# Patient Record
Sex: Female | Born: 1935 | Race: White | Hispanic: No | Marital: Married | Smoking: Former smoker
Health system: Southern US, Community
[De-identification: ages and names within clinical notes are randomized; demographics above are authoritative.]

## PROBLEM LIST (undated history)

## (undated) DIAGNOSIS — D649 Anemia, unspecified: Secondary | ICD-10-CM

## (undated) DIAGNOSIS — J9611 Chronic respiratory failure with hypoxia: Secondary | ICD-10-CM

## (undated) DIAGNOSIS — N183 Chronic kidney disease, stage 3 unspecified: Secondary | ICD-10-CM

## (undated) DIAGNOSIS — E559 Vitamin D deficiency, unspecified: Secondary | ICD-10-CM

## (undated) DIAGNOSIS — H3530 Unspecified macular degeneration: Secondary | ICD-10-CM

## (undated) DIAGNOSIS — N189 Chronic kidney disease, unspecified: Secondary | ICD-10-CM

## (undated) DIAGNOSIS — J90 Pleural effusion, not elsewhere classified: Secondary | ICD-10-CM

## (undated) DIAGNOSIS — I272 Pulmonary hypertension, unspecified: Secondary | ICD-10-CM

## (undated) DIAGNOSIS — I5032 Chronic diastolic (congestive) heart failure: Secondary | ICD-10-CM

## (undated) DIAGNOSIS — M1990 Unspecified osteoarthritis, unspecified site: Secondary | ICD-10-CM

## (undated) DIAGNOSIS — E785 Hyperlipidemia, unspecified: Secondary | ICD-10-CM

## (undated) DIAGNOSIS — I119 Hypertensive heart disease without heart failure: Secondary | ICD-10-CM

## (undated) DIAGNOSIS — E039 Hypothyroidism, unspecified: Secondary | ICD-10-CM

## (undated) DIAGNOSIS — Z8673 Personal history of transient ischemic attack (TIA), and cerebral infarction without residual deficits: Secondary | ICD-10-CM

## (undated) DIAGNOSIS — I050 Rheumatic mitral stenosis: Secondary | ICD-10-CM

## (undated) DIAGNOSIS — D631 Anemia in chronic kidney disease: Secondary | ICD-10-CM

## (undated) DIAGNOSIS — I639 Cerebral infarction, unspecified: Secondary | ICD-10-CM

## (undated) DIAGNOSIS — H5440 Blindness, one eye, unspecified eye: Secondary | ICD-10-CM

## (undated) DIAGNOSIS — N390 Urinary tract infection, site not specified: Secondary | ICD-10-CM

## (undated) HISTORY — PX: CATARACT EXTRACTION, BILATERAL: SHX1313

## (undated) HISTORY — DX: Vitamin D deficiency, unspecified: E55.9

## (undated) HISTORY — DX: Anemia, unspecified: D64.9

## (undated) HISTORY — DX: Hyperlipidemia, unspecified: E78.5

## (undated) HISTORY — DX: Hypothyroidism, unspecified: E03.9

---

## 2004-10-06 ENCOUNTER — Encounter: Admission: RE | Admit: 2004-10-06 | Discharge: 2004-10-06 | Payer: Self-pay | Admitting: Endocrinology

## 2004-10-21 ENCOUNTER — Encounter: Admission: RE | Admit: 2004-10-21 | Discharge: 2004-10-21 | Payer: Self-pay | Admitting: Endocrinology

## 2005-01-18 ENCOUNTER — Ambulatory Visit (HOSPITAL_COMMUNITY): Admission: RE | Admit: 2005-01-18 | Discharge: 2005-01-18 | Payer: Self-pay | Admitting: Endocrinology

## 2005-05-14 ENCOUNTER — Emergency Department (HOSPITAL_COMMUNITY): Admission: EM | Admit: 2005-05-14 | Discharge: 2005-05-15 | Payer: Self-pay | Admitting: Family Medicine

## 2006-05-29 ENCOUNTER — Encounter: Admission: RE | Admit: 2006-05-29 | Discharge: 2006-05-29 | Payer: Self-pay | Admitting: Endocrinology

## 2006-12-30 ENCOUNTER — Encounter
Admission: RE | Admit: 2006-12-30 | Discharge: 2006-12-30 | Payer: Self-pay | Admitting: Physical Medicine and Rehabilitation

## 2007-07-02 ENCOUNTER — Inpatient Hospital Stay (HOSPITAL_COMMUNITY): Admission: EM | Admit: 2007-07-02 | Discharge: 2007-07-03 | Payer: Self-pay | Admitting: Emergency Medicine

## 2007-07-02 ENCOUNTER — Ambulatory Visit: Payer: Self-pay | Admitting: Cardiology

## 2007-07-03 ENCOUNTER — Encounter (INDEPENDENT_AMBULATORY_CARE_PROVIDER_SITE_OTHER): Payer: Self-pay | Admitting: Neurology

## 2007-07-03 ENCOUNTER — Ambulatory Visit: Payer: Self-pay | Admitting: Vascular Surgery

## 2007-10-04 ENCOUNTER — Encounter: Admission: RE | Admit: 2007-10-04 | Discharge: 2007-10-04 | Payer: Self-pay | Admitting: Endocrinology

## 2007-10-28 ENCOUNTER — Encounter (INDEPENDENT_AMBULATORY_CARE_PROVIDER_SITE_OTHER): Payer: Self-pay | Admitting: Specialist

## 2007-10-28 ENCOUNTER — Ambulatory Visit: Payer: Self-pay | Admitting: Vascular Surgery

## 2007-10-28 ENCOUNTER — Ambulatory Visit (HOSPITAL_COMMUNITY): Admission: RE | Admit: 2007-10-28 | Discharge: 2007-10-28 | Payer: Self-pay | Admitting: Specialist

## 2007-11-02 ENCOUNTER — Emergency Department (HOSPITAL_COMMUNITY): Admission: EM | Admit: 2007-11-02 | Discharge: 2007-11-02 | Payer: Self-pay | Admitting: Emergency Medicine

## 2008-07-12 ENCOUNTER — Ambulatory Visit: Payer: Self-pay | Admitting: Internal Medicine

## 2008-07-12 ENCOUNTER — Inpatient Hospital Stay (HOSPITAL_COMMUNITY): Admission: EM | Admit: 2008-07-12 | Discharge: 2008-07-20 | Payer: Self-pay | Admitting: Emergency Medicine

## 2008-07-12 ENCOUNTER — Encounter: Payer: Self-pay | Admitting: Emergency Medicine

## 2008-10-26 ENCOUNTER — Encounter: Admission: RE | Admit: 2008-10-26 | Discharge: 2008-10-26 | Payer: Self-pay | Admitting: Endocrinology

## 2008-11-12 ENCOUNTER — Inpatient Hospital Stay (HOSPITAL_COMMUNITY): Admission: EM | Admit: 2008-11-12 | Discharge: 2008-11-25 | Payer: Self-pay | Admitting: Emergency Medicine

## 2008-11-12 ENCOUNTER — Ambulatory Visit: Payer: Self-pay | Admitting: Vascular Surgery

## 2008-11-13 ENCOUNTER — Encounter (INDEPENDENT_AMBULATORY_CARE_PROVIDER_SITE_OTHER): Payer: Self-pay | Admitting: Internal Medicine

## 2009-07-16 ENCOUNTER — Ambulatory Visit (HOSPITAL_COMMUNITY): Admission: RE | Admit: 2009-07-16 | Discharge: 2009-07-16 | Payer: Self-pay | Admitting: Endocrinology

## 2009-10-27 ENCOUNTER — Encounter: Admission: RE | Admit: 2009-10-27 | Discharge: 2009-10-27 | Payer: Self-pay | Admitting: Endocrinology

## 2009-11-02 ENCOUNTER — Encounter: Admission: RE | Admit: 2009-11-02 | Discharge: 2009-11-02 | Payer: Self-pay | Admitting: Endocrinology

## 2010-08-21 LAB — GLUCOSE, CAPILLARY
Glucose-Capillary: 100 mg/dL — ABNORMAL HIGH (ref 70–99)
Glucose-Capillary: 102 mg/dL — ABNORMAL HIGH (ref 70–99)
Glucose-Capillary: 106 mg/dL — ABNORMAL HIGH (ref 70–99)
Glucose-Capillary: 106 mg/dL — ABNORMAL HIGH (ref 70–99)
Glucose-Capillary: 107 mg/dL — ABNORMAL HIGH (ref 70–99)
Glucose-Capillary: 107 mg/dL — ABNORMAL HIGH (ref 70–99)
Glucose-Capillary: 108 mg/dL — ABNORMAL HIGH (ref 70–99)
Glucose-Capillary: 114 mg/dL — ABNORMAL HIGH (ref 70–99)
Glucose-Capillary: 116 mg/dL — ABNORMAL HIGH (ref 70–99)
Glucose-Capillary: 118 mg/dL — ABNORMAL HIGH (ref 70–99)
Glucose-Capillary: 119 mg/dL — ABNORMAL HIGH (ref 70–99)
Glucose-Capillary: 124 mg/dL — ABNORMAL HIGH (ref 70–99)
Glucose-Capillary: 137 mg/dL — ABNORMAL HIGH (ref 70–99)
Glucose-Capillary: 144 mg/dL — ABNORMAL HIGH (ref 70–99)
Glucose-Capillary: 146 mg/dL — ABNORMAL HIGH (ref 70–99)
Glucose-Capillary: 158 mg/dL — ABNORMAL HIGH (ref 70–99)
Glucose-Capillary: 164 mg/dL — ABNORMAL HIGH (ref 70–99)
Glucose-Capillary: 167 mg/dL — ABNORMAL HIGH (ref 70–99)
Glucose-Capillary: 67 mg/dL — ABNORMAL LOW (ref 70–99)
Glucose-Capillary: 70 mg/dL (ref 70–99)
Glucose-Capillary: 71 mg/dL (ref 70–99)
Glucose-Capillary: 72 mg/dL (ref 70–99)
Glucose-Capillary: 74 mg/dL (ref 70–99)
Glucose-Capillary: 75 mg/dL (ref 70–99)
Glucose-Capillary: 76 mg/dL (ref 70–99)
Glucose-Capillary: 80 mg/dL (ref 70–99)
Glucose-Capillary: 80 mg/dL (ref 70–99)
Glucose-Capillary: 81 mg/dL (ref 70–99)
Glucose-Capillary: 81 mg/dL (ref 70–99)
Glucose-Capillary: 82 mg/dL (ref 70–99)
Glucose-Capillary: 83 mg/dL (ref 70–99)
Glucose-Capillary: 87 mg/dL (ref 70–99)
Glucose-Capillary: 93 mg/dL (ref 70–99)
Glucose-Capillary: 96 mg/dL (ref 70–99)
Glucose-Capillary: 99 mg/dL (ref 70–99)

## 2010-08-21 LAB — CBC
HCT: 26.3 % — ABNORMAL LOW (ref 36.0–46.0)
HCT: 26.3 % — ABNORMAL LOW (ref 36.0–46.0)
HCT: 28.1 % — ABNORMAL LOW (ref 36.0–46.0)
HCT: 30.2 % — ABNORMAL LOW (ref 36.0–46.0)
Hemoglobin: 10 g/dL — ABNORMAL LOW (ref 12.0–15.0)
Hemoglobin: 8.3 g/dL — ABNORMAL LOW (ref 12.0–15.0)
Hemoglobin: 8.6 g/dL — ABNORMAL LOW (ref 12.0–15.0)
Hemoglobin: 8.7 g/dL — ABNORMAL LOW (ref 12.0–15.0)
Hemoglobin: 8.9 g/dL — ABNORMAL LOW (ref 12.0–15.0)
Hemoglobin: 9.6 g/dL — ABNORMAL LOW (ref 12.0–15.0)
MCHC: 32.7 g/dL (ref 30.0–36.0)
MCHC: 32.8 g/dL (ref 30.0–36.0)
MCHC: 32.8 g/dL (ref 30.0–36.0)
MCHC: 32.9 g/dL (ref 30.0–36.0)
MCHC: 33 g/dL (ref 30.0–36.0)
MCHC: 33 g/dL (ref 30.0–36.0)
MCHC: 33 g/dL (ref 30.0–36.0)
MCHC: 33.2 g/dL (ref 30.0–36.0)
MCHC: 33.2 g/dL (ref 30.0–36.0)
MCV: 95.4 fL (ref 78.0–100.0)
MCV: 95.5 fL (ref 78.0–100.0)
MCV: 95.6 fL (ref 78.0–100.0)
MCV: 95.8 fL (ref 78.0–100.0)
MCV: 96.4 fL (ref 78.0–100.0)
MCV: 96.7 fL (ref 78.0–100.0)
MCV: 96.7 fL (ref 78.0–100.0)
MCV: 97 fL (ref 78.0–100.0)
MCV: 97.1 fL (ref 78.0–100.0)
MCV: 97.3 fL (ref 78.0–100.0)
MCV: 97.8 fL (ref 78.0–100.0)
MCV: 97.8 fL (ref 78.0–100.0)
Platelets: 176 10*3/uL (ref 150–400)
Platelets: 185 10*3/uL (ref 150–400)
Platelets: 189 10*3/uL (ref 150–400)
Platelets: 198 10*3/uL (ref 150–400)
Platelets: 231 10*3/uL (ref 150–400)
Platelets: ADEQUATE 10*3/uL (ref 150–400)
RBC: 2.51 MIL/uL — ABNORMAL LOW (ref 3.87–5.11)
RBC: 2.59 MIL/uL — ABNORMAL LOW (ref 3.87–5.11)
RBC: 2.6 MIL/uL — ABNORMAL LOW (ref 3.87–5.11)
RBC: 2.68 MIL/uL — ABNORMAL LOW (ref 3.87–5.11)
RBC: 2.69 MIL/uL — ABNORMAL LOW (ref 3.87–5.11)
RBC: 2.73 MIL/uL — ABNORMAL LOW (ref 3.87–5.11)
RBC: 2.8 MIL/uL — ABNORMAL LOW (ref 3.87–5.11)
RBC: 2.84 MIL/uL — ABNORMAL LOW (ref 3.87–5.11)
RBC: 3.17 MIL/uL — ABNORMAL LOW (ref 3.87–5.11)
RDW: 16.9 % — ABNORMAL HIGH (ref 11.5–15.5)
RDW: 17.3 % — ABNORMAL HIGH (ref 11.5–15.5)
RDW: 17.4 % — ABNORMAL HIGH (ref 11.5–15.5)
RDW: 17.5 % — ABNORMAL HIGH (ref 11.5–15.5)
RDW: 17.6 % — ABNORMAL HIGH (ref 11.5–15.5)
RDW: 17.7 % — ABNORMAL HIGH (ref 11.5–15.5)
RDW: 17.8 % — ABNORMAL HIGH (ref 11.5–15.5)
WBC: 4.4 10*3/uL (ref 4.0–10.5)
WBC: 4.9 10*3/uL (ref 4.0–10.5)
WBC: 5.2 10*3/uL (ref 4.0–10.5)
WBC: 5.7 10*3/uL (ref 4.0–10.5)
WBC: 5.9 10*3/uL (ref 4.0–10.5)
WBC: 6 10*3/uL (ref 4.0–10.5)
WBC: 6.6 10*3/uL (ref 4.0–10.5)

## 2010-08-21 LAB — DIFFERENTIAL
Basophils Absolute: 0 10*3/uL (ref 0.0–0.1)
Basophils Absolute: 0 10*3/uL (ref 0.0–0.1)
Basophils Relative: 1 % (ref 0–1)
Basophils Relative: 1 % (ref 0–1)
Eosinophils Absolute: 0 10*3/uL (ref 0.0–0.7)
Eosinophils Absolute: 0.3 10*3/uL (ref 0.0–0.7)
Eosinophils Relative: 6 % — ABNORMAL HIGH (ref 0–5)
Lymphocytes Relative: 14 % (ref 12–46)
Lymphocytes Relative: 23 % (ref 12–46)
Lymphs Abs: 0.7 10*3/uL (ref 0.7–4.0)
Monocytes Absolute: 0.9 10*3/uL (ref 0.1–1.0)
Monocytes Relative: 18 % — ABNORMAL HIGH (ref 3–12)
Neutro Abs: 3 10*3/uL (ref 1.7–7.7)
Neutrophils Relative %: 62 % (ref 43–77)
Neutrophils Relative %: 71 % (ref 43–77)

## 2010-08-21 LAB — BLOOD GAS, ARTERIAL
Acid-Base Excess: 3.3 mmol/L — ABNORMAL HIGH (ref 0.0–2.0)
Acid-Base Excess: 7.6 mmol/L — ABNORMAL HIGH (ref 0.0–2.0)
Bicarbonate: 31.1 mEq/L — ABNORMAL HIGH (ref 20.0–24.0)
Drawn by: 103701
Drawn by: 295031
FIO2: 0.21 %
FIO2: 0.35 %
FIO2: 0.35 %
O2 Saturation: 86.2 %
O2 Saturation: 96.5 %
Patient temperature: 98.6
TCO2: 29.6 mmol/L (ref 0–100)
TCO2: 31.9 mmol/L (ref 0–100)
pCO2 arterial: 55.5 mmHg — ABNORMAL HIGH (ref 35.0–45.0)
pCO2 arterial: 56.5 mmHg — ABNORMAL HIGH (ref 35.0–45.0)
pCO2 arterial: 59.9 mmHg (ref 35.0–45.0)
pH, Arterial: 7.299 — ABNORMAL LOW (ref 7.350–7.400)
pO2, Arterial: 113 mmHg — ABNORMAL HIGH (ref 80.0–100.0)
pO2, Arterial: 79.1 mmHg — ABNORMAL LOW (ref 80.0–100.0)
pO2, Arterial: 80.1 mmHg (ref 80.0–100.0)

## 2010-08-21 LAB — BASIC METABOLIC PANEL
BUN: 19 mg/dL (ref 6–23)
BUN: 19 mg/dL (ref 6–23)
BUN: 19 mg/dL (ref 6–23)
BUN: 20 mg/dL (ref 6–23)
BUN: 20 mg/dL (ref 6–23)
BUN: 21 mg/dL (ref 6–23)
BUN: 21 mg/dL (ref 6–23)
BUN: 23 mg/dL (ref 6–23)
BUN: 26 mg/dL — ABNORMAL HIGH (ref 6–23)
BUN: 29 mg/dL — ABNORMAL HIGH (ref 6–23)
CO2: 25 mEq/L (ref 19–32)
CO2: 27 mEq/L (ref 19–32)
CO2: 28 mEq/L (ref 19–32)
CO2: 28 mEq/L (ref 19–32)
CO2: 29 mEq/L (ref 19–32)
CO2: 29 mEq/L (ref 19–32)
CO2: 30 mEq/L (ref 19–32)
CO2: 31 mEq/L (ref 19–32)
CO2: 31 mEq/L (ref 19–32)
Calcium: 8.8 mg/dL (ref 8.4–10.5)
Calcium: 8.8 mg/dL (ref 8.4–10.5)
Calcium: 9 mg/dL (ref 8.4–10.5)
Calcium: 9.2 mg/dL (ref 8.4–10.5)
Calcium: 9.2 mg/dL (ref 8.4–10.5)
Calcium: 9.3 mg/dL (ref 8.4–10.5)
Calcium: 9.3 mg/dL (ref 8.4–10.5)
Calcium: 9.4 mg/dL (ref 8.4–10.5)
Chloride: 104 mEq/L (ref 96–112)
Chloride: 105 mEq/L (ref 96–112)
Chloride: 105 mEq/L (ref 96–112)
Chloride: 105 mEq/L (ref 96–112)
Chloride: 106 mEq/L (ref 96–112)
Chloride: 107 mEq/L (ref 96–112)
Chloride: 107 mEq/L (ref 96–112)
Chloride: 107 mEq/L (ref 96–112)
Chloride: 107 mEq/L (ref 96–112)
Chloride: 109 mEq/L (ref 96–112)
Chloride: 109 mEq/L (ref 96–112)
Creatinine, Ser: 1.05 mg/dL (ref 0.4–1.2)
Creatinine, Ser: 1.11 mg/dL (ref 0.4–1.2)
Creatinine, Ser: 1.16 mg/dL (ref 0.4–1.2)
Creatinine, Ser: 1.29 mg/dL — ABNORMAL HIGH (ref 0.4–1.2)
Creatinine, Ser: 1.31 mg/dL — ABNORMAL HIGH (ref 0.4–1.2)
Creatinine, Ser: 1.33 mg/dL — ABNORMAL HIGH (ref 0.4–1.2)
Creatinine, Ser: 1.33 mg/dL — ABNORMAL HIGH (ref 0.4–1.2)
Creatinine, Ser: 1.34 mg/dL — ABNORMAL HIGH (ref 0.4–1.2)
Creatinine, Ser: 1.35 mg/dL — ABNORMAL HIGH (ref 0.4–1.2)
Creatinine, Ser: 1.36 mg/dL — ABNORMAL HIGH (ref 0.4–1.2)
Creatinine, Ser: 1.39 mg/dL — ABNORMAL HIGH (ref 0.4–1.2)
GFR calc Af Amer: 44 mL/min — ABNORMAL LOW (ref >60)
GFR calc Af Amer: 45 mL/min — ABNORMAL LOW (ref >60)
GFR calc Af Amer: 46 mL/min — ABNORMAL LOW (ref >60)
GFR calc Af Amer: 46 mL/min — ABNORMAL LOW (ref >60)
GFR calc Af Amer: 48 mL/min — ABNORMAL LOW (ref >60)
GFR calc Af Amer: 48 mL/min — ABNORMAL LOW (ref >60)
GFR calc Af Amer: 57 mL/min — ABNORMAL LOW (ref >60)
GFR calc Af Amer: >60 mL/min (ref >60)
GFR calc non Af Amer: 36 mL/min — ABNORMAL LOW (ref >60)
GFR calc non Af Amer: 37 mL/min — ABNORMAL LOW (ref >60)
GFR calc non Af Amer: 38 mL/min — ABNORMAL LOW (ref >60)
GFR calc non Af Amer: 38 mL/min — ABNORMAL LOW (ref >60)
GFR calc non Af Amer: 39 mL/min — ABNORMAL LOW (ref >60)
GFR calc non Af Amer: 40 mL/min — ABNORMAL LOW (ref >60)
GFR calc non Af Amer: 47 mL/min — ABNORMAL LOW (ref >60)
GFR calc non Af Amer: 50 mL/min — ABNORMAL LOW (ref >60)
Glucose, Bld: 110 mg/dL — ABNORMAL HIGH (ref 70–99)
Glucose, Bld: 114 mg/dL — ABNORMAL HIGH (ref 70–99)
Glucose, Bld: 119 mg/dL — ABNORMAL HIGH (ref 70–99)
Glucose, Bld: 120 mg/dL — ABNORMAL HIGH (ref 70–99)
Glucose, Bld: 125 mg/dL — ABNORMAL HIGH (ref 70–99)
Glucose, Bld: 73 mg/dL (ref 70–99)
Glucose, Bld: 80 mg/dL (ref 70–99)
Glucose, Bld: 95 mg/dL (ref 70–99)
Glucose, Bld: 96 mg/dL (ref 70–99)
Glucose, Bld: 99 mg/dL (ref 70–99)
Potassium: 3.5 mEq/L (ref 3.5–5.1)
Potassium: 3.6 mEq/L (ref 3.5–5.1)
Potassium: 3.6 mEq/L (ref 3.5–5.1)
Potassium: 3.8 mEq/L (ref 3.5–5.1)
Potassium: 3.9 mEq/L (ref 3.5–5.1)
Potassium: 4.1 mEq/L (ref 3.5–5.1)
Potassium: 4.7 mEq/L (ref 3.5–5.1)
Sodium: 139 mEq/L (ref 135–145)
Sodium: 140 mEq/L (ref 135–145)
Sodium: 141 mEq/L (ref 135–145)
Sodium: 142 mEq/L (ref 135–145)
Sodium: 142 mEq/L (ref 135–145)
Sodium: 143 mEq/L (ref 135–145)
Sodium: 144 mEq/L (ref 135–145)
Sodium: 144 mEq/L (ref 135–145)

## 2010-08-21 LAB — CARDIAC PANEL(CRET KIN+CKTOT+MB+TROPI)
CK, MB: 4.9 ng/mL — ABNORMAL HIGH (ref 0.3–4.0)
CK, MB: 5.3 ng/mL — ABNORMAL HIGH (ref 0.3–4.0)
Relative Index: INVALID (ref 0.0–2.5)
Total CK: 35 U/L (ref 7–177)
Troponin I: 0.01 ng/mL (ref 0.00–0.06)
Troponin I: 0.03 ng/mL (ref 0.00–0.06)

## 2010-08-21 LAB — CULTURE, BLOOD (ROUTINE X 2)
Culture: NO GROWTH
Culture: NO GROWTH
Culture: NO GROWTH
Culture: NO GROWTH

## 2010-08-21 LAB — URINALYSIS, ROUTINE W REFLEX MICROSCOPIC
Glucose, UA: NEGATIVE mg/dL
Hgb urine dipstick: NEGATIVE
Ketones, ur: NEGATIVE mg/dL
Protein, ur: NEGATIVE mg/dL

## 2010-08-21 LAB — URINE MICROSCOPIC-ADD ON

## 2010-08-21 LAB — COMPREHENSIVE METABOLIC PANEL
ALT: 33 U/L (ref 0–35)
AST: 50 U/L — ABNORMAL HIGH (ref 0–37)
Albumin: 3.2 g/dL — ABNORMAL LOW (ref 3.5–5.2)
Albumin: 3.2 g/dL — ABNORMAL LOW (ref 3.5–5.2)
BUN: 44 mg/dL — ABNORMAL HIGH (ref 6–23)
Calcium: 9.6 mg/dL (ref 8.4–10.5)
Chloride: 104 mEq/L (ref 96–112)
Creatinine, Ser: 1.13 mg/dL (ref 0.4–1.2)
Creatinine, Ser: 1.21 mg/dL — ABNORMAL HIGH (ref 0.4–1.2)
GFR calc Af Amer: 52 mL/min — ABNORMAL LOW (ref >60)
Potassium: 4.7 mEq/L (ref 3.5–5.1)
Sodium: 146 mEq/L — ABNORMAL HIGH (ref 135–145)
Total Bilirubin: 0.5 mg/dL (ref 0.3–1.2)
Total Protein: 6.5 g/dL (ref 6.0–8.3)

## 2010-08-21 LAB — FOLATE
Folate: >20 ng/mL
Folate: >20 ng/mL

## 2010-08-21 LAB — URINE CULTURE: Colony Count: 15000

## 2010-08-21 LAB — IRON AND TIBC
Iron: 43 ug/dL (ref 42–135)
Saturation Ratios: 16 % — ABNORMAL LOW (ref 20–55)
TIBC: 264 ug/dL (ref 250–470)
UIBC: 221 ug/dL

## 2010-08-21 LAB — RETICULOCYTES
Retic Count, Absolute: 47.3 10*3/uL (ref 19.0–186.0)
Retic Ct Pct: 1.8 % (ref 0.4–3.1)

## 2010-08-21 LAB — BRAIN NATRIURETIC PEPTIDE: Pro B Natriuretic peptide (BNP): 403 pg/mL — ABNORMAL HIGH (ref 0.0–100.0)

## 2010-08-21 LAB — HEMOGLOBIN A1C: Mean Plasma Glucose: 131 mg/dL

## 2010-08-21 LAB — FERRITIN: Ferritin: 297 ng/mL — ABNORMAL HIGH (ref 10–291)

## 2010-08-21 LAB — CK TOTAL AND CKMB (NOT AT ARMC): CK, MB: 5.4 ng/mL — ABNORMAL HIGH (ref 0.3–4.0)

## 2010-08-21 LAB — VITAMIN B12: Vitamin B-12: 615 pg/mL (ref 211–911)

## 2010-08-21 LAB — HEMOCCULT GUIAC POC 1CARD (OFFICE): Fecal Occult Bld: POSITIVE

## 2010-08-21 LAB — TSH
TSH: 6.988 u[IU]/mL — ABNORMAL HIGH (ref 0.350–4.500)
TSH: 8.225 u[IU]/mL — ABNORMAL HIGH (ref 0.350–4.500)

## 2010-08-21 LAB — T4, FREE: Free T4: 1.21 ng/dL (ref 0.80–1.80)

## 2010-08-21 LAB — TECHNOLOGIST SMEAR REVIEW

## 2010-08-21 LAB — PROTIME-INR
INR: 1 (ref 0.00–1.49)
Prothrombin Time: 13.1 seconds (ref 11.6–15.2)

## 2010-08-21 LAB — T3: T3, Total: 108.7 ng/dl (ref 80.0–204.0)

## 2010-08-21 LAB — APTT
aPTT: 40 seconds — ABNORMAL HIGH (ref 24–37)
aPTT: 41 seconds — ABNORMAL HIGH (ref 24–37)

## 2010-08-21 LAB — LIPASE, BLOOD: Lipase: 125 U/L — ABNORMAL HIGH (ref 11–59)

## 2010-08-25 LAB — COMPREHENSIVE METABOLIC PANEL
ALT: 19 U/L (ref 0–35)
AST: 36 U/L (ref 0–37)
Alkaline Phosphatase: 111 U/L (ref 39–117)
BUN: 20 mg/dL (ref 6–23)
CO2: 22 mEq/L (ref 19–32)
CO2: 22 mEq/L (ref 19–32)
Calcium: 8.6 mg/dL (ref 8.4–10.5)
Chloride: 116 mEq/L — ABNORMAL HIGH (ref 96–112)
Chloride: 118 mEq/L — ABNORMAL HIGH (ref 96–112)
Creatinine, Ser: 1.17 mg/dL (ref 0.4–1.2)
Creatinine, Ser: 1.19 mg/dL (ref 0.4–1.2)
GFR calc Af Amer: 54 mL/min — ABNORMAL LOW (ref >60)
GFR calc non Af Amer: 44 mL/min — ABNORMAL LOW (ref >60)
GFR calc non Af Amer: 44 mL/min — ABNORMAL LOW (ref >60)
Glucose, Bld: 96 mg/dL (ref 70–99)
Potassium: 3.9 mEq/L (ref 3.5–5.1)
Total Bilirubin: 0.4 mg/dL (ref 0.3–1.2)
Total Bilirubin: 0.5 mg/dL (ref 0.3–1.2)

## 2010-08-25 LAB — CBC
HCT: 27.9 % — ABNORMAL LOW (ref 36.0–46.0)
HCT: 28.6 % — ABNORMAL LOW (ref 36.0–46.0)
Hemoglobin: 9.7 g/dL — ABNORMAL LOW (ref 12.0–15.0)
MCHC: 34.1 g/dL (ref 30.0–36.0)
MCV: 95.2 fL (ref 78.0–100.0)
MCV: 96.2 fL (ref 78.0–100.0)
RBC: 2.93 MIL/uL — ABNORMAL LOW (ref 3.87–5.11)
RBC: 2.97 MIL/uL — ABNORMAL LOW (ref 3.87–5.11)
WBC: 11.1 10*3/uL — ABNORMAL HIGH (ref 4.0–10.5)
WBC: 9.9 10*3/uL (ref 4.0–10.5)

## 2010-08-25 LAB — GLUCOSE, CAPILLARY
Glucose-Capillary: 103 mg/dL — ABNORMAL HIGH (ref 70–99)
Glucose-Capillary: 108 mg/dL — ABNORMAL HIGH (ref 70–99)
Glucose-Capillary: 178 mg/dL — ABNORMAL HIGH (ref 70–99)
Glucose-Capillary: 28 mg/dL — CL (ref 70–99)
Glucose-Capillary: 57 mg/dL — ABNORMAL LOW (ref 70–99)
Glucose-Capillary: 61 mg/dL — ABNORMAL LOW (ref 70–99)
Glucose-Capillary: 68 mg/dL — ABNORMAL LOW (ref 70–99)
Glucose-Capillary: 85 mg/dL (ref 70–99)
Glucose-Capillary: 89 mg/dL (ref 70–99)
Glucose-Capillary: 90 mg/dL (ref 70–99)
Glucose-Capillary: 93 mg/dL (ref 70–99)

## 2010-08-25 LAB — BRAIN NATRIURETIC PEPTIDE
Pro B Natriuretic peptide (BNP): 445 pg/mL — ABNORMAL HIGH (ref 0.0–100.0)
Pro B Natriuretic peptide (BNP): 575 pg/mL — ABNORMAL HIGH (ref 0.0–100.0)

## 2010-08-25 LAB — TRIGLYCERIDES: Triglycerides: 35 mg/dL (ref <150)

## 2010-08-25 LAB — BASIC METABOLIC PANEL
BUN: 13 mg/dL (ref 6–23)
BUN: 16 mg/dL (ref 6–23)
CO2: 22 mEq/L (ref 19–32)
CO2: 28 mEq/L (ref 19–32)
CO2: 33 mEq/L — ABNORMAL HIGH (ref 19–32)
Calcium: 8.5 mg/dL (ref 8.4–10.5)
Calcium: 8.7 mg/dL (ref 8.4–10.5)
Calcium: 9 mg/dL (ref 8.4–10.5)
Chloride: 104 mEq/L (ref 96–112)
Chloride: 115 mEq/L — ABNORMAL HIGH (ref 96–112)
Chloride: 116 mEq/L — ABNORMAL HIGH (ref 96–112)
Chloride: 98 mEq/L (ref 96–112)
Creatinine, Ser: 1.03 mg/dL (ref 0.4–1.2)
Creatinine, Ser: 1.06 mg/dL (ref 0.4–1.2)
Creatinine, Ser: 1.32 mg/dL — ABNORMAL HIGH (ref 0.4–1.2)
GFR calc Af Amer: 46 mL/min — ABNORMAL LOW (ref >60)
GFR calc Af Amer: 47 mL/min — ABNORMAL LOW (ref >60)
GFR calc Af Amer: >60 mL/min (ref >60)
GFR calc Af Amer: >60 mL/min (ref >60)
GFR calc non Af Amer: 35 mL/min — ABNORMAL LOW (ref >60)
Glucose, Bld: 33 mg/dL — CL (ref 70–99)
Glucose, Bld: 64 mg/dL — ABNORMAL LOW (ref 70–99)
Potassium: 3.9 mEq/L (ref 3.5–5.1)
Potassium: 5.2 mEq/L — ABNORMAL HIGH (ref 3.5–5.1)
Sodium: 142 mEq/L (ref 135–145)
Sodium: 142 mEq/L (ref 135–145)

## 2010-08-25 LAB — AMYLASE: Amylase: 645 U/L — ABNORMAL HIGH (ref 27–131)

## 2010-08-25 LAB — LIPASE, BLOOD
Lipase: 1977 U/L — ABNORMAL HIGH (ref 11–59)
Lipase: 699 U/L — ABNORMAL HIGH (ref 11–59)
Lipase: 815 U/L — ABNORMAL HIGH (ref 11–59)

## 2010-08-25 LAB — TSH: TSH: 2.857 u[IU]/mL (ref 0.350–4.500)

## 2010-08-30 LAB — CARDIAC PANEL(CRET KIN+CKTOT+MB+TROPI)
CK, MB: 2.2 ng/mL (ref 0.3–4.0)
Relative Index: INVALID (ref 0.0–2.5)
Total CK: 27 U/L (ref 7–177)
Troponin I: <0.01 ng/mL (ref 0.00–0.06)

## 2010-08-30 LAB — CBC
HCT: 36.5 % (ref 36.0–46.0)
Hemoglobin: 12 g/dL (ref 12.0–15.0)
Hemoglobin: 12.5 g/dL (ref 12.0–15.0)
MCHC: 34.1 g/dL (ref 30.0–36.0)
MCV: 95.2 fL (ref 78.0–100.0)
Platelets: 202 10*3/uL (ref 150–400)
Platelets: 213 10*3/uL (ref 150–400)
RBC: 3.64 MIL/uL — ABNORMAL LOW (ref 3.87–5.11)
RDW: 14.4 % (ref 11.5–15.5)
RDW: 14.6 % (ref 11.5–15.5)

## 2010-08-30 LAB — COMPREHENSIVE METABOLIC PANEL
ALT: 18 U/L (ref 0–35)
Albumin: 2.9 g/dL — ABNORMAL LOW (ref 3.5–5.2)
Albumin: 3.2 g/dL — ABNORMAL LOW (ref 3.5–5.2)
Alkaline Phosphatase: 88 U/L (ref 39–117)
Alkaline Phosphatase: 96 U/L (ref 39–117)
BUN: 67 mg/dL — ABNORMAL HIGH (ref 6–23)
Calcium: 9.9 mg/dL (ref 8.4–10.5)
Creatinine, Ser: 1.23 mg/dL — ABNORMAL HIGH (ref 0.4–1.2)
GFR calc Af Amer: 40 mL/min — ABNORMAL LOW (ref >60)
Glucose, Bld: 69 mg/dL — ABNORMAL LOW (ref 70–99)
Potassium: 5.2 mEq/L — ABNORMAL HIGH (ref 3.5–5.1)
Potassium: 5.3 mEq/L — ABNORMAL HIGH (ref 3.5–5.1)
Sodium: 143 mEq/L (ref 135–145)
Total Protein: 6.1 g/dL (ref 6.0–8.3)
Total Protein: 6.3 g/dL (ref 6.0–8.3)

## 2010-08-30 LAB — APTT: aPTT: 35 seconds (ref 24–37)

## 2010-08-30 LAB — POCT I-STAT, CHEM 8
Calcium, Ion: 1.29 mmol/L (ref 1.12–1.32)
Glucose, Bld: 95 mg/dL (ref 70–99)
HCT: 37 % (ref 36.0–46.0)
Hemoglobin: 12.6 g/dL (ref 12.0–15.0)
Potassium: 5.2 mEq/L — ABNORMAL HIGH (ref 3.5–5.1)

## 2010-08-30 LAB — DIFFERENTIAL
Basophils Relative: 0 % (ref 0–1)
Basophils Relative: 0 % (ref 0–1)
Eosinophils Absolute: 0.1 10*3/uL (ref 0.0–0.7)
Lymphocytes Relative: 12 % (ref 12–46)
Lymphs Abs: 0.9 10*3/uL (ref 0.7–4.0)
Monocytes Absolute: 0.5 10*3/uL (ref 0.1–1.0)
Monocytes Relative: 5 % (ref 3–12)
Monocytes Relative: 6 % (ref 3–12)
Neutro Abs: 6.9 10*3/uL (ref 1.7–7.7)
Neutrophils Relative %: 82 % — ABNORMAL HIGH (ref 43–77)

## 2010-08-30 LAB — PROTIME-INR
INR: 0.9 (ref 0.00–1.49)
Prothrombin Time: 12.5 seconds (ref 11.6–15.2)

## 2010-08-30 LAB — POCT CARDIAC MARKERS
CKMB, poc: 1.7 ng/mL (ref 1.0–8.0)
CKMB, poc: 2.1 ng/mL (ref 1.0–8.0)
Troponin i, poc: <0.05 ng/mL (ref 0.00–0.09)

## 2010-08-30 LAB — CK TOTAL AND CKMB (NOT AT ARMC)
CK, MB: 2.4 ng/mL (ref 0.3–4.0)
Total CK: 21 U/L (ref 7–177)

## 2010-09-15 ENCOUNTER — Encounter (HOSPITAL_COMMUNITY): Payer: Self-pay

## 2010-09-23 ENCOUNTER — Encounter (HOSPITAL_COMMUNITY): Payer: Medicare Other | Attending: Endocrinology

## 2010-09-23 DIAGNOSIS — D509 Iron deficiency anemia, unspecified: Secondary | ICD-10-CM | POA: Insufficient documentation

## 2010-09-27 NOTE — Discharge Summary (Signed)
[name], [name] NO.:  [id number]   MEDICAL RECORD NO.:  [id number]          PATIENT TYPE:  INP   LOCATION:  1338                         FACILITY:  [facility]   PHYSICIAN:  Ramiro Harvest, MD    DATE OF BIRTH:  [birth year]   DATE OF ADMISSION:  11/12/2008  DATE OF DISCHARGE:  11/24/2008                               DISCHARGE SUMMARY   DIAGNOSES AT TIME OF DISCHARGE:  1. Pneumonia, status post 10 days of intravenous Unasyn.  Blood      cultures drawn November 22, 2008, remain no growth to date at time of      dictation.  2. Hypothermia, multifactorial in the setting of hypothyroid and acute      infection.  3. Coag-negative staph urinary tract infection, status post 10 days of      doxycycline.  4. Altered mental status secondary to metabolic encephalopathy in the      setting of hypothyroid and pneumonia.  5. Epistaxis, resolved.  6. Chronic diastolic heart failure.  7. Hypertension.  8. Chronic obstructive pulmonary disease.  9. Dysphagia.  Current recommendations are dysphagia-III mechanical      soft diet.  10.Iron-deficiency anemia.  11.Left thyroid nodule status post thyroid ultrasound this admission      with heterogeneous thyroid.   HISTORY OF PRESENT ILLNESS:  [name] is an 77 year old white female  who was admitted on November 12, 2008, with chief complaint of progressive  weakness and 3-week history of confusion.  She also had 2-day history of  nosebleeds.  Per the emergency department physician, while in the  emergency department on November 12, 2008, the patient experienced a period  of unresponsiveness when being helped to the bedside commode.  She had a  reported heart rate down to 42 during the event with unknown blood  pressure.  The episode lasted several seconds and resolved without any  intervention.  Patient's daughters report progressive weakness and  confusion x3 weeks requiring patient to use walker with maximum  assistance with ADLs.  She  was seen 3 weeks ago by her primary care  Bradon Fester at onset of symptoms at which time it was discovered that she  was not taking her Synthroid prescription as prescribed and her Cipro as  prescribed for urinary tract infection.  She was admitted for further  evaluation and treatment.   PAST MEDICAL HISTORY:  1. History of CVA.  2. Hypertension.  3. Macular degeneration.  4. Hard of hearing.  5. Pancreatitis felt secondary to ACE inhibitor, hospitalized February      2010.  6. Hyperlipidemia.  7. Hypothyroidism.   COURSE OF HOSPITALIZATION:  1. Pneumonia.  The patient was admitted and was started on IV Rocephin      empirically, as well as Zithromax.  These medications were      discontinued on November 14, 2008, and the patient was placed on IV      Unasyn and IV vancomycin.  Vancomycin was continued until November 18, 2008, at which time it was discontinued and the patient was  placed      on doxycycline IV.  At this time, the patient has a normal white      count and is afebrile.  She did have a temp of 99.4 right before      midnight last night.  She is status post a total of 10 days of      Unasyn and we will discontinue antibiotics today.  2. Multifactorial hypothermia felt secondary to pneumonia and      hypothyroidism.  This is resolved at this time.  The patient was      started back on her home Synthroid dose and apparently had not been      taking prior to admission.  Her TSH this admission was 6 and she      will need outpatient followup thyroid function tests in 4 to 6      weeks.  3. Coag-negative staph urinary tract infection.  Patient completed a      10-day course of doxycycline for this.  4. Altered mental status secondary to metabolic encephalopathy in the      setting of pneumonia and hypothyroidism.  The patient's mental      status is improving and appears to be approaching baseline.  5. Epistaxis.  This issue is currently resolved.  6. Chronic diastolic heart  failure.  The patient did undergo a 2D echo      during this admission which noted a normal LV function with a left      ventricular ejection fraction of 60% to 65%, did note diastolic      dysfunction, and increased right ventricular systolic pressure      which was consistent with moderate pulmonary hypertension.  The      patient notes that she was taking Lasix 20 mg every other day at      home in conjunction with 10 mEq of K-Dur.  This medication has been      held during this hospitalization.  The patient has been hydrated.      She appears to have a touch of a volume overload with bibasilar      crackles today and O2 saturation of 93% on room air therefore we      will saline lock her IV and resume her home Lasix.  She is      asymptomatic without shortness of breath at this time.  7. Hypertension.  This is currently stable.  We will continue home      Lopressor.  8. COPD.  The patient was briefly on Advair and p.r.n. nebs during      this admission and is currently stable and she was not on Advair      prior to admission therefore we will discontinue at this time.  9. Dysphagia.  The patient was seen by speech therapy during this      admission.  Her current diet recommendations are dysphagia-III      mechanical soft diet with thin liquids and aspiration precautions.  10.Iron-deficiency anemia.  We will continue oral iron      supplementation.  Patient did undergo an anemia panel during this      admission which noted an iron level of 43 and a normal B12 and      folate.  A fecal occult blood was positive on November 21, 1999.  Defer      further workup to patient's primary M.D.  This patient has no signs      of  acute GI bleed and remains hemodynamically stable.  11.Disposition.  At this time, PT has recommended home health versus      skilled nursing facility at time of discharge.  The patient's      family and the patient are agreeable to SNF.  Bed options have been      given to  the family today.  Anticipate patient will be medically      stable for discharge tomorrow.  The family is reviewing the      facilities today.   MEDICATIONS AT TIME OF DISCHARGE:  1. Levothyroxine 75 mcg p.o. daily.  2. Glucosamine maximum strength 1 tab p.o. b.i.d.  3. Folic acid 800 mcg p.o. daily in the evening.  4. Metoprolol 25 mg p.o. daily.  5. Simvastatin 40 mg p.o. daily.  6. Omeprazole 20 mg p.o. b.i.d.  7. Aggrenox 25/200 one tab p.o. b.i.d.  8. Robaxin 500 mg p.o. t.i.d. as needed.  9. Lasix 20 mg p.o. every other day in the morning.  10.Potassium 10 mEq p.o. daily with Lasix.  11.Multivitamin 1 tab p.o. daily.  12.Ocuvite 1 tab p.o. b.i.d.  13.Calcium 600 mg p.o. daily in the morning.  14.Iron 325 mg p.o. daily.  15.Lubricant eyedrops with no preservatives at least 4 times daily to      both eyes.  16.Systane to right eye at least twice daily.   PERTINENT LABORATORIES AT TIME OF DISCHARGE:  BUN 19, creatinine 1.34,  hemoglobin 8.7, hematocrit 26.3.  Blood cultures November 22, 2008, remain  no growth to date x2.  Blood cultures November 12, 2008, negative x2.  Urine  culture on November 12, 2008, with 15,000 colonies of coag-negative staph.  TSH 6.988.   DIET:  The patient is maintained on dysphagia-III mechanical soft diet.   She is to increase her activity slowly and walk with assistance.  She is  also instructed to follow up with Dr. [name] in 2 weeks.  She will need  followup thyroid function testing in 4 to 6 weeks.   Greater than 30 minutes spent on discharge planning.      Sandford Craze, NP      Ramiro Harvest, MD  Electronically Signed    MO/MEDQ  D:  11/24/2008  T:  11/24/2008  Job:  [id number]   cc:   Dr. Alyson Locket

## 2010-09-27 NOTE — Consult Note (Signed)
NAME:  [name], TROIANI NO.:  [id number]   MEDICAL RECORD NO.:  [id number]          PATIENT TYPE:  INP   LOCATION:  1222                         FACILITY:  [facility]   PHYSICIAN:  Noel Christmas, MD    DATE OF BIRTH:  09/04/1924   DATE OF CONSULTATION:  11/14/2008  DATE OF DISCHARGE:                                 CONSULTATION   REFERRING SERVICE:  [facility].   REASON FOR CONSULTATION:  Altered mental status of unclear etiology.   HISTORY OF PRESENT ILLNESS:  This is an 77 year old lady with altered  mental status of unclear etiology who presented to the emergency room 2  days ago with epistaxis, which was difficult to control.  Family gave a  history of the patient having increasing confusion over about 3 weeks.  She had gone from being functionally independent with activities of  daily living and ambulation without difficulty to using a walker with  needing assistance as well as progressive confusion.  No focal  abnormality was noted.  The patient has a history of an old right  thalamic stroke.  There is no indication of recurrent stroke.  CT scan  of her brain showed no acute intracranial abnormality on November 12, 2008.  Laboratory studies have shown azotemia, but otherwise was unremarkable  except for increased lipase and amylase.  The patient has a history of  previous pancreatitis.  Her chest x-ray has shown findings consistent  with pneumonia for which she is on antibiotic treatment.  Blood cultures  have been negative.  The patient is pancytopenic.  She has also been  hypothermic.  Her urine has grown coagulase-negative staph aureus.  Blood sugars have been unremarkable.  EEG, on yesterday showed  nonspecific generalized slowing with no indication of seizure activity.  The patient is on no sedating medications.   PAST MEDICAL HISTORY:  1. Old thalamic stroke on the right.  2. Hypertension.  3. Macular degeneration.  4. Pancreatitis.  5.  Hyperlipidemia.  6. Hypothyroidism.   CURRENT MEDICATIONS:  1. Synthroid 75 mcg per day.  2. Protonix 40 mg per day.  3. Albuterol 2.5 mg q.6 hours.  4. Advair 1 puff b.i.d.  5. Gentamicin eye drops 4 times a day.  6. Iron sulfate 325 mg per day.  7. Calcium carbonate.  8. Os-Cal 500 one per day.  9. Rocephin 1 g IV per day.  10.Zithromax 500 mg q.24 hours.  11.Zofran 4 mg q.4 hours p.r.n.  12.Unasyn.  13.Vancomycin per pharmacy protocols.   FAMILY HISTORY:  Noncontributory.   PHYSICAL EXAMINATION:  GENERAL:  Appearance was that of an elderly lady  of medium built, slight overweight.  She was breathing at a regular rate  at about 15 per minute with no signs of shortness of breath.  She  responded to tactile and verbal stimulation with eye opening only.  There was no indication of any conscious activity.  HEENT:  Pupils were asymmetric in size with arch pupil on the right.  The patient also has dense cataract on the right.  Right pupil did  not  react to light.  Left pupil reacted sluggishly to light.  Her  extraocular movements were intact with oculocephalic maneuvers of her  head.  There was no reaction to visual threat.  She had no facial  asymmetry.  MUSCULOSKELETAL:  Muscle tone was flaccid throughout.  She had no  spontaneous movements nor withdrawal movements of her extremities to  noxious stimulation.  Deep tendon reflexes are 1+ in upper extremities  and absent in lower extremities.  Plantar responses were made.   CLINICAL IMPRESSION:  1. Obtunded state of unclear etiology, but most likely secondary to      multiple factors including infectious process with pneumonia and      also UTI, hypothermia as well as hypercarbia, and azotemia.  2. No indications of seizure activity.  Clinically, normal per EEG      yesterday.  3. No signs of recurrent stroke.   RECOMMENDATIONS:  1. Repeat CT of her head to rule out an interval change and      intracranial findings  including rule out acute intracranial      hemorrhage as well as rule out any signs of increased intracranial      pressure or new stroke.  2. No change in current medications and supportive measures were      recommended.  3. We will follow up after CT results are available.   Thank you for asking me to evaluate Ms. Caputi.      Noel Christmas, MD  Electronically Signed     CS/MEDQ  D:  11/14/2008  T:  11/15/2008  Job:  [id number]

## 2010-09-27 NOTE — Discharge Summary (Signed)
[name], Jillian NO.:  [id number]   MEDICAL RECORD NO.:  [id number]          PATIENT TYPE:  INP   LOCATION:  5123                         FACILITY:  [facility]   PHYSICIAN:  [name], M.D.DATE OF BIRTH:  19-May-1924   DATE OF ADMISSION:  07/11/2008  DATE OF DISCHARGE:  07/20/2008                               DISCHARGE SUMMARY   DISCHARGE DIAGNOSES:  1. Acute pancreatitis - drug-induced, lisinopril discontinued.  2. Fluid overload - likely secondary to increased IV fluids hydration      with #1.  Last 2-D echocardiogram February 2009 with ejection      fraction of 55-65%.  Follow up with Dr. Lucianne Clay outpatient for      recheck 2-D echocardiogram.  3. Hypokalemia - potassium was replaced.  4. Hypoglycemia - resolved, workup negative, follow up with Dr. Lucianne Clay      outpatient.  5. Hypertension.  6. History of cerebrovascular accident.  7. History of macular degeneration.  8. History of hyperlipidemia.   PROCEDURES AND STUDIES:  1. Abdominal ultrasound - negative abdominal ultrasound.  2. Chest x-ray - no acute cardiopulmonary abnormalities.  3. Random cortisol level 28.1.  4. TSH 2.857.   BRIEF HISTORY:  The patient is an 77 year old white female with the  above-listed medical problems who presented with complaints of abdominal  pain.  She admitted to nausea with decreased appetite but denied any  vomiting.  She denied cough, shortness of breath, chest pain.  She  denied any prior history of gallstones or gallbladder surgery.  She also  denied alcohol use.  She was seen in the ER and a lipase level was done  which was elevated at 1948.  She was admitted for further evaluation and  management.   Please see the full admission history and physical dictated on July 11, 2008 by Dr. [name] for the details of the admission physical exam as  well as the laboratory data.   HOSPITAL COURSE:  1. Acute pancreatitis - as discussed above.  Upon admission she  was      kept n.p.o., hydrated with IV fluids and her pain managed with IV      analgesics.  As noted above, the patient denied alcohol abuse and      workup for the etiology included an abdominal ultrasound which      revealed no gallstones as above.  She also had triglyceride level      done which was 35.  It was noted that she had been on lisinopril      and one of its adverse side effects is pancreatitis (the patient      also reported she had been on a sulfa antibiotic and she unsure      of the name, and this had been discontinued prior to her admission.      With the above interventions, she gradually improved.  Her      abdominal pain decreased and her lipase levels also gradually      improved.  She was subsequently started on a clear liquid diet and  was gradually advanced to solids.  She is tolerating the solids at      this time.  She has not had any further abdominal pain.  Also no      nausea or vomiting.  She is hemodynamically stable.  Her last      lipase level was 146.  She is to follow up with her primary care      physician upon discharge.  2. Fluid overload - the patient was hydrated with IV fluids while in      the hospital and subsequently developed lower extremity edema as      well as some shortness of breath.  A chest x-ray was done which did      not show any pulmonary edema.  A B-natriuretic peptide was done      which was elevated at 575.  The patient was then diuresed with IV      Lasix and, with this, her shortness of breath resolved.  The      peripheral edema is improved.  Her B-natriuretic peptide prior to      discharge today is 229.  It was noted that her last 2-D      echocardiogram was done February 2009 and at that time her ejection      fraction was 55-65% and there was no mention of diastolic      dysfunction at the time.  The impression was that the fluid      overload was secondary to the increased IV fluids she received      secondary to  #1.  She is to follow up with her primary care      physician for a 2-D echocardiogram for reevaluation of her ejection      fraction as well as any findings consistent with diastolic      dysfunction.  Her creatinine went from 1.03 to 1.32 with diuresis      and so her Lasix dose has been changed to 20 mg every other day      upon discharge.  3. Hypokalemia - her potassium was replaced while in the hospital.  4. Hypertension - she was maintained on her metoprolol during her      hospital stay.  As noted above the ACE inhibitor/lisinopril was      discontinued.  Her blood pressures have been controlled on the      current metoprolol and she is to follow up with her primary care      physician for further monitoring.  She might need another      medication added for optimal blood pressure control outpatient      since the lisinopril has been discontinued as already mentioned.  5. Hypoglycemia - workup included cortisol TSH which were within      normal limits as above, LFTs were also done and were within normal      limits.  She was placed on D5 and subsequently p.o. intake      improved.  Her blood glucose normalized.  She was not on any oral      hypoglycemic agents.  6. Her other chronic medical problems remained stable during this      hospital stay and she was maintained on her outpatient medications      except as indicated above.   DISCHARGE MEDICATIONS:  1. Lasix 20 mg 1 p.o. every other day.  2. KCl 10 mEq 1 p.o. every other day.  3. Patient instructed to  discontinue lisinopril.  4. The patient to continue Zocor 40 mg daily.  5. Metoprolol 25 mg b.i.d.  6. Aggrenox 25 mg b.i.d.  7. Prilosec 40 mg p.o. daily.  8. Vitamin D daily.  9. Calcium 1 p.o. b.i.d..   FOLLOWUP CARE:  Dr. Lucianne Clay as scheduled on March 16.   DISCHARGE CONDITION:  Improved/Stable.      [name], M.D.  Electronically Signed     [name], M.D.  Electronically Signed   ACV/MEDQ   D:  07/20/2008  T:  07/20/2008  Job:  [id number]   cc:   [name], M.D.

## 2010-09-27 NOTE — Procedures (Signed)
EEG ID NUMBER:  [id number]   REFERRING PHYSICIAN:  [facility].   CLINICAL HISTORY:  An 77 year old patient is being evaluated for  progressive confusion and weakness over 3 weeks and the patient had a  unresponsive episode.   This is a portable EEG performed.  The patient described as  unresponsive.   MEDICATIONS LISTED:  NovoLog, Synthroid, Protonix, Lasix, Rocephin, and  Zofran.   No clear background awake rhythm is noted.  Background consists of 6-7  Hz theta, which is admixed with intermittent 3-4 Hz rhythmic delta  slowing.  No paroxysmal epileptiform activity, spikes, or sharp waves  are noted.  Length of the recording is 24.3 minutes.  Technical  component is average.  EKG tracing reveals regular sinus rhythm.  Hyperventilation and photic stimulation were not performed.   IMPRESSION:  This EEG is abnormal due to presence of moderate  bihemispheric dysfunction, which is a nonspecific finding seen in a  variety of toxic metabolic infectious and hypoxic etiologies.  No  definite epileptiform features were noted.           ______________________________  Sunny Schlein. Pearlean Brownie, MD     [id number]  D:  11/13/2008 18:20:28  T:  11/14/2008 04:03:09  Job #:  [id number]   cc:   [facility]

## 2010-09-27 NOTE — H&P (Signed)
NAMEHAROLD, MATTES                  ACCOUNT NO.:  [id number]   MEDICAL RECORD NO.:  [id number]          PATIENT TYPE:  INP   LOCATION:  1222                         FACILITY:  [facility]   PHYSICIAN:  [name], M.D.DATE OF BIRTH:  10-12-1924   DATE OF ADMISSION:  11/12/2008  DATE OF DISCHARGE:                              HISTORY & PHYSICAL   PRIMARY CARE PHYSICIAN:  [name], M.D.   CHIEF COMPLAINT:  Progressive weakness and confusion times 3 weeks,  epistaxis  times 2 days.   HISTORY OF PRESENT ILLNESS:  [name] is an 77 year old white female  with past medical history of CVA who presented to [facility] emergency  department with epistaxis times 2 days per emergency department  physician.  While in the ED, the patient experienced a period of  unresponsiveness when being helped to bedside commode.  Reported heart  rate during this event was 42 with unknown blood pressure.  Episode  lasted approximately several seconds and resolved without any  intervention.  Upon discussion with daughters, they report progressive  weakness and confusion times 3 weeks requiring the patient to use a  walker as well as requiring maximum assistance for ADLs.  Prior to this  three week period patient was independent of all ADLs and ambulatory  without any assistive devices.  The patient was seen by primary care  physician approximately 3 weeks ago with onset of these symptoms at  which time it was discovered that the patient was not taking her  Synthroid as prescribed by Dr. [name].  At the same visit the patient was  prescribed Cipro for suspected urinary tract infection.  Difficulty  obtaining information from the patient secondary to mild confusion as  well as lethargy.  However, majority of the HPI was obtained from the  patient's daughters at bedside.   PAST MEDICAL HISTORY:  1. History of CVA.  2. Hypertension.  3. Macular degeneration.  4. Hard of hearing.  5. Pancreatitis, ACE  inhibitor induced, hospitalized in February of      2010.  6. Hyperlipidemia.  7. Hypothyroidism.   MEDICATIONS:  1. Synthroid 75 mcg p.o. daily.  2. Metoprolol 25 mg p.o. daily.  3. Simvastatin 40 mg p.o. daily.  4. Omeprazole 20 mg p.o. b.i.d.  5. Aggrenox 25-200 p.o. b.i.d.  6. Robaxin 500 mg p.o. t.i.d.  7. Glucosamine 1 tablet p.o. daily.  8. Folic acid, unknown dose.  9. Cipro prescribed on June 15 for a total of 5 days' treatment for      UTI per primary care physician.   ALLERGIES:  No known drug allergies.   FAMILY HISTORY:  Reviewed and noncontributory to admit.   SOCIAL HISTORY:  Patient is married.  She lives with her husband.  She  has three daughters who offer very good support.  No history of tobacco  or EtOH abuse.  The patient was independent of daily living until  approximately 3 weeks prior to this admission.   REVIEW OF SYSTEMS:  GENERAL:  Positive for weakness and fatigue.  Negative for fever.  EYES:  Patient with nonacute vision loss to right  eye secondary to macular degeneration.  ENT:  Negative sore throat.  Positive epistaxis.  NECK:  Negative stiffness or swelling.  CARDIOVASCULAR:  Negative chest pain or shortness of breath.  Negative  palpitations.  RESPIRATORY:  Negative cough or shortness of breath.  GI:  Positive suprapubic pain.  Negative nausea, vomiting, diarrhea, melena  or hematochezia.  GU:  Positive dysuria, negative hematuria.  SKIN:  Negative rash or suspicious lesions.  NEUROLOGIC:  Negative focal  weakness or facial droop.  Negative headache, numbness or tingling.  PSYCHOLOGICALLY:  The patient with depression and anxiety per report of  her daughters.   PHYSICAL EXAM:  Blood pressure 136/70, heart rate 40, respirations 16,  temperature 97.2, O2 saturation 98% on room air.  IN GENERAL:  This is a quite weak appearing, pale white female in no  acute distress.  HEAD:  Normocephalic, atraumatic.  Pupils.  Extraocular movements are   intact without any scleral icterus right-sided.  Pupils equal, round,  reactive to light.  EARS/NOSE/THROAT:  The patient with packing to left naris with bleeding  controlled.  No oropharyngeal lesions.  NECK:  Supple without any thyromegaly or adenopathy.  CHEST:  With symmetrical movement, nontender to palpation.  CARDIOVASCULAR:  S1, S2 with decreased rate, regular rhythm, 1-2+  pitting edema bilateral lower extremities distal to ankles.  RESPIRATORY:  The patient with bibasilar crackles.  No increased work of  breathing.  GI:  Abdomen is soft, nondistended, diffusely tender without any rebound  or guarding.  Bowel sounds are positive.  No appreciated masses or  hepatosplenomegaly.  MUSCULOSKELETAL:  The patient without any joint deformity or swelling.  NEUROLOGICALLY:  The patient with generalized weakness, however no focal  motor or sensory deficits on exam.  PSYCHOLOGICALLY:  The patient is alert, oriented x2.  She is not  oriented to month.  She has quite a flat affect which may be secondary  to profound weakness.   PERTINENT LABORATORIES AND X-RAYS:  CBC shows pancytopenia with white  cell count of 3.6, platelet count of 103, hemoglobin of 9.6, hematocrit  28.6.  CMET significant for mild azotemia with BUN of 44, blood glucose  of 165.  AST slightly elevated at 53, otherwise liver function tests  within normal limits.  INR 1.0.  Urinalysis reveals small leukocytes  with 7-10 WBCs and few epithelial's.  CT of the head without any  evidence of acute infarct, old small right lacunar infarct is stable.  EKG reveals sinus bradycardia at 52 beats per minute without any  evidence of ischemic changes from February 2010.   IMPRESSION AND PLAN:  1. Progressive weakness, confusion.  Unclear etiology.  Question      metabolic versus neurologic versus cardiac.  We will admit the      patient to step-down unit for close monitoring.  Despite nontoxic-      appearing we will check blood  cultures x2 to rule out sepsis, cycle      cardiac enzymes, check MRI of the brain to rule out cerebrovascular      accident.  Check two-dimensional echocardiogram to evaluate left      ventricular ejection fraction.  Check a TSH.  Check chest x-ray.  2. Bradycardia with questionable episode of unresponsiveness in      emergency room.  Question relation to problem #1 versus over      treatment with beta blocker versus cardiac versus metabolic      etiology.  Again we will cycle cardiac enzymes.  check TSH and hold      beta blocker therapy indefinitely.  3. Pancytopenia.  This finding is new since March 2010 per chart      review.  Question reactive secondary to illness versus drug effect      versus malignancy with questionable mitral valve stenosis.  We will      stop Aggrenox and statin medication.  Monitor CBC q.a.m.  Consider      Hematology Oncology consult for possible bone marrow biopsy if no      improvement in pancytopenia.  As far as STAT laboratory work we      will check peripheral smear, B12 and folate upon admission.  4. Epistaxis.  Likely related to thrombocytopenia.  Nasal packing      placed per emergency room physician can be removed in 2 days.      Bleeding is controlled at this time.  Again, hold Aggrenox.  5. Urinary tract infection.  We will start empiric Rocephin.  Send      urine for culture and sensitivity.  6. Hyperglycemia.  Random without any history of type 2 diabetes.  We      will check A1c.  Sliding scale insulin sensitive scale during      inpatient hospitalization.  7. Azotemia, mild.  Unclear etiology.  We will check Hemoccult stools,      monitor Ins and Outs, check urine osmolality, urine sodium.  Please      note the patient's creatinine is at baseline upon admission.  8. Abnormal liver function studies.  The patient was mildly elevated      AST, otherwise normal liver function tests.  We will recheck in      a.m.  If remains elevated, consider  further workup with ultrasound      versus abdominal CT.  9. History of hypertension.  Blood pressure stable on admission.  We      will hold beta blocker secondary problem #2 and monitor blood      pressure for need for further intervention.  10.Hyperlipidemia.  Hold that medication secondary to #3.  11.Hypothyroidism.  Please see problem #1.  Continue current Synthroid      dose.  Check TSH, free T4 and T3.  12.Macular degeneration.  We will continue home medications.  13.Prophylaxis.  Sequential compression devices and proton pump      inhibitor.  Please note the patient is a full code per discussion      with her daughters.      Cordelia Pen, NP      [name], M.D.  Electronically Signed    LE/MEDQ  D:  11/12/2008  T:  11/12/2008  Job:  [id number]   cc:   [name], M.D.  Fax: [phone]

## 2010-09-27 NOTE — H&P (Signed)
[name], [name] NO.:  [id number]   MEDICAL RECORD NO.:  [id number]          PATIENT TYPE:  INP   LOCATION:  6532                         FACILITY:  [facility]   PHYSICIAN:  Donalynn Furlong, MD      DATE OF BIRTH:  [birth year]   DATE OF ADMISSION:  07/11/2008  DATE OF DISCHARGE:                              HISTORY & PHYSICAL   PRIMARY CARE PHYSICIAN:  [name], M.D. with [facility].   CHIEF COMPLAINT:  Abdominal pain.   HISTORY OF PRESENT ILLNESS:  [name] is an 77 year old Caucasian  female who lives with her husband. She presented to [facility] ER  tonight with ongoing epigastric abdominal pain, burning type in nature  for around 3 days associated with decreased appetite and some nausea.  She did not have any vomiting, fever.  She denied any cough, shortness  of breath, chest pain or other chest complaint at this time.  She denied  any history of gallstone or gallbladder surgery in the past.  She denied  any history of acute pancreatitis in the past.  She denied any history  of alcohol use recently.  Patient had elevated lipase in the ER and she  was diagnosed with acute pancreatitis and admitted for further workup.  Otherwise review of system was negative.   PAST MEDICAL HISTORY:  1. Hypertension.  2. History of CVA.  3. The patient is hard of hearing.  4. Poor vision with macular degeneration.   ALLERGIES:  None.   HOME MEDICATIONS:  1. Zocor 40 mg daily.  2. Metoprolol 25 mg daily.  3. Lisinopril 2.5 mg daily.  4. Aggrenox 25 mg b.i.d.  5. Prilosec 40 mg daily.  6. Vitamin D.  7. Eye drops.   SOCIAL HISTORY:  The patient lives with her husband, has three children,  all daughters, one daughter with colon disease, one daughter has a  breast lump, one daughter with neck problems.   FAMILY HISTORY:  Mother died of MI.  Father died at age of 64 with heart  disease.  The patient has two brothers, one died with MI and one is  doing  well.   REVIEW OF SYSTEMS:  Positive as per HPI, otherwise, negative review of  systems done on 14 systems.   PHYSICAL EXAMINATION:  VITAL SIGNS:  Blood pressure 125/52, heart rate  58, respirations 16, temperature unable to obtain, not recorded in the  chart, oxygen saturation 100% on room air.  GENERAL:  Exam alert, oriented x3 laying in bed without any acute  distress.  CARDIOVASCULAR:  S1, S2 regular, no murmur or gallop.  LUNGS:  Clear to auscultation bilaterally.  No wheezing, rhonchi or  crackles.  ABDOMEN:  Mild tenderness in epigastric region.  No organomegaly.  No  distention.  EXTREMITIES:  No clubbing, cyanosis or edema.  Pulses palpable in all  four extremities.  HEENT:  Head normocephalic nontraumatic.  Eyes:  Pupils react to light  and accommodation.  Extraocular muscles intact.  Oral cavity moist.  No  thrush noted.  NECK:  No thyromegaly or  JVD.  SKIN:  No rash or bruise.   LABORATORY DATA:  Shows a chemistry panel with a potassium 5.2, chloride  113, BUN 75, creatinine 1.3 and troponin two sets negative.  Ultrasound  shows negative abdominal ultrasound.   ASSESSMENT AND PLAN:  1. New onset acute uncomplicated pancreatitis.  Most likely due to      gall stone versus hypertriglyceridemia versus medication side      effect.  2. Hyperkalemia  3. History of hypertension.  4. History of CVA.  5. History of poor vision due to macular degeneration.  6. The patient is hard of hearing.   PLAN:  Will admit the patient on telemetry bed under [facility] red team with  a diagnosis of acute pancreatitis.  The patient is full code.  The  patient will be n.p.o. except water, ice chips and medications.  Will  check vitals and input output per protocol.  We will check CBC, CMP,  amylase, lipase in the morning.  Will start IV morphine 2 mg q.2 h  p.r.n. for pain.  Will start p.o. Tylenol 100 mg q.6 h p.r.n. for fever.  Will start IV Zofran for 4 mg q.6 h p.r.n. for nausea.  Will  start IV  Phenergan 25 mg q.6 h for nausea.  Will start IV normal saline at 75 mL  per hour for hydration.  We will continue metoprolol 25 mg b.i.d.,  Aggrenox 25 mg b.i.d. and Prilosec at home doses and hold Zocor and  other medication.  Further plan according to the workup pending.      Donalynn Furlong, MD  Electronically Signed     TVP/MEDQ  D:  07/12/2008  T:  07/12/2008  Job:  [id number]   cc:   [name], M.D.  Corinna L. Lendell Caprice, MD

## 2010-09-27 NOTE — H&P (Signed)
NAME:  [name], [name] NO.:  [id number]   MEDICAL RECORD NO.:  [id number]          PATIENT TYPE:  INP   LOCATION:  1831                         FACILITY:  [facility]   PHYSICIAN:  Marlan Palau, M.D.  DATE OF BIRTH:  24-Sep-1924   DATE OF ADMISSION:  07/02/2007  DATE OF DISCHARGE:                              HISTORY & PHYSICAL   HISTORY OF PRESENT ILLNESS:  [name] is an 77 year old right-handed  white female born 16-Feb-1925 with a history of hypertension and  cerebrovascular disease.  This patient sustained a stroke affecting the  left side of the body with numbness of the left arms and right face 6  years ago.  The patient has been on Aggrenox and doing fairly well.  Today around 1:30 p.m., the patient noted sudden onset of increased  numbness in the left arm.  This was unassociated with weakness, gait  disturbance, speech problems, or change in vision.  The patient is  essentially blind in the right eye with no light perception, has minimal  vision in the left eye due to macular degeneration.  The patient was  brought to the Emergency Room.  Code stroke was called.  Neurology has  seen the patient for further evaluation of possible stroke.  CT scan of  the brain shows no acute changes.   PAST MEDICAL HISTORY:  1. New onset left arm numbness, onset around 1:30 p.m. today.  2. History of stroke affecting sensation of right face, left arm.  3. Hypertension.  4. 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  5. D&C in the past.   CURRENT MEDICATIONS:  1. The patient is on Aggrenox 1 twice daily.  2. Carvedilol 6.25 mg b.i.d.  3. Simvastatin 40 mg daily.  4. The patient was just taken off of Toprol within the last week and      has felt bad since that time.   ALLERGIES:  THE PATIENT HAS NO KNOWN ALLERGIES.   SOCIAL HISTORY:  Does not smoke or drink.  The patient is married, lives  in the [place] area.  Has 3 children, all  daughters, 1  daughter with colon disease, 1 daughter has been worked up for breast  lump, 1 daughter has neck problems.  Again, the patient herself is  retired.   FAMILY MEDICAL HISTORY:  The mother died with MI.  Father died at age 33  of heart disease.  The patient has 2 brothers, 1 died with an MI and 1  is alive and well.   REVIEW OF SYSTEMS:  Notable for no recent fevers, chills.  The patient  has had no recent change in vision, although the vision is very poor.  Denies neck pain. Has had some chronic shortness of breath, some chest  pains today.  Does note some dizziness today.  Denies any abdominal  discomfort, nausea, vomiting.  No loss of conscious or blackout  episodes.   PHYSICAL EXAMINATION:  VITALS:  Blood pressure is 191/75, heart rate 77,  respiratory rate 16, temperature afebrile.  GENERAL: The patient  is a fairly well-developed, elderly, white female  who is alert and cooperative at the time of examination.  HEENT EXAMINATION:  Head is atraumatic.  EYES: Pupils are 3 to 4 mm on the left and reactive.  Disk is flat, not  visualized on the right.  Extraocular movements are full.  NECK:  Supple.  No carotid bruits noted.  RESPIRATORY EXAMINATION:  Clear.  CARDIOVASCULAR EXAMINATION:  Reveals regular rhythm.  No obvious murmurs  or rubs noted.  ABDOMEN:  Reveals positive bowel sounds.  No organomegaly or tenderness  noted.  EXTREMITIES:  Without significant edema.  NEUROLOGIC EXAMINATION:  Cranial nerves as above.  Facial symmetry is  present.  Patient has good sensation of the face on the left side,  decreased on the right.  Visual impairment is significant with no light  perception on the right eye.  The patient does have full visual fields  on left, but has difficulty reading.  The patient has normal speech  pattern.  No dysarthria or aphasia.  Motor testing shows 5/5 strength in  all 4.  Good symmetric motor and tone noted throughout.  Sensory testing  is  notable for some hypesthesia on the left arm as compared to the  right.  Decreased sensation pinprick on the left leg.  The right has  fairly symmetric vibratory sensation on the lower extremities.  Decreased on the left compared to the right arm.  The patient has fair  finger-nose-finger and heel-to-shin bilaterally.  Gait was not tested.  Deep tendon reflexes are fairly symmetric.  Toes neutral bilaterally.  No drift is seen in the upper extremities.  No evidence of extinction is  noted.   NIH stroke scale score is 1.   LABORATORY VALUES:  Notable for white count of 4.6, hemoglobin of 11.7,  hematocrit 34.3, platelets of 207, MCV of 93.9.  The rest of the blood  work is pending.  CT of the head is as above.   IMPRESSION:  1. Probable subcortical or brainstem small-vessel infarct.  2. History of hypertension.   This patient has not been feeling well since being switched off of  Toprol.  The patient comes today with sudden onset of left arm  numbness.  Patient has had preexisting stroke event affecting the  sensation of the left side.  The patient clearly believes this is very  different and is persistent.  The patient may have had small extension  of prior stroke event.  We will admit the patient for further workup.  The patient's NIH stroke scale score is 1.  The patient is not a TPA  candidate due to minimal deficit.   PLAN:  1. Admission to [facility]  2. MRI of the brain.  3. MRI angiogram of the intracranial and extracranial vessels.  4. A 2-D echocardiogram.  5. Continue Aggrenox.  6. We will follow clinical course while in-house.      Marlan Palau, M.D.  Electronically Signed     CKW/MEDQ  D:  07/02/2007  T:  07/03/2007  Job:  [id number]   cc:   [name], M.D.  [facility]

## 2010-09-27 NOTE — Discharge Summary (Signed)
NAME:  [name], [name] NO.:  [id number]   MEDICAL RECORD NO.:  [id number]          PATIENT TYPE:  INP   LOCATION:  3030                         FACILITY:  [facility]   PHYSICIAN:  Marlan Palau, M.D.  DATE OF BIRTH:  05-02-1925   DATE OF ADMISSION:  07/02/2007  DATE OF DISCHARGE:  07/03/2007                               DISCHARGE SUMMARY   ADMISSION DIAGNOSES:  1. New onset of left arm numbness, rule out stroke.  2. Hypertension.   DISCHARGE DIAGNOSES:  1. Transient left arm numbness, possible transient ischemic attack.  2. Hypertension.  3. Macular degeneration with a significant visual field impairment.  4. Cerebrovascular disease with a prior left arm deficit.   PROCEDURES THIS ADMISSION:  1. CT of the head.  2. MRI of the brain.  3. MRI angiogram of the intracranial and extracranial vessels.  4. A 2-D echocardiogram.   COMPLICATION OF ABOVE PROCEDURES:  None.   HISTORY OF PRESENT ILLNESS:  [name] is an 77 year old right-handed  white female born 1924/05/24, with a history of cerebrovascular  disease with prior event 6 years ago that left her with some left sided  numbness.  The patient claims that she was initially placed on Coumadin  for unknown reasons and remained on Coumadin until about a year ago when  she suffered a hemorrhage in the right eye causing complete blindness in  this eye.  The patient was switched to Aggrenox and has done well since  that time.  The patient comes in with onset of some left arm numbness  that occurred on the day of admission.  The patient had a unremarkable  CT of the head, was set up for an MRI of the brain and MRI angiogram  that was done.  The patient's MRI of the brain shows no acute changes  seen.  There is very minimal small vessel changes on the MRI as well.  The MRI angiogram, by my reading, shows some atherosclerotic changes at  the carotid siphons on both sides, otherwise relatively unremarkable.  The patient has unremarkable MRI angiogram of the intracranial vessels  on the right and left.  The patient has a dominant left vertebral  artery.  A 2-D echocardiogram has been ordered, but is pending.   PAST MEDICAL HISTORY:  1. New onset of left arm numbness that is transient.  2. History of cerebrovascular disease with left-sided numbness as      above.  3. Hypertension.  4. Macular degeneration.  5. Hemorrhage in the right eye with blindness.  6. History of D&C in the past.   MEDICATIONS ON ADMISSION:  1. Aggrenox twice daily.  2. Carvedilol 6.25 mg twice daily.  3. Simvastatin 40 mg daily.  4. The patient is on Pred Forte ophthalmic solution 1% one drop twice      daily in the right eye.   The patient does not smoke or drink.   ALLERGIES:  NO KNOWN ALLERGIES.   Please refer to history and physical dictation summary for social  history, family history, review of systems, physical  examination.   LABORATORY VALUES:  Notable for a white count of 4.9, hemoglobin 11.6,  hematocrit 35, MCV of 94.9, platelets of 221.  INR of 0.9.  Sodium 135,  potassium 4.1, chloride 100, CO2 26, glucose of 96, BUN of 21,  creatinine of 0.99. Alkaline phosphatase 75, SGOT 33, SGPT of 22, total  protein 6.7, albumin 3.3, calcium 9.2.  Hemoglobin A1c of 5.8.  CK level  of 32, MB fraction 2.2, troponin-I less than 0.01.  Urinalysis reveals  specific gravity of 1.012, pH 6.5, 3-6 white cells, rare bacteria.   HOSPITAL COURSE:  The patient has done fairly well during the course of  hospitalization.  EKG reveals normal sinus rhythm, normal EKG, heart  rate 70.  The patient was set up for the MRI which did not show evidence  of an acute stroke as above.  The patient will be set up for a 2-D  echocardiogram, is essentially back to baseline.  NIH stroke scale score  on admission was 1.  Patient is not felt to be a TPA candidate to  minimal deficit.  The patient will be set up for discharge following a  2-  D echocardiogram.   DISCHARGE MEDICATIONS:  1. Aggrenox 1 tablet twice daily.  2. Carvedilol 6.25 mg one twice daily.  3. Pred Forte ophthalmic solution 1% one drop right eye twice daily.  4. Simvastatin 40 mg day.   FOLLOW UP:  The patient will follow up with Dr. Anne Hahn in 2 months  following discharge.   At the time of discharge the patient is bright, alert, cooperative,  moves all fours, normal strength, no drift, normal speech.      Marlan Palau, M.D.  Electronically Signed     CKW/MEDQ  D:  07/03/2007  T:  07/03/2007  Job:  [id number]   cc:   [name], M.D.  [facility]

## 2010-10-04 ENCOUNTER — Other Ambulatory Visit: Payer: Self-pay | Admitting: Endocrinology

## 2010-10-04 DIAGNOSIS — Z1231 Encounter for screening mammogram for malignant neoplasm of breast: Secondary | ICD-10-CM

## 2010-11-21 ENCOUNTER — Ambulatory Visit
Admission: RE | Admit: 2010-11-21 | Discharge: 2010-11-21 | Disposition: A | Discharge status: Home / Self Care | Payer: Medicare Other | Source: Ambulatory Visit | Attending: Endocrinology | Admitting: Endocrinology

## 2010-11-21 DIAGNOSIS — Z1231 Encounter for screening mammogram for malignant neoplasm of breast: Secondary | ICD-10-CM

## 2011-02-03 LAB — CBC
HCT: 34.3 — ABNORMAL LOW
Hemoglobin: 11.6 — ABNORMAL LOW
MCHC: 33.1
MCHC: 34.1
MCV: 93.9
Platelets: 207
Platelets: 221
RBC: 3.65 — ABNORMAL LOW
RDW: 13.2

## 2011-02-03 LAB — COMPREHENSIVE METABOLIC PANEL
ALT: 24
AST: 33
Alkaline Phosphatase: 75
BUN: 21
CO2: 29
Calcium: 9.8
Chloride: 100
Chloride: 105
Creatinine, Ser: 0.99
GFR calc non Af Amer: 52 — ABNORMAL LOW
Glucose, Bld: 115 — ABNORMAL HIGH
Glucose, Bld: 96
Potassium: 4.1
Sodium: 143
Total Bilirubin: 0.5
Total Bilirubin: 0.6
Total Protein: 6.7

## 2011-02-03 LAB — PROTIME-INR
INR: 0.9
INR: 0.9
Prothrombin Time: 12.2
Prothrombin Time: 12.6

## 2011-02-03 LAB — URINALYSIS, ROUTINE W REFLEX MICROSCOPIC
Nitrite: NEGATIVE
Protein, ur: NEGATIVE
Urobilinogen, UA: 0.2

## 2011-02-03 LAB — POCT I-STAT CREATININE
Creatinine, Ser: 1.2
Operator id: 146091

## 2011-02-03 LAB — HEMOGLOBIN A1C: Mean Plasma Glucose: 129

## 2011-02-03 LAB — CK TOTAL AND CKMB (NOT AT ARMC)
CK, MB: 2.2
Relative Index: INVALID
Total CK: 37

## 2011-02-03 LAB — HOMOCYSTEINE: Homocysteine: 16.8 — ABNORMAL HIGH

## 2011-02-03 LAB — POCT CARDIAC MARKERS
Operator id: 146091
Troponin i, poc: <0.05

## 2011-02-03 LAB — DIFFERENTIAL
Basophils Absolute: 0
Basophils Absolute: 0
Basophils Relative: 0
Basophils Relative: 1
Eosinophils Absolute: 0.2
Eosinophils Relative: 3
Lymphocytes Relative: 21
Lymphs Abs: 0.9
Neutro Abs: 3.1
Neutrophils Relative %: 63
Neutrophils Relative %: 64

## 2011-02-03 LAB — APTT: aPTT: 32

## 2011-02-03 LAB — LIPID PANEL
HDL: 89
Total CHOL/HDL Ratio: 1.9
Triglycerides: 69
VLDL: 14

## 2011-02-03 LAB — I-STAT 8, (EC8 V) (CONVERTED LAB)
Acid-Base Excess: 6 — ABNORMAL HIGH
BUN: 24 — ABNORMAL HIGH
Chloride: 107
HCT: 37
Hemoglobin: 12.6
Operator id: 146091
Potassium: 4.2
Sodium: 140
pCO2, Ven: 38.9 — ABNORMAL LOW

## 2011-02-03 LAB — URINE MICROSCOPIC-ADD ON

## 2011-02-09 LAB — CBC
MCHC: 33.3
RBC: 3.76 — ABNORMAL LOW

## 2011-02-09 LAB — URINALYSIS, ROUTINE W REFLEX MICROSCOPIC
Bilirubin Urine: NEGATIVE
Ketones, ur: NEGATIVE
Nitrite: NEGATIVE
Urobilinogen, UA: 0.2
pH: 6

## 2011-02-09 LAB — URINE MICROSCOPIC-ADD ON

## 2011-02-09 LAB — COMPREHENSIVE METABOLIC PANEL
ALT: 17
AST: 23
Albumin: 3.5
CO2: 28
Calcium: 9.7
GFR calc Af Amer: >60
GFR calc non Af Amer: 54 — ABNORMAL LOW
Sodium: 140
Total Protein: 6.8

## 2011-02-09 LAB — DIFFERENTIAL
Eosinophils Absolute: 0.1
Eosinophils Relative: 2
Lymphs Abs: 1.3
Monocytes Absolute: 0.8
Monocytes Relative: 13 — ABNORMAL HIGH

## 2011-02-09 LAB — URINE CULTURE: Colony Count: 60000

## 2011-02-09 LAB — POCT CARDIAC MARKERS
CKMB, poc: 2
Myoglobin, poc: 105
Troponin i, poc: <0.05

## 2011-08-04 ENCOUNTER — Emergency Department (HOSPITAL_COMMUNITY)
Admission: EM | Admit: 2011-08-04 | Discharge: 2011-08-04 | Disposition: A | Discharge status: Home / Self Care | Payer: Medicare Other | Attending: Emergency Medicine | Admitting: Emergency Medicine

## 2011-08-04 ENCOUNTER — Other Ambulatory Visit: Payer: Self-pay

## 2011-08-04 ENCOUNTER — Emergency Department (HOSPITAL_COMMUNITY): Payer: Medicare Other

## 2011-08-04 ENCOUNTER — Encounter (HOSPITAL_COMMUNITY): Payer: Self-pay | Admitting: Emergency Medicine

## 2011-08-04 DIAGNOSIS — S0003XA Contusion of scalp, initial encounter: Secondary | ICD-10-CM | POA: Insufficient documentation

## 2011-08-04 DIAGNOSIS — Z8679 Personal history of other diseases of the circulatory system: Secondary | ICD-10-CM | POA: Insufficient documentation

## 2011-08-04 DIAGNOSIS — S1093XA Contusion of unspecified part of neck, initial encounter: Secondary | ICD-10-CM | POA: Insufficient documentation

## 2011-08-04 DIAGNOSIS — W19XXXA Unspecified fall, initial encounter: Secondary | ICD-10-CM

## 2011-08-04 DIAGNOSIS — R51 Headache: Secondary | ICD-10-CM | POA: Insufficient documentation

## 2011-08-04 DIAGNOSIS — S0083XA Contusion of other part of head, initial encounter: Secondary | ICD-10-CM

## 2011-08-04 DIAGNOSIS — Y92009 Unspecified place in unspecified non-institutional (private) residence as the place of occurrence of the external cause: Secondary | ICD-10-CM | POA: Insufficient documentation

## 2011-08-04 DIAGNOSIS — I10 Essential (primary) hypertension: Secondary | ICD-10-CM | POA: Insufficient documentation

## 2011-08-04 DIAGNOSIS — S0181XA Laceration without foreign body of other part of head, initial encounter: Secondary | ICD-10-CM

## 2011-08-04 DIAGNOSIS — S0180XA Unspecified open wound of other part of head, initial encounter: Secondary | ICD-10-CM | POA: Insufficient documentation

## 2011-08-04 DIAGNOSIS — W1809XA Striking against other object with subsequent fall, initial encounter: Secondary | ICD-10-CM | POA: Insufficient documentation

## 2011-08-04 HISTORY — DX: Cerebral infarction, unspecified: I63.9

## 2011-08-04 MED ORDER — CEPHALEXIN 500 MG PO CAPS: 500.0000 mg | ORAL_CAPSULE | Freq: Three times a day (TID) | ORAL | Status: AC

## 2011-08-04 MED ORDER — CEPHALEXIN 500 MG PO CAPS
500.0000 mg | ORAL_CAPSULE | Freq: Once | ORAL | Status: AC
  Administered 2011-08-04: 500 mg via ORAL
  Filled 2011-08-04: qty 1

## 2011-08-04 MED ORDER — TETANUS-DIPHTH-ACELL PERTUSSIS 5-2.5-18.5 LF-MCG/0.5 IM SUSP
0.5000 mL | Freq: Once | INTRAMUSCULAR | Status: AC
  Administered 2011-08-04: 0.5 mL via INTRAMUSCULAR
  Filled 2011-08-04: qty 0.5

## 2011-08-04 MED ORDER — LIDOCAINE-EPINEPHRINE (PF) 1 %-1:200000 IJ SOLN
INTRAMUSCULAR | Status: AC
  Administered 2011-08-04: 30 mL
  Filled 2011-08-04: qty 10

## 2011-08-04 NOTE — ED Notes (Signed)
Pt able to ambulate down hallway with steady gait.

## 2011-08-04 NOTE — ED Provider Notes (Addendum)
History     CSN: [id number]  Arrival date & time 08/04/11  1211   First MD Initiated Contact with Patient 08/04/11 1231      Chief Complaint  Patient presents with  . Head Laceration    (Consider location/radiation/quality/duration/timing/severity/associated sxs/prior treatment) Patient is a 78 y.o. female presenting with scalp laceration. The history is provided by the patient.  Head Laceration Pertinent negatives include no chest pain, no abdominal pain, no headaches and no shortness of breath.  pt s/p fall at home. Got feet tangled when try to unwind dog, head hit edge of filling cabinet. No loc. Dazed. Large lac to forehead and scalp. Tetanus unknown. No neck pain. No back pain. States recent health at baseline. No faintness or dizziness. Since injury, dull headache, no severe pain. No vomiting. No numbness/weakness. Also pain localized to lac, constant, sharp.   Past Medical History  Diagnosis Date  . Hypertension   . Stroke     History reviewed. No pertinent past surgical history.  History reviewed. No pertinent family history.  History  Substance Use Topics  . Smoking status: Not on file  . Smokeless tobacco: Not on file  . Alcohol Use:     OB History    Grav Para Term Preterm Abortions TAB SAB Ect Mult Living                  Review of Systems  Constitutional: Negative for fever and chills.  HENT: Negative for neck pain.   Eyes: Negative for redness and visual disturbance.  Respiratory: Negative for shortness of breath.   Cardiovascular: Negative for chest pain.  Gastrointestinal: Negative for abdominal pain.  Genitourinary: Negative for flank pain.  Musculoskeletal: Negative for back pain.  Skin: Negative for rash.  Neurological: Negative for headaches.  Hematological: Does not bruise/bleed easily.  Psychiatric/Behavioral: Negative for confusion.    Allergies  Review of patient's allergies indicates no known allergies.  Home Medications    Current Outpatient Rx  Name Route Sig Dispense Refill  . OCUVITE PO TABS Oral Take 1 tablet by mouth daily.    Marland Kitchen CALCIUM CARBONATE 1250 MG PO TABS Oral Take 1 tablet by mouth daily.    . ASPIRIN-DIPYRIDAMOLE ER 25-200 MG PO CP12 Oral Take 1 capsule by mouth 2 (two) times daily.    Marland Kitchen FOLIC ACID 1 MG PO TABS Oral Take 1 mg by mouth daily.    Marland Kitchen GLUCOSAMINE-CHONDROITIN 500-400 MG PO TABS Oral Take 1 tablet by mouth 3 (three) times daily.    Marland Kitchen LEVOTHYROXINE SODIUM 100 MCG PO TABS Oral Take 100 mcg by mouth daily.    Marland Kitchen METOPROLOL TARTRATE 25 MG PO TABS Oral Take 25 mg by mouth 2 (two) times daily.    . ADULT MULTIVITAMIN W/MINERALS CH Oral Take 1 tablet by mouth daily.    Marland Kitchen SIMVASTATIN 40 MG PO TABS Oral Take 40 mg by mouth every evening.    Marland Kitchen TRAMADOL HCL 50 MG PO TABS Oral Take 50 mg by mouth every 6 (six) hours as needed. For pain relief      BP 190/74  Resp 18  Ht 5\' 4"  (1.626 m)  Wt 168 lb (76.204 kg)  BMI 28.84 kg/m2  SpO2 96%  Physical Exam  Nursing note and vitals reviewed. Constitutional: She is oriented to person, place, and time. She appears well-developed and well-nourished. No distress.  HENT:       Deep laceration to forehead, scalp. Contused tissue, flap. Lac extends to bone. No  step off noted.   Eyes: Conjunctivae are normal. Pupils are equal, round, and reactive to light. No scleral icterus.  Neck: Normal range of motion. Neck supple. No tracheal deviation present.  Cardiovascular: Normal rate, regular rhythm, normal heart sounds and intact distal pulses.   Pulmonary/Chest: Effort normal and breath sounds normal. No respiratory distress.  Abdominal: Soft. Normal appearance and bowel sounds are normal. She exhibits no distension. There is no tenderness.  Musculoskeletal: She exhibits no edema.       ctls spine non tender, aligned, no step off.   Neurological: She is alert and oriented to person, place, and time.       Motor intact bil.   Skin: Skin is warm and dry. No  rash noted.  Psychiatric: She has a normal mood and affect.    ED Course  Procedures (including critical care time)   Ct head: no acute process per radiologist.      MDM  Wound sutured to close wound/stop bleeding.   LACERATION REPAIR Performed by: Suzi Roots Authorized by: Suzi Roots Consent: Verbal consent obtained. Risks and benefits: risks, benefits and alternatives were discussed Consent given by: patient Patient identity confirmed: provided demographic data Prepped and Draped in normal sterile fashion Wound explored  Laceration Location: forehead and scalp  Laceration Length: 7 cm  No Foreign Bodies seen or palpated  Anesthesia: local infiltration  Local anesthetic: lidocaine 2% w epinephrine  Anesthetic total: 8 ml  Irrigation method: syringe Amount of cleaning: standard  Skin closure: layered closure. Subcut/st 4-0 vicryl, skin 5-0 nylon  Number of sutures: 4 subcut/14 skin  Technique: interrupted  Patient tolerance: Patient tolerated the procedure well with no immediate complications.        Date: 08/04/2011  Rate: 61  Rhythm: normal sinus rhythm  QRS Axis: normal  Intervals: normal  ST/T Wave abnormalities: nonspecific T wave changes  Conduction Disutrbances:incompelte rbbb  Narrative Interpretation:   Old EKG Reviewed: changes noted   Recheck pt denies any new c/o. No pain. No numbness/weakness. Will ambulate w assist. Tetanus im.  Spine nt. abd soft nt. Coban dressing removed. No accumulation of hematoma.    Po fluids, meal.    As contused tissue, deep/extens lac, will rx abx. Keflex.       Suzi Roots, MD 08/04/11 1404  Suzi Roots, MD 08/04/11 [phone]

## 2011-08-04 NOTE — Discharge Instructions (Signed)
Icepack/cold to sore area. Keep head elevated as much as possible, even while sleeping, for the next few days to help with pain/swelling. Take tylenol/advil as need.  Take antibiotic (keflex) as prescribed to help prevent infection. Have sutures removed, your doctor or urgent care, in 8-10 days.  Your blood pressure is high today - follow up with primary care doctor for recheck in coming week.  Return to ER if worse, new symptoms, severe headache, vomiting, infection of wound, severe neck or back pain, other concern.     Facial Laceration A facial laceration is a cut on the face. Lacerations usually heal quickly, but they need special care to reduce scarring. It will take 1 to 2 years for the scar to lose its redness and to heal completely. TREATMENT  Some facial lacerations may not require closure. Some lacerations may not be able to be closed due to an increased risk of infection. It is important to see your caregiver as soon as possible after an injury to minimize the risk of infection and to maximize the opportunity for successful closure. If closure is appropriate, pain medicines may be given, if needed. The wound will be cleaned to help prevent infection. Your caregiver will use stitches (sutures), staples, wound glue (adhesive), or skin adhesive strips to repair the laceration. These tools bring the skin edges together to allow for faster healing and a better cosmetic outcome. However, all wounds will heal with a scar.  Once the wound has healed, scarring can be minimized by covering the wound with sunscreen during the day for 1 full year. Use a sunscreen with an SPF of at least 30. Sunscreen helps to reduce the pigment that will form in the scar. When applying sunscreen to a completely healed wound, massage the scar for a few minutes to help reduce the appearance of the scar. Use circular motions with your fingertips, on and around the scar. Do not massage a healing wound. HOME CARE  INSTRUCTIONS For sutures:  Keep the wound clean and dry.   If you were given a bandage (dressing), you should change it at least once a day. Also change the dressing if it becomes wet or dirty, or as directed by your caregiver.   Wash the wound with soap and water 2 times a day. Rinse the wound off with water to remove all soap. Pat the wound dry with a clean towel.   After cleaning, apply a thin layer of the antibiotic ointment recommended by your caregiver. This will help prevent infection and keep the dressing from sticking.   You may shower as usual after the first 24 hours. Do not soak the wound in water until the sutures are removed.   Only take over-the-counter or prescription medicines for pain, discomfort, or fever as directed by your caregiver.   Get your sutures removed as directed by your caregiver. With facial lacerations, sutures should usually be taken out after 4 to 5 days to avoid stitch marks.   Wait a few days after your sutures are removed before applying makeup.  For skin adhesive strips:  Keep the wound clean and dry.   Do not get the skin adhesive strips wet. You may bathe carefully, using caution to keep the wound dry.   If the wound gets wet, pat it dry with a clean towel.   Skin adhesive strips will fall off on their own. You may trim the strips as the wound heals. Do not remove skin adhesive strips that are still  stuck to the wound. They will fall off in time.  For wound adhesive:  You may briefly wet your wound in the shower or bath. Do not soak or scrub the wound. Do not swim. Avoid periods of heavy perspiration until the skin adhesive has fallen off on its own. After showering or bathing, gently pat the wound dry with a clean towel.   Do not apply liquid medicine, cream medicine, ointment medicine, or makeup to your wound while the skin adhesive is in place. This may loosen the film before your wound is healed.   If a dressing is placed over the wound,  be careful not to apply tape directly over the skin adhesive. This may cause the adhesive to be pulled off before the wound is healed.   Avoid prolonged exposure to sunlight or tanning lamps while the skin adhesive is in place. Exposure to ultraviolet light in the first year will darken the scar.   The skin adhesive will usually remain in place for 5 to 10 days, then naturally fall off the skin. Do not pick at the adhesive film.  You may need a tetanus shot if:  You cannot remember when you had your last tetanus shot.   You have never had a tetanus shot.  If you get a tetanus shot, your arm may swell, get red, and feel warm to the touch. This is common and not a problem. If you need a tetanus shot and you choose not to have one, there is a rare chance of getting tetanus. Sickness from tetanus can be serious. SEEK IMMEDIATE MEDICAL CARE IF:  You develop redness, pain, or swelling around the wound.   There is yellowish-white fluid (pus) coming from the wound.   You develop chills or a fever.  MAKE SURE YOU:  Understand these instructions.   Will watch your condition.   Will get help right away if you are not doing well or get worse.  Document Released: 06/08/2004 Document Revised: 04/20/2011 Document Reviewed: 10/24/2010 [facility] Patient Information 2012 [place], [place].     Head Injury, Adult You have had a head injury that does not appear serious at this time. A concussion is a state of changed mental ability, usually from a blow to the head. You should take clear liquids for the rest of the day and then resume your regular diet. You should not take sedatives or alcoholic beverages for as long as directed by your caregiver after discharge. After injuries such as yours, most problems occur within the first 24 hours. SYMPTOMS These minor symptoms may be experienced after discharge:  Memory difficulties.   Dizziness.   Headaches.   Double vision.   Hearing difficulties.    Depression.   Tiredness.   Weakness.   Difficulty with concentration.  If you experience any of these problems, you should not be alarmed. A concussion requires a few days for recovery. Many patients with head injuries frequently experience such symptoms. Usually, these problems disappear without medical care. If symptoms last for more than one day, notify your caregiver. See your caregiver sooner if symptoms are becoming worse rather than better. HOME CARE INSTRUCTIONS   During the next 24 hours you must stay with someone who can watch you for the warning signs listed below.  Although it is unlikely that serious side effects will occur, you should be aware of signs and symptoms which may necessitate your return to this location. Side effects may occur up to 7 - 10 days following  the injury. It is important for you to carefully monitor your condition and contact your caregiver or seek immediate medical attention if there is a change in your condition. SEEK IMMEDIATE MEDICAL CARE IF:   There is confusion or drowsiness.   You can not awaken the injured person.   There is nausea (feeling sick to your stomach) or continued, forceful vomiting.   You notice dizziness or unsteadiness which is getting worse, or inability to walk.   You have convulsions or unconsciousness.   You experience severe, persistent headaches not relieved by over-the-counter or prescription medicines for pain. (Do not take aspirin as this impairs clotting abilities). Take other pain medications only as directed.   You can not use arms or legs normally.   There is clear or bloody discharge from the nose or ears.  MAKE SURE YOU:   Understand these instructions.   Will watch your condition.   Will get help right away if you are not doing well or get worse.  Document Released: 05/01/2005 Document Revised: 04/20/2011 Document Reviewed: 03/19/2009 [facility] Patient Information 2012 [place],  [place].       Facial and Scalp Contusions You have a contusion (bruise) on your face or scalp. Injuries around the face and head generally cause a lot of swelling, especially around the eyes. This is because the blood supply to this area is good and tissues are loose. Swelling from a contusion is usually better in 2-3 days. It may take a week or longer for a "black eye" to clear up completely. HOME CARE INSTRUCTIONS   Apply ice packs to the injured area for about 15 to 20 minutes, 3 to 4 times a day, for the first couple days. This helps keep swelling down.   Use mild pain medicine as needed or instructed by your caregiver.   You may have a mild headache, slight dizziness, nausea, and weakness for a few days. This usually clears up with bed rest and mild pain medications.   Contact your caregiver if you are concerned about facial defects or have any difficulty with your bite or develop pain with chewing.  SEEK IMMEDIATE MEDICAL CARE IF:  You develop severe pain or a headache, unrelieved by medication.   You develop unusual sleepiness, confusion, personality changes, or vomiting.   You have a persistent nosebleed, double or blurred vision, or drainage from the nose or ear.   You have difficulty walking or using your arms or legs.  MAKE SURE YOU:   Understand these instructions.   Will watch your condition.   Will get help right away if you are not doing well or get worse.  Document Released: 06/08/2004 Document Revised: 04/20/2011 Document Reviewed: 03/17/2011 [facility] Patient Information 2012 [place], [place].      Cryotherapy Cryotherapy means treatment with cold. Ice or gel packs can be used to reduce both pain and swelling. Ice is the most helpful within the first 24 to 48 hours after an injury or flareup from overusing a muscle or joint. Sprains, strains, spasms, burning pain, shooting pain, and aches can all be eased with ice. Ice can also be used when recovering from  surgery. Ice is effective, has very few side effects, and is safe for most people to use. PRECAUTIONS  Ice is not a safe treatment option for people with:  Raynaud's phenomenon. This is a condition affecting small blood vessels in the extremities. Exposure to cold may cause your problems to return.   Cold hypersensitivity. There are many forms  of cold hypersensitivity, including:   Cold urticaria. Red, itchy hives appear on the skin when the tissues begin to warm after being iced.   Cold erythema. This is a red, itchy rash caused by exposure to cold.   Cold hemoglobinuria. Red blood cells break down when the tissues begin to warm after being iced. The hemoglobin that carry oxygen are passed into the urine because they cannot combine with blood proteins fast enough.   Numbness or altered sensitivity in the area being iced.  If you have any of the following conditions, do not use ice until you have discussed cryotherapy with your caregiver:  Heart conditions, such as arrhythmia, angina, or chronic heart disease.   High blood pressure.   Healing wounds or open skin in the area being iced.   Current infections.   Rheumatoid arthritis.   Poor circulation.   Diabetes.  Ice slows the blood flow in the region it is applied. This is beneficial when trying to stop inflamed tissues from spreading irritating chemicals to surrounding tissues. However, if you expose your skin to cold temperatures for too long or without the proper protection, you can damage your skin or nerves. Watch for signs of skin damage due to cold. HOME CARE INSTRUCTIONS Follow these tips to use ice and cold packs safely.  Place a dry or damp towel between the ice and skin. A damp towel will cool the skin more quickly, so you may need to shorten the time that the ice is used.   For a more rapid response, add gentle compression to the ice.   Ice for no more than 10 to 20 minutes at a time. The bonier the area you are  icing, the less time it will take to get the benefits of ice.   Check your skin after 5 minutes to make sure there are no signs of a poor response to cold or skin damage.   Rest 20 minutes or more in between uses.   Once your skin is numb, you can end your treatment. You can test numbness by very lightly touching your skin. The touch should be so light that you do not see the skin dimple from the pressure of your fingertip. When using ice, most people will feel these normal sensations in this order: cold, burning, aching, and numbness.   Do not use ice on someone who cannot communicate their responses to pain, such as small children or people with dementia.  HOW TO MAKE AN ICE PACK Ice packs are the most common way to use ice therapy. Other methods include ice massage, ice baths, and cryo-sprays. Muscle creams that cause a cold, tingly feeling do not offer the same benefits that ice offers and should not be used as a substitute unless recommended by your caregiver. To make an ice pack, do one of the following:  Place crushed ice or a bag of frozen vegetables in a sealable plastic bag. Squeeze out the excess air. Place this bag inside another plastic bag. Slide the bag into a pillowcase or place a damp towel between your skin and the bag.   Mix 3 parts water with 1 part rubbing alcohol. Freeze the mixture in a sealable plastic bag. When you remove the mixture from the freezer, it will be slushy. Squeeze out the excess air. Place this bag inside another plastic bag. Slide the bag into a pillowcase or place a damp towel between your skin and the bag.  SEEK MEDICAL CARE IF:  You develop white spots on your skin. This may give the skin a blotchy (mottled) appearance.   Your skin turns blue or pale.   Your skin becomes waxy or hard.   Your swelling gets worse.  MAKE SURE YOU:   Understand these instructions.   Will watch your condition.   Will get help right away if you are not doing well or  get worse.  Document Released: 12/26/2010 Document Revised: 04/20/2011 Document Reviewed: 12/26/2010 [facility] Patient Information 2012 [place], [place].

## 2011-08-04 NOTE — ED Notes (Signed)
Pt tripped on dog and fell hitting head on corner of file cabinet. Pt denies LOC.  Pt taking Aggrenox.  Bleeding continues at this time.

## 2011-08-04 NOTE — ED Notes (Signed)
MD made aware of pt arrival and condition.

## 2011-12-07 ENCOUNTER — Other Ambulatory Visit: Payer: Self-pay | Admitting: Endocrinology

## 2011-12-07 DIAGNOSIS — Z1231 Encounter for screening mammogram for malignant neoplasm of breast: Secondary | ICD-10-CM

## 2011-12-19 ENCOUNTER — Other Ambulatory Visit: Payer: Self-pay | Admitting: Endocrinology

## 2011-12-19 ENCOUNTER — Ambulatory Visit
Admission: RE | Admit: 2011-12-19 | Discharge: 2011-12-19 | Disposition: A | Discharge status: Home / Self Care | Payer: Medicare Other | Source: Ambulatory Visit | Attending: Endocrinology | Admitting: Endocrinology

## 2011-12-19 DIAGNOSIS — Z1231 Encounter for screening mammogram for malignant neoplasm of breast: Secondary | ICD-10-CM

## 2011-12-19 DIAGNOSIS — M8580 Other specified disorders of bone density and structure, unspecified site: Secondary | ICD-10-CM

## 2011-12-28 ENCOUNTER — Ambulatory Visit
Admission: RE | Admit: 2011-12-28 | Discharge: 2011-12-28 | Disposition: A | Discharge status: Home / Self Care | Payer: Medicare Other | Source: Ambulatory Visit | Attending: Endocrinology | Admitting: Endocrinology

## 2011-12-28 DIAGNOSIS — M8580 Other specified disorders of bone density and structure, unspecified site: Secondary | ICD-10-CM

## 2012-02-21 ENCOUNTER — Ambulatory Visit
Admission: RE | Admit: 2012-02-21 | Discharge: 2012-02-21 | Disposition: A | Discharge status: Home / Self Care | Payer: Medicare Other | Source: Ambulatory Visit | Attending: Endocrinology | Admitting: Endocrinology

## 2012-02-21 ENCOUNTER — Other Ambulatory Visit: Payer: Self-pay | Admitting: Endocrinology

## 2012-02-21 DIAGNOSIS — R0989 Other specified symptoms and signs involving the circulatory and respiratory systems: Secondary | ICD-10-CM

## 2012-02-21 DIAGNOSIS — R0609 Other forms of dyspnea: Secondary | ICD-10-CM

## 2012-02-21 LAB — PULMONARY FUNCTION TEST

## 2012-02-29 ENCOUNTER — Encounter: Payer: Self-pay | Admitting: Internal Medicine

## 2012-03-01 ENCOUNTER — Encounter: Payer: Self-pay | Admitting: Internal Medicine

## 2012-03-01 ENCOUNTER — Ambulatory Visit (INDEPENDENT_AMBULATORY_CARE_PROVIDER_SITE_OTHER)
Admission: RE | Admit: 2012-03-01 | Discharge: 2012-03-01 | Disposition: A | Discharge status: Home / Self Care | Payer: Medicare Other | Source: Ambulatory Visit | Attending: Internal Medicine | Admitting: Internal Medicine

## 2012-03-01 ENCOUNTER — Ambulatory Visit (INDEPENDENT_AMBULATORY_CARE_PROVIDER_SITE_OTHER): Payer: Medicare Other | Admitting: Internal Medicine

## 2012-03-01 VITALS — BP 138/60 | HR 68 | Temp 93.1°F | Ht 64.5 in | Wt 161.6 lb

## 2012-03-01 DIAGNOSIS — R0600 Dyspnea, unspecified: Secondary | ICD-10-CM

## 2012-03-01 DIAGNOSIS — R0609 Other forms of dyspnea: Secondary | ICD-10-CM

## 2012-03-01 DIAGNOSIS — R0989 Other specified symptoms and signs involving the circulatory and respiratory systems: Secondary | ICD-10-CM

## 2012-03-01 NOTE — Progress Notes (Signed)
  Subjective:    Patient ID: [name], female    DOB: 07-11-1924   MRN: [id number]  HPI  48 yowf quit smoking around 2000 with no resp problems with new onset doe since 2012 referred 03/01/2012 by Dr [name].  03/01/2012 1st pulmonary eval/ [name] cc doe x across the room x one year but only under two circumstances:  to go to the [facility] when wakes up at night to urinate (note does not wake up due to pnd) worse in am p stirs   but recovers p first hour better since started back on lasix  Despite min hx of any leg swelling and no correlation between severity of leg swelling and doe. Sob never occurs at rest. No obvious  other variabilty or assoc chronic cough or cp or chest tightness, subjective wheeze overt sinus or hb symptoms. No unusual exp hx or h/o childhood pna/ asthma or premature birth to her knowledge.   Also denies any obvious fluctuation of symptoms with weather or environmental changes or other aggravating or alleviating factors except as outlined above   Review of Systems  Constitutional: Negative for fever, chills and unexpected weight change.  HENT: Positive for postnasal drip. Negative for ear pain, nosebleeds, congestion, sore throat, rhinorrhea, sneezing, trouble swallowing, dental problem, voice change and sinus pressure.   Eyes: Positive for visual disturbance.  Respiratory: Negative for cough, choking and shortness of breath.   Cardiovascular: Positive for leg swelling. Negative for chest pain.  Gastrointestinal: Negative for vomiting, abdominal pain and diarrhea.  Genitourinary: Positive for difficulty urinating.  Musculoskeletal: Positive for arthralgias.  Skin: Negative for rash.  Neurological: Negative for tremors, syncope and headaches.  Hematological: Bruises/bleeds easily.       Objective:   Physical Exam  Well preserved pleasant amb wf nad Wt Readings from Last 3 Encounters:  03/01/12 161 lb 9.6 oz (73.301 kg)  08/04/11 168 lb (76.204 kg)    HEENT: nl  dentition, turbinates, and orophanx. Nl external ear canals without cough reflex   NECK :  without JVD/Nodes/TM/ nl carotid upstrokes bilaterally   LUNGS: no acc muscle use, clear to A and P bilaterally without cough on insp or exp maneuvers   CV:  RRR  no s3 or murmur or increase in P2, no edema   ABD:  soft and nontender with nl excursion in the supine position. No bruits or organomegaly, bowel sounds nl  MS:  warm without deformities, calf tenderness, cyanosis or clubbing  SKIN: warm and dry without lesions    NEURO:  alert, approp, no deficits    CXR  03/01/2012 :  Comparison: 02/21/2012  Findings: There has been continued reexpansion of the right lower lobe with persistent mild curvilinear atelectasis noted at the right lung base. Mild cardiomegaly noted. Hyperinflation may suggest COPD. Small right pleural effusion noted. No acute osseous finding.  IMPRESSION: Continued reexpansion of the right lower lobe.      Assessment & Plan:

## 2012-03-01 NOTE — Patient Instructions (Addendum)
Please see patient coordinator before you leave today  to schedule overnight 02 on RA  Please remember to go to the lab and x-ray department downstairs for your tests - we will call you with the results when they are available.    Follow Dr [name] within the next few weeks

## 2012-03-01 NOTE — Progress Notes (Signed)
Quick Note:  Spoke with pt and notified of results per Dr. Wert. Pt verbalized understanding and denied any questions.  ______ 

## 2012-03-02 NOTE — Assessment & Plan Note (Addendum)
-   Echo 11/13/08 c/w AS/MS and mod PAH - Spirometry 02/21/12 mostly restrictive changes with ratio 64 - ONO RA rec 03/02/2012   Her "spells" of sob are better p lasix and worse if she remains supine for hours classic for vol overload albeit unusual in that the sob does not wake her up but is described as doe p wakes up and no assoc leg swelling - she needs ono RA to be sure part of her pah isn't related to noct desat but nothing further from pulm perspective.  rec pursue cardiac and Rx first and if any questions re PAH would need Right Heart Cath by cards as next step in w/u before returning to pulmonary clinic

## 2012-03-06 ENCOUNTER — Encounter: Payer: Self-pay | Admitting: Internal Medicine

## 2012-03-06 ENCOUNTER — Telehealth: Payer: Self-pay | Admitting: Internal Medicine

## 2012-03-06 DIAGNOSIS — G4734 Idiopathic sleep related nonobstructive alveolar hypoventilation: Secondary | ICD-10-CM

## 2012-03-06 NOTE — Telephone Encounter (Signed)
Per MW needs 2 liters cont oxygen at night and repeat ONO w/ 2 liters. I have placed order for this. I called and spoke with pt and is aware of this. She agree'd to have oxygen set up.

## 2012-03-13 ENCOUNTER — Encounter: Payer: Self-pay | Admitting: Internal Medicine

## 2012-03-25 ENCOUNTER — Encounter: Payer: Self-pay | Admitting: Internal Medicine

## 2012-03-27 ENCOUNTER — Telehealth: Payer: Self-pay | Admitting: Internal Medicine

## 2012-03-27 NOTE — Telephone Encounter (Signed)
Per MW- ONO on 2lpm was good, needs to continue noct o2  ATC with pt with results, NA and so LMTCB

## 2012-04-02 NOTE — Telephone Encounter (Signed)
Spoke with pt and notified of results per Dr. Wert. Pt verbalized understanding and denied any questions. 

## 2012-04-10 ENCOUNTER — Encounter: Payer: Self-pay | Admitting: Internal Medicine

## 2012-04-15 ENCOUNTER — Ambulatory Visit: Payer: Medicare Other | Admitting: Pulmonary Disease

## 2012-04-15 ENCOUNTER — Inpatient Hospital Stay (HOSPITAL_COMMUNITY)
Admission: EM | Admit: 2012-04-15 | Discharge: 2012-04-21 | DRG: 291 | Disposition: A | Discharge status: Home / Self Care | Payer: Medicare Other | Attending: Internal Medicine | Admitting: Internal Medicine

## 2012-04-15 ENCOUNTER — Emergency Department (HOSPITAL_COMMUNITY): Payer: Medicare Other

## 2012-04-15 ENCOUNTER — Inpatient Hospital Stay (HOSPITAL_COMMUNITY): Payer: Medicare Other

## 2012-04-15 ENCOUNTER — Encounter (HOSPITAL_COMMUNITY): Payer: Self-pay | Admitting: *Deleted

## 2012-04-15 DIAGNOSIS — J90 Pleural effusion, not elsewhere classified: Secondary | ICD-10-CM | POA: Diagnosis present

## 2012-04-15 DIAGNOSIS — J9600 Acute respiratory failure, unspecified whether with hypoxia or hypercapnia: Secondary | ICD-10-CM | POA: Diagnosis present

## 2012-04-15 DIAGNOSIS — E559 Vitamin D deficiency, unspecified: Secondary | ICD-10-CM | POA: Diagnosis present

## 2012-04-15 DIAGNOSIS — J811 Chronic pulmonary edema: Secondary | ICD-10-CM | POA: Diagnosis present

## 2012-04-15 DIAGNOSIS — R0600 Dyspnea, unspecified: Secondary | ICD-10-CM | POA: Diagnosis present

## 2012-04-15 DIAGNOSIS — I10 Essential (primary) hypertension: Secondary | ICD-10-CM | POA: Diagnosis present

## 2012-04-15 DIAGNOSIS — Z8673 Personal history of transient ischemic attack (TIA), and cerebral infarction without residual deficits: Secondary | ICD-10-CM

## 2012-04-15 DIAGNOSIS — I509 Heart failure, unspecified: Secondary | ICD-10-CM | POA: Diagnosis present

## 2012-04-15 DIAGNOSIS — D72829 Elevated white blood cell count, unspecified: Secondary | ICD-10-CM | POA: Diagnosis present

## 2012-04-15 DIAGNOSIS — Z79899 Other long term (current) drug therapy: Secondary | ICD-10-CM

## 2012-04-15 DIAGNOSIS — I2789 Other specified pulmonary heart diseases: Secondary | ICD-10-CM | POA: Diagnosis present

## 2012-04-15 DIAGNOSIS — J9819 Other pulmonary collapse: Secondary | ICD-10-CM | POA: Diagnosis present

## 2012-04-15 DIAGNOSIS — Z87891 Personal history of nicotine dependence: Secondary | ICD-10-CM

## 2012-04-15 DIAGNOSIS — I119 Hypertensive heart disease without heart failure: Secondary | ICD-10-CM | POA: Diagnosis present

## 2012-04-15 DIAGNOSIS — J40 Bronchitis, not specified as acute or chronic: Secondary | ICD-10-CM | POA: Diagnosis present

## 2012-04-15 DIAGNOSIS — E039 Hypothyroidism, unspecified: Secondary | ICD-10-CM | POA: Diagnosis present

## 2012-04-15 DIAGNOSIS — E785 Hyperlipidemia, unspecified: Secondary | ICD-10-CM | POA: Diagnosis present

## 2012-04-15 DIAGNOSIS — J189 Pneumonia, unspecified organism: Secondary | ICD-10-CM | POA: Diagnosis present

## 2012-04-15 DIAGNOSIS — I080 Rheumatic disorders of both mitral and aortic valves: Secondary | ICD-10-CM | POA: Diagnosis present

## 2012-04-15 DIAGNOSIS — I501 Left ventricular failure: Principal | ICD-10-CM | POA: Diagnosis present

## 2012-04-15 LAB — CBC
Hemoglobin: 11.7 g/dL — ABNORMAL LOW (ref 12.0–15.0)
MCH: 30.6 pg (ref 26.0–34.0)
RBC: 3.82 MIL/uL — ABNORMAL LOW (ref 3.87–5.11)

## 2012-04-15 LAB — COMPREHENSIVE METABOLIC PANEL
ALT: 11 U/L (ref 0–35)
Alkaline Phosphatase: 75 U/L (ref 39–117)
CO2: 31 mEq/L (ref 19–32)
Chloride: 97 mEq/L (ref 96–112)
GFR calc Af Amer: 63 mL/min — ABNORMAL LOW (ref >90)
GFR calc non Af Amer: 54 mL/min — ABNORMAL LOW (ref >90)
Glucose, Bld: 183 mg/dL — ABNORMAL HIGH (ref 70–99)
Potassium: 3.7 mEq/L (ref 3.5–5.1)
Sodium: 138 mEq/L (ref 135–145)
Total Bilirubin: 0.3 mg/dL (ref 0.3–1.2)

## 2012-04-15 LAB — URINALYSIS, ROUTINE W REFLEX MICROSCOPIC
Glucose, UA: NEGATIVE mg/dL
Hgb urine dipstick: NEGATIVE
Ketones, ur: NEGATIVE mg/dL
Protein, ur: NEGATIVE mg/dL

## 2012-04-15 LAB — PHOSPHORUS: Phosphorus: 3.2 mg/dL (ref 2.3–4.6)

## 2012-04-15 LAB — TSH: TSH: 2.445 u[IU]/mL (ref 0.350–4.500)

## 2012-04-15 LAB — MAGNESIUM: Magnesium: 1.8 mg/dL (ref 1.5–2.5)

## 2012-04-15 MED ORDER — ASPIRIN-DIPYRIDAMOLE ER 25-200 MG PO CP12
1.0000 | ORAL_CAPSULE | Freq: Two times a day (BID) | ORAL | Status: DC
  Administered 2012-04-15 – 2012-04-21 (×12): 1 via ORAL
  Filled 2012-04-15 (×14): qty 1

## 2012-04-15 MED ORDER — SODIUM CHLORIDE 0.9 % IV SOLN
INTRAVENOUS | Status: DC
  Administered 2012-04-15: 12:00:00 via INTRAVENOUS

## 2012-04-15 MED ORDER — CALCIUM CARBONATE 1250 (500 CA) MG PO TABS
1.0000 | ORAL_TABLET | Freq: Every day | ORAL | Status: DC
  Administered 2012-04-16 – 2012-04-21 (×6): 500 mg via ORAL
  Filled 2012-04-15 (×6): qty 1

## 2012-04-15 MED ORDER — DEXTROSE 5 % IV SOLN
1.0000 g | INTRAVENOUS | Status: DC
  Administered 2012-04-15: 1 g via INTRAVENOUS
  Filled 2012-04-15: qty 10

## 2012-04-15 MED ORDER — ADULT MULTIVITAMIN W/MINERALS CH
1.0000 | ORAL_TABLET | Freq: Every day | ORAL | Status: DC
  Administered 2012-04-16 – 2012-04-21 (×6): 1 via ORAL
  Filled 2012-04-15 (×8): qty 1

## 2012-04-15 MED ORDER — FUROSEMIDE 10 MG/ML IJ SOLN
20.0000 mg | Freq: Once | INTRAMUSCULAR | Status: AC
  Administered 2012-04-15: 20 mg via INTRAVENOUS
  Filled 2012-04-15: qty 4

## 2012-04-15 MED ORDER — ALBUTEROL SULFATE (5 MG/ML) 0.5% IN NEBU: 5.0000 mg | INHALATION_SOLUTION | RESPIRATORY_TRACT | Status: DC | PRN

## 2012-04-15 MED ORDER — SODIUM CHLORIDE 0.9 % IJ SOLN: 3.0000 mL | INTRAMUSCULAR | Status: DC | PRN

## 2012-04-15 MED ORDER — ALBUTEROL SULFATE (5 MG/ML) 0.5% IN NEBU
5.0000 mg | INHALATION_SOLUTION | Freq: Once | RESPIRATORY_TRACT | Status: AC
  Administered 2012-04-15: 5 mg via RESPIRATORY_TRACT
  Filled 2012-04-15: qty 1

## 2012-04-15 MED ORDER — METHOCARBAMOL 500 MG PO TABS: 500.0000 mg | ORAL_TABLET | Freq: Three times a day (TID) | ORAL | Status: DC | PRN

## 2012-04-15 MED ORDER — FOLIC ACID 1 MG PO TABS
1.0000 mg | ORAL_TABLET | Freq: Every day | ORAL | Status: DC
  Administered 2012-04-15 – 2012-04-21 (×7): 1 mg via ORAL
  Filled 2012-04-15 (×7): qty 1

## 2012-04-15 MED ORDER — SODIUM CHLORIDE 0.9 % IV SOLN: 250.0000 mL | INTRAVENOUS | Status: DC | PRN

## 2012-04-15 MED ORDER — LEVOTHYROXINE SODIUM 100 MCG PO TABS
100.0000 ug | ORAL_TABLET | Freq: Every day | ORAL | Status: DC
  Administered 2012-04-16 – 2012-04-21 (×6): 100 ug via ORAL
  Filled 2012-04-15 (×7): qty 1

## 2012-04-15 MED ORDER — BIOTENE DRY MOUTH MT LIQD
15.0000 mL | Freq: Two times a day (BID) | OROMUCOSAL | Status: DC
  Administered 2012-04-15 – 2012-04-21 (×12): 15 mL via OROMUCOSAL

## 2012-04-15 MED ORDER — FERROUS SULFATE 325 (65 FE) MG PO TABS
325.0000 mg | ORAL_TABLET | Freq: Every day | ORAL | Status: DC
  Administered 2012-04-15 – 2012-04-21 (×7): 325 mg via ORAL
  Filled 2012-04-15 (×7): qty 1

## 2012-04-15 MED ORDER — HYDROCODONE-ACETAMINOPHEN 5-325 MG PO TABS
1.0000 | ORAL_TABLET | ORAL | Status: DC | PRN
  Filled 2012-04-15: qty 1

## 2012-04-15 MED ORDER — DEXTROSE 5 % IV SOLN
1.0000 g | INTRAVENOUS | Status: DC
  Filled 2012-04-15: qty 10

## 2012-04-15 MED ORDER — SIMVASTATIN 40 MG PO TABS
40.0000 mg | ORAL_TABLET | Freq: Every evening | ORAL | Status: DC
  Administered 2012-04-15 – 2012-04-20 (×6): 40 mg via ORAL
  Filled 2012-04-15 (×7): qty 1

## 2012-04-15 MED ORDER — IPRATROPIUM BROMIDE 0.02 % IN SOLN
0.5000 mg | Freq: Once | RESPIRATORY_TRACT | Status: AC
  Administered 2012-04-15: 0.5 mg via RESPIRATORY_TRACT
  Filled 2012-04-15: qty 2.5

## 2012-04-15 MED ORDER — ONDANSETRON HCL 4 MG/2ML IJ SOLN: 4.0000 mg | Freq: Four times a day (QID) | INTRAMUSCULAR | Status: DC | PRN

## 2012-04-15 MED ORDER — OCUVITE PO TABS
1.0000 | ORAL_TABLET | Freq: Every day | ORAL | Status: DC
  Administered 2012-04-16 – 2012-04-21 (×6): 1 via ORAL
  Filled 2012-04-15 (×6): qty 1

## 2012-04-15 MED ORDER — DEXTROSE 5 % IV SOLN
500.0000 mg | INTRAVENOUS | Status: DC
  Filled 2012-04-15: qty 500

## 2012-04-15 MED ORDER — FUROSEMIDE 10 MG/ML IJ SOLN
20.0000 mg | Freq: Every day | INTRAMUSCULAR | Status: DC
  Administered 2012-04-16: 20 mg via INTRAVENOUS
  Filled 2012-04-15 (×2): qty 2

## 2012-04-15 MED ORDER — SODIUM CHLORIDE 0.9 % IJ SOLN
3.0000 mL | Freq: Two times a day (BID) | INTRAMUSCULAR | Status: DC
  Administered 2012-04-15 – 2012-04-18 (×8): 3 mL via INTRAVENOUS

## 2012-04-15 MED ORDER — TRAMADOL HCL 50 MG PO TABS
50.0000 mg | ORAL_TABLET | Freq: Three times a day (TID) | ORAL | Status: DC
  Administered 2012-04-15 – 2012-04-20 (×17): 50 mg via ORAL
  Filled 2012-04-15 (×21): qty 1

## 2012-04-15 MED ORDER — SODIUM CHLORIDE 0.9 % IJ SOLN: 3.0000 mL | Freq: Two times a day (BID) | INTRAMUSCULAR | Status: DC

## 2012-04-15 MED ORDER — METOPROLOL TARTRATE 25 MG PO TABS
25.0000 mg | ORAL_TABLET | Freq: Every day | ORAL | Status: DC
  Administered 2012-04-16: 25 mg via ORAL
  Filled 2012-04-15 (×2): qty 1

## 2012-04-15 MED ORDER — ONDANSETRON HCL 4 MG PO TABS: 4.0000 mg | ORAL_TABLET | Freq: Four times a day (QID) | ORAL | Status: DC | PRN

## 2012-04-15 MED ORDER — AZITHROMYCIN 500 MG PO TABS
500.0000 mg | ORAL_TABLET | ORAL | Status: DC
  Administered 2012-04-15: 500 mg via ORAL
  Filled 2012-04-15: qty 1
  Filled 2012-04-15: qty 2

## 2012-04-15 MED ORDER — PANTOPRAZOLE SODIUM 40 MG PO TBEC
40.0000 mg | DELAYED_RELEASE_TABLET | Freq: Every day | ORAL | Status: DC
  Administered 2012-04-15 – 2012-04-21 (×7): 40 mg via ORAL
  Filled 2012-04-15 (×8): qty 1

## 2012-04-15 MED ORDER — VITAMIN D3 25 MCG (1000 UNIT) PO TABS
1000.0000 [IU] | ORAL_TABLET | Freq: Every day | ORAL | Status: DC
  Administered 2012-04-16 – 2012-04-21 (×6): 1000 [IU] via ORAL
  Filled 2012-04-15 (×6): qty 1

## 2012-04-15 NOTE — H&P (Addendum)
[facility] History and Physical  [name]:[id number] DOB: [birth year] DOA: 04/15/2012  Referring physician: ED physician PCP: [name], MD   Chief Complaint: Shortness of breath   HPI:  Pt is 78 yo female who presents to [facility] ED with main concern of progressively worsening shortness of breath that initially started one week prior to admission, associated with 2-3 pillow orthopnea, mild lower extremity swelling, cough intermittently productive of yellow sputum. Pt reports similar episode one year ago when she was diagnosed with pneumonia. She denies any chest pain, abdominal or urinary concerns, no fevers, chills, other systemic symptoms, no weight loss or gain as far as she knows.  Assessment and Plan:  Active Problems:  Dyspnea - unclear etiology but possibly multifactorial in etiology, secondary to CHF exacerbation but no recent 2 D ECHO  - pt is on Lasix at home and will start Lasix IV here and monitor clinical response - will also obtain 2D ECHO - right side pleural effusion is of unclear etiology and possibly separate from CHF - will order CT chest for further evaluation - in the meantime, will start also empiric antibiotics as pt had some symptoms suggestive of infectious etiology as well - may d/c ABX if no sputum analysis unremarkable - admit to telemetry floor   Code Status: Full Family Communication: Pt at bedside Disposition Plan: Admit to telemetry floor   Review of Systems:  Constitutional: Negative for fever, chills and malaise/fatigue. Negative for diaphoresis.  HENT: Negative for hearing loss, ear pain, nosebleeds, congestion, sore throat, neck pai , tinnitus and ear discharge.   Eyes: Negative for blurred vision, double vision, photophobia, pain, discharge and redness.  Respiratory: Negative for hemoptysis, positive for shortness of breath, productive cough of yellow sputum  Cardiovascular: Negative for chest pain, palpitations, positive for 2- pillow   orthopnea, leg swelling.  Gastrointestinal: Negative for nausea, vomiting and abdominal pain. Negative for heartburn, constipation, blood in stool and melena.  Genitourinary: Negative for dysuria, urgency, frequency, hematuria and flank pain.  Musculoskeletal: Negative for myalgias, back pain, joint pain and falls.  Skin: Negative for itching and rash.  Neurological: Negative for dizziness and weakness. tingling, tremors, sensory change, speech change, focal weakness, loss of consciousness and headaches.  Endo/Heme/Allergies: Negative for environmental allergies and polydipsia. Does not bruise/bleed easily.  Psychiatric/Behavioral: Negative for suicidal ideas. The patient is not nervous/anxious.      Past Medical History  Diagnosis Date  . Hypertension   . Stroke   . Anemia   . Hyperlipidemia   . Vitamin D deficiency   . Hypothyroid     History reviewed. No pertinent past surgical history.  Social History:  reports that she quit smoking about 13 years ago. Her smoking use included Cigarettes. She has a 25 pack-year smoking history. She has never used smokeless tobacco. She reports that she does not drink alcohol or use illicit drugs.  Allergies  Allergen Reactions  . Lisinopril     pancreatitis    Family History  Problem Relation Age of Onset  . Heart disease Mother   . Heart disease Father   . Heart disease Brother   . Breast cancer Daughter   . Lymphoma Brother    Medication Sig  beta carotene w/minerals (OCUVITE) tablet Take 1 tablet by mouth daily.  calcium carbonate 1250 MG tablet Take 1 tablet by mouth daily.  cholecalciferol (VITAMIN D) 1000 UNITS tablet Take 1,000 Units by mouth daily.  Dipyridamole 25-200 MG per 12 hr capsule Take 1  capsule by mouth 2 (two) times daily.  Ferrous Sulfate (IRON) 325 (65 FE) MG TABS Take 1 tablet by mouth daily.  folic acid (FOLVITE) 1 MG tablet Take 1 mg by mouth daily.  furosemide (LASIX) 20 MG tablet Take 20 mg by mouth every  other day.  glucosamine-chondroitin 500-400 MG tablet Take 1 tablet by mouth 3 (three) times daily.  levothyroxine 100 MCG tablet Take 100 mcg by mouth daily.  methocarbamol (ROBAXIN) 500 MG tablet Take 500 mg by mouth 3 times daily as needed.   metoprolol tartrate (LOPRESSOR) 25 MG tablet Take 25 mg by mouth daily.   Multiple Vitamin (MULITIVITAMIN WITH  Take 1 tablet by mouth daily.  omeprazole (PRILOSEC) 40 MG capsule Take 40 mg by mouth daily.  simvastatin (ZOCOR) 40 MG tablet Take 40 mg by mouth every evening.  traMADol (ULTRAM) 50 MG tablet Take 50 mg by mouth 3 (three) times daily.   Physical Exam: Filed Vitals:   04/15/12 1041  BP: 176/78  Pulse: 92  Temp: 97.7 F (36.5 C)  TempSrc: Axillary  Resp: 16  SpO2: 95%    Physical Exam  Constitutional: Appears well-developed and well-nourished. No distress.  HENT: Normocephalic. External right and left ear normal. Oropharynx is clear and moist.  Eyes: Conjunctivae and EOM are normal. PERRLA, no scleral icterus.  Neck: Normal ROM. Neck supple. No JVD. No tracheal deviation. No thyromegaly.  CVS: RRR, S1/S2 +, no murmurs, no gallops, no carotid bruit.  Pulmonary: Poor breath sounds on the right side, better on the left, no wheezing  Abdominal: Soft. BS +,  no distension, tenderness, rebound or guarding.  Musculoskeletal: Normal range of motion. +1 bilateral pitting edema  Lymphadenopathy: No lymphadenopathy noted, cervical, inguinal. Neuro: Alert. Normal reflexes, muscle tone coordination. No cranial nerve deficit. Skin: Skin is warm and dry. No rash noted. Not diaphoretic. No erythema. No pallor.  Psychiatric: Normal mood and affect. Behavior, judgment, thought content normal.   Labs on Admission:  Basic Metabolic Panel:  Lab 04/15/12 3086  NA 138  K 3.7  CL 97  CO2 31  GLUCOSE 183*  BUN 25*  CREATININE 0.93  CALCIUM 9.3  MG --  PHOS --   Liver Function Tests:  Lab 04/15/12 1113  AST 20  ALT 11  ALKPHOS 75    BILITOT 0.3  PROT 7.0  ALBUMIN 3.1*   CBC:  Lab 04/15/12 1113  WBC 7.7  NEUTROABS --  HGB 11.7*  HCT 35.4*  MCV 92.7  PLT 254   Cardiac Enzymes:  Lab 04/15/12 1113  CKTOTAL --  CKMB --  CKMBINDEX --  TROPONINI <0.30   Radiological Exams on Admission: Dg Chest 2 View 04/15/2012   Cardiomegaly, question interstitial edema.   Worsening right pleural effusion, now moderate.   Increasing right lower lobe atelectasis or consolidation.     EKG: Normal sinus rhythm, no ST/T wave changes  Debbora Presto, MD  [facility] Pager [phone]  If 7PM-7AM, please contact night-coverage [email or web address] Password [facility] 04/15/2012, 12:53 PM

## 2012-04-15 NOTE — ED Provider Notes (Addendum)
History     CSN: [id number]  Arrival date & time 04/15/12  1030   First MD Initiated Contact with Patient 04/15/12 1053      Chief Complaint  Patient presents with  . Shortness of Breath    (Consider location/radiation/quality/duration/timing/severity/associated sxs/prior treatment) Patient is a 78 y.o. female presenting with shortness of breath. The history is provided by the patient.  Shortness of Breath  Associated symptoms include cough and shortness of breath. Pertinent negatives include no chest pain and no fever.  pt w hx ?chf, presents stating increased sob over past week. Gradual onset. Persistent. Worse w exertion, better w rest. Also notes chronic non productive cough, sl worse in past week. Denies chest pain. No orthopnea or pnd. Pt states for past year has felt sob at night, worse w walking to bathroom. States had seen pcp and Dr Sherene Sires w same, was prescribed lasix and home o2 2 liters at night, which has helped somewhat, but symptoms have persisted. Denies fever or chills. No leg pain or swelling. No dvt or pe hx. ?pnd. States compliant w mes, denies recent change in meds. Denies hx asthma or copd, was former smoker quitting approximately 13 yrs ago. Denies hx cad.       Past Medical History  Diagnosis Date  . Hypertension   . Stroke   . Anemia   . Hyperlipidemia   . Vitamin D deficiency   . Hypothyroid     History reviewed. No pertinent past surgical history.  Family History  Problem Relation Age of Onset  . Heart disease Mother   . Heart disease Father   . Heart disease Brother   . Breast cancer Daughter   . Lymphoma Brother     History  Substance Use Topics  . Smoking status: Former Smoker -- 0.5 packs/day for 50 years    Types: Cigarettes    Quit date: 05/15/1998  . Smokeless tobacco: Never Used  . Alcohol Use: No    OB History    Grav Para Term Preterm Abortions TAB SAB Ect Mult Living                  Review of Systems  Constitutional:  Negative for fever and chills.  HENT: Negative for neck pain.   Eyes: Negative for redness.  Respiratory: Positive for cough and shortness of breath.   Cardiovascular: Negative for chest pain and leg swelling.  Gastrointestinal: Negative for abdominal pain.  Genitourinary: Negative for flank pain.  Musculoskeletal: Negative for back pain.  Skin: Negative for rash.  Neurological: Negative for headaches.  Hematological: Does not bruise/bleed easily.  Psychiatric/Behavioral: Negative for confusion.    Allergies  Lisinopril  Home Medications   Current Outpatient Rx  Name  Route  Sig  Dispense  Refill  . OCUVITE PO TABS   Oral   Take 1 tablet by mouth daily.         Marland Kitchen CALCIUM CARBONATE 1250 MG PO TABS   Oral   Take 1 tablet by mouth daily.         Marland Kitchen VITAMIN D 1000 UNITS PO TABS   Oral   Take 1,000 Units by mouth daily.         . ASPIRIN-DIPYRIDAMOLE ER 25-200 MG PO CP12   Oral   Take 1 capsule by mouth 2 (two) times daily.         . IRON 325 (65 FE) MG PO TABS   Oral   Take 1 tablet by mouth  daily.         Marland Kitchen FOLIC ACID 1 MG PO TABS   Oral   Take 1 mg by mouth daily.         Marland Kitchen GLUCOSAMINE-CHONDROITIN 500-400 MG PO TABS   Oral   Take 1 tablet by mouth 3 (three) times daily.         Marland Kitchen LEVOTHYROXINE SODIUM 100 MCG PO TABS   Oral   Take 100 mcg by mouth daily.         Marland Kitchen METOPROLOL TARTRATE 25 MG PO TABS   Oral   Take 25 mg by mouth 2 (two) times daily.         . ADULT MULTIVITAMIN W/MINERALS CH   Oral   Take 1 tablet by mouth daily.         Marland Kitchen OMEPRAZOLE 40 MG PO CPDR   Oral   Take 40 mg by mouth daily.         Marland Kitchen SIMVASTATIN 40 MG PO TABS   Oral   Take 40 mg by mouth every evening.         Marland Kitchen TRAMADOL HCL 50 MG PO TABS   Oral   Take 50 mg by mouth 3 (three) times daily. For pain relief           BP 176/78  Pulse 92  Temp 97.7 F (36.5 C) (Axillary)  Resp 16  SpO2 95%  Physical Exam  Nursing note and vitals  reviewed. Constitutional: She is oriented to person, place, and time. She appears well-developed and well-nourished. No distress.  HENT:  Mouth/Throat: Oropharynx is clear and moist.  Eyes: Conjunctivae normal are normal. No scleral icterus.  Neck: Neck supple. No JVD present. No tracheal deviation present.  Cardiovascular: Normal rate, regular rhythm, normal heart sounds and intact distal pulses.   Pulmonary/Chest: Effort normal. No respiratory distress. She has rales.       Decreased bs right base. bibasil rales  Abdominal: Soft. Normal appearance. She exhibits no distension. There is no tenderness.  Musculoskeletal: She exhibits tenderness.       Trace bil ankle edema  Neurological: She is alert and oriented to person, place, and time.  Skin: Skin is warm and dry. No rash noted.  Psychiatric: She has a normal mood and affect.    ED Course  Procedures (including critical care time)   Labs Reviewed  CBC  COMPREHENSIVE METABOLIC PANEL  PRO B NATRIURETIC PEPTIDE  TROPONIN I  URINALYSIS, ROUTINE W REFLEX MICROSCOPIC    Results for orders placed during the hospital encounter of 04/15/12  CBC      Component Value Range   WBC 7.7  4.0 - 10.5 K/uL   RBC 3.82 (*) 3.87 - 5.11 MIL/uL   Hemoglobin 11.7 (*) 12.0 - 15.0 g/dL   HCT 16.1 (*) 09.6 - 04.5 %   MCV 92.7  78.0 - 100.0 fL   MCH 30.6  26.0 - 34.0 pg   MCHC 33.1  30.0 - 36.0 g/dL   RDW 40.9  81.1 - 91.4 %   Platelets 254  150 - 400 K/uL  COMPREHENSIVE METABOLIC PANEL      Component Value Range   Sodium 138  135 - 145 mEq/L   Potassium 3.7  3.5 - 5.1 mEq/L   Chloride 97  96 - 112 mEq/L   CO2 31  19 - 32 mEq/L   Glucose, Bld 183 (*) 70 - 99 mg/dL   BUN 25 (*) 6 - 23  mg/dL   Creatinine, Ser 6.44  0.50 - 1.10 mg/dL   Calcium 9.3  8.4 - 03.4 mg/dL   Total Protein 7.0  6.0 - 8.3 g/dL   Albumin 3.1 (*) 3.5 - 5.2 g/dL   AST 20  0 - 37 U/L   ALT 11  0 - 35 U/L   Alkaline Phosphatase 75  39 - 117 U/L   Total Bilirubin 0.3   0.3 - 1.2 mg/dL   GFR calc non Af Amer 54 (*) >90 mL/min   GFR calc Af Amer 63 (*) >90 mL/min  PRO B NATRIURETIC PEPTIDE      Component Value Range   Pro B Natriuretic peptide (BNP) 4040.0 (*) 0 - 450 pg/mL  TROPONIN I      Component Value Range   Troponin I <0.30  <0.30 ng/mL   Dg Chest 2 View  04/15/2012  *RADIOLOGY REPORT*  Clinical Data: Shortness of breath, weakness.  CHEST - 2 VIEW  Comparison: 03/01/2012  Findings: Heart is enlarged.  Increasing right lower lobe airspace disease and right effusion.  Minimal left base scarring or atelectasis, similar to prior study.  Mild vascular congestion. Diffuse interstitial prominence has increased and may represent interstitial edema.  IMPRESSION: Cardiomegaly, question interstitial edema.  Worsening right pleural effusion, now moderate.  Increasing right lower lobe atelectasis or consolidation.   Original Report Authenticated By: Charlett Nose, M.D.       MDM  Iv ns. Labs. Cxr.   Reviewed nursing notes and prior charts for additional history.   bnp elevated, will give pt her lasix dose iv, lasix 20 iv.   Given worsening cough, basilar rales, ?infil on cxr, will also cover w abx for possible cap.   Rocephin and zithromax iv.   Alb/atr neb.  Med service called for admission.     Date: 04/15/2012  Rate: 76  Rhythm: normal sinus rhythm  QRS Axis: normal  Intervals: normal  ST/T Wave abnormalities: nonspecific ST changes  Conduction Disutrbances:none  Narrative Interpretation:   Old EKG Reviewed: changes noted    Suzi Roots, MD 04/15/12 1210  Suzi Roots, MD 04/15/12 1409

## 2012-04-15 NOTE — ED Notes (Signed)
AVW:[id number]<XB> Expected date:<BR> Expected time:<BR> Means of arrival:<BR> Comments:<BR> SOB/was off her O2

## 2012-04-15 NOTE — ED Notes (Signed)
Per ems: pt reports hx of SOB, uses o2 (2L [facility]) at home for past few months. Reports staying at daughters house last night, did not have oxygen. Reports sob and "tightness" throughout chest since last night. Peripheral edema present, bp 170/78, pulse 85. Initial saO2 89, now 96% on 2L. Per ems, rales on left lung fields

## 2012-04-15 NOTE — Evaluation (Signed)
Physical Therapy Evaluation Patient Details Name: [name] MRN: [id number] DOB: [birth year] Today's Date: 04/15/2012 Time: 4098-1191 PT Time Calculation (min): 14 min  PT Assessment / Plan / Recommendation Clinical Impression  78 yo female admitted with SOB. Usually only wears O2 at night. Pt is living with daughter temporarily while husband is in rehab. On eval,, pt required Min assist to stand and Min-guard to ambulate with RW. O2 sats dropped to 80% on RA. Anticipate pt will progress well during stay. Do not anticipate any PT needs after discharge at this time. Will follow during stay.     PT Assessment  Patient needs continued PT services    Follow Up Recommendations  No PT follow up    Does the patient have the potential to tolerate intense rehabilitation      Barriers to Discharge        Equipment Recommendations  None recommended by PT    Recommendations for Other Services OT consult   Frequency Min 3X/week    Precautions / Restrictions Precautions Precautions: Fall Restrictions Weight Bearing Restrictions: No   Pertinent Vitals/Pain 91% on 2L at rest; 80% on RA during ambulation; 91% on 2L O2 at rest after ambulation      Mobility  Bed Mobility Bed Mobility: Sit to Supine Sit to Supine: 6: Modified independent (Device/Increase time) Transfers Transfers: Sit to Stand;Stand to Sit Sit to Stand: From bed;From elevated surface;4: Min assist Stand to Sit: To bed;To elevated surface;4: Min guard Details for Transfer Assistance: VCs safety Ambulation/Gait Ambulation/Gait Assistance: 4: Min guard Ambulation Distance (Feet): 60 Feet Assistive device: Rolling walker Ambulation/Gait Assistance Details: VCs safety. Pt reports "knees shaky" so used RW for safety. Pt tolerated well, but O2 sats dropped to 80% on RA while ambulating. Dyspnea 2/4.  Gait Pattern: Step-through pattern    Shoulder Instructions     Exercises     PT Diagnosis: Difficulty  walking;Generalized weakness  PT Problem List: Decreased strength;Decreased activity tolerance;Decreased mobility;Cardiopulmonary status limiting activity PT Treatment Interventions: DME instruction;Gait training;Functional mobility training;Therapeutic activities;Therapeutic exercise;Patient/family education;Stair training   PT Goals Acute Rehab PT Goals PT Goal Formulation: With patient/family Time For Goal Achievement: 04/29/12 Potential to Achieve Goals: Good Pt will go Supine/Side to Sit: with modified independence PT Goal: Supine/Side to Sit - Progress: Goal set today Pt will go Sit to Supine/Side: with modified independence PT Goal: Sit to Supine/Side - Progress: Goal set today Pt will go Sit to Stand: with modified independence PT Goal: Sit to Stand - Progress: Goal set today Pt will Ambulate: 51 - 150 feet;with modified independence;with least restrictive assistive device (with O2 sats > 90%) PT Goal: Ambulate - Progress: Goal set today Pt will Go Up / Down Stairs: 3-5 stairs;with rail(s);with supervision PT Goal: Up/Down Stairs - Progress: Goal set today  Visit Information  Last PT Received On: 04/15/12 Assistance Needed: +1    Subjective Data  Subjective: "I already feel better" Patient Stated Goal: Home soon   Prior Functioning  Home Living Lives With: Spouse;Daughter (lives with spouse usually, but staying with daughter temp) Available Help at Discharge: Family Type of Home: House Home Access: Stairs to enter Secretary/administrator of Steps: 3 Entrance Stairs-Rails: Right Home Layout: One level Home Adaptive Equipment: Walker - rolling;Straight cane Additional Comments: daughter's home environment info since pt is staying with her.  Prior Function Level of Independence: Needs assistance Needs Assistance: Meal Prep;Light Housekeeping Comments: daughter has been performing meal prep and housekeeping since pt has been staying with  her temporarily. Normally, pt has  been doing these activities independently Communication Communication: No difficulties    Cognition  Overall Cognitive Status: Appears within functional limits for tasks assessed/performed Arousal/Alertness: Awake/alert Orientation Level: Appears intact for tasks assessed Behavior During Session: [facility] for tasks performed    Extremity/Trunk Assessment Right Lower Extremity Assessment RLE ROM/Strength/Tone: Deficits RLE ROM/Strength/Tone Deficits: strength at least 4/5 with functional activity Left Lower Extremity Assessment LLE ROM/Strength/Tone: Deficits LLE ROM/Strength/Tone Deficits: strength at least 4/5 with functional activity Trunk Assessment Trunk Assessment: Normal   Balance    End of Session PT - End of Session Activity Tolerance: Patient tolerated treatment well Patient left: in bed;with call bell/phone within reach;with family/visitor present  GP     Rebeca Alert [facility] 04/15/2012, 2:54 PM [id number]

## 2012-04-16 DIAGNOSIS — I10 Essential (primary) hypertension: Secondary | ICD-10-CM

## 2012-04-16 DIAGNOSIS — I119 Hypertensive heart disease without heart failure: Secondary | ICD-10-CM | POA: Diagnosis present

## 2012-04-16 DIAGNOSIS — J90 Pleural effusion, not elsewhere classified: Secondary | ICD-10-CM | POA: Diagnosis present

## 2012-04-16 DIAGNOSIS — J811 Chronic pulmonary edema: Secondary | ICD-10-CM

## 2012-04-16 DIAGNOSIS — E039 Hypothyroidism, unspecified: Secondary | ICD-10-CM | POA: Diagnosis present

## 2012-04-16 DIAGNOSIS — I059 Rheumatic mitral valve disease, unspecified: Secondary | ICD-10-CM

## 2012-04-16 HISTORY — DX: Hypothyroidism, unspecified: E03.9

## 2012-04-16 LAB — CBC
Hemoglobin: 10.7 g/dL — ABNORMAL LOW (ref 12.0–15.0)
MCHC: 32.7 g/dL (ref 30.0–36.0)
RBC: 3.47 MIL/uL — ABNORMAL LOW (ref 3.87–5.11)
WBC: 6.7 10*3/uL (ref 4.0–10.5)

## 2012-04-16 LAB — STREP PNEUMONIAE URINARY ANTIGEN: Strep Pneumo Urinary Antigen: NEGATIVE

## 2012-04-16 LAB — BASIC METABOLIC PANEL
BUN: 23 mg/dL (ref 6–23)
GFR calc Af Amer: 55 mL/min — ABNORMAL LOW (ref >90)
GFR calc non Af Amer: 47 mL/min — ABNORMAL LOW (ref >90)
Potassium: 3.9 mEq/L (ref 3.5–5.1)
Sodium: 140 mEq/L (ref 135–145)

## 2012-04-16 LAB — LEGIONELLA ANTIGEN, URINE: Legionella Antigen, Urine: NEGATIVE

## 2012-04-16 LAB — EXPECTORATED SPUTUM ASSESSMENT W GRAM STAIN, RFLX TO RESP C

## 2012-04-16 LAB — PRO B NATRIURETIC PEPTIDE: Pro B Natriuretic peptide (BNP): 4011 pg/mL — ABNORMAL HIGH (ref 0–450)

## 2012-04-16 MED ORDER — METOPROLOL TARTRATE 12.5 MG HALF TABLET
12.5000 mg | ORAL_TABLET | Freq: Two times a day (BID) | ORAL | Status: DC
  Administered 2012-04-16 – 2012-04-20 (×9): 12.5 mg via ORAL
  Filled 2012-04-16 (×12): qty 1

## 2012-04-16 NOTE — Plan of Care (Signed)
Problem: Phase I Progression Outcomes Goal: O2 sats > or equal 90% or at baseline During PT session--O2 sats: 90% RA at rest; 78-79% RA during ambulation; 94% 2.5L O2 at rest after ambulation

## 2012-04-16 NOTE — Plan of Care (Signed)
Problem: Phase I Progression Outcomes Goal: Discharge plan established PT recommendations updated: recommend HHPT at discharge.

## 2012-04-16 NOTE — Care Management Note (Signed)
    Page 1 of 2   04/23/2012     1:13:00 PM   CARE MANAGEMENT NOTE 04/23/2012  Patient:  [name], [name]   Account Number:  [id number]  Date Initiated:  04/16/2012  Documentation initiated by:  [facility]  Subjective/Objective Assessment:   ADMITTED W/SOB.     Action/Plan:   FROM HOME,ALONE.HAS HOME 02 2L @ [facility][phone]   Anticipated DC Date:  04/21/2012   Anticipated DC Plan:  HOME W HOME HEALTH SERVICES      DC Planning Services  CM consult      Choice offered to / List presented to:  C-1 Patient   DME arranged  [facility]      DME agency  [facility]     HH arranged  HH-1 RN  HH-2 PT      Status of service:  Completed, signed off Medicare Important Message given?   (If response is "NO", the following Medicare IM given date fields will be blank) Date Medicare IM given:   Date Additional Medicare IM given:    Discharge Disposition:  HOME W HOME HEALTH SERVICES  Per UR Regulation:  Reviewed for med. necessity/level of care/duration of stay  If discussed at Long Length of Stay Meetings, dates discussed:    Comments:  04/21/12  1515p  Spoke w/ pt and her daughter [name] about [facility] and agencies, choice offered and they chose [facility] for PT and RN.  Explained that CM would make referral to [facility] and then they would call the home number to arrange for home visits.  Pt will be staying w/ her daughter - address and phone number listed below.  Notified [facility] of dc need and they will follow up.  Per pt, [facility] brought 02 tank to room - CM verified that tank is available in pt's room for dc home.  Pt stated that [facility] will bring 02 to dtr's home tomorrow - CM called [facility] to verify - awaiting call back from [facility].  TJohnson, RNBSN   04/19/12 Jillian Fehr RN,BSN NCM [phone] QUALIFIED FOR HOME 02,CONTACTED [facility] TEL#[phone]/FAX#[phone] W/CONFIRMATION FOR HOME 02-DEMOGRAPHIC SHEET,H&P,PROGRESS NOTE,02 SATS,02 ORDER.IF D/C IN AM  PLEASE CONTACT [facility] FOR HOME 02 TO BE DELIVERED TO HOSPITAL,THEY WILL NEED D/C SUMMARY.GIVEN THEM FLOOR TEL# AS CONTACT.IF HHC NEEDED WILL NEED ORDERS.[facility] FOLLOWING. 04/18/12 Jillian Laviolette RN,BSN NCM [phone] SPOKE TO [facility] ABOUT PATIENT WANTING LONGER TUBING FOR HOME 02,THEY WILL MAIL TUBING TO DAUGHTER'S HOME [name]-PATIENT WILL STAY W/HER @ D/C.[address], [place] [address] TEL#[phone].PT-HH.[facility] CHOSEN [name]([facility]) AWARE & FOLLOWING.RECOMMEND HHRN/PT.  04/16/12 Jillian Rosensteel RN,BSN NCM [phone]

## 2012-04-16 NOTE — Progress Notes (Signed)
Physical Therapy Treatment Patient Details Name: [name] MRN: [id number] DOB: February 23, 1925 Today's Date: 04/16/2012 Time: 1308-6578 PT Time Calculation (min): 18 min  PT Assessment / Plan / Recommendation Comments on Treatment Session  Mobilizing fairly well. Slightly unsteady with ambulation. O2 sats dropping below 80% on RA. Dyspnea 2/4. Recommended to pt that she return to daughter's home at discharge and use RW for ambulation.     Follow Up Recommendations  Home health PT     Does the patient have the potential to tolerate intense rehabilitation     Barriers to Discharge        Equipment Recommendations  None recommended by PT    Recommendations for Other Services OT consult  Frequency Min 3X/week   Plan Discharge plan needs to be updated    Precautions / Restrictions Precautions Precautions: Fall Restrictions Weight Bearing Restrictions: No   Pertinent Vitals/Pain No c/o    Mobility  Bed Mobility Bed Mobility: Supine to Sit;Sit to Supine Supine to Sit: 6: Modified independent (Device/Increase time);HOB elevated Sit to Supine: 6: Modified independent (Device/Increase time);HOB elevated Transfers Transfers: Sit to Stand;Stand to Sit Sit to Stand: 4: Min assist;From bed Stand to Sit: 4: Min guard;To bed Details for Transfer Assistance: VCs safety, hand placement. Assist to rise, stabilize, control descent.  Ambulation/Gait Ambulation/Gait Assistance: 4: Min assist Ambulation Distance (Feet): 85 Feet Assistive device: 1 person hand held assist Ambulation/Gait Assistance Details: Assist to stabilize. Pt continues to report knees "feeling shaky". O2 sats: 90% RA at rest; 78-79% RA during ambulation; 94% 2.5L O2 at rest after ambulation Gait Pattern: Decreased stride length;Step-through pattern;Decreased step length - left;Decreased step length - right    Exercises     PT Diagnosis:    PT Problem List:   PT Treatment Interventions:     PT Goals Acute Rehab PT  Goals Pt will go Supine/Side to Sit: with modified independence PT Goal: Supine/Side to Sit - Progress: Met Pt will go Sit to Supine/Side: with modified independence PT Goal: Sit to Supine/Side - Progress: Met Pt will go Sit to Stand: with modified independence PT Goal: Sit to Stand - Progress: Progressing toward goal Pt will Ambulate: 51 - 150 feet;with modified independence;with least restrictive assistive device PT Goal: Ambulate - Progress: Progressing toward goal  Visit Information  Last PT Received On: 04/16/12 Assistance Needed: +1    Subjective Data  Subjective: "What can they give me to make me breathe better?" Patient Stated Goal: home   Cognition  Overall Cognitive Status: Appears within functional limits for tasks assessed/performed Arousal/Alertness: Awake/alert Orientation Level: Appears intact for tasks assessed Behavior During Session: [facility] for tasks performed    Balance     End of Session PT - End of Session Equipment Utilized During Treatment: Gait belt Activity Tolerance: Patient tolerated treatment well Patient left: in bed;with call bell/phone within reach;with bed alarm set   GP     Rebeca Alert [place] 04/16/2012, 4:05 PM [phone]

## 2012-04-16 NOTE — Progress Notes (Signed)
  Echocardiogram 2D Echocardiogram has been performed.  [name] 04/16/2012, 3:12 PM

## 2012-04-16 NOTE — Progress Notes (Signed)
[facility] PROGRESS NOTE  Assessment/Plan: Dyspnea (03/01/2012) - Most likely Acute decompensated HF. + JVD lung clear. + Hepatojugular reflex. BNP >4000, CXR pulmonary edema. - increase lasix, strict I &O's low sodium diet. - EKG SR Left atrial enlargement. Incomplete RBBB - TSH WNL, never had Chest pain. - add metoprolol low dose titrate as tolerate it. - allergic reaction pancreatitis to ACE, ? If ARB have cross reactivity will discuss with pharmacy. - Just cough no fever, or leukocytosis, will D'cd antibiotics. - Pleural effusion on right, ? 2/2 to heart failure continue to diurese. No loculation on CT.  HTN (hypertension) (04/16/2012) - stable, monitor, for Low Bp starting Bp meds and diuresing.  Hypothyroidism (04/16/2012) - TSH 2.4   Consultants:  none  Procedures:  ECO: pending  Antibiotics: rocpehin and azithro  HPI/Subjective: Feels better. Want to ambulate  Objective: Filed Vitals:   04/15/12 1425 04/15/12 1522 04/15/12 2102 04/16/12 0551  BP: 153/73 136/77 142/68 127/49  Pulse: 78 78 79 82  Temp:  97.2 F (36.2 C) 97.4 F (36.3 C) 97.5 F (36.4 C)  TempSrc:  Axillary Oral Oral  Resp: 20 24 20 20   Height:  5' 4.5" (1.638 m)    Weight:  73.074 kg (161 lb 1.6 oz)  73.6 kg (162 lb 4.1 oz)  SpO2: 95% 90% 96% 98%    Intake/Output Summary (Last 24 hours) at 04/16/12 1024 Last data filed at 04/16/12 0800  Gross per 24 hour  Intake    784 ml  Output    550 ml  Net    234 ml   Filed Weights   04/15/12 1522 04/16/12 0551  Weight: 73.074 kg (161 lb 1.6 oz) 73.6 kg (162 lb 4.1 oz)    Exam:  General: Alert, awake, oriented x3, in no acute distress.  HEENT: No bruits, no goiter. + JVD + HJR Heart: Regular rate and rhythm, without murmurs, rubs, gallops.  Lungs: Good air movement, clear to aurcultation. Abdomen: Soft, nontender, nondistended, positive bowel sounds.  Neuro: Grossly intact, nonfocal.   Data Reviewed: Basic Metabolic  Panel:  Lab 04/16/12 0518 04/15/12 1133 04/15/12 1113  NA 140 -- 138  K 3.9 -- 3.7  CL 99 -- 97  CO2 36* -- 31  GLUCOSE 103* -- 183*  BUN 23 -- 25*  CREATININE 1.05 -- 0.93  CALCIUM 9.0 -- 9.3  MG -- 1.8 --  PHOS -- 3.2 --   Liver Function Tests:  Lab 04/15/12 1113  AST 20  ALT 11  ALKPHOS 75  BILITOT 0.3  PROT 7.0  ALBUMIN 3.1*   No results found for this basename: LIPASE:5,AMYLASE:5 in the last 168 hours No results found for this basename: AMMONIA:5 in the last 168 hours CBC:  Lab 04/16/12 0518 04/15/12 1113  WBC 6.7 7.7  NEUTROABS -- --  HGB 10.7* 11.7*  HCT 32.7* 35.4*  MCV 94.2 92.7  PLT 210 254   Cardiac Enzymes:  Lab 04/15/12 1113  CKTOTAL --  CKMB --  CKMBINDEX --  TROPONINI <0.30   BNP (last 3 results)  Basename 04/16/12 0518 04/15/12 1113  PROBNP 4011.0* 4040.0*   CBG: No results found for this basename: GLUCAP:5 in the last 168 hours  Recent Results (from the past 240 hour(s))  CULTURE, BLOOD (ROUTINE X 2)     Status: Normal (Preliminary result)   Collection Time   04/15/12  1:30 PM      Component Value Range Status Comment   Specimen Description BLOOD RIGHT ARM  Final    Special Requests BOTTLES DRAWN AEROBIC AND ANAEROBIC 5CC   Final    Culture  Setup Time 04/15/2012 23:09   Final    Culture     Final    Value:        BLOOD CULTURE RECEIVED NO GROWTH TO DATE CULTURE WILL BE HELD FOR 5 DAYS BEFORE ISSUING A FINAL NEGATIVE REPORT   Report Status PENDING   Incomplete   CULTURE, BLOOD (ROUTINE X 2)     Status: Normal (Preliminary result)   Collection Time   04/15/12  1:35 PM      Component Value Range Status Comment   Specimen Description BLOOD RIGHT ARM   Final    Special Requests BOTTLES DRAWN AEROBIC ONLY 4CC   Final    Culture  Setup Time 04/15/2012 23:08   Final    Culture     Final    Value:        BLOOD CULTURE RECEIVED NO GROWTH TO DATE CULTURE WILL BE HELD FOR 5 DAYS BEFORE ISSUING A FINAL NEGATIVE REPORT   Report Status PENDING    Incomplete   CULTURE, EXPECTORATED SPUTUM-ASSESSMENT     Status: Normal   Collection Time   04/16/12  8:31 AM      Component Value Range Status Comment   Specimen Description SPUTUM   Final    Special Requests Normal   Final    Sputum evaluation     Final    Value: THIS SPECIMEN IS ACCEPTABLE. RESPIRATORY CULTURE REPORT TO FOLLOW.   Report Status 04/16/2012 FINAL   Final      Studies: Dg Chest 2 View  04/15/2012  *RADIOLOGY REPORT*  Clinical Data: Shortness of breath, weakness.  CHEST - 2 VIEW  Comparison: 03/01/2012  Findings: Heart is enlarged.  Increasing right lower lobe airspace disease and right effusion.  Minimal left base scarring or atelectasis, similar to prior study.  Mild vascular congestion. Diffuse interstitial prominence has increased and may represent interstitial edema.  IMPRESSION: Cardiomegaly, question interstitial edema.  Worsening right pleural effusion, now moderate.  Increasing right lower lobe atelectasis or consolidation.   Original Report Authenticated By: Charlett Nose, M.D.    Ct Chest Wo Contrast  04/15/2012  *RADIOLOGY REPORT*  Clinical Data: Cough and shortness of breath.  Right pleural effusion.  CHF.  CT CHEST WITHOUT CONTRAST  Technique:  Multidetector CT imaging of the chest was performed following the standard protocol without IV contrast.  Comparison: Chest CT dated 11/13/2008 and chest radiographs obtained earlier today.  Findings: Moderate to large sized right pleural effusion.  Small left pleural effusion.  Mild patchy interstitial prominence in both lungs.  Compressive atelectasis in the right lung.  Minimal left basilar atelectasis.  No lung masses or enlarged lymph nodes. Diffuse low density of the blood relative to the arterial walls. Atheromatous arterial calcifications, including the coronary arteries.  Scattered colonic diverticula in the upper abdomen. Thoracic and upper lumbar spine degenerative changes, including changes of DISH.  IMPRESSION:  1.   Moderate to large-sized right pleural effusion. 2.  Small left pleural effusion. 3.  Compressive atelectasis in the right lung. 4.  Patchy interstitial prominence in both lungs.  Differential considerations include interstitial pulmonary edema and interstitial pneumonitis. 5.  Colonic diverticulosis. 6.  Atheromatous arterial calcifications, including the coronary arteries. 7.  Evidence of anemia.   Original Report Authenticated By: Beckie Salts, M.D.     Scheduled Meds:    . [COMPLETED] albuterol  5 mg  Nebulization Once  . antiseptic oral rinse  15 mL Mouth Rinse BID  . beta carotene w/minerals  1 tablet Oral Daily  . calcium carbonate  1 tablet Oral Daily  . cholecalciferol  1,000 Units Oral Daily  . dipyridamole-aspirin  1 capsule Oral BID  . ferrous sulfate  325 mg Oral Daily  . folic acid  1 mg Oral Daily  . [COMPLETED] furosemide  20 mg Intravenous Once  . [COMPLETED] furosemide  20 mg Intravenous Once  . furosemide  20 mg Intravenous Daily  . [COMPLETED] ipratropium  0.5 mg Nebulization Once  . levothyroxine  100 mcg Oral Q0600  . metoprolol tartrate  25 mg Oral Daily  . multivitamin with minerals  1 tablet Oral Daily  . pantoprazole  40 mg Oral Daily  . simvastatin  40 mg Oral QPM  . sodium chloride  3 mL Intravenous Q12H  . traMADol  50 mg Oral TID  . [DISCONTINUED] azithromycin  500 mg Intravenous Q24H  . [DISCONTINUED] azithromycin  500 mg Oral Q24H  . [DISCONTINUED] cefTRIAXone (ROCEPHIN)  IV  1 g Intravenous Q24H  . [DISCONTINUED] cefTRIAXone (ROCEPHIN)  IV  1 g Intravenous Q24H  . [DISCONTINUED] sodium chloride  3 mL Intravenous Q12H   Continuous Infusions:    . [DISCONTINUED] sodium chloride 20 mL/hr at 04/15/12 1134     FELIZ Rosine Beat  [facility] Pager [phone]. If 8PM-8AM, please contact night-coverage at [email or web address], password [facility] 04/16/2012, 10:24 AM  LOS: 1 day

## 2012-04-17 ENCOUNTER — Inpatient Hospital Stay (HOSPITAL_COMMUNITY): Payer: Medicare Other

## 2012-04-17 LAB — BASIC METABOLIC PANEL
BUN: 37 mg/dL — ABNORMAL HIGH (ref 6–23)
Calcium: 9.1 mg/dL (ref 8.4–10.5)
Chloride: 98 mEq/L (ref 96–112)
Creatinine, Ser: 1.35 mg/dL — ABNORMAL HIGH (ref 0.50–1.10)
GFR calc Af Amer: 40 mL/min — ABNORMAL LOW (ref >90)

## 2012-04-17 NOTE — Progress Notes (Signed)
[facility] PROGRESS NOTE  Assessment/Plan: Dyspnea (03/01/2012) - Most likely Acute decompensated HF. + JVD lung clear. + Hepatojugular reflex. BNP >4000, CXR pulmonary edema. She is only diuresed approximately 1.2 L and is still notably hypoxic. We'll recheck repeat chest x-ray to see if thoracentesis is warranted - Continue lasix, strict I &O's low sodium diet. - EKG SR Left atrial enlargement. Incomplete RBBB - TSH WNL, never had Chest pain. Tolerating beta blocker - allergic reaction pancreatitis to ACE, ? If ARB have cross reactivity will discuss with pharmacy. - Just cough no fever, or leukocytosis, will D'cd antibiotics. - Pleural effusion on right, ? 2/2 to heart failure continue to diurese. No loculation on CT.  HTN (hypertension) (04/16/2012) - stable, monitor, for Low Bp starting Bp meds and diuresing.  Hypothyroidism (04/16/2012) - TSH 2.4   Consultants:  none  Procedures:  Echocardiogram done 12/3: Normal EF of 55%. Moderate left ventricular hypertrophy, moderate mitral regurg  Plan discussed with patient and daughter at bedside today  Antibiotics: IV Rocephin and Zithromax given on admission. Not continued.  HPI/Subjective: Patient feeling better overall. Gets very short of breath with ambulation (even while on oxygen)  Objective: Filed Vitals:   04/16/12 1400 04/16/12 2012 04/17/12 0627 04/17/12 1400  BP: 114/51 128/41 135/55 115/41  Pulse: 74 79 85 67  Temp: 97.7 F (36.5 C) 97.9 F (36.6 C) 98.2 F (36.8 C) 97.3 F (36.3 C)  TempSrc: Oral Oral Oral Oral  Resp: 20 24 20 20   Height:      Weight:   73.4 kg (161 lb 13.1 oz)   SpO2: 98% 96% 97% 97%    Intake/Output Summary (Last 24 hours) at 04/17/12 1544 Last data filed at 04/17/12 1140  Gross per 24 hour  Intake    480 ml  Output   1175 ml  Net   -695 ml   Filed Weights   04/15/12 1522 04/16/12 0551 04/17/12 0627  Weight: 73.074 kg (161 lb 1.6 oz) 73.6 kg (162 lb 4.1 oz) 73.4 kg (161  lb 13.1 oz)    Exam:  General: Alert, awake, oriented x3, in no acute distress.  HEENT: No bruits, no goiter. + JVD + HJR Heart: Regular rate and rhythm, without murmurs, rubs, gallops.  Lungs: Good air movement, good auscultation bilaterally, no crackles Abdomen: Soft, nontender, nondistended, positive bowel sounds.  Neuro: Grossly intact, nonfocal.   Data Reviewed: Basic Metabolic Panel:  Lab 04/17/12 1478 04/16/12 0518 04/15/12 1133 04/15/12 1113  NA 140 140 -- 138  K 4.1 3.9 -- 3.7  CL 98 99 -- 97  CO2 34* 36* -- 31  GLUCOSE 108* 103* -- 183*  BUN 37* 23 -- 25*  CREATININE 1.35* 1.05 -- 0.93  CALCIUM 9.1 9.0 -- 9.3  MG -- -- 1.8 --  PHOS -- -- 3.2 --   Liver Function Tests:  Lab 04/15/12 1113  AST 20  ALT 11  ALKPHOS 75  BILITOT 0.3  PROT 7.0  ALBUMIN 3.1*   CBC:  Lab 04/16/12 0518 04/15/12 1113  WBC 6.7 7.7  NEUTROABS -- --  HGB 10.7* 11.7*  HCT 32.7* 35.4*  MCV 94.2 92.7  PLT 210 254   Cardiac Enzymes:  Lab 04/15/12 1113  CKTOTAL --  CKMB --  CKMBINDEX --  TROPONINI <0.30   BNP (last 3 results)  Basename 04/16/12 0518 04/15/12 1113  PROBNP 4011.0* 4040.0*        Studies: Ct Chest Wo Contrast  04/15/2012   IMPRESSION:  1.  Moderate to large-sized right pleural effusion. 2.  Small left pleural effusion. 3.  Compressive atelectasis in the right lung. 4.  Patchy interstitial prominence in both lungs.  Differential considerations include interstitial pulmonary edema and interstitial pneumonitis. 5.  Colonic diverticulosis. 6.  Atheromatous arterial calcifications, including the coronary arteries. 7.  Evidence of anemia.   Original Report Authenticated By: Beckie Salts, M.D.     Scheduled Meds:    . antiseptic oral rinse  15 mL Mouth Rinse BID  . beta carotene w/minerals  1 tablet Oral Daily  . calcium carbonate  1 tablet Oral Daily  . cholecalciferol  1,000 Units Oral Daily  . dipyridamole-aspirin  1 capsule Oral BID  . ferrous sulfate   325 mg Oral Daily  . folic acid  1 mg Oral Daily  . levothyroxine  100 mcg Oral Q0600  . metoprolol tartrate  12.5 mg Oral BID  . multivitamin with minerals  1 tablet Oral Daily  . pantoprazole  40 mg Oral Daily  . simvastatin  40 mg Oral QPM  . sodium chloride  3 mL Intravenous Q12H  . traMADol  50 mg Oral TID  . [DISCONTINUED] furosemide  20 mg Intravenous Daily   Continuous Infusions:     Hollice Espy  [facility] Pager [phone]. If 8PM-8AM, please contact night-coverage at [email or web address], password [facility] 04/17/2012, 3:44 PM  LOS: 2 days

## 2012-04-17 NOTE — Plan of Care (Signed)
Problem: Phase I Progression Outcomes Goal: O2 sats > or equal 90% or at baseline During PT session--O2 sats: 92% 2.5L at rest; 82% 2L during ambulation; 93% 2.5L at rest after ambulation

## 2012-04-17 NOTE — Progress Notes (Signed)
Physical Therapy Treatment Patient Details Name: EVALISSE PRAJAPATI MRN: [id number] DOB: April 20, 1925 Today's Date: 04/17/2012 Time: 4098-1191 PT Time Calculation (min): 23 min  PT Assessment / Plan / Recommendation Comments on Treatment Session  Continuing to mobilize well. O2 sats still dropping with activity though. Continue to recommend HHPT to maximize independence and safety with mobility and to improve activity tolerance    Follow Up Recommendations  Home health PT     Does the patient have the potential to tolerate intense rehabilitation     Barriers to Discharge        Equipment Recommendations  None recommended by PT    Recommendations for Other Services    Frequency Min 3X/week   Plan Discharge plan remains appropriate    Precautions / Restrictions Precautions Precautions: Fall Restrictions Weight Bearing Restrictions: No   Pertinent Vitals/Pain O2 sats: 92% 2.5L at rest; 82% 2L during ambulation; 93% 2.5L at rest after ambulation    Mobility  Bed Mobility Bed Mobility: Supine to Sit Supine to Sit: 6: Modified independent (Device/Increase time);HOB elevated Transfers Transfers: Sit to Stand;Stand to Sit Sit to Stand: 4: Min guard;From bed Stand to Sit: 4: Min guard;With armrests;To chair/3-in-1 Ambulation/Gait Ambulation/Gait Assistance: 4: Min guard Ambulation Distance (Feet): 140 Feet Assistive device: Straight cane Ambulation/Gait Assistance Details: O2 sats dropped to 82% on 2 L O2 during ambulation. Dyspnea 2/4. 1 standing rest break. No LOB.  Gait Pattern: Decreased stride length;Step-through pattern    Exercises General Exercises - Lower Extremity Ankle Circles/Pumps: AROM;Both;10 reps;Seated Long Arc Quad: Both;10 reps;Seated Hip Flexion/Marching: AROM;Both;10 reps;Seated   PT Diagnosis:    PT Problem List:   PT Treatment Interventions:     PT Goals Acute Rehab PT Goals Pt will go Supine/Side to Sit: with modified independence PT Goal: Supine/Side  to Sit - Progress: Met Pt will go Sit to Stand: with modified independence PT Goal: Sit to Stand - Progress: Progressing toward goal Pt will Ambulate: 51 - 150 feet;with modified independence;with least restrictive assistive device PT Goal: Ambulate - Progress: Progressing toward goal  Visit Information  Last PT Received On: 04/17/12 Assistance Needed: +1    Subjective Data  Subjective: "I just want to know why my oxygen is dropping" Patient Stated Goal: home   Cognition  Overall Cognitive Status: Appears within functional limits for tasks assessed/performed Arousal/Alertness: Awake/alert Orientation Level: Appears intact for tasks assessed Behavior During Session: [facility] for tasks performed    Balance     End of Session PT - End of Session Equipment Utilized During Treatment: Gait belt;Oxygen Activity Tolerance: Patient tolerated treatment well Patient left: in chair;with call bell/phone within reach;with family/visitor present   GP     Rebeca Alert [facility] 04/17/2012, 11:23 AM [phone]

## 2012-04-18 ENCOUNTER — Inpatient Hospital Stay (HOSPITAL_COMMUNITY): Payer: Medicare Other

## 2012-04-18 DIAGNOSIS — I509 Heart failure, unspecified: Secondary | ICD-10-CM

## 2012-04-18 DIAGNOSIS — J90 Pleural effusion, not elsewhere classified: Secondary | ICD-10-CM

## 2012-04-18 DIAGNOSIS — E039 Hypothyroidism, unspecified: Secondary | ICD-10-CM

## 2012-04-18 DIAGNOSIS — R0989 Other specified symptoms and signs involving the circulatory and respiratory systems: Secondary | ICD-10-CM

## 2012-04-18 LAB — BODY FLUID CELL COUNT WITH DIFFERENTIAL
Neutrophil Count, Fluid: 1 % (ref 0–25)
Total Nucleated Cell Count, Fluid: 575 cu mm (ref 0–1000)

## 2012-04-18 LAB — CULTURE, RESPIRATORY W GRAM STAIN: Culture: NORMAL

## 2012-04-18 LAB — LACTATE DEHYDROGENASE: LDH: 172 U/L (ref 94–250)

## 2012-04-18 NOTE — Progress Notes (Signed)
Physical Therapy Treatment Patient Details Name: STOREY STANGELAND MRN: [id number] DOB: 11-28-1924 Today's Date: 04/18/2012 Time: 4782-9562 PT Time Calculation (min): 17 min  PT Assessment / Plan / Recommendation Comments on Treatment Session  Pt did well with RW today for added security.  O2 sats remained > 90% on 2Liters. Continue to recommend HHPT for strenghthening to maximize independence, safety and progress pt to ambulation without assistive device or straight cane safety as she was prior to admission    Follow Up Recommendations  Home health PT     Does the patient have the potential to tolerate intense rehabilitation     Barriers to Discharge        Equipment Recommendations  None recommended by PT    Recommendations for Other Services    Frequency Min 3X/week   Plan Discharge plan remains appropriate    Precautions / Restrictions Restrictions Weight Bearing Restrictions: No   Pertinent Vitals/Pain Pt with some pain at right back just below scapula    Mobility  Bed Mobility Bed Mobility: Supine to Sit Supine to Sit: 6: Modified independent (Device/Increase time);HOB elevated Transfers Transfers: Sit to Stand;Stand to Sit Sit to Stand: From bed;5: Supervision;With upper extremity assist Stand to Sit: 5: Supervision;With upper extremity assist Ambulation/Gait Ambulation/Gait Assistance: 5: Supervision Ambulation Distance (Feet): 200 Feet (100x2) Ambulation/Gait Assistance Details: cues given for  trunk extension in gait.  Gait Pattern: Step-through pattern Gait velocity: decreased General Gait Details: Pt with no dyspnea today.  O2 sats remained greater than 90% throughout on 2L O2 Stairs: No Wheelchair Mobility Wheelchair Mobility: No    Exercises Other Exercises Other Exercises: repeated sit to stand x 5 reps with emphasis on trunk extension and slow descent in sitting for eccentric quad strengthening   PT Diagnosis:    PT Problem List:   PT Treatment  Interventions:     PT Goals Acute Rehab PT Goals Pt will go Supine/Side to Sit: with modified independence Pt will go Sit to Stand: with modified independence PT Goal: Sit to Stand - Progress: Progressing toward goal Pt will Ambulate: 51 - 150 feet;with modified independence;with least restrictive assistive device PT Goal: Ambulate - Progress: Progressing toward goal  Visit Information  Last PT Received On: 04/18/12 Assistance Needed: +1    Subjective Data  Subjective: " I have some pain at my right shoulder blade" Patient Stated Goal: to go to daughter's   Cognition  Overall Cognitive Status: Appears within functional limits for tasks assessed/performed Arousal/Alertness: Awake/alert Orientation Level: Appears intact for tasks assessed Behavior During Session: [facility] for tasks performed    Balance     End of Session PT - End of Session Equipment Utilized During Treatment: Gait belt;Oxygen Activity Tolerance: Patient tolerated treatment well Patient left: with call bell/phone within reach;in bed;with nursing in room Nurse Communication: Mobility status   GP     Bayard Hugger. [place], [place] [phone] 04/18/2012, 3:11 PM

## 2012-04-18 NOTE — Progress Notes (Signed)
[facility] PROGRESS NOTE  Assessment/Plan:  Pleural effusion: Status post thoracentesis by pulmonary. Appreciate assistance. Fluid sent for studies. For liters taken off. Patient symptomatically better.  Dyspnea (03/01/2012) - secondary to large pleural effusion. See above .  Some component of mild heart failure BNP >4000,  She is only diuresed approximately 1.2 L and is still notably hypoxic.  -strict I &O's low sodium diet. - EKG SR Left atrial enlargement. Incomplete RBBB - TSH WNL, never had Chest pain. Tolerating beta blocker - allergic reaction pancreatitis to ACE, ? If ARB have cross reactivity will discuss with pharmacy. - Just cough no fever, or leukocytosis, will D'cd antibiotics. - Pleural effusion on right, ? 2/2 to heart failure continue to diurese. No loculation on CT.  HTN (hypertension) (04/16/2012) - stable, monitor, for Low Bp starting Bp meds and diuresing.  Hypothyroidism (04/16/2012) - TSH 2.4   Consultants:  none  Procedures:  Echocardiogram done 12/3: Normal EF of 55%. Moderate left ventricular hypertrophy, moderate mitral regurg  Plan discussed with patient, prior to thoracentesis  Antibiotics: IV Rocephin and Zithromax given on admission. Not continued.  HPI/Subjective: Patient seen before tap.  Some shortness of breath when off of oxygen.  Objective: Filed Vitals:   04/18/12 0605 04/18/12 1054 04/18/12 1210 04/18/12 1354  BP: 113/46 140/55 124/42 98/43  Pulse: 85 80 59 80  Temp: 97.4 F (36.3 C)  98.1 F (36.7 C) 97.7 F (36.5 C)  TempSrc: Oral  Oral Oral  Resp: 20 20 18 20   Height:      Weight: 75.479 kg (166 lb 6.4 oz)     SpO2: 95% 97% 97% 95%    Intake/Output Summary (Last 24 hours) at 04/18/12 1437 Last data filed at 04/18/12 1319  Gross per 24 hour  Intake    720 ml  Output    425 ml  Net    295 ml   Filed Weights   04/16/12 0551 04/17/12 0627 04/18/12 0605  Weight: 73.6 kg (162 lb 4.1 oz) 73.4 kg (161 lb 13.1 oz)  75.479 kg (166 lb 6.4 oz)    Exam:  General: Alert, awake, oriented x3, in no acute distress.  HEENT: No bruits, no goiter. + JVD + HJR Heart: Regular rate and rhythm, without murmurs, rubs, gallops.  Lungs: Good air movement, good auscultation bilaterally, no crackles, decreased breath sounds on the right side halfway down Abdomen: Soft, nontender, nondistended, positive bowel sounds.  Neuro: Grossly intact, nonfocal.   Data Reviewed: Basic Metabolic Panel:  Lab 04/17/12 9604 04/16/12 0518 04/15/12 1133 04/15/12 1113  NA 140 140 -- 138  K 4.1 3.9 -- 3.7  CL 98 99 -- 97  CO2 34* 36* -- 31  GLUCOSE 108* 103* -- 183*  BUN 37* 23 -- 25*  CREATININE 1.35* 1.05 -- 0.93  CALCIUM 9.1 9.0 -- 9.3  MG -- -- 1.8 --  PHOS -- -- 3.2 --   Liver Function Tests:  Lab 04/15/12 1113  AST 20  ALT 11  ALKPHOS 75  BILITOT 0.3  PROT 7.0  ALBUMIN 3.1*   CBC:  Lab 04/16/12 0518 04/15/12 1113  WBC 6.7 7.7  NEUTROABS -- --  HGB 10.7* 11.7*  HCT 32.7* 35.4*  MCV 94.2 92.7  PLT 210 254   Cardiac Enzymes:  Lab 04/15/12 1113  CKTOTAL --  CKMB --  CKMBINDEX --  TROPONINI <0.30   BNP (last 3 results)  Basename 04/16/12 0518 04/15/12 1113  PROBNP 4011.0* 4040.0*  Studies: Ct Chest Wo Contrast  04/15/2012   IMPRESSION:  1.  Moderate to large-sized right pleural effusion. 2.  Small left pleural effusion. 3.  Compressive atelectasis in the right lung. 4.  Patchy interstitial prominence in both lungs.  Differential considerations include interstitial pulmonary edema and interstitial pneumonitis. 5.  Colonic diverticulosis. 6.  Atheromatous arterial calcifications, including the coronary arteries. 7.  Evidence of anemia.   Original Report Authenticated By: Beckie Salts, M.D.     Scheduled Meds:    . antiseptic oral rinse  15 mL Mouth Rinse BID  . beta carotene w/minerals  1 tablet Oral Daily  . calcium carbonate  1 tablet Oral Daily  . cholecalciferol  1,000 Units Oral  Daily  . dipyridamole-aspirin  1 capsule Oral BID  . ferrous sulfate  325 mg Oral Daily  . folic acid  1 mg Oral Daily  . levothyroxine  100 mcg Oral Q0600  . metoprolol tartrate  12.5 mg Oral BID  . multivitamin with minerals  1 tablet Oral Daily  . pantoprazole  40 mg Oral Daily  . simvastatin  40 mg Oral QPM  . sodium chloride  3 mL Intravenous Q12H  . traMADol  50 mg Oral TID   Continuous Infusions:   Time spent: 15 minutes  Hollice Espy  [facility] Pager [phone]. If 8PM-8AM, please contact night-coverage at [email or web address], password [facility] 04/18/2012, 2:37 PM  LOS: 3 days

## 2012-04-18 NOTE — Procedures (Signed)
Thoracentesis Procedure Note  Pre-operative Diagnosis: right pleural effusion   Post-operative Diagnosis: same  Indications: evaluation symptom relief   Procedure Details  Consent: Informed consent was obtained. Risks of the procedure were discussed including: infection, bleeding, pain, pneumothorax.  Under sterile conditions the patient was positioned. Betadine solution and sterile drapes were utilized.  1% buffered lidocaine was used to anesthetize the pleural space which was identified by real time [place]. Fluid was obtained without any difficulties and minimal blood loss.  A dressing was applied to the wound and wound care instructions were provided.   Findings 1400 ml of clear pleural fluid was obtained. A sample was sent to Pathology for cytogenetics, flow, and cell counts, as well as for infection analysis.  Complications:  None; patient tolerated the procedure well.          Condition: stable  Plan A follow up chest x-ray was ordered. Bed Rest for 0 hours. Tylenol 650 mg. for pain.  Attending Attestation: I was present and scrubbed for the key portions of the procedure.     Performed by ACNP Alonza Smoker, MD ; PCCM service Mobile [phone].  After 5:30 PM or weekends, call [phone]

## 2012-04-19 LAB — CBC
Hemoglobin: 10.5 g/dL — ABNORMAL LOW (ref 12.0–15.0)
MCH: 30.3 pg (ref 26.0–34.0)
RBC: 3.47 MIL/uL — ABNORMAL LOW (ref 3.87–5.11)

## 2012-04-19 LAB — BASIC METABOLIC PANEL
CO2: 33 mEq/L — ABNORMAL HIGH (ref 19–32)
GFR calc non Af Amer: 42 mL/min — ABNORMAL LOW (ref >90)
Glucose, Bld: 105 mg/dL — ABNORMAL HIGH (ref 70–99)
Potassium: 4.6 mEq/L (ref 3.5–5.1)
Sodium: 138 mEq/L (ref 135–145)

## 2012-04-19 LAB — CHOLESTEROL, BODY FLUID

## 2012-04-19 LAB — PH, BODY FLUID

## 2012-04-19 MED ORDER — FUROSEMIDE 20 MG PO TABS
20.0000 mg | ORAL_TABLET | ORAL | Status: DC
  Administered 2012-04-19: 20 mg via ORAL
  Filled 2012-04-19 (×2): qty 1

## 2012-04-19 NOTE — Progress Notes (Signed)
Physical Therapy Treatment Patient Details Name: [name] MRN: [id number] DOB: 10-12-1924 Today's Date: 04/19/2012 Time: 4098-1191 PT Time Calculation (min): 19 min  PT Assessment / Plan / Recommendation Comments on Treatment Session  Pt doing well with mobility. She walked 260' with RW and 2L O2, sats dropped to 84% while walking, increased to 93% when O2 increased to 3L. No SOB. HHPT recommended for strengthening.    Follow Up Recommendations  Home health PT     Does the patient have the potential to tolerate intense rehabilitation     Barriers to Discharge        Equipment Recommendations  None recommended by PT    Recommendations for Other Services    Frequency Min 3X/week   Plan Discharge plan remains appropriate    Precautions / Restrictions Precautions Precaution Comments: monitor O@ sats Restrictions Weight Bearing Restrictions: No   Pertinent Vitals/Pain *Pt denied pain SaO2 84% on 2L O2 while walking; increased to 93% on 3L 94% at rest on 2L, no SOB**    Mobility  Bed Mobility Bed Mobility: Supine to Sit Supine to Sit: 6: Modified independent (Device/Increase time);HOB elevated Transfers Transfers: Sit to Stand;Stand to Sit Sit to Stand: From bed;5: Supervision;With upper extremity assist Stand to Sit: 5: Supervision;With upper extremity assist;To chair/3-in-1 Details for Transfer Assistance: VCs hand placement Ambulation/Gait Ambulation/Gait Assistance: 5: Supervision Ambulation Distance (Feet): 260 Feet Assistive device: Rolling walker Gait Pattern: Step-through pattern General Gait Details: Pt with no dyspnea today.  O2 sats dropped to 84% on 2L O2 with walking, increased to 93% on 3 L O2.  No SOB with walking. Stairs: No Wheelchair Mobility Wheelchair Mobility: No    Exercises     PT Diagnosis:    PT Problem List:   PT Treatment Interventions:     PT Goals Acute Rehab PT Goals Pt will go Supine/Side to Sit: with modified independence PT  Goal: Supine/Side to Sit - Progress: Progressing toward goal Pt will go Sit to Stand: with modified independence PT Goal: Sit to Stand - Progress: Progressing toward goal Pt will Ambulate: 51 - 150 feet;with modified independence;with least restrictive assistive device PT Goal: Ambulate - Progress: Progressing toward goal  Visit Information  Last PT Received On: 04/19/12 Assistance Needed: +1    Subjective Data  Subjective: They took 1400 ml of fluid off my lungs.   Patient Stated Goal: return to PLOF   Cognition  Overall Cognitive Status: Appears within functional limits for tasks assessed/performed Arousal/Alertness: Awake/alert Orientation Level: Appears intact for tasks assessed Behavior During Session: [facility] for tasks performed    Balance     End of Session PT - End of Session Equipment Utilized During Treatment: Gait belt;Oxygen Activity Tolerance: Patient tolerated treatment well Patient left: with call bell/phone within reach;in chair Nurse Communication: Mobility status;Other (comment) (drop in O2 sats with walking)   GP     Tamala Ser 04/19/2012, 9:48 AM [phone]

## 2012-04-19 NOTE — Progress Notes (Signed)
[facility] PROGRESS NOTE  Assessment/Plan:  Pleural effusion: Status post thoracentesis by pulmonary on 12/5. Appreciate assistance. Fluid sent for studies. For liters taken off. Patient symptomatically better.  Dyspnea (03/01/2012) - secondary to large pleural effusion in aprt. See above .  Some component of mild heart failure BNP >4000,  She is only diuresed approximately 1.2 L and is still notably hypoxic.  However today, she still requiring 2 L of oxygen and was activity 3 L. Review of her CT on admission note signs of perhaps interstitial pneumonitis versus chronic restrictive disease. Have asked pulmonary to evaluate for further recommendations..  -strict I &O's low sodium diet. - EKG SR Left atrial enlargement. Incomplete RBBB - TSH WNL, never had Chest pain. Tolerating beta blocker - allergic reaction pancreatitis to ACE, ? If ARB have cross reactivity will discuss with pharmacy. - Just cough no fever, or leukocytosis, will D'cd antibiotics. - Pleural effusion on right, ? 2/2 to heart failure continue to diurese. No loculation on CT.  HTN (hypertension) (04/16/2012) - stable, monitor, for Low Bp starting Bp meds and diuresing.  Hypothyroidism (04/16/2012) - TSH 2.4   Consultants:  none  Procedures:  Echocardiogram done 12/3: Normal EF of 55%. Moderate left ventricular hypertrophy, moderate mitral regurg  Plan discussed with patient, prior to thoracentesis  Antibiotics: IV Rocephin and Zithromax given on admission. Not continued.  HPI/Subjective:   Objective: Filed Vitals:   04/19/12 0617 04/19/12 0922 04/19/12 0943 04/19/12 1314  BP: 126/38   134/42  Pulse: 78   65  Temp: 97.8 F (36.6 C)   97.8 F (36.6 C)  TempSrc: Oral   Oral  Resp: 20   18  Height:      Weight: 73.5 kg (162 lb 0.6 oz)     SpO2: 96% 84% 94% 99%    Intake/Output Summary (Last 24 hours) at 04/19/12 1518 Last data filed at 04/19/12 1317  Gross per 24 hour  Intake    363 ml   Output    600 ml  Net   -237 ml   Filed Weights   04/17/12 0627 04/18/12 0605 04/19/12 0617  Weight: 73.4 kg (161 lb 13.1 oz) 75.479 kg (166 lb 6.4 oz) 73.5 kg (162 lb 0.6 oz)    Exam:  General: Alert, awake, oriented x3, in no acute distress.  HEENT: No bruits, no goiter. + JVD + HJR Heart: Regular rate and rhythm, without murmurs, rubs, gallops.  Lungs: Good air movement,  both sides no clear to auscultation bilaterally  Abdomen: Soft, nontender, nondistended, positive bowel sounds.  Neuro: Grossly intact, nonfocal.   Data Reviewed: Basic Metabolic Panel:  Lab 04/19/12 4259 04/17/12 0505 04/16/12 0518 04/15/12 1133 04/15/12 1113  NA 138 140 140 -- 138  K 4.6 4.1 3.9 -- 3.7  CL 99 98 99 -- 97  CO2 33* 34* 36* -- 31  GLUCOSE 105* 108* 103* -- 183*  BUN 38* 37* 23 -- 25*  CREATININE 1.16* 1.35* 1.05 -- 0.93  CALCIUM 9.0 9.1 9.0 -- 9.3  MG -- -- -- 1.8 --  PHOS -- -- -- 3.2 --   Liver Function Tests:  Lab 04/18/12 1510 04/15/12 1113  AST -- 20  ALT -- 11  ALKPHOS -- 75  BILITOT -- 0.3  PROT 6.8 7.0  ALBUMIN -- 3.1*   CBC:  Lab 04/19/12 0458 04/16/12 0518 04/15/12 1113  WBC 7.3 6.7 7.7  NEUTROABS -- -- --  HGB 10.5* 10.7* 11.7*  HCT 33.5* 32.7* 35.4*  MCV 96.5 94.2 92.7  PLT 239 210 254   Cardiac Enzymes:  Lab 04/15/12 1113  CKTOTAL --  CKMB --  CKMBINDEX --  TROPONINI <0.30   BNP (last 3 results)  Basename 04/16/12 0518 04/15/12 1113  PROBNP 4011.0* 4040.0*        Studies: Ct Chest Wo Contrast  04/15/2012   IMPRESSION:  1.  Moderate to large-sized right pleural effusion. 2.  Small left pleural effusion. 3.  Compressive atelectasis in the right lung. 4.  Patchy interstitial prominence in both lungs.  Differential considerations include interstitial pulmonary edema and interstitial pneumonitis. 5.  Colonic diverticulosis. 6.  Atheromatous arterial calcifications, including the coronary arteries. 7.  Evidence of anemia.   Original Report  Authenticated By: Beckie Salts, M.D.     Scheduled Meds:    . antiseptic oral rinse  15 mL Mouth Rinse BID  . beta carotene w/minerals  1 tablet Oral Daily  . calcium carbonate  1 tablet Oral Daily  . cholecalciferol  1,000 Units Oral Daily  . dipyridamole-aspirin  1 capsule Oral BID  . ferrous sulfate  325 mg Oral Daily  . folic acid  1 mg Oral Daily  . furosemide  20 mg Oral QODAY  . levothyroxine  100 mcg Oral Q0600  . metoprolol tartrate  12.5 mg Oral BID  . multivitamin with minerals  1 tablet Oral Daily  . pantoprazole  40 mg Oral Daily  . simvastatin  40 mg Oral QPM  . traMADol  50 mg Oral TID  . [DISCONTINUED] sodium chloride  3 mL Intravenous Q12H   Continuous Infusions:   Time spent: 25  minutes  Hollice Espy  [facility] Pager [phone]. If 8PM-8AM, please contact night-coverage at [email or web address], password [facility] 04/19/2012, 3:18 PM  LOS: 4 days

## 2012-04-19 NOTE — Progress Notes (Signed)
SATURATION QUALIFICATIONS: (This note is used to comply with regulatory documentation for home oxygen)  Patient Saturations on Room Air at Rest = 77%  Patient Saturations on Room Air while Ambulating =%: did not ambulate without oxygen d/t RA saturations  Patient Saturations on 2 Liters of oxygen while Ambulating = 83%  Patient Saturations on 3L of oxygen while Ambulating= 88%  Please briefly explain why patient needs home oxygen:

## 2012-04-20 ENCOUNTER — Inpatient Hospital Stay (HOSPITAL_COMMUNITY): Payer: Medicare Other

## 2012-04-20 MED ORDER — LEVOFLOXACIN 500 MG PO TABS
500.0000 mg | ORAL_TABLET | Freq: Every day | ORAL | Status: DC
  Administered 2012-04-20: 500 mg via ORAL
  Filled 2012-04-20 (×2): qty 1

## 2012-04-20 NOTE — Progress Notes (Addendum)
Placed patient on telemetry.  Patient was not placed back on after returning from radiology.  The MT was told the telemetry order was cancelled.  I wasn't able to verify an order for cancellation so placed patient back on.

## 2012-04-20 NOTE — Progress Notes (Signed)
Removed from telemetry per order.

## 2012-04-20 NOTE — Progress Notes (Signed)
Provided patient handout on HF and low sodium diet

## 2012-04-20 NOTE — Plan of Care (Signed)
Problem: Discharge Progression Outcomes Goal: Home O2 if indicated Outcome: Progressing Home with HHPT/[facility], oxygen needed

## 2012-04-20 NOTE — Plan of Care (Signed)
Problem: Discharge Progression Outcomes Goal: Tolerating diet Outcome: Not Met (add Reason) Poor appetite

## 2012-04-20 NOTE — Progress Notes (Signed)
[facility] PROGRESS NOTE  Assessment/Plan:  Pleural effusion: Status post thoracentesis by pulmonary on 12/5. Appreciate assistance. Fluid sent for studies. For liters taken off. Patient symptomatically better.  Dyspnea (03/01/2012) - secondary to large pleural effusion in aprt. See above .  Some component of mild heart failure BNP >4000,  She is only diuresed approximately 1.2 L and is still notably hypoxic.  However still requiring 2 L of oxygen and was activity 3 L. Review of her CT on admission note signs of perhaps interstitial pneumonitis versus chronic restrictive disease. Have asked pulmonary to evaluate for further recommendations.  Chest x-ray done this morning noted signs of streaky infiltrate, but no recurrence of reaccumulation of pleural effusion fluid -strict I &O's low sodium diet. - EKG SR Left atrial enlargement. Incomplete RBBB - TSH WNL, never had Chest pain. Tolerating beta blocker - allergic reaction pancreatitis to ACE, ? If ARB have cross reactivity will discuss with pharmacy.    HTN (hypertension) (04/16/2012) - stable, monitor, for Low Bp starting Bp meds and diuresing.  Hypothyroidism (04/16/2012) - TSH 2.4   Consultants:  none  Procedures:  Echocardiogram done 12/3: Normal EF of 55%. Moderate left ventricular hypertrophy, moderate mitral regurg  Plan discussed with patient, prior to thoracentesis  Antibiotics: IV Rocephin and Zithromax given on admission. Not continued. Levaquin started on 12/7-day one  HPI/Subjective: Patient herself doing okay. Breathing comfortably on 2 L of oxygen, 3 L with activity. No chest pain.  Objective: Filed Vitals:   04/19/12 1314 04/19/12 2038 04/19/12 2146 04/20/12 0534  BP: 134/42 141/48 144/60 126/46  Pulse: 65 78 80 82  Temp: 97.8 F (36.6 C) 97.9 F (36.6 C)  97.9 F (36.6 C)  TempSrc: Oral Oral  Oral  Resp: 18 19    Height:      Weight:    73.3 kg (161 lb 9.6 oz)  SpO2: 99% 96%  97%     Intake/Output Summary (Last 24 hours) at 04/20/12 1250 Last data filed at 04/20/12 0945  Gross per 24 hour  Intake   1160 ml  Output   1450 ml  Net   -290 ml   Filed Weights   04/18/12 0605 04/19/12 0617 04/20/12 0534  Weight: 75.479 kg (166 lb 6.4 oz) 73.5 kg (162 lb 0.6 oz) 73.3 kg (161 lb 9.6 oz)    Exam:  General: Alert, awake, oriented x3, in no acute distress.  HEENT: No bruits, no goiter. + JVD + HJR Heart: Regular rate and rhythm, without murmurs, rubs, gallops.  Lungs: Good air movement,  both sides no clear to auscultation bilaterally  Abdomen: Soft, nontender, nondistended, positive bowel sounds.  Neuro: Grossly intact, nonfocal.   Data Reviewed: Basic Metabolic Panel:  Lab 04/19/12 1610 04/17/12 0505 04/16/12 0518 04/15/12 1133 04/15/12 1113  NA 138 140 140 -- 138  K 4.6 4.1 3.9 -- 3.7  CL 99 98 99 -- 97  CO2 33* 34* 36* -- 31  GLUCOSE 105* 108* 103* -- 183*  BUN 38* 37* 23 -- 25*  CREATININE 1.16* 1.35* 1.05 -- 0.93  CALCIUM 9.0 9.1 9.0 -- 9.3  MG -- -- -- 1.8 --  PHOS -- -- -- 3.2 --   Liver Function Tests:  Lab 04/18/12 1510 04/15/12 1113  AST -- 20  ALT -- 11  ALKPHOS -- 75  BILITOT -- 0.3  PROT 6.8 7.0  ALBUMIN -- 3.1*   CBC:  Lab 04/19/12 0458 04/16/12 0518 04/15/12 1113  WBC 7.3 6.7 7.7  NEUTROABS -- -- --  HGB 10.5* 10.7* 11.7*  HCT 33.5* 32.7* 35.4*  MCV 96.5 94.2 92.7  PLT 239 210 254   Cardiac Enzymes:  Lab 04/15/12 1113  CKTOTAL --  CKMB --  CKMBINDEX --  TROPONINI <0.30   BNP (last 3 results)  Basename 04/16/12 0518 04/15/12 1113  PROBNP 4011.0* 4040.0*        Studies: Ct Chest Wo Contrast  04/15/2012   IMPRESSION:  1.  Moderate to large-sized right pleural effusion. 2.  Small left pleural effusion. 3.  Compressive atelectasis in the right lung. 4.  Patchy interstitial prominence in both lungs.  Differential considerations include interstitial pulmonary edema and interstitial pneumonitis. 5.  Colonic  diverticulosis. 6.  Atheromatous arterial calcifications, including the coronary arteries. 7.  Evidence of anemia.   Original Report Authenticated By: Beckie Salts, M.D.     Scheduled Meds:    . antiseptic oral rinse  15 mL Mouth Rinse BID  . beta carotene w/minerals  1 tablet Oral Daily  . calcium carbonate  1 tablet Oral Daily  . cholecalciferol  1,000 Units Oral Daily  . dipyridamole-aspirin  1 capsule Oral BID  . ferrous sulfate  325 mg Oral Daily  . folic acid  1 mg Oral Daily  . furosemide  20 mg Oral QODAY  . levofloxacin  500 mg Oral Daily  . levothyroxine  100 mcg Oral Q0600  . metoprolol tartrate  12.5 mg Oral BID  . multivitamin with minerals  1 tablet Oral Daily  . pantoprazole  40 mg Oral Daily  . simvastatin  40 mg Oral QPM  . traMADol  50 mg Oral TID   Continuous Infusions:   Time spent: 20  minutes  Hollice Espy  [facility] Pager [phone]. If 8PM-8AM, please contact night-coverage at [email or web address], password [facility] 04/20/2012, 12:50 PM  LOS: 5 days

## 2012-04-20 NOTE — Progress Notes (Signed)
87 yowf quit smoking around 2000 with no resp problems with new onset doe since 2012 referred 03/01/2012 by Dr [name].  03/01/2012 1st pulmonary eval/ Wert cc doe x across the room x one year but only under two circumstances: to go to the BR when wakes up at night to urinate (note does not wake up due to pnd) worse in am p stirs but recovers p first hour better since started back on lasix Despite min hx of any leg swelling and no correlation between severity of leg swelling and doe. Sob never occurs at rest.    - R thoracentesis 12/5 x 1400 cc transudate   04/20/2012 f/u final hosp visit much better p thoracentesis but still ? 02 dependent.  No cp or cough or leg swelling    Objective:   Physical Exam  Well preserved pleasant wf nad  HEENT: nl dentition, turbinates, and orophanx. Nl external ear canals without cough reflex  NECK : without JVD/Nodes/TM/ nl carotid upstrokes bilaterally  LUNGS: no acc muscle use, clear to A and P bilaterally without cough on insp or exp maneuvers  CV: RRR no s3 or murmur or increase in P2, no edema  ABD: soft and nontender with nl excursion in the supine position. No bruits or organomegaly, bowel sounds nl  MS: warm without deformities, calf tenderness, cyanosis or clubbing  SKIN: warm and dry without lesions  NEURO: alert, approp, no deficits      Assessment & Plan:    Previous Version  Dyspnea - Jillian Hughs, MD 03/02/2012 8:09 AM Addendum  - Echo 11/13/08 c/w AS/MS and mod PAH  - Spirometry 02/21/12 mostly restrictive changes with ratio 64  - ONO RA rec 03/02/2012  - R thoracentesis 12/5 x 1400 cc transudate   Thus her symptoms are c/w L > R CHF so rec f/u by Dr Donnie Aho and f/u pulmonary prn   Respiratory Failure secondary to chf/ 02 dep for now   I will be happy to see her back in office on a prn basis but have asked her to see Dr Donnie Aho in the next two weeks to review findings    Jillian Hughs, MD Pulmonary and Critical Care Medicine [facility]  Healthcare Cell [phone]

## 2012-04-20 NOTE — Progress Notes (Signed)
Patient denies pain or discomfort 

## 2012-04-21 LAB — CULTURE, BLOOD (ROUTINE X 2): Culture: NO GROWTH

## 2012-04-21 LAB — BASIC METABOLIC PANEL
BUN: 39 mg/dL — ABNORMAL HIGH (ref 6–23)
Chloride: 96 mEq/L (ref 96–112)
GFR calc non Af Amer: 38 mL/min — ABNORMAL LOW (ref >90)
Glucose, Bld: 99 mg/dL (ref 70–99)
Potassium: 4.5 mEq/L (ref 3.5–5.1)
Sodium: 138 mEq/L (ref 135–145)

## 2012-04-21 LAB — BODY FLUID CULTURE: Culture: NO GROWTH

## 2012-04-21 MED ORDER — METOPROLOL TARTRATE 12.5 MG HALF TABLET: 12.5000 mg | ORAL_TABLET | Freq: Two times a day (BID) | ORAL | Status: DC

## 2012-04-21 MED ORDER — LEVOFLOXACIN 250 MG PO TABS
250.0000 mg | ORAL_TABLET | Freq: Every day | ORAL | Status: DC
  Administered 2012-04-21: 250 mg via ORAL
  Filled 2012-04-21: qty 1

## 2012-04-21 MED ORDER — LEVOFLOXACIN 250 MG PO TABS: 250.0000 mg | ORAL_TABLET | Freq: Every day | ORAL | Status: AC

## 2012-04-21 NOTE — Progress Notes (Signed)
SATURATION QUALIFICATIONS: (This note is used to comply with regulatory documentation for home oxygen)  Patient Saturations on Room Air at Rest = pending%  Patient Saturations on Room Air while Ambulating = pending%  Patient Saturations on 3L Liters of oxygen while Ambulating = pending%  Please briefly explain why patient needs home oxygen:

## 2012-04-21 NOTE — Progress Notes (Signed)
Called SW to make sure home oxygen with [facility] set-up.

## 2012-04-21 NOTE — Progress Notes (Signed)
Called [employer], company that provided oxygen tank.  Tank is empty and family need education on how to use it.  [facility] will come by with a new tank today.

## 2012-04-21 NOTE — Progress Notes (Signed)
   Pharmacy Consult:   Consult for antibiotic renal dose adjustment entered 12/2. No antibiotics ordered at that time.  Levaquin 500mg  po q24h started 12/7 for infiltrate seen on CXR.  SCr: 1.25 CrCl 33 ml/min WBC: 7k  Cultures: 12/5 pleural fluid: NGTD 12/3 sputum: normal flora 12/2 blood x2: NGRD  A/P:  Will adjust levaquin to 250mg  po daily  Will follow renal function & adjust further if needed  Loralee Pacas, PharmD, BCPS Pager: [phone] 04/21/2012 9:29 AM

## 2012-04-21 NOTE — Discharge Summary (Signed)
Physician Discharge Summary  [name] ZOX:[id number] DOB: 01-May-1925 DOA: 04/15/2012  PCP: [name], MD  Admit date: 04/15/2012 Discharge date: 04/21/2012  Time spent: 30 minutes  Recommendations for Outpatient Follow-up:  1. Patient is being discharged home with home O2. 2. She'll follow up with her PCP in the next one month. She'll followup with her cardiologist Dr. [name] in approximately 2 weeks  Discharge Diagnoses:  Principal Problem:  *Pleural effusion Active Problems:  Dyspnea  HTN (hypertension)  Hypothyroidism  Pulmonary edema   Discharge Condition: Improved, being discharged home with home oxygen  Diet recommendation: Heart healthy  Filed Weights   04/19/12 0617 04/20/12 0534 04/21/12 0500  Weight: 73.5 kg (162 lb 0.6 oz) 73.3 kg (161 lb 9.6 oz) 73.6 kg (162 lb 4.1 oz)    History of present illness:  78 year old white female with past medical history of hypertension, stroke and hypothyroidism who presented with orthopnea, one week of progressively worsening shortness of breath and cough. Chest x-ray and CT noted large pleural effusion on the right side with secondary compressive atelectasis and an elevated white blood cell count. Patient was admitted for acute respiratory failure. She started on diuresis and IV antibiotics.  Hospital Course:  Pleural effusion: Patient initially was started on Lasix. This was felt to be secondary to Some component of mild heart failure BNP >4000, She is only diuresed approximately 1.2 L after hospital day one and was still notably hypoxic. Echocardiogram done noted no signs of significant diastolic or systolic dysfunction. Repeat chest x-ray on day 2 noted persistence of pleural effusion so pulmonary was consulted for thoracentesis. Thoracentesis yielded about 4 L worth of fluid which ended up being transudative. Patient still remained hypoxic however required 2 L of oxygen and with activity, 3 L.  Review of her CT on admission  note signs of perhaps interstitial pneumonitis versus chronic restrictive disease. Pulmonary followed up in review of the patient's care felt that she had some or stricture lung disease as well as aortic and mitral stenosis moderate pulmonary arterial hypertension. He felt her symptoms were more consistent with a left greater than right-sided heart failure and felt at this point she was down to her dry weight and baseline O2 status. They recommended followup with Dr. Donnie Aho, the patient's cardiologist as outpatient she will be discharged on home oxygen.  Dyspnea (03/01/2012)  felt to be in part due to congestive heart failure-pleural effusion and bronchitis/pneumonia  Bronchitis/pneumonia: Patient initially received one dose of antibiotics on admission. Her white blood cell count normalized by day 2 and it looked to be more secondary pleural effusion and antibiotics were stopped. With persistence of hypoxia, repeat chest x-ray was checked on the morning of 12/7 and found have streaky infiltrate. Levaquin was restarted. She tolerated this well and will go home with 4 more days of by mouth Levaquin.  - allergic reaction pancreatitis to ACE, ? If ARB have cross reactivity will discuss with pharmacy.  HTN (hypertension) (04/16/2012)  stable. Next if on diuresis, the patient's blood pressure fell in her medications were put on hold. They've since been resumed and her blood pressure and it discharges at 126/66.  Hypothyroidism (04/16/2012)  stable. Continue on Synthroid. TSH 2.4   Procedures:  Echocardiogram done 12/3: Moderate mitral regurg, preserved ejection fraction of 55%. Left ventricular hypertrophy  Thoracentesis done 12/5: Yielding greater than 4 L of fluid looking to be transudative  Consultations:  Francella Solian, [facility]  Discharge Exam: Filed Vitals:   04/20/12 1415  04/21/12 0500 04/21/12 0503 04/21/12 1000  BP: 117/30  136/39 126/36  Pulse: 53  81 61  Temp: 98 F (36.7 C)   98.9 F (37.2 C) 97.8 F (36.6 C)  TempSrc: Oral  Oral Oral  Resp: 18  18 18   Height:      Weight:  73.6 kg (162 lb 4.1 oz)    SpO2: 98%  94%     General: Alert and oriented x2, no acute distress, fatigued Cardiovascular: Borderline bradycardia, regular rate and rhythm but with occasional ectopic beats Respiratory: Clear to auscultation bilaterally, decreased breath sounds throughout Abdomen: Soft, nontender, nondistended, positive bowel sounds Extremities: No clubbing or cyanosis or edema  Discharge Instructions  Discharge Orders    Future Orders Please Complete By Expires   Diet - low sodium heart healthy      Increase activity slowly      Walk with assistance          Medication List     As of 04/21/2012  1:11 PM    TAKE these medications         beta carotene w/minerals tablet   Take 1 tablet by mouth daily.      calcium carbonate 1250 MG tablet   Commonly known as: OS-CAL - dosed in mg of elemental calcium   Take 1 tablet by mouth daily.      cholecalciferol 1000 UNITS tablet   Commonly known as: VITAMIN D   Take 1,000 Units by mouth daily.      dipyridamole-aspirin 200-25 MG per 12 hr capsule   Commonly known as: AGGRENOX   Take 1 capsule by mouth 2 (two) times daily.      folic acid 1 MG tablet   Commonly known as: FOLVITE   Take 1 mg by mouth daily.      furosemide 20 MG tablet   Commonly known as: LASIX   Take 20 mg by mouth every other day.      glucosamine-chondroitin 500-400 MG tablet   Take 1 tablet by mouth 3 (three) times daily.      Iron 325 (65 FE) MG Tabs   Take 1 tablet by mouth daily.      levofloxacin 250 MG tablet   Commonly known as: LEVAQUIN   Take 1 tablet (250 mg total) by mouth daily at 2 PM daily at 2 PM.   Start taking on: 04/22/2012      levothyroxine 100 MCG tablet   Commonly known as: SYNTHROID, LEVOTHROID   Take 100 mcg by mouth daily.      methocarbamol 500 MG tablet   Commonly known as: ROBAXIN   Take 500 mg by  mouth 3 (three) times daily as needed. Muscle spasms      metoprolol tartrate 12.5 mg Tabs   Commonly known as: LOPRESSOR   Take 0.5 tablets (12.5 mg total) by mouth 2 (two) times daily.      multivitamin with minerals Tabs   Take 1 tablet by mouth daily.      omeprazole 40 MG capsule   Commonly known as: PRILOSEC   Take 40 mg by mouth daily.      simvastatin 40 MG tablet   Commonly known as: ZOCOR   Take 40 mg by mouth every evening.      traMADol 50 MG tablet   Commonly known as: ULTRAM   Take 50 mg by mouth 3 (three) times daily. For pain relief  The results of significant diagnostics from this hospitalization (including imaging, microbiology, ancillary and laboratory) are listed below for reference.    Significant Diagnostic Studies: Dg Chest 2 View  04/20/2012   IMPRESSION: There is small right pleural effusion with right basilar atelectasis or infiltrate.  Stable left basilar atelectasis or infiltrate. Again noted streaky airspace disease in the right upper lobe.   Original Report Authenticated By: Natasha Mead, M.D.    Dg Chest 2 View  04/17/2012 IMPRESSION: Increased right pleural effusion and basilar atelectasis. COPD with minimal left pleural effusion and subsegmental atelectasis left lower lobe.   Original Report Authenticated By: Ulyses Southward, M.D.    Dg Chest 2 View  04/15/2012  IMPRESSION: Cardiomegaly, question interstitial edema.  Worsening right pleural effusion, now moderate.  Increasing right lower lobe atelectasis or consolidation.   Original Report Authenticated By: Charlett Nose, M.D.    Ct Chest Wo Contrast  04/15/2012    IMPRESSION:  1.  Moderate to large-sized right pleural effusion. 2.  Small left pleural effusion. 3.  Compressive atelectasis in the right lung. 4.  Patchy interstitial prominence in both lungs.  Differential considerations include interstitial pulmonary edema and interstitial pneumonitis. 5.  Colonic diverticulosis. 6.  Atheromatous  arterial calcifications, including the coronary arteries. 7.  Evidence of anemia.   Original Report Authenticated By: Beckie Salts, M.D.    Dg Chest Port 1 View  04/18/2012    IMPRESSION:  1.  No pneumothorax after thoracentesis.  Small residual right effusion. 2.  New linear atelectasis at the left lung base. 3.  New slight pulmonary vascular congestion.   Original Report Authenticated By: Francene Boyers, M.D.     Microbiology: Recent Results (from the past 240 hour(s))  CULTURE, BLOOD (ROUTINE X 2)     Status: Normal   Collection Time   04/15/12  1:30 PM      Component Value Range Status Comment   Specimen Description BLOOD RIGHT ARM   Final    Special Requests BOTTLES DRAWN AEROBIC AND ANAEROBIC 5CC   Final    Culture  Setup Time 04/15/2012 23:09   Final    Culture NO GROWTH 5 DAYS   Final    Report Status 04/21/2012 FINAL   Final   CULTURE, BLOOD (ROUTINE X 2)     Status: Normal   Collection Time   04/15/12  1:35 PM      Component Value Range Status Comment   Specimen Description BLOOD RIGHT ARM   Final    Special Requests BOTTLES DRAWN AEROBIC ONLY 4CC   Final    Culture  Setup Time 04/15/2012 23:08   Final    Culture NO GROWTH 5 DAYS   Final    Report Status 04/21/2012 FINAL   Final   CULTURE, EXPECTORATED SPUTUM-ASSESSMENT     Status: Normal   Collection Time   04/16/12  8:31 AM      Component Value Range Status Comment   Specimen Description SPUTUM   Final    Special Requests Normal   Final    Sputum evaluation     Final    Value: THIS SPECIMEN IS ACCEPTABLE. RESPIRATORY CULTURE REPORT TO FOLLOW.   Report Status 04/16/2012 FINAL   Final   CULTURE, RESPIRATORY     Status: Normal   Collection Time   04/16/12  8:31 AM      Component Value Range Status Comment   Specimen Description SPUTUM   Final    Special Requests NONE   Final  Gram Stain     Final    Value: FEW WBC PRESENT, PREDOMINANTLY PMN     RARE SQUAMOUS EPITHELIAL CELLS PRESENT     RARE GRAM NEGATIVE RODS      RARE GRAM POSITIVE RODS     FEW GRAM POSITIVE COCCI   Culture NORMAL OROPHARYNGEAL FLORA   Final    Report Status 04/18/2012 FINAL   Final   BODY FLUID CULTURE     Status: Normal   Collection Time   04/18/12  2:00 PM      Component Value Range Status Comment   Specimen Description PLEURAL   Final    Special Requests Normal   Final    Gram Stain     Final    Value: RARE WBC PRESENT, PREDOMINANTLY MONONUCLEAR     NO ORGANISMS SEEN   Culture NO GROWTH 3 DAYS   Final    Report Status 04/21/2012 FINAL   Final      Labs: Basic Metabolic Panel:  Lab 04/21/12 6962 04/19/12 0458 04/17/12 0505 04/16/12 0518 04/15/12 1133 04/15/12 1113  NA 138 138 140 140 -- 138  K 4.5 4.6 4.1 3.9 -- 3.7  CL 96 99 98 99 -- 97  CO2 38* 33* 34* 36* -- 31  GLUCOSE 99 105* 108* 103* -- 183*  BUN 39* 38* 37* 23 -- 25*  CREATININE 1.25* 1.16* 1.35* 1.05 -- 0.93  CALCIUM 9.0 9.0 9.1 9.0 -- 9.3  MG -- -- -- -- 1.8 --  PHOS -- -- -- -- 3.2 --   Liver Function Tests:  Lab 04/18/12 1510 04/15/12 1113  AST -- 20  ALT -- 11  ALKPHOS -- 75  BILITOT -- 0.3  PROT 6.8 7.0  ALBUMIN -- 3.1*   CBC:  Lab 04/19/12 0458 04/16/12 0518 04/15/12 1113  WBC 7.3 6.7 7.7  NEUTROABS -- -- --  HGB 10.5* 10.7* 11.7*  HCT 33.5* 32.7* 35.4*  MCV 96.5 94.2 92.7  PLT 239 210 254   Cardiac Enzymes:  Lab 04/15/12 1113  CKTOTAL --  CKMB --  CKMBINDEX --  TROPONINI <0.30   BNP: BNP (last 3 results)  Basename 04/16/12 0518 04/15/12 1113  PROBNP 4011.0* 4040.0*      Signed:  KRISHNAN,SENDIL K  [facility] 04/21/2012, 1:11 PM

## 2012-04-25 LAB — MISCELLANEOUS TEST

## 2012-04-26 ENCOUNTER — Encounter: Payer: Self-pay | Admitting: Cardiology

## 2012-04-26 DIAGNOSIS — I2510 Atherosclerotic heart disease of native coronary artery without angina pectoris: Secondary | ICD-10-CM

## 2012-05-16 ENCOUNTER — Encounter (HOSPITAL_COMMUNITY): Payer: Self-pay | Admitting: *Deleted

## 2012-05-16 ENCOUNTER — Inpatient Hospital Stay (HOSPITAL_COMMUNITY)
Admission: EM | Admit: 2012-05-16 | Discharge: 2012-05-25 | DRG: 186 | Disposition: A | Discharge status: Home Health | Payer: Medicare Other | Attending: Internal Medicine | Admitting: Internal Medicine

## 2012-05-16 ENCOUNTER — Emergency Department (HOSPITAL_COMMUNITY): Payer: Medicare Other

## 2012-05-16 DIAGNOSIS — E785 Hyperlipidemia, unspecified: Secondary | ICD-10-CM | POA: Diagnosis present

## 2012-05-16 DIAGNOSIS — M546 Pain in thoracic spine: Secondary | ICD-10-CM | POA: Diagnosis present

## 2012-05-16 DIAGNOSIS — R0902 Hypoxemia: Secondary | ICD-10-CM | POA: Diagnosis present

## 2012-05-16 DIAGNOSIS — I272 Pulmonary hypertension, unspecified: Secondary | ICD-10-CM | POA: Diagnosis present

## 2012-05-16 DIAGNOSIS — I2789 Other specified pulmonary heart diseases: Secondary | ICD-10-CM | POA: Diagnosis present

## 2012-05-16 DIAGNOSIS — I2510 Atherosclerotic heart disease of native coronary artery without angina pectoris: Secondary | ICD-10-CM

## 2012-05-16 DIAGNOSIS — I050 Rheumatic mitral stenosis: Secondary | ICD-10-CM | POA: Diagnosis present

## 2012-05-16 DIAGNOSIS — I5030 Unspecified diastolic (congestive) heart failure: Secondary | ICD-10-CM

## 2012-05-16 DIAGNOSIS — J189 Pneumonia, unspecified organism: Secondary | ICD-10-CM | POA: Diagnosis present

## 2012-05-16 DIAGNOSIS — I5033 Acute on chronic diastolic (congestive) heart failure: Secondary | ICD-10-CM | POA: Diagnosis present

## 2012-05-16 DIAGNOSIS — Z8673 Personal history of transient ischemic attack (TIA), and cerebral infarction without residual deficits: Secondary | ICD-10-CM

## 2012-05-16 DIAGNOSIS — H5440 Blindness, one eye, unspecified eye: Secondary | ICD-10-CM | POA: Diagnosis present

## 2012-05-16 DIAGNOSIS — E039 Hypothyroidism, unspecified: Secondary | ICD-10-CM

## 2012-05-16 DIAGNOSIS — I509 Heart failure, unspecified: Secondary | ICD-10-CM | POA: Diagnosis present

## 2012-05-16 DIAGNOSIS — N189 Chronic kidney disease, unspecified: Secondary | ICD-10-CM | POA: Diagnosis present

## 2012-05-16 DIAGNOSIS — I10 Essential (primary) hypertension: Secondary | ICD-10-CM

## 2012-05-16 DIAGNOSIS — J9611 Chronic respiratory failure with hypoxia: Secondary | ICD-10-CM

## 2012-05-16 DIAGNOSIS — I340 Nonrheumatic mitral (valve) insufficiency: Secondary | ICD-10-CM

## 2012-05-16 DIAGNOSIS — M549 Dorsalgia, unspecified: Secondary | ICD-10-CM | POA: Diagnosis present

## 2012-05-16 DIAGNOSIS — I119 Hypertensive heart disease without heart failure: Secondary | ICD-10-CM

## 2012-05-16 DIAGNOSIS — I052 Rheumatic mitral stenosis with insufficiency: Secondary | ICD-10-CM | POA: Diagnosis present

## 2012-05-16 DIAGNOSIS — N179 Acute kidney failure, unspecified: Secondary | ICD-10-CM | POA: Diagnosis present

## 2012-05-16 DIAGNOSIS — J90 Pleural effusion, not elsewhere classified: Principal | ICD-10-CM | POA: Diagnosis present

## 2012-05-16 DIAGNOSIS — J9610 Chronic respiratory failure, unspecified whether with hypoxia or hypercapnia: Secondary | ICD-10-CM | POA: Diagnosis present

## 2012-05-16 DIAGNOSIS — G8929 Other chronic pain: Secondary | ICD-10-CM | POA: Diagnosis present

## 2012-05-16 DIAGNOSIS — R0600 Dyspnea, unspecified: Secondary | ICD-10-CM

## 2012-05-16 DIAGNOSIS — E873 Alkalosis: Secondary | ICD-10-CM | POA: Diagnosis present

## 2012-05-16 DIAGNOSIS — Z66 Do not resuscitate: Secondary | ICD-10-CM | POA: Diagnosis present

## 2012-05-16 DIAGNOSIS — Z79899 Other long term (current) drug therapy: Secondary | ICD-10-CM

## 2012-05-16 DIAGNOSIS — Z87891 Personal history of nicotine dependence: Secondary | ICD-10-CM

## 2012-05-16 DIAGNOSIS — I130 Hypertensive heart and chronic kidney disease with heart failure and stage 1 through stage 4 chronic kidney disease, or unspecified chronic kidney disease: Secondary | ICD-10-CM | POA: Diagnosis present

## 2012-05-16 HISTORY — DX: Chronic respiratory failure with hypoxia: J96.11

## 2012-05-16 HISTORY — DX: Pulmonary hypertension, unspecified: I27.20

## 2012-05-16 HISTORY — DX: Hypertensive heart disease without heart failure: I11.9

## 2012-05-16 HISTORY — DX: Unspecified macular degeneration: H35.30

## 2012-05-16 HISTORY — DX: Blindness, one eye, unspecified eye: H54.40

## 2012-05-16 LAB — CBC WITH DIFFERENTIAL/PLATELET
Basophils Relative: 0 % (ref 0–1)
Eosinophils Absolute: 0.2 10*3/uL (ref 0.0–0.7)
Hemoglobin: 11.2 g/dL — ABNORMAL LOW (ref 12.0–15.0)
MCH: 31.2 pg (ref 26.0–34.0)
MCHC: 32.7 g/dL (ref 30.0–36.0)
Monocytes Absolute: 0.8 10*3/uL (ref 0.1–1.0)
Monocytes Relative: 12 % (ref 3–12)
Neutrophils Relative %: 73 % (ref 43–77)
RDW: 13.1 % (ref 11.5–15.5)

## 2012-05-16 LAB — BASIC METABOLIC PANEL
BUN: 28 mg/dL — ABNORMAL HIGH (ref 6–23)
Creatinine, Ser: 1.01 mg/dL (ref 0.50–1.10)
GFR calc Af Amer: 57 mL/min — ABNORMAL LOW (ref >90)
GFR calc non Af Amer: 49 mL/min — ABNORMAL LOW (ref >90)
Potassium: 4.6 mEq/L (ref 3.5–5.1)

## 2012-05-16 MED ORDER — SIMVASTATIN 40 MG PO TABS
40.0000 mg | ORAL_TABLET | Freq: Every day | ORAL | Status: DC
  Administered 2012-05-17 – 2012-05-24 (×8): 40 mg via ORAL
  Filled 2012-05-16 (×9): qty 1

## 2012-05-16 MED ORDER — PIPERACILLIN-TAZOBACTAM 3.375 G IVPB
3.3750 g | Freq: Once | INTRAVENOUS | Status: AC
  Administered 2012-05-16: 3.375 g via INTRAVENOUS
  Filled 2012-05-16: qty 50

## 2012-05-16 MED ORDER — TRAMADOL HCL 50 MG PO TABS
50.0000 mg | ORAL_TABLET | Freq: Four times a day (QID) | ORAL | Status: DC | PRN
  Administered 2012-05-16 – 2012-05-22 (×6): 50 mg via ORAL
  Filled 2012-05-16 (×7): qty 1

## 2012-05-16 MED ORDER — MORPHINE SULFATE 2 MG/ML IJ SOLN
1.0000 mg | Freq: Once | INTRAMUSCULAR | Status: AC
  Administered 2012-05-16: 1 mg via INTRAVENOUS
  Filled 2012-05-16: qty 1

## 2012-05-16 MED ORDER — ASPIRIN-DIPYRIDAMOLE ER 25-200 MG PO CP12
1.0000 | ORAL_CAPSULE | Freq: Two times a day (BID) | ORAL | Status: DC
  Administered 2012-05-17 – 2012-05-25 (×18): 1 via ORAL
  Filled 2012-05-16 (×19): qty 1

## 2012-05-16 MED ORDER — POLYVINYL ALCOHOL 1.4 % OP SOLN
2.0000 [drp] | OPHTHALMIC | Status: DC | PRN
  Administered 2012-05-16: 2 [drp] via OPHTHALMIC
  Filled 2012-05-16: qty 15

## 2012-05-16 MED ORDER — LEVOFLOXACIN IN D5W 750 MG/150ML IV SOLN
750.0000 mg | INTRAVENOUS | Status: DC
  Administered 2012-05-17: 750 mg via INTRAVENOUS
  Filled 2012-05-16: qty 150

## 2012-05-16 MED ORDER — SODIUM CHLORIDE 0.9 % IJ SOLN: 3.0000 mL | INTRAMUSCULAR | Status: DC | PRN

## 2012-05-16 MED ORDER — LEVOTHYROXINE SODIUM 100 MCG PO TABS
100.0000 ug | ORAL_TABLET | Freq: Every day | ORAL | Status: DC
  Administered 2012-05-17 – 2012-05-25 (×9): 100 ug via ORAL
  Filled 2012-05-16 (×9): qty 1

## 2012-05-16 MED ORDER — OXYCODONE-ACETAMINOPHEN 5-325 MG PO TABS
1.0000 | ORAL_TABLET | Freq: Four times a day (QID) | ORAL | Status: AC | PRN
  Administered 2012-05-17 (×2): 2 via ORAL
  Filled 2012-05-16 (×2): qty 2

## 2012-05-16 MED ORDER — PANTOPRAZOLE SODIUM 40 MG PO TBEC
40.0000 mg | DELAYED_RELEASE_TABLET | Freq: Every day | ORAL | Status: DC
  Administered 2012-05-17 – 2012-05-25 (×9): 40 mg via ORAL
  Filled 2012-05-16 (×9): qty 1

## 2012-05-16 MED ORDER — SODIUM CHLORIDE 0.9 % IJ SOLN
3.0000 mL | Freq: Two times a day (BID) | INTRAMUSCULAR | Status: DC
  Administered 2012-05-16 – 2012-05-23 (×3): 3 mL via INTRAVENOUS

## 2012-05-16 MED ORDER — METOPROLOL TARTRATE 12.5 MG HALF TABLET
12.5000 mg | ORAL_TABLET | Freq: Two times a day (BID) | ORAL | Status: DC
  Administered 2012-05-17 – 2012-05-25 (×17): 12.5 mg via ORAL
  Filled 2012-05-16 (×19): qty 1

## 2012-05-16 MED ORDER — LEVOFLOXACIN IN D5W 750 MG/150ML IV SOLN
750.0000 mg | INTRAVENOUS | Status: DC
  Filled 2012-05-16: qty 150

## 2012-05-16 MED ORDER — SODIUM CHLORIDE 0.9 % IV SOLN: 250.0000 mL | INTRAVENOUS | Status: DC | PRN

## 2012-05-16 MED ORDER — METHOCARBAMOL 500 MG PO TABS
500.0000 mg | ORAL_TABLET | Freq: Three times a day (TID) | ORAL | Status: DC | PRN
  Administered 2012-05-17 – 2012-05-25 (×8): 500 mg via ORAL
  Filled 2012-05-16 (×9): qty 1

## 2012-05-16 MED ORDER — SODIUM CHLORIDE 0.9 % IV SOLN
Freq: Once | INTRAVENOUS | Status: AC
  Administered 2012-05-16: 20 mL/h via INTRAVENOUS

## 2012-05-16 MED ORDER — FUROSEMIDE 20 MG PO TABS
20.0000 mg | ORAL_TABLET | Freq: Every day | ORAL | Status: DC
  Administered 2012-05-17: 20 mg via ORAL
  Filled 2012-05-16: qty 1

## 2012-05-16 NOTE — ED Notes (Signed)
2 unsuccessful IV attempts, pt aware another RN will attempt to start IV.

## 2012-05-16 NOTE — ED Notes (Signed)
Attempted to call RN for pt on floor, stated sign and held orders need to be put in for pt.

## 2012-05-16 NOTE — ED Provider Notes (Addendum)
History     CSN: [id number]  Arrival date & time 05/16/12  1603   First MD Initiated Contact with Patient 05/16/12 1738      Chief Complaint  Patient presents with  . Shortness of Breath  . Back Pain    (Consider location/radiation/quality/duration/timing/severity/associated sxs/prior treatment) HPI Comments: Patient presents with shortness of breath and right upper back pain. She was recently admitted in December for a right-sided pleural effusion which was drained with thoracentesis. She was also treated with antibiotics. She got better and went home. She is maintained on chronic oxygen at 2 L per minute. She states over last few days she's had increased shortness of breath with frequent oxygen desaturations.  She's had ongoing pain to her right upper back around her right scapula for about a year which is worsened in the last 6 months. She states the pain has been worse over the last few days and she went to an orthopedic office to see if there was musculoskeletal in nature and while she was here she had x-ray which showed fluid in her lungs so she was sent over here for evaluation. She denies any chest pain. She does complain of frequent shortness of breath with any kind of exertion. She denies any leg pain or swelling. She has a nonproductive cough. She denies any fevers or chills. She denies he nausea vomiting or diarrhea.   Past Medical History  Diagnosis Date  . Hypertension   . Stroke   . Anemia   . Hyperlipidemia   . Vitamin D deficiency   . Hypothyroid     Past Surgical History  Procedure Date  . Cataract extraction, bilateral     Family History  Problem Relation Age of Onset  . Heart disease Mother   . Heart disease Father   . Heart disease Brother   . Breast cancer Daughter   . Lymphoma Brother     History  Substance Use Topics  . Smoking status: Former Smoker -- 0.5 packs/day for 50 years    Types: Cigarettes    Quit date: 05/15/1998  . Smokeless tobacco:  Never Used  . Alcohol Use: No    OB History    Grav Para Term Preterm Abortions TAB SAB Ect Mult Living                  Review of Systems  Constitutional: Positive for fatigue. Negative for fever, chills and diaphoresis.  HENT: Negative for congestion, rhinorrhea and sneezing.   Eyes: Negative.   Respiratory: Positive for cough and shortness of breath. Negative for chest tightness.   Cardiovascular: Negative for chest pain and leg swelling.  Gastrointestinal: Negative for nausea, vomiting, abdominal pain, diarrhea and blood in stool.  Genitourinary: Negative for frequency, hematuria, flank pain and difficulty urinating.  Musculoskeletal: Positive for back pain. Negative for arthralgias.  Skin: Negative for rash.  Neurological: Negative for dizziness, speech difficulty, weakness, numbness and headaches.    Allergies  Lisinopril  Home Medications   Current Outpatient Rx  Name  Route  Sig  Dispense  Refill  . OCUVITE PO TABS   Oral   Take 1 tablet by mouth daily.         Marland Kitchen CALCIUM CARBONATE 1250 MG PO TABS   Oral   Take 1 tablet by mouth daily.         Marland Kitchen VITAMIN D 1000 UNITS PO TABS   Oral   Take 1,000 Units by mouth daily.         Marland Kitchen  ASPIRIN-DIPYRIDAMOLE ER 25-200 MG PO CP12   Oral   Take 1 capsule by mouth 2 (two) times daily.         . IRON 325 (65 FE) MG PO TABS   Oral   Take 1 tablet by mouth daily.         Marland Kitchen FOLIC ACID 1 MG PO TABS   Oral   Take 1 mg by mouth daily.         . FUROSEMIDE 20 MG PO TABS   Oral   Take 20 mg by mouth every other day.         Marland Kitchen GLUCOSAMINE-CHONDROITIN 500-400 MG PO TABS   Oral   Take 1 tablet by mouth 3 (three) times daily.         Marland Kitchen LEVOTHYROXINE SODIUM 100 MCG PO TABS   Oral   Take 100 mcg by mouth daily.         Marland Kitchen METHOCARBAMOL 500 MG PO TABS   Oral   Take 500 mg by mouth 3 (three) times daily as needed. Muscle spasms         . METOPROLOL TARTRATE 12.5 MG HALF TABLET   Oral   Take 0.5 tablets  (12.5 mg total) by mouth 2 (two) times daily.         . ADULT MULTIVITAMIN W/MINERALS CH   Oral   Take 1 tablet by mouth daily.         Marland Kitchen OMEPRAZOLE 40 MG PO CPDR   Oral   Take 40 mg by mouth daily.         Marland Kitchen SIMVASTATIN 40 MG PO TABS   Oral   Take 40 mg by mouth every evening.         Marland Kitchen TRAMADOL HCL 50 MG PO TABS   Oral   Take 50 mg by mouth 3 (three) times daily. For pain relief           BP 165/68  Pulse 72  Temp 97.2 F (36.2 C) (Oral)  Resp 20  SpO2 100%  Physical Exam  Constitutional: She is oriented to person, place, and time. She appears well-developed and well-nourished.  HENT:  Head: Normocephalic and atraumatic.  Eyes: Pupils are equal, round, and reactive to light.  Neck: Normal range of motion. Neck supple.  Cardiovascular: Normal rate, regular rhythm and normal heart sounds.   Pulmonary/Chest: Effort normal. No respiratory distress. She has no wheezes. She has rales. She exhibits no tenderness.  Abdominal: Soft. Bowel sounds are normal. There is no tenderness. There is no rebound and no guarding.  Musculoskeletal: Normal range of motion. She exhibits no edema.       Mild TTP right upper back.  No pain along the spine  Lymphadenopathy:    She has no cervical adenopathy.  Neurological: She is alert and oriented to person, place, and time.  Skin: Skin is warm and dry. No rash noted.  Psychiatric: She has a normal mood and affect.    ED Course  Procedures (including critical care time)  Results for orders placed during the hospital encounter of 05/16/12  CBC WITH DIFFERENTIAL      Component Value Range   WBC 6.2  4.0 - 10.5 K/uL   RBC 3.59 (*) 3.87 - 5.11 MIL/uL   Hemoglobin 11.2 (*) 12.0 - 15.0 g/dL   HCT 16.1 (*) 09.6 - 04.5 %   MCV 95.3  78.0 - 100.0 fL   MCH 31.2  26.0 - 34.0 pg  MCHC 32.7  30.0 - 36.0 g/dL   RDW 16.1  09.6 - 04.5 %   Platelets 220  150 - 400 K/uL   Neutrophils Relative 73  43 - 77 %   Neutro Abs 4.5  1.7 - 7.7  K/uL   Lymphocytes Relative 12  12 - 46 %   Lymphs Abs 0.7  0.7 - 4.0 K/uL   Monocytes Relative 12  3 - 12 %   Monocytes Absolute 0.8  0.1 - 1.0 K/uL   Eosinophils Relative 3  0 - 5 %   Eosinophils Absolute 0.2  0.0 - 0.7 K/uL   Basophils Relative 0  0 - 1 %   Basophils Absolute 0.0  0.0 - 0.1 K/uL  BASIC METABOLIC PANEL      Component Value Range   Sodium 136  135 - 145 mEq/L   Potassium 4.6  3.5 - 5.1 mEq/L   Chloride 91 (*) 96 - 112 mEq/L   CO2 38 (*) 19 - 32 mEq/L   Glucose, Bld 94  70 - 99 mg/dL   BUN 28 (*) 6 - 23 mg/dL   Creatinine, Ser 4.09  0.50 - 1.10 mg/dL   Calcium 81.1  8.4 - 91.4 mg/dL   GFR calc non Af Amer 49 (*) >90 mL/min   GFR calc Af Amer 57 (*) >90 mL/min  POCT I-STAT TROPONIN I      Component Value Range   Troponin i, poc 0.00  0.00 - 0.08 ng/mL   Comment 3            Dg Chest 2 View  05/16/2012  *RADIOLOGY REPORT*  Clinical Data: Short of breath  CHEST - 2 VIEW  Comparison: 04/20/2012  Findings: Normal heart size.  There is a moderate-sized right pleural effusion which is increased in volume from previous exam. Smaller left effusion is also increased from previous study. Atelectasis is noted in both lung bases.  There is consolidation in the left lower lobe.  IMPRESSION:  1.  Interval increase in volume of bilateral pleural effusions. 2.  Bibasilar atelectasis 3.  Left lower lobe airspace consolidation.   Original Report Authenticated By: Signa Kell, M.D.    Dg Chest 2 View  04/20/2012  *RADIOLOGY REPORT*  Clinical Data: Hypoxia, pleural effusion  CHEST - 2 VIEW  Comparison: 04/18/2012  Findings: Cardiomediastinal silhouette is stable.  There is small right pleural effusion with right basilar atelectasis or infiltrate.  Stable left basilar atelectasis or infiltrate.  Stable streaky airspace disease in the right upper lobe.  No diagnostic pneumothorax.  IMPRESSION: There is small right pleural effusion with right basilar atelectasis or infiltrate.  Stable left  basilar atelectasis or infiltrate. Again noted streaky airspace disease in the right upper lobe.   Original Report Authenticated By: Natasha Mead, M.D.    Dg Chest 2 View  04/17/2012  *RADIOLOGY REPORT*  Clinical Data: Follow up pleural effusion, hypoxia  CHEST - 2 VIEW  Comparison: 04/15/2012  Findings: Enlargement of cardiac silhouette with pulmonary vascular congestion. Atherosclerotic calcification aorta. Increase in right pleural effusion since previous exam. Increased right basilar opacification likely atelectasis though underlying mass and infiltrate not excluded. Subsegmental atelectasis and tiny effusion at left lung base. Underlying COPD. Upper lungs clear. No pneumothorax. Scattered endplate spur formation thoracic spine.  IMPRESSION: Increased right pleural effusion and basilar atelectasis. COPD with minimal left pleural effusion and subsegmental atelectasis left lower lobe.   Original Report Authenticated By: Ulyses Southward, M.D.    Dg Chest  Port 1 View  04/18/2012  *RADIOLOGY REPORT*  Clinical Data: Right pleural effusion.  Status post thoracentesis.  PORTABLE CHEST - 1 VIEW  Comparison: 04/17/2012  Findings: There has been marked reduction in the large right pleural effusion with only a small residual effusion.  No pneumothorax.  Marked improvement in the aeration of the right lung.  There is new linear atelectasis at the left lung base.  Heart size is normal.  Slight pulmonary vascular prominence is new.  IMPRESSION:  1.  No pneumothorax after thoracentesis.  Small residual right effusion. 2.  New linear atelectasis at the left lung base. 3.  New slight pulmonary vascular congestion.   Original Report Authenticated By: Francene Boyers, M.D.     Date: 05/16/2012  Rate: 74  Rhythm: normal sinus rhythm  QRS Axis: normal  Intervals: normal  ST/T Wave abnormalities: normal  Conduction Disutrbances:none  Narrative Interpretation:   Old EKG Reviewed: unchanged      1. Healthcare-associated  pneumonia       MDM  Chest x-ray shows a interval increase in the size of the right-sided pleural effusion with some left lower lung consolidation. This was interpreted by the radiologist and reviewed by me as well. I did start her on antibiotics covering for hospital acquired pneumonia. Her action saturation is in the mid to upper 90s on nasal cannula. I will consult the hospitalist for admission.        Rolan Bucco, MD 05/16/12 1610  Rolan Bucco, MD 05/16/12 2200

## 2012-05-16 NOTE — Progress Notes (Signed)
  Pharmacy Note (Brief) - Renal Dose Adjustment  79 yo F admitted with CAP. Order for Levaquin 750mg  IV q24h was adjusted to q48h for CrCl<45ml/min. Total of 5 days as per original order.  Darrol Angel, PharmD Pager: [phone] 05/16/2012 10:39 PM

## 2012-05-16 NOTE — ED Notes (Signed)
EDP advised of need for holding orders.

## 2012-05-16 NOTE — ED Notes (Signed)
Monique, RN called, stated she will call back for report.

## 2012-05-16 NOTE — ED Notes (Signed)
Pt sent to ER from Orthopedic dr office. Pt was in to see ortho doctor for back pain. XR was performed and pt was told "your lungs are filling back up with fluid." pt denies SOB. pts sats on RA 80%. Pt c/o back pain.

## 2012-05-16 NOTE — ED Notes (Signed)
Hospitalist at bedside 

## 2012-05-16 NOTE — H&P (Signed)
History and Physical  [name]:[id number] DOB: 1924-09-22 DOA: 05/16/2012  Referring physician: Dr. Fredderick Phenix PCP: [name], MD  Pulmonologist: [name], M.D. Cardiologist: [name], M.D.  Chief Complaint: Shortness of breath  HPI:  79 year old woman presented to the emergency department with progressive shortness of breath, orthopnea, dyspnea on exertion right upper back pain. Chest x-ray demonstrated large right pleural effusion and patient referred for admission.  Patient was hospitalized 04/2012 for pleural effusion, further details as below. Since discharge from the hospital she has had progressive shortness of breath, starting with orthopnea at night progressing to shortness of breath during the day and with exertion. She complains of right upper back pain which has been present for some time which has become excruciating over the last few weeks. Hypoxia at home was noted despite oxygen therapy. She was seen in orthopedic office today for upper back pain. Chest x-ray at that time demonstrated a large right pleural effusion and she was referred to the emergency department for further evaluation.  She does complain of nonproductive cough, but no fever, chills.  In the emergency department  found  to be afebrile with stable vital signs. CBC, basic metabolic panel, point-of-care troponin unremarkable. Chest x-ray demonstrated increased right pleural effusions. Left lower lobe airspace  consolidation seen. EKG sinus rhythm, no acute changes.  Chart Review:  04/2012 hospitalized for pleural effusion. Did not respond to Lasix. Underwent thoracentesis with removal of 4 L, transudate. CT raised the question of interstitial pneumonitis versus chronic restrictive lung disease. It was thought her symptoms were secondary to left greater than right-sided heart failure.  2-D echocardiogram 04/2012--left ventricular ejection fraction 55%. Moderate mitral regurgitation.  Review of Systems:   Negative for fever, visual changes, sore throat, rash, chest pain, bleeding, n/v/abdominal pain.  Positive for dysuria.   Past Medical History  Diagnosis Date  . Hypertension   . Stroke   . Anemia   . Hyperlipidemia   . Vitamin D deficiency   . Hypothyroid   . Pulmonary hypertension   . Chronic respiratory failure with hypoxia   . Blindness of right eye     Macular degeneration    Past Surgical History  Procedure Date  . Cataract extraction, bilateral     Social History:  reports that she quit smoking about 14 years ago. Her smoking use included Cigarettes. She has a 25 pack-year smoking history. She has never used smokeless tobacco. She reports that she does not drink alcohol or use illicit drugs.  Allergies  Allergen Reactions  . Lisinopril     pancreatitis    Family History  Problem Relation Age of Onset  . Heart disease Mother   . Heart disease Father   . Heart disease Brother   . Breast cancer Daughter   . Lymphoma Brother      Prior to Admission medications   Medication Sig Start Date End Date Taking? Authorizing Provider  beta carotene w/minerals (OCUVITE) tablet Take 1 tablet by mouth daily.   Yes Historical Provider, MD  calcium carbonate (OS-CAL - DOSED IN MG OF ELEMENTAL CALCIUM) 1250 MG tablet Take 1 tablet by mouth daily.   Yes Historical Provider, MD  cholecalciferol (VITAMIN D) 1000 UNITS tablet Take 1,000 Units by mouth daily.   Yes Historical Provider, MD  dipyridamole-aspirin (AGGRENOX) 25-200 MG per 12 hr capsule Take 1 capsule by mouth 2 (two) times daily.   Yes Historical Provider, MD  Ferrous Sulfate (IRON) 325 (65 FE) MG TABS Take 1 tablet by  mouth daily.   Yes Historical Provider, MD  folic acid (FOLVITE) 1 MG tablet Take 1 mg by mouth daily.   Yes Historical Provider, MD  furosemide (LASIX) 20 MG tablet Take 20 mg by mouth every other day.   Yes Historical Provider, MD  glucosamine-chondroitin 500-400 MG tablet Take 1 tablet by mouth 3  (three) times daily.   Yes Historical Provider, MD  levothyroxine (SYNTHROID, LEVOTHROID) 100 MCG tablet Take 100 mcg by mouth daily.   Yes Historical Provider, MD  methocarbamol (ROBAXIN) 500 MG tablet Take 500 mg by mouth 3 (three) times daily as needed. Muscle spasms   Yes Historical Provider, MD  metoprolol tartrate (LOPRESSOR) 12.5 mg TABS Take 0.5 tablets (12.5 mg total) by mouth 2 (two) times daily. 04/21/12  Yes Hollice Espy, MD  Multiple Vitamin (MULITIVITAMIN WITH MINERALS) TABS Take 1 tablet by mouth daily.   Yes Historical Provider, MD  omeprazole (PRILOSEC) 40 MG capsule Take 40 mg by mouth daily.   Yes Historical Provider, MD  simvastatin (ZOCOR) 40 MG tablet Take 40 mg by mouth every evening.   Yes Historical Provider, MD  traMADol (ULTRAM) 50 MG tablet Take 50 mg by mouth 3 (three) times daily. For pain relief   Yes Historical Provider, MD   Physical Exam: Filed Vitals:   05/16/12 1613 05/16/12 1628 05/16/12 1919  BP: 160/74 165/68   Pulse: 68 72   Temp:  97.2 F (36.2 C)   TempSrc:  Oral   Resp: 26 20   SpO2: 91% 100% 100%    General:  Exam in the emergency department. Appears calm and comfortable Eyes: Left pupil grossly unremarkable, normal lid, irises. Right cornea is scarred, pupil abnormal  ENT: grossly normal hearing, lips & tongue Neck: no LAD, masses or thyromegaly Breasts: Examined in the presence of RN Aritza. Left breast appears unremarkable. Right nipple appears unremarkable. The areola has a few tiny raised masses there is an irregular appearing mole at approximately 3:00. Cardiovascular: RRR, no m/r/g. No LE edema.  Respiratory: CTA on the left without wheezes, rales or rhonchi. Right chest with decreased breath sounds, no frank wheezes, rales, rhonchi. Mild increased respiratory effort. Abdomen: soft, ntnd Skin: no rash or induration seen on limited exam Musculoskeletal: grossly normal tone BUE/BLE Psychiatric: grossly normal mood and affect, speech  fluent and appropriate Neurologic: grossly non-focal.  Wt Readings from Last 3 Encounters:  04/21/12 73.6 kg (162 lb 4.1 oz)  03/01/12 73.301 kg (161 lb 9.6 oz)  08/04/11 76.204 kg (168 lb)    Labs on Admission:  Basic Metabolic Panel:  Lab 05/16/12 1610  NA 136  K 4.6  CL 91*  CO2 38*  GLUCOSE 94  BUN 28*  CREATININE 1.01  CALCIUM 10.0  MG --  PHOS --    CBC:  Lab 05/16/12 1819  WBC 6.2  NEUTROABS 4.5  HGB 11.2*  HCT 34.2*  MCV 95.3  PLT 220    Troponin (Point of Care Test)  [facility] 05/16/12 1832  TROPIPOC 0.00    BNP (last 3 results)  Basename 04/16/12 0518 04/15/12 1113  PROBNP 4011.0* 4040.0*    Radiological Exams on Admission: Dg Chest 2 View  05/16/2012  *RADIOLOGY REPORT*  Clinical Data: Short of breath  CHEST - 2 VIEW  Comparison: 04/20/2012  Findings: Normal heart size.  There is a moderate-sized right pleural effusion which is increased in volume from previous exam. Smaller left effusion is also increased from previous study. Atelectasis is noted in both  lung bases.  There is consolidation in the left lower lobe.  IMPRESSION:  1.  Interval increase in volume of bilateral pleural effusions. 2.  Bibasilar atelectasis 3.  Left lower lobe airspace consolidation.   Original Report Authenticated By: Signa Kell, M.D.     EKG: Independently reviewed.  as above    Principal Problem:  *Pleural effusion Active Problems:  Pneumonia  Chronic respiratory failure with hypoxia  Pulmonary hypertension   Assessment/Plan 1. Recurrent right pleural effusion: Admit for further evaluation and treatment. Currently nontoxic. Plan for pleuracentesis 1/3. Lasix not particularly effective last admission. 2. Possible left-sided pneumonia: Afebrile, no leukocytosis. History not particularly suggestive of pneumonia. Though the patient was recently hospitalized she appears to be quite stable and has a clear reason for her shortness of breath, therefore will treat as  community-acquired. 3. Chronic respiratory failure with hypoxia: Continue oxygen. 4. Asymmetric appearance of breast tissue: Consider mammogram as outpatient. 5. Pulmonary artery hypertension 6. Hypertension: Stable 7. Hypothyroidism: Stable 8. History of stroke: Stable. Continue Aggrenox, Zocor.  9. Dysuria: Check urinalysis, culture    Code Status: DO NOT RESUSCITATE/DO NOT INTUBATE  Family Communication:  none present  Disposition Plan/Anticipated LOS:  2-3 days  Time spent:  60  minutes  [name], MD  [facility] Team 4  Pager [phone] If 7PM-7AM, please contact night-coverage at [email or web address], password [facility] 05/16/2012, 7:38 PM

## 2012-05-17 ENCOUNTER — Encounter (HOSPITAL_COMMUNITY): Payer: Self-pay | Admitting: Cardiology

## 2012-05-17 ENCOUNTER — Inpatient Hospital Stay (HOSPITAL_COMMUNITY): Payer: Medicare Other

## 2012-05-17 DIAGNOSIS — Z8673 Personal history of transient ischemic attack (TIA), and cerebral infarction without residual deficits: Secondary | ICD-10-CM | POA: Insufficient documentation

## 2012-05-17 DIAGNOSIS — R0609 Other forms of dyspnea: Secondary | ICD-10-CM

## 2012-05-17 DIAGNOSIS — I050 Rheumatic mitral stenosis: Secondary | ICD-10-CM | POA: Diagnosis present

## 2012-05-17 DIAGNOSIS — I2510 Atherosclerotic heart disease of native coronary artery without angina pectoris: Secondary | ICD-10-CM | POA: Insufficient documentation

## 2012-05-17 DIAGNOSIS — I5033 Acute on chronic diastolic (congestive) heart failure: Secondary | ICD-10-CM | POA: Diagnosis present

## 2012-05-17 DIAGNOSIS — I10 Essential (primary) hypertension: Secondary | ICD-10-CM

## 2012-05-17 DIAGNOSIS — E785 Hyperlipidemia, unspecified: Secondary | ICD-10-CM | POA: Insufficient documentation

## 2012-05-17 LAB — URINALYSIS, ROUTINE W REFLEX MICROSCOPIC
Bilirubin Urine: NEGATIVE
Hgb urine dipstick: NEGATIVE
Nitrite: NEGATIVE
Specific Gravity, Urine: 1.012 (ref 1.005–1.030)
pH: 5.5 (ref 5.0–8.0)

## 2012-05-17 MED ORDER — FUROSEMIDE 40 MG PO TABS
40.0000 mg | ORAL_TABLET | Freq: Two times a day (BID) | ORAL | Status: DC
  Administered 2012-05-17: 40 mg via ORAL
  Filled 2012-05-17 (×4): qty 1

## 2012-05-17 MED ORDER — ENOXAPARIN SODIUM 40 MG/0.4ML ~~LOC~~ SOLN
40.0000 mg | SUBCUTANEOUS | Status: DC
  Administered 2012-05-17: 40 mg via SUBCUTANEOUS
  Filled 2012-05-17 (×2): qty 0.4

## 2012-05-17 MED ORDER — ENOXAPARIN SODIUM 30 MG/0.3ML ~~LOC~~ SOLN
30.0000 mg | SUBCUTANEOUS | Status: DC
  Filled 2012-05-17: qty 0.3

## 2012-05-17 MED ORDER — LORAZEPAM 0.5 MG PO TABS
0.5000 mg | ORAL_TABLET | ORAL | Status: AC
  Administered 2012-05-17: 0.5 mg via ORAL
  Filled 2012-05-17: qty 1

## 2012-05-17 MED ORDER — KETOROLAC TROMETHAMINE 30 MG/ML IJ SOLN
30.0000 mg | INTRAMUSCULAR | Status: AC
  Administered 2012-05-17: 30 mg via INTRAVENOUS
  Filled 2012-05-17: qty 1

## 2012-05-17 MED ORDER — OXYCODONE-ACETAMINOPHEN 5-325 MG PO TABS: 1.0000 | ORAL_TABLET | Freq: Four times a day (QID) | ORAL | Status: DC | PRN

## 2012-05-17 MED ORDER — OXYCODONE-ACETAMINOPHEN 5-325 MG PO TABS
1.0000 | ORAL_TABLET | Freq: Four times a day (QID) | ORAL | Status: DC | PRN
  Administered 2012-05-17 – 2012-05-25 (×14): 1 via ORAL
  Filled 2012-05-17 (×17): qty 1

## 2012-05-17 NOTE — Consult Note (Signed)
Cardiology Consult Note  Admit date: 05/16/2012 Name: [name] 79 y.o.  female DOB:  22-Apr-1925 MRN:  [id number]  Today's date:  05/17/2012  Referring Physician:    [facility]  Primary Physician:   Dr. [name]  Reason for Consultation:  Recurrent pleural effusion and dyspnea  IMPRESSIONS: 1. Rapidly reaccumulating pleural effusion on the right which is transudative. 2. Mild to moderate aortic stenosis and mild to moderate mitral stenosis 3. Pulmonary hypertension moderate 4. Coronary artery calcifications 5. Back pain 6. Hypertensive heart disease 7. Hypothyroidism 8. Chronic respiratory failure with hypoxemia and home oxygen 9. Hyperlipidemia 10. History of chronic diastolic heart failure  RECOMMENDATION: The patient has had a rapidly reaching delayed right pleural effusion over the past month. We had increased her diuretics as an outpatient and she had not had any dietary indiscretion, fluid indiscretion and according to her records and only gained 2 pounds since discharge. There were no intercurrent medications that would be responsible for heart failure.  At this point it is unclear since her BNP level is lower than it was on discharge whether this is simply heart failure or whether she has some concomitant pulmonary process that is causing recurrent pleural effusion on the right.  Recommendations: 1. Increase diuresis and diurese to the point where she develops significant renal insufficiency 2. Fluid restriction 3. Will follow along with you 4. If the fluid continues to accumulate despite a BNP level that is not significantly or severely elevated, may need to get pulmonary opinion as to whether there is a concomitant pulmonary process that could be responsible for this.  HISTORY: This 79 year old female has a prior history of a previous thalamic stroke and also has hypertensive heart disease. She developed some diastolic congestive heart failure in 2010 that responded  well to diuretic therapy. She was admitted in December with dyspnea and had a large right pleural effusion that was tapped. The effusion was largely transudate if. During that admission she had an echocardiogram that showed a mild/moderate aortic stenosis and mild to moderate mitral stenosis and some regurgitation. Left ventricular function was normal and she had moderate pulmonary hypertension. She was discharged home and I saw her on December 13. At that time she was taken her diuretics every other day and I asked her to take 1 daily and to weigh herself daily.  Since discharge she has not had any large gaining weight until she gained 2 pounds the day prior to coming here. She began to have significant back pain and went to see the orthopedic surgeon who noted her to have a large pleural effusion and she is readmitted to the hospital at this time. She had thoracentesis morning of over a liter of transudate of fluid again. I was asked to consult on her. She does have mild to moderate dyspnea on exertion. She has not had peripheral edema. She denies PND orthopnea and does not have anginal type pain.  Past Medical History  Diagnosis Date  . Stroke   . Anemia   . Hyperlipidemia   . Vitamin D deficiency   . Hypothyroid   . Pulmonary hypertension   . Chronic respiratory failure with hypoxia   . Blindness of right eye     Macular degeneration  . Macular degeneration   . Hypertensive heart disease 04/16/2012     Past Surgical History  Procedure Date  . Cataract extraction, bilateral     Allergies:  is allergic to lisinopril.   Medications: Prior to Admission medications  Medication Sig Start Date End Date Taking? Authorizing Provider  beta carotene w/minerals (OCUVITE) tablet Take 1 tablet by mouth daily.   Yes Historical Provider, MD  calcium carbonate (OS-CAL - DOSED IN MG OF ELEMENTAL CALCIUM) 1250 MG tablet Take 1 tablet by mouth daily.   Yes Historical Provider, MD  cholecalciferol  (VITAMIN D) 1000 UNITS tablet Take 1,000 Units by mouth daily.   Yes Historical Provider, MD  dipyridamole-aspirin (AGGRENOX) 25-200 MG per 12 hr capsule Take 1 capsule by mouth 2 (two) times daily.   Yes Historical Provider, MD  Ferrous Sulfate (IRON) 325 (65 FE) MG TABS Take 1 tablet by mouth daily.   Yes Historical Provider, MD  folic acid (FOLVITE) 1 MG tablet Take 1 mg by mouth daily.   Yes Historical Provider, MD  furosemide (LASIX) 20 MG tablet Take 20 mg by mouth every other day.   Yes Historical Provider, MD  glucosamine-chondroitin 500-400 MG tablet Take 1 tablet by mouth 3 (three) times daily.   Yes Historical Provider, MD  levothyroxine (SYNTHROID, LEVOTHROID) 100 MCG tablet Take 100 mcg by mouth daily.   Yes Historical Provider, MD  methocarbamol (ROBAXIN) 500 MG tablet Take 500 mg by mouth 3 (three) times daily as needed. Muscle spasms   Yes Historical Provider, MD  metoprolol tartrate (LOPRESSOR) 12.5 mg TABS Take 0.5 tablets (12.5 mg total) by mouth 2 (two) times daily. 04/21/12  Yes Hollice Espy, MD  Multiple Vitamin (MULITIVITAMIN WITH MINERALS) TABS Take 1 tablet by mouth daily.   Yes Historical Provider, MD  omeprazole (PRILOSEC) 40 MG capsule Take 40 mg by mouth daily.   Yes Historical Provider, MD  simvastatin (ZOCOR) 40 MG tablet Take 40 mg by mouth every evening.   Yes Historical Provider, MD  traMADol (ULTRAM) 50 MG tablet Take 50 mg by mouth 3 (three) times daily. For pain relief   Yes Historical Provider, MD    Family History: Family Status  Relation Status Death Age  . Mother Deceased     died of MI  . Father Deceased 82    heart disease  . Brother Deceased     died of MI    Social History:   reports that she quit smoking about 14 years ago. Her smoking use included Cigarettes. She has a 25 pack-year smoking history. She has never used smokeless tobacco. She reports that she does not drink alcohol or use illicit drugs.   History   Social History  Narrative  . No narrative on file    Review of Systems: She has had significant mid back pain in her upper back area, she does not have anginal type pain, her appetite has been reasonable. She has had urinary frequency as well as some nocturia. She also has chronic low back pain and has chronic dizziness and unsteadiness on her feet. Other than as noted above the remainder of the review of systems is unremarkable.  Physical Exam: BP 118/45  Pulse 77  Temp 97.1 F (36.2 C) (Oral)  Resp 16  Ht 5\' 4"  (1.626 m)  Wt 75.751 kg (167 lb)  BMI 28.67 kg/m2  SpO2 100%  General appearance: Pleasant elderly woman who is lying in bed and isn't currently in no acute distress Head: Normocephalic, without obvious abnormality, atraumatic Eyes: Right eye appears blind, conjunctiva and sclera clear on the left eye Neck: no adenopathy, no carotid bruit, no JVD and supple, symmetrical, trachea midline Lungs: Reduced breath sounds at the right base, no wheezing heard,  faint crackles at left base Heart: Regular rate and rhythm, normal S1-S2, no diastolic murmur noted, 2/6 systolic murmur at aortic area and also at the apex, Abdomen: soft, non-tender; bowel sounds normal; no masses,  no organomegaly Pelvic: deferred Extremities: Mild venous insufficiency changes noted, trace edema Pulses: 2+ and symmetric Skin: Skin color, texture, turgor normal. No rashes or lesions Neurologic: Grossly normal   Labs: CBC  Basename 05/16/12 1819  WBC 6.2  RBC 3.59*  HGB 11.2*  HCT 34.2*  PLT 220  MCV 95.3  MCH 31.2  MCHC 32.7  RDW 13.1  LYMPHSABS 0.7  MONOABS 0.8  EOSABS 0.2  BASOSABS 0.0   CMP   Basename 05/16/12 1819  NA 136  K 4.6  CL 91*  CO2 38*  GLUCOSE 94  BUN 28*  CREATININE 1.01  CALCIUM 10.0  PROT --  ALBUMIN --  AST --  ALT --  ALKPHOS --  BILITOT --  GFRNONAA 49*  GFRAA 57*   BNP (last 3 results)  Basename 05/17/12 1334 04/16/12 0518 04/15/12 1113  PROBNP 1423.0* 4011.0*  4040.0*    Radiology: Admission large right pleural effusion, significant reduction in size of effusion on followup x-ray with small left effusion noted.  EKG: Sinus rhythm with PACs, some block, either PVC or aberrancy also noted, no ischemic changes  Signed:  W. Ashley Royalty MD [facility]   Cardiology Consultant  05/17/2012, 5:31 PM

## 2012-05-17 NOTE — Progress Notes (Unsigned)
Patient ID: [name], female   DOB: [birth year], 79 y.o.   MRN: [id number] Jillian, Clay    Date of visit:  04/26/2012 DOB:  September 05, 1924    Age:  79 yrs. Medical record number:  [id number]     Account number:  [id number] Primary Care Provider: [name] ____________________________ CURRENT DIAGNOSES  1. Congestive Heart Failure Chronic Diastolic  2. Dyspnea  3. Hypertensive Heart Disease-Benign without CHF  4. Mitral Valve Disorder  5. Hyperlipidemia  6. Hypothyroidism  7. Personal history of TIA or stroke without residua ____________________________ ALLERGIES  lisinopril, Pancretitis ____________________________ MEDICATIONS  1. Aggrenox 200-25 mg Cap, Multiphasic Release 12 hr, BID  2. simvastatin 40 mg Tablet, 1 p.o. daily  3. levothyroxine 100 mcg Tablet, 1 p.o. daily  4. Multi-Vitamin Tab, 1 p.o. daily  5. folic acid 400 mcg Tablet, 1 p.o. daily  6. metoprolol succinate 25 mg tablet extended release 24 hr, 1/2 tab b.i.d.  7. omeprazole 40 mg capsule,delayed release(DR/EC), 1 p.o. daily  8. tramadol 50 mg tablet, bid  9. Vitamin D3 1,000 unit tablet, 1 p.o. daily  10. Iron (ferrous sulfate) 325 mg (65 mg iron) tablet, 1 p.o. daily  11. Glucosamine-Chond-MSM Complex 375-500-15-0.5 mg tablet, BID  12. methocarbamol 500 mg tablet, PRN  13. Ocuvite 150-30-6-150 mg-unit-mg-mg capsule, BID  14. Calcium 600 + D(3) 600-125 mg-unit tablet, 1 p.o. daily  15. furosemide 20 mg tablet, 1 p.o. daily ____________________________ CHIEF COMPLAINTS  Followup of Dyspnea ____________________________ HISTORY OF PRESENT ILLNESS  Patient seen after a two-year absence for followup of diastolic congestive heart failure. She continues to be limited with orthopedic problems. She recently developed worsening dyspnea as well as weight gain and was eventually hospitalized recently at [facility] with severe dyspnea and cough. She was found to have a large right pleural effusion that was eventually  tapped and she was diuresed. Her BNP level was elevated. An echocardiogram done that admission showed mild to moderate mitral stenosis as well as regurgitation and evidence of mild pulmonary hypertension. She was discharged about 5 days ago. She was still hypoxemic and has been on oxygen. She is not currently having edema and her daughter is now staying with her as her husband is in a rehabilitation facility. She had evidence also of some coronary calcification seen on a CT angiogram. She currently has mild dyspnea with exertion. She has no anginal pain. She has no PND or orthopnea but does have dyspnea on moderate exertion. ____________________________ PAST HISTORY  Past Medical Illnesses:  hypertension, hyperlipidemia, hypothyroidism, obesity, CVA without deficits, chronic anemia, macular degeneration;  Cardiovascular Illnesses:  diastolic CHF;  Surgical Procedures:  no prior surgical procedures;  Cardiology Procedures-Invasive:  no previous interventional or invasive cardiology procedures;  Cardiology Procedures-Noninvasive:  echocardiogram December 2013;  LVEF of 55% documented via echocardiogram on 04/16/2012 ____________________________ CARDIO-PULMONARY TEST DATES EKG Date:  04/26/2012;  Echocardiography Date: 04/16/2012;  Chest Xray Date: 04/20/2012;   ____________________________ SOCIAL HISTORY Alcohol Use:  does not use alcohol;  Smoking:  nonsmoker;  Diet:  regular diet;  Lifestyle:  married and 3 daughters;  Exercise:  exercise is limited due to physical disability;  Residence:  lives with daughter;   ____________________________ REVIEW OF SYSTEMS General:  malaise and fatigue  Integumentary:  easy bruisability Eyes:  decreased acuity, vision loss OD, macular degeneration Ears, Nose, Throat, Mouth:  partial hearing loss  Respiratory:  mild dyspnea with exertion  Cardiovascular:  please review HPI  Genitourinary-Female:  frequency, nocturia, recurrent urinary tract  infections  Musculoskeletal:   arthritis of the knees  Neurological:  gait disturbance ____________________________ PHYSICAL EXAMINATION VITAL SIGNS  Blood Pressure:  110/60 Sitting, Right arm, regular cuff  , 114/62 Standing, Right arm and regular cuff   Pulse:  68/min. Weight:  167.00 lbs. Height:  64"BMI: 28  Constitutional:  pleasant white female, in no acute distress walks with cane Skin:  warm and dry to touch, no apparent skin lesions, or masses noted. Head:  normocephalic, normal hair pattern, no masses or tenderness Eyes:  right eye cloudy, funduscopic exam not performed ENT:  ears, nose and throat reveal no gross abnormalities.  Dentition good. Neck:  supple, without massess. No JVD, thyromegaly or carotid bruits. Carotid upstroke normal. Chest:  normal symmetry, clear to auscultation and percussion. Cardiac:  regular rhythm, normal S1 and S2, No S3 or S4, 2/6 systolic murmur at aortic area as well as apex Abdomen:  abdomen soft,non-tender, no masses, no hepatospenomegaly, or aneurysm noted Peripheral Pulses:  the femoral,dorsalis pedis, and posterior tibial pulses are full and equal bilaterally with no bruits auscultated. Extremities & Back: mild venous insufficiency, trace edema Neurological: somewhat unsteady gait ____________________________ MOST RECENT LIPID PANEL 07/17/11  CHOL TOTL 191 mg/dl, LDL 409 calc, HDL 73 mg/dl, TRIGLYCER 57 mg/dl and CHOL/HDL 2.6 (Calc) ____________________________ IMPRESSIONS/PLAN  1. Acute exacerbation of chronic diastolic heart failure 2. Recent pleural effusion presumed transudative  3. Hypertensive heart disease 4. Prior history of TIA 5. Hyperlipidemia under treatment 6. Coronary artery disease with coronary calcifications. 7. Mild/moderate mitral stenosis and regurgitation  Recommendations:  I recommended that she increase her furosemide to 20 mg daily. She should weigh daily and if she continues to have weight gain she may need to take a higher dose of  furosemide. I will see her in followup in one month. EKG shows her to be in sinus rhythm with occasional PVCs no acute changes. ____________________________ TODAYS ORDERS  1. 12 Lead EKG: Today  2. Return Visit: 1 month                       ____________________________ Cardiology Physician:  Darden Palmer MD [facility]

## 2012-05-17 NOTE — Progress Notes (Signed)
ANTIBIOTIC CONSULT NOTE   Pharmacy Consult for Pharmacy to renally adjust antibiotic Indication: pneumonia  Allergies  Allergen Reactions  . Lisinopril     pancreatitis    Patient Measurements: Height: 5\' 4"  (162.6 cm) Weight: 167 lb (75.751 kg) IBW/kg (Calculated) : 54.7  Adjusted Body Weight:   Vital Signs: Temp: 97.5 F (36.4 C) (01/03 0614) Temp src: Oral (01/03 0614) BP: 118/67 mmHg (01/03 0614) Pulse Rate: 71  (01/03 0614) Intake/Output from previous day: 01/02 0701 - 01/03 0700 In: -  Out: 250 [Urine:250] Intake/Output from this shift: Total I/O In: 360 [P.O.:360] Out: -   Labs:  Basename 05/16/12 1819  WBC 6.2  HGB 11.2*  PLT 220  LABCREA --  CREATININE 1.01   Estimated Creatinine Clearance: 40.6 ml/min (by C-G formula based on Cr of 1.01). No results found for this basename: VANCOTROUGH:2,VANCOPEAK:2,VANCORANDOM:2,GENTTROUGH:2,GENTPEAK:2,GENTRANDOM:2,TOBRATROUGH:2,TOBRAPEAK:2,TOBRARND:2,AMIKACINPEAK:2,AMIKACINTROU:2,AMIKACIN:2, in the last 72 hours   Microbiology: Recent Results (from the past 720 hour(s))  BODY FLUID CULTURE     Status: Normal   Collection Time   04/18/12  2:00 PM      Component Value Range Status Comment   Specimen Description PLEURAL   Final    Special Requests Normal   Final    Gram Stain     Final    Value: RARE WBC PRESENT, PREDOMINANTLY MONONUCLEAR     NO ORGANISMS SEEN   Culture NO GROWTH 3 DAYS   Final    Report Status 04/21/2012 FINAL   Final     Anti-infectives     Start     Dose/Rate Route Frequency Ordered Stop   05/16/12 2359   levofloxacin (LEVAQUIN) IVPB 750 mg  Status:  Discontinued        750 mg 100 mL/hr over 90 Minutes Intravenous Every 24 hours 05/16/12 2214 05/16/12 2237   05/16/12 2359   levofloxacin (LEVAQUIN) IVPB 750 mg        750 mg 100 mL/hr over 90 Minutes Intravenous Every 48 hours 05/16/12 2238 05/22/12 2359   05/16/12 2000  piperacillin-tazobactam (ZOSYN) IVPB 3.375 g       3.375 g 12.5  mL/hr over 240 Minutes Intravenous  Once 05/16/12 1931 05/17/12 0042          Assessment: 87 YOF presents with SOB from recurrent pleural effusion and ? PNA.   1/3 >> Levaquin   >> 1/2 >> Zosyn x 1 dose    Tmax: afeb WBCs: 6.2 Renal: Scr = 1 for est CrCl = 66ml/min  / urine: ordered / sputum: ordered  Dose changes/drug level info: none  Goal of Therapy:  Resolution of infection  Plan:  - Levofloxacin adjusted to 750mg  IV q48h, cultures ordered. F/u ability to change to PO  Dannielle Huh 05/17/2012,8:30 AM

## 2012-05-17 NOTE — Progress Notes (Signed)
Patient back from thoracentesis.

## 2012-05-17 NOTE — Progress Notes (Signed)
[facility] PROGRESS NOTE  [name] MVH:[id number] DOB: [birth year] DOA: 05/16/2012 PCP: [name], MD    Brief narrative: 79 year old female with history off hypertension, stroke, pulmonary hypertension close admitted one month back with respiratory failure with a large right transudate pleural effusion which needs thoracentesis and discharged home on home oxygen returns with dyspnea on exertion and orthopnea with recurrent right pleural effusion.     Assessment/Plan:  recurrent right pleural effusion Patient was admitted to medical floor. She had a right thoracentesis done this morning with 1.4 L fluid removed. Follow up chest x-ray shows interval reduction in right-sided pleural effusion with a small residual pleural fluid remaining. She had about 4 L of transudative fluid removed on last admission. She has been placed on home dose of Lasix as high dose of Lasix was not much effective in improving her shortness of breath on last admission. A 2-D echo obtained at that time showed EF of 55% with moderate mitral regurgitation and pulmonary artery hypertension. She was evaluated by pulmonary and thought her symptoms to be related to underlying restrictive lung disease and left greater than right heart failure. -She was discharged home on home O2 with plan to follow up with Dr. Donnie Aho as outpatient which he hasn't done yet. -I have ordered for a repeat proBNP level. She's not in distress  at present. I have requested her cardiologist Dr. Donnie Aho for the consult. -Monitor daily weights and I&Os.  Possible left-sided pneumonia Patient was started on empiric antibiotic. However she does not have any fever and x-ray not clearly suggestive of an infiltrate. I will discontinue antibiotics.  Hypertension Stable.  continue beta blocker  Hypothyroidism Stable.  continue Synthroid.  History of stroke Continue Aggrenox and Zocor     Code Status: DO NOT RESUSCITATE Family Communication:  None at bedside Disposition Plan: Home once Stable   Consultants:  Dr. Donnie Aho has been consulted  Procedures:  Rt thoracentesis on 1/3  Antibiotics:  none  HPI/Subjective: Patient seen and examined this morning complains of low back pain and some shortness of breath at rest. She had thoracentesis done with about 1.4 L of fluid removed and was aborted as she continuously started to cough. Feels better off her procedure  Objective: Filed Vitals:   05/17/12 0614 05/17/12 0900 05/17/12 1106 05/17/12 1119  BP: 118/67 150/72 139/69 142/59  Pulse: 71     Temp: 97.5 F (36.4 C)     TempSrc: Oral     Resp: 20     Height:      Weight:      SpO2: 98% 96% 100% 100%    Intake/Output Summary (Last 24 hours) at 05/17/12 1149 Last data filed at 05/17/12 0820  Gross per 24 hour  Intake    360 ml  Output    250 ml  Net    110 ml   Filed Weights   05/16/12 2229  Weight: 75.751 kg (167 lb)    Exam:   General: Elderly female lying in bed in no acute distress  Cardiovascular: Normal S1 and S2, no murmurs  Respiratory: Diminished breath sounds over right side. normal breath sounds over left lung  Abdomen: Soft, nontender, nondistended, bowel sounds present  Extremities: Warm, no edema  CNS: AAO X3  Data Reviewed: Basic Metabolic Panel:  Lab 05/16/12 8413  NA 136  K 4.6  CL 91*  CO2 38*  GLUCOSE 94  BUN 28*  CREATININE 1.01  CALCIUM 10.0  MG --  PHOS --  Liver Function Tests: No results found for this basename: AST:5,ALT:5,ALKPHOS:5,BILITOT:5,PROT:5,ALBUMIN:5 in the last 168 hours No results found for this basename: LIPASE:5,AMYLASE:5 in the last 168 hours No results found for this basename: AMMONIA:5 in the last 168 hours CBC:  Lab 05/16/12 1819  WBC 6.2  NEUTROABS 4.5  HGB 11.2*  HCT 34.2*  MCV 95.3  PLT 220   Cardiac Enzymes: No results found for this basename: CKTOTAL:5,CKMB:5,CKMBINDEX:5,TROPONINI:5 in the last 168 hours BNP (last 3  results)  Basename 04/16/12 0518 04/15/12 1113  PROBNP 4011.0* 4040.0*   CBG: No results found for this basename: GLUCAP:5 in the last 168 hours  No results found for this or any previous visit (from the past 240 hour(s)).   Studies: Dg Chest 1 View  05/17/2012  *RADIOLOGY REPORT*  Clinical Data: Post right-sided thoracentesis  CHEST - 1 VIEW  Comparison: 05/16/2012; 04/20/2012; chest CT - 04/15/2012  Findings:  Interval reduction and right-sided pleural effusion post thoracentesis.  No definite pneumothorax.  Grossly unchanged cardiac silhouette and mediastinal contours.  Small bilateral pleural effusions remain.  Grossly unchanged bibasilar heterogeneous opacities.  No new focal airspace opacity.  Grossly unchanged bones.  IMPRESSION: 1.  Interval reduction in now small residual right-sided pleural effusion post thoracentesis.  No pneumothorax. 2.  Grossly unchanged small left-sided effusion and bibasilar opacities, likely atelectasis.   Original Report Authenticated By: Tacey Ruiz, MD    Dg Chest 2 View  05/16/2012  *RADIOLOGY REPORT*  Clinical Data: Short of breath  CHEST - 2 VIEW  Comparison: 04/20/2012  Findings: Normal heart size.  There is a moderate-sized right pleural effusion which is increased in volume from previous exam. Smaller left effusion is also increased from previous study. Atelectasis is noted in both lung bases.  There is consolidation in the left lower lobe.  IMPRESSION:  1.  Interval increase in volume of bilateral pleural effusions. 2.  Bibasilar atelectasis 3.  Left lower lobe airspace consolidation.   Original Report Authenticated By: Signa Kell, M.D.     Scheduled Meds:   . dipyridamole-aspirin  1 capsule Oral BID  . furosemide  20 mg Oral Daily  . levofloxacin (LEVAQUIN) IV  750 mg Intravenous Q48H  . levothyroxine  100 mcg Oral Daily  . metoprolol tartrate  12.5 mg Oral BID  . pantoprazole  40 mg Oral Daily  . simvastatin  40 mg Oral Q2200  . sodium  chloride  3 mL Intravenous Q12H   Continuous Infusions:      Time spent: 25 minutes    Imraan Wendell  [facility] Pager [phone]. If 8PM-8AM, please contact night-coverage at [email or web address], password [facility] 05/17/2012, 11:49 AM  LOS: 1 day

## 2012-05-17 NOTE — Procedures (Signed)
US guided therapeutic right thoracentesis performed yielding 1.3 liters yellow fluid. F/u CXR pending. No immediate complications. Only the above amount of fluid was removed at this time secondary to increased coughing.

## 2012-05-18 DIAGNOSIS — I5032 Chronic diastolic (congestive) heart failure: Secondary | ICD-10-CM | POA: Insufficient documentation

## 2012-05-18 DIAGNOSIS — I059 Rheumatic mitral valve disease, unspecified: Secondary | ICD-10-CM

## 2012-05-18 DIAGNOSIS — I340 Nonrheumatic mitral (valve) insufficiency: Secondary | ICD-10-CM | POA: Diagnosis present

## 2012-05-18 DIAGNOSIS — I5033 Acute on chronic diastolic (congestive) heart failure: Secondary | ICD-10-CM

## 2012-05-18 DIAGNOSIS — I509 Heart failure, unspecified: Secondary | ICD-10-CM

## 2012-05-18 LAB — COMPREHENSIVE METABOLIC PANEL
ALT: 9 U/L (ref 0–35)
AST: 20 U/L (ref 0–37)
Alkaline Phosphatase: 62 U/L (ref 39–117)
CO2: 41 mEq/L (ref 19–32)
Calcium: 9.2 mg/dL (ref 8.4–10.5)
GFR calc Af Amer: 36 mL/min — ABNORMAL LOW (ref >90)
Glucose, Bld: 81 mg/dL (ref 70–99)
Potassium: 4.5 mEq/L (ref 3.5–5.1)
Sodium: 138 mEq/L (ref 135–145)
Total Protein: 6.3 g/dL (ref 6.0–8.3)

## 2012-05-18 LAB — BASIC METABOLIC PANEL
CO2: 40 mEq/L (ref 19–32)
Chloride: 92 mEq/L — ABNORMAL LOW (ref 96–112)
Creatinine, Ser: 1.86 mg/dL — ABNORMAL HIGH (ref 0.50–1.10)
GFR calc Af Amer: 27 mL/min — ABNORMAL LOW (ref >90)
Potassium: 4.5 mEq/L (ref 3.5–5.1)

## 2012-05-18 LAB — INFLUENZA PANEL BY PCR (TYPE A & B)
H1N1 flu by pcr: NOT DETECTED
Influenza B By PCR: NEGATIVE

## 2012-05-18 MED ORDER — FUROSEMIDE 10 MG/ML IJ SOLN
20.0000 mg | Freq: Once | INTRAMUSCULAR | Status: AC
  Administered 2012-05-18: 20 mg via INTRAVENOUS
  Filled 2012-05-18: qty 2

## 2012-05-18 MED ORDER — OXYCODONE-ACETAMINOPHEN 5-325 MG PO TABS
1.0000 | ORAL_TABLET | Freq: Once | ORAL | Status: AC
  Administered 2012-05-18: 1 via ORAL

## 2012-05-18 MED ORDER — LIDOCAINE 5 % EX PTCH
1.0000 | MEDICATED_PATCH | CUTANEOUS | Status: DC
  Administered 2012-05-18 – 2012-05-24 (×8): 1 via TRANSDERMAL
  Filled 2012-05-18 (×9): qty 1

## 2012-05-18 MED ORDER — ENOXAPARIN SODIUM 30 MG/0.3ML ~~LOC~~ SOLN
30.0000 mg | SUBCUTANEOUS | Status: DC
  Administered 2012-05-18 – 2012-05-20 (×3): 30 mg via SUBCUTANEOUS
  Filled 2012-05-18 (×3): qty 0.3

## 2012-05-18 MED ORDER — CAPSAICIN 0.025 % EX CREA
TOPICAL_CREAM | Freq: Two times a day (BID) | CUTANEOUS | Status: DC
  Administered 2012-05-18 – 2012-05-25 (×14): via TOPICAL
  Filled 2012-05-18: qty 56.6

## 2012-05-18 MED ORDER — GABAPENTIN 100 MG PO CAPS
100.0000 mg | ORAL_CAPSULE | Freq: Three times a day (TID) | ORAL | Status: DC
  Administered 2012-05-18 – 2012-05-25 (×21): 100 mg via ORAL
  Filled 2012-05-18 (×23): qty 1

## 2012-05-18 MED ORDER — FUROSEMIDE 10 MG/ML IJ SOLN
40.0000 mg | Freq: Two times a day (BID) | INTRAMUSCULAR | Status: DC
  Administered 2012-05-18 – 2012-05-21 (×7): 40 mg via INTRAVENOUS
  Filled 2012-05-18 (×11): qty 4

## 2012-05-18 NOTE — Progress Notes (Signed)
Subjective:  Still c/o pain in her back.  Not SOB today. I reviewed the old ECHO and she has moderate mitral stenosis and severe mitral regurgitation  Objective:  Vital Signs in the last 24 hours: BP 116/68  Pulse 79  Temp 97.6 F (36.4 C) (Oral)  Resp 18  Ht 5\' 4"  (1.626 m)  Wt 76.204 kg (168 lb)  BMI 28.84 kg/m2  SpO2 99%  Physical Exam: Pleasant WF, hard of hearing Lungs:  Rales right base Cardiac:  Regular rhythm, normal S1 and S2, no S3, 2/6 systolic murmur Abdomen:  Soft, nontender, no masses Extremities:  No edema present  Intake/Output from previous day: 01/03 0701 - 01/04 0700 In: 520 [P.O.:360; I.V.:160] Out: 400 [Urine:400] Weight Filed Weights   05/16/12 2229 05/18/12 0600  Weight: 75.751 kg (167 lb) 76.204 kg (168 lb)    Lab Results: Basic Metabolic Panel:  Basename 05/18/12 0516 05/16/12 1819  NA 138 136  K 4.5 4.6  CL 91* 91*  CO2 41* 38*  GLUCOSE 81 94  BUN 30* 28*  CREATININE 1.49* 1.01    CBC:  Basename 05/16/12 1819  WBC 6.2  NEUTROABS 4.5  HGB 11.2*  HCT 34.2*  MCV 95.3  PLT 220    BNP    Component Value Date/Time   PROBNP 1423.0* 05/17/2012 1334    Telemetry:   Assessment/Plan: 1. Acute on chronic diastolic CHF 2. Significant mitral valve disease 3. Recurrent pleural effusion 4. Worsening renal failure  Rec:  Weight is up.  I changed her to IV diuretics to try to get some weight off.  Willl need to watch daily weights as well as renal function.  At this point, it is not clear that the rapidly accumulating effusion is all cardiac especially with the reduction in BNP from discharge.  Darden Palmer  MD [facility]  05/18/2012, 7:47 AM

## 2012-05-18 NOTE — Progress Notes (Signed)
[facility] PROGRESS NOTE  CHAQUETTA SCHLOTTMAN JYN:[id number] DOB: 1925-03-03 DOA: 05/16/2012 PCP: [name], MD   Brief narrative:  79 year old female with history off hypertension, stroke, pulmonary hypertension close admitted one month back with respiratory failure with a large right transudate pleural effusion which needs thoracentesis and discharged home on home oxygen returns with dyspnea on exertion and orthopnea with recurrent right pleural effusion.   Assessment/Plan:  recurrent right pleural effusion  - patient  had a right thoracentesis done on 1/3  with 1.4 L fluid removed. Follow up chest x-ray shows interval reduction in right-sided pleural effusion with a small residual pleural fluid remaining. She had about 4 L of transudative fluid removed during last admission. She has been placed on home dose of Lasix as high dose of Lasix was not much effective in improving her shortness of breath on last admission. A 2-D echo obtained at that time showed EF of 55% with moderate mitral regurgitation and pulmonary artery hypertension. She was evaluated by pulmonary and thought her symptoms to be related to underlying restrictive lung disease and left greater than right heart failure.  -She was discharged home on home O2 with plan to follow up with Dr. Donnie Aho . -Repeat proBNP of 1400. Seen by Dr. Nelda Severe and evaluated the 2-D echo which she recommends shows severe mitral regurgitation. -Lasix switch to IV to increase diuresis. -Monitor daily weights and I&Os.  -If symptoms not improved with increasing dose of Lasix and fluid accumulates further will request pulmonary to evaluate.   Acute kidney injury Elevated creatinine noted in a.m. labs likely in the setting off aggressive diuresis. Will repeat a BMP in the evening. Elevate CO2 in the setting of lasix as well. Will monitor    Hypertension  Stable. continue beta blocker   Hypothyroidism  Stable. continue Synthroid.   History of stroke    Continue Aggrenox and Zocor  Code Status: DO NOT RESUSCITATE  Family Communication: None at bedside  Disposition Plan: Home once Stable   Consultants:  Dr Donnie Aho  Procedures:  Rt thoracentesis with 1.4 L fluid removed  Antibiotics:  none  HPI/Subjective: Patient is  not able to tell how much her SOB has improved after thoracentesis as she has not been out of bed . Denies back pain at this time.  Objective: Filed Vitals:   05/17/12 1500 05/17/12 2200 05/17/12 2228 05/18/12 0600  BP: 118/45 133/70 133/70 116/68  Pulse: 77 77 77 79  Temp: 97.1 F (36.2 C) 97.7 F (36.5 C)  97.6 F (36.4 C)  TempSrc: Oral Oral  Oral  Resp: 16 18  18   Height:      Weight:    76.204 kg (168 lb)  SpO2: 100% 97%  99%    Intake/Output Summary (Last 24 hours) at 05/18/12 0916 Last data filed at 05/18/12 0843  Gross per 24 hour  Intake    160 ml  Output    700 ml  Net   -540 ml   Filed Weights   05/16/12 2229 05/18/12 0600  Weight: 75.751 kg (167 lb) 76.204 kg (168 lb)    Exam:  General: Elderly female lying in bed in no acute distress  Cardiovascular: Normal S1 and S2, no murmurs  Respiratory: improved  breath sounds over right side. normal breath sounds over left lung  Abdomen: Soft, nontender, nondistended, bowel sounds present  Extremities: Warm, no edema  CNS: AAO X3   Data Reviewed: Basic Metabolic Panel:  Lab 05/18/12 8657 05/16/12 1819  NA 138  136  K 4.5 4.6  CL 91* 91*  CO2 41* 38*  GLUCOSE 81 94  BUN 30* 28*  CREATININE 1.49* 1.01  CALCIUM 9.2 10.0  MG -- --  PHOS -- --   Liver Function Tests:  Lab 05/18/12 0516  AST 20  ALT 9  ALKPHOS 62  BILITOT 0.2*  PROT 6.3  ALBUMIN 2.7*   No results found for this basename: LIPASE:5,AMYLASE:5 in the last 168 hours No results found for this basename: AMMONIA:5 in the last 168 hours CBC:  Lab 05/16/12 1819  WBC 6.2  NEUTROABS 4.5  HGB 11.2*  HCT 34.2*  MCV 95.3  PLT 220   Cardiac Enzymes: No results  found for this basename: CKTOTAL:5,CKMB:5,CKMBINDEX:5,TROPONINI:5 in the last 168 hours BNP (last 3 results)  Basename 05/17/12 1334 04/16/12 0518 04/15/12 1113  PROBNP 1423.0* 4011.0* 4040.0*   CBG: No results found for this basename: GLUCAP:5 in the last 168 hours  No results found for this or any previous visit (from the past 240 hour(s)).   Studies: Dg Chest 1 View  05/17/2012  *RADIOLOGY REPORT*  Clinical Data: Post right-sided thoracentesis  CHEST - 1 VIEW  Comparison: 05/16/2012; 04/20/2012; chest CT - 04/15/2012  Findings:  Interval reduction and right-sided pleural effusion post thoracentesis.  No definite pneumothorax.  Grossly unchanged cardiac silhouette and mediastinal contours.  Small bilateral pleural effusions remain.  Grossly unchanged bibasilar heterogeneous opacities.  No new focal airspace opacity.  Grossly unchanged bones.  IMPRESSION: 1.  Interval reduction in now small residual right-sided pleural effusion post thoracentesis.  No pneumothorax. 2.  Grossly unchanged small left-sided effusion and bibasilar opacities, likely atelectasis.   Original Report Authenticated By: Tacey Ruiz, MD    Dg Chest 2 View  05/16/2012  *RADIOLOGY REPORT*  Clinical Data: Short of breath  CHEST - 2 VIEW  Comparison: 04/20/2012  Findings: Normal heart size.  There is a moderate-sized right pleural effusion which is increased in volume from previous exam. Smaller left effusion is also increased from previous study. Atelectasis is noted in both lung bases.  There is consolidation in the left lower lobe.  IMPRESSION:  1.  Interval increase in volume of bilateral pleural effusions. 2.  Bibasilar atelectasis 3.  Left lower lobe airspace consolidation.   Original Report Authenticated By: Signa Kell, M.D.    US Thoracentesis Asp Pleural Space W/img Guide  05/17/2012  *RADIOLOGY REPORT*  Clinical Data:  Recurrent bilateral pleural effusions, right greater than left; pulmonary artery hypertension;  dyspnea.  Request is made for therapeutic right thoracentesis.  ULTRASOUND GUIDED THERAPEUTIC  RIGHT THORACENTESIS  An ultrasound guided thoracentesis was thoroughly discussed with the patient and questions answered.  The benefits, risks, alternatives and complications were also discussed.  The patient understands and wishes to proceed with the procedure.  Written consent was obtained.  Ultrasound was performed to localize and mark an adequate pocket of fluid in the right chest.  The area was then prepped and draped in the normal sterile fashion.  1% Lidocaine was used for local anesthesia.  Under ultrasound guidance a 19 gauge Yueh catheter was introduced.  Thoracentesis was performed.  The catheter was removed and a dressing applied.  Complications:  none  Findings: A total of approximately 1.3 liters of yellow fluid was removed. Only the above amount of fluid was removed at this time secondary to increased patient coughing.  IMPRESSION: Successful ultrasound guided therapeutic  right thoracentesis yielding 1.3 liters of pleural fluid.  Read by: Caryn Bee  Allred, P.A.-C   Original Report Authenticated By: Irish Lack, M.D.     Scheduled Meds:   . dipyridamole-aspirin  1 capsule Oral BID  . enoxaparin (LOVENOX) injection  40 mg Subcutaneous Q24H  . furosemide  40 mg Intravenous BID  . levothyroxine  100 mcg Oral Daily  . metoprolol tartrate  12.5 mg Oral BID  . pantoprazole  40 mg Oral Daily  . simvastatin  40 mg Oral Q2200  . sodium chloride  3 mL Intravenous Q12H   Continuous Infusions:     Time spent: 25 minutes    Lataria Courser  [facility] Pager [phone] If 8PM-8AM, please contact night-coverage at [email or web address], password [facility] 05/18/2012, 9:16 AM  LOS: 2 days

## 2012-05-18 NOTE — Progress Notes (Signed)
Patient states her vision is coming back in her left eye at this point.  Pain is somewhat better especially when she presses on the area that hurts.  Nurse tech notified nurse that BP is low at 71/41 by dinamapp, requested a manual BP which is largely the same.  Will notify MD.

## 2012-05-18 NOTE — Progress Notes (Signed)
Patient's BP is now 86/42 after bolus.  Will notify MD.

## 2012-05-18 NOTE — Progress Notes (Signed)
Patient c/o severe pain in right lower back and states she has completely lost vision in her left eye at this time.  Questioned patient regarding this and she states this has never happened before, all she can see is a bright light.  I notified MD, orders received.

## 2012-05-19 DIAGNOSIS — M549 Dorsalgia, unspecified: Secondary | ICD-10-CM | POA: Diagnosis present

## 2012-05-19 DIAGNOSIS — N179 Acute kidney failure, unspecified: Secondary | ICD-10-CM

## 2012-05-19 DIAGNOSIS — M546 Pain in thoracic spine: Secondary | ICD-10-CM

## 2012-05-19 LAB — BASIC METABOLIC PANEL
BUN: 42 mg/dL — ABNORMAL HIGH (ref 6–23)
CO2: 40 mEq/L (ref 19–32)
CO2: 41 mEq/L (ref 19–32)
Calcium: 8.8 mg/dL (ref 8.4–10.5)
Chloride: 92 mEq/L — ABNORMAL LOW (ref 96–112)
Chloride: 91 mEq/L — ABNORMAL LOW (ref 96–112)
GFR calc non Af Amer: 28 mL/min — ABNORMAL LOW (ref >90)
Glucose, Bld: 97 mg/dL (ref 70–99)
Glucose, Bld: 158 mg/dL — ABNORMAL HIGH (ref 70–99)
Potassium: 3.9 mEq/L (ref 3.5–5.1)
Potassium: 4.1 mEq/L (ref 3.5–5.1)
Sodium: 138 mEq/L (ref 135–145)
Sodium: 137 mEq/L (ref 135–145)

## 2012-05-19 LAB — URINE CULTURE: Culture: NO GROWTH

## 2012-05-19 LAB — URINALYSIS, ROUTINE W REFLEX MICROSCOPIC
Bilirubin Urine: NEGATIVE
Hgb urine dipstick: NEGATIVE
Specific Gravity, Urine: 1.01 (ref 1.005–1.030)
Urobilinogen, UA: 0.2 mg/dL (ref 0.0–1.0)
pH: 6 (ref 5.0–8.0)

## 2012-05-19 NOTE — Progress Notes (Signed)
Bladder scan performed after patient voided 450 ccs of yellow urine, no residual found.  Notified MD.

## 2012-05-19 NOTE — Progress Notes (Signed)
Subjective:  She had an episode yesterday where she c/o that when she closed her eyes that she saw a light in her left eye and lost vision.  Nurse reported Bp was low.  Given bolus.Had severe back pain at the time. Was given pain meds last night and slept better.  Feels better today.  Objective:  Vital Signs in the last 24 hours: BP 98/60  Pulse 79  Temp 97.5 F (36.4 C) (Oral)  Resp 18  Ht 5\' 4"  (1.626 m)  Wt 77.111 kg (170 lb)  BMI 29.18 kg/m2  SpO2 97%  Physical Exam: Pleasant WF, hard of hearing Lungs:  Rales right base Cardiac:  Regular rhythm, normal S1 and S2, no S3, 2/6 systolic murmur Abdomen:  Soft, nontender, no masses Extremities:  No edema present  Intake/Output from previous day: 01/04 0701 - 01/05 0700 In: -  Out: 500 [Urine:500] Weight Filed Weights   05/16/12 2229 05/18/12 0600 05/19/12 0600  Weight: 75.751 kg (167 lb) 76.204 kg (168 lb) 77.111 kg (170 lb)    Lab Results: Basic Metabolic Panel:  Basename 05/19/12 0525 05/18/12 1634  NA 138 136  K 3.9 4.5  CL 92* 92*  CO2 40* 40*  GLUCOSE 97 169*  BUN 40* 35*  CREATININE 1.89* 1.86*    CBC:  Basename 05/16/12 1819  WBC 6.2  NEUTROABS 4.5  HGB 11.2*  HCT 34.2*  MCV 95.3  PLT 220    BNP    Component Value Date/Time   PROBNP 1423.0* 05/17/2012 1334    Assessment/Plan: 1. Acute on chronic diastolic CHF 2. Significant mitral valve disease 3. Recurrent pleural effusion 4. Worsening renal failure 5. Episode of visual loss in setting of severe back pain  Rec:  Weight is up. Unclear if the weights are accurate or not.  Her BUN and Creatinine are rising with diuresis.  He effusion may be from something other than simple CHF.  I will recheck a BNP.  For now continue IV Lasix, good pain control, and follow renal function.  Darden Palmer  MD [facility]  05/19/2012, 8:09 AM

## 2012-05-19 NOTE — Progress Notes (Signed)
[facility] PROGRESS NOTE  TAKIERA MAYO HQI:[id number] DOB: 03-Dec-1924 DOA: 05/16/2012 PCP: [name], MD  Brief narrative:  79 year old female with history off hypertension, stroke, pulmonary hypertension close admitted one month back with respiratory failure with a large right transudate pleural effusion which needs thoracentesis and discharged home on home oxygen returns with dyspnea on exertion and orthopnea with recurrent right pleural effusion.   Assessment/Plan:  recurrent right pleural effusion  - patient had a right thoracentesis done on 1/3 with 1.4 L fluid removed. Follow up chest x-ray shows interval reduction in right-sided pleural effusion with a small residual pleural fluid remaining. She had about 4 L of transudative fluid removed during last admission. She has been placed on home dose of Lasix as high dose of Lasix was not much effective in improving her shortness of breath on last admission. A 2-D echo obtained at that time showed EF of 55% with moderate mitral regurgitation and pulmonary artery hypertension. She was evaluated by pulmonary and thought her symptoms to be related to underlying restrictive lung disease and left greater than right heart failure.  -She was discharged home on home O2 with plan to follow up with Dr. Donnie Aho .  -Repeat proBNP of 1400. Seen by Dr. Donnie Aho and evaluated the 2-D echo which he recommends shows severe mitral regurgitation.  -Lasix switch to IV to increase diuresis.  -Monitor daily weights and strict I&Os. Her weight seems to have increased in past 2 days. Monitor her pro BNP in the morning. -If symptoms not improved with increasing dose of Lasix and fluid accumulates further will request pulmonary to evaluate.   Acute kidney injury  Elevated creatinine likely in the setting of IV Lasix.. We will monitor her kidney function closely. We'll send for UA and also check bladder scan.Marland Kitchen Her CO2 is also elevated again in the setting of Lasix. We'll  follow up with repeat labs in the evening.   Hypertension  Patient had episode of low blood pressure yesterday and given 500 cc IV bolus. Her BP stable today. Continue with IV Lasix.Marland Kitchen continue beta blocker with holding parameters  Hypothyroidism  Stable. continue Synthroid.   History of stroke  Continue Aggrenox and Zocor   Chronic right upper back pain. 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 I have started her on Neurontin along with lidocaine patch and Cream which Has markedly Improve Her symptoms.  Code Status: DO NOT RESUSCITATE  Family Communication: None at bedside  Disposition Plan: Home once Stable   Consultants:  Dr Donnie Aho Procedures:  Rt thoracentesis with 1.4 L fluid removed Antibiotics:  none     HPI/Subjective: Patient complained of blurry vision over left eye yesterday and blood pressure checked was noted to be systolic BP in 70s. She was given 500 cc IV fluid bolus followed by improvement in her vision and blood pressure. She also complained of severe pain over right upper back that is limiting her to move and get out of bed. A daughter informed that patient had herpes zoster around that area several years back. Urine output monitoring has been adequate over last 24 hours. Patient has made about 350 cc urine this morning. She informs her back pain to be better today.  Objective: Filed Vitals:   05/18/12 1520 05/18/12 2200 05/19/12 0600 05/19/12 0905  BP: 86/42 132/71 98/60 108/42  Pulse:  92 79   Temp:  98 F (36.7 C) 97.5 F (36.4 C)   TempSrc:  Oral Oral   Resp:  18 18   Height:      Weight:   77.111 kg (170 lb)   SpO2:  98% 97%     Intake/Output Summary (Last 24 hours) at 05/19/12 1123 Last data filed at 05/18/12 1230  Gross per 24 hour  Intake      0 ml  Output    200 ml  Net   -200 ml   Filed Weights   05/16/12 2229 05/18/12 0600 05/19/12 0600  Weight: 75.751 kg (167 lb) 76.204 kg (168 lb) 77.111 kg (170 lb)     Exam:  General: Elderly female lying in bed in no acute distress  HEENT: Minus over right eye. No pallor, moist oral mucosa Cardiovascular: Normal S1 and S2, no murmurs  Respiratory: improved breath sounds over right side. normal breath sounds over left lung  Abdomen: Soft, nontender, nondistended, bowel sounds present  Extremities: Warm, no edema  CNS: AAO X3   Data Reviewed: Basic Metabolic Panel:  Lab 05/19/12 0981 05/18/12 1634 05/18/12 0516 05/16/12 1819  NA 138 136 138 136  K 3.9 4.5 4.5 4.6  CL 92* 92* 91* 91*  CO2 40* 40* 41* 38*  GLUCOSE 97 169* 81 94  BUN 40* 35* 30* 28*  CREATININE 1.89* 1.86* 1.49* 1.01  CALCIUM 8.8 8.8 9.2 10.0  MG -- -- -- --  PHOS -- -- -- --   Liver Function Tests:  Lab 05/18/12 0516  AST 20  ALT 9  ALKPHOS 62  BILITOT 0.2*  PROT 6.3  ALBUMIN 2.7*   No results found for this basename: LIPASE:5,AMYLASE:5 in the last 168 hours No results found for this basename: AMMONIA:5 in the last 168 hours CBC:  Lab 05/16/12 1819  WBC 6.2  NEUTROABS 4.5  HGB 11.2*  HCT 34.2*  MCV 95.3  PLT 220   Cardiac Enzymes: No results found for this basename: CKTOTAL:5,CKMB:5,CKMBINDEX:5,TROPONINI:5 in the last 168 hours BNP (last 3 results)  Basename 05/17/12 1334 04/16/12 0518 04/15/12 1113  PROBNP 1423.0* 4011.0* 4040.0*   CBG: No results found for this basename: GLUCAP:5 in the last 168 hours  Recent Results (from the past 240 hour(s))  URINE CULTURE     Status: Normal   Collection Time   05/17/12  6:45 PM      Component Value Range Status Comment   Specimen Description URINE, CLEAN CATCH   Final    Special Requests NONE   Final    Culture  Setup Time 05/18/2012 02:29   Final    Colony Count NO GROWTH   Final    Culture NO GROWTH   Final    Report Status 05/19/2012 FINAL   Final      Studies: Dg Chest 1 View  05/17/2012  *RADIOLOGY REPORT*  Clinical Data: Post right-sided thoracentesis  CHEST - 1 VIEW  Comparison: 05/16/2012;  04/20/2012; chest CT - 04/15/2012  Findings:  Interval reduction and right-sided pleural effusion post thoracentesis.  No definite pneumothorax.  Grossly unchanged cardiac silhouette and mediastinal contours.  Small bilateral pleural effusions remain.  Grossly unchanged bibasilar heterogeneous opacities.  No new focal airspace opacity.  Grossly unchanged bones.  IMPRESSION: 1.  Interval reduction in now small residual right-sided pleural effusion post thoracentesis.  No pneumothorax. 2.  Grossly unchanged small left-sided effusion and bibasilar opacities, likely atelectasis.   Original Report Authenticated By: Tacey Ruiz, MD    US Thoracentesis Asp Pleural Space W/img Guide  05/17/2012  *RADIOLOGY REPORT*  Clinical Data:  Recurrent bilateral pleural effusions,  right greater than left; pulmonary artery hypertension; dyspnea.  Request is made for therapeutic right thoracentesis.  ULTRASOUND GUIDED THERAPEUTIC  RIGHT THORACENTESIS  An ultrasound guided thoracentesis was thoroughly discussed with the patient and questions answered.  The benefits, risks, alternatives and complications were also discussed.  The patient understands and wishes to proceed with the procedure.  Written consent was obtained.  Ultrasound was performed to localize and mark an adequate pocket of fluid in the right chest.  The area was then prepped and draped in the normal sterile fashion.  1% Lidocaine was used for local anesthesia.  Under ultrasound guidance a 19 gauge Yueh catheter was introduced.  Thoracentesis was performed.  The catheter was removed and a dressing applied.  Complications:  none  Findings: A total of approximately 1.3 liters of yellow fluid was removed. Only the above amount of fluid was removed at this time secondary to increased patient coughing.  IMPRESSION: Successful ultrasound guided therapeutic  right thoracentesis yielding 1.3 liters of pleural fluid.  Read by: Jeananne Rama, P.A.-C   Original Report Authenticated  By: Irish Lack, M.D.     Scheduled Meds:   . capsaicin   Topical BID  . dipyridamole-aspirin  1 capsule Oral BID  . enoxaparin (LOVENOX) injection  30 mg Subcutaneous Q24H  . furosemide  40 mg Intravenous BID  . gabapentin  100 mg Oral TID  . levothyroxine  100 mcg Oral Daily  . lidocaine  1 patch Transdermal Q24H  . metoprolol tartrate  12.5 mg Oral BID  . pantoprazole  40 mg Oral Daily  . simvastatin  40 mg Oral Q2200  . sodium chloride  3 mL Intravenous Q12H   Continuous Infusions:     Time spent: 25 MINUTES    Milly Goggins  [facility] Pager [phone]. If 8PM-8AM, please contact night-coverage at [email or web address], password [facility] 05/19/2012, 11:23 AM  LOS: 3 days

## 2012-05-20 ENCOUNTER — Inpatient Hospital Stay (HOSPITAL_COMMUNITY): Payer: Medicare Other

## 2012-05-20 LAB — BASIC METABOLIC PANEL
Chloride: 92 mEq/L — ABNORMAL LOW (ref 96–112)
GFR calc Af Amer: 40 mL/min — ABNORMAL LOW (ref >90)
GFR calc non Af Amer: 34 mL/min — ABNORMAL LOW (ref >90)
Glucose, Bld: 95 mg/dL (ref 70–99)
Potassium: 3.9 mEq/L (ref 3.5–5.1)
Sodium: 139 mEq/L (ref 135–145)

## 2012-05-20 MED ORDER — ENOXAPARIN SODIUM 40 MG/0.4ML ~~LOC~~ SOLN
40.0000 mg | SUBCUTANEOUS | Status: DC
  Administered 2012-05-21: 40 mg via SUBCUTANEOUS
  Filled 2012-05-20 (×2): qty 0.4

## 2012-05-20 NOTE — Progress Notes (Signed)
[facility] PROGRESS NOTE  [name] NWG:[id number] DOB: [birth year] DOA: 05/16/2012 PCP: [name], MD  Brief narrative:  79 year old female with history off hypertension, stroke, pulmonary hypertension close admitted one month back with respiratory failure with a large right transudate pleural effusion which needs thoracentesis and discharged home on home oxygen returns with dyspnea on exertion and orthopnea with recurrent right pleural effusion.   Assessment/Plan:  recurrent right pleural effusion  - patient had a right thoracentesis done on 1/3 with 1.4 L fluid removed. Follow up chest x-ray shows interval reduction in right-sided pleural effusion with a small residual pleural fluid remaining. She had about 4 L of transudative fluid removed during last admission. She has been placed on home dose of Lasix as high dose of Lasix was not much effective in improving her shortness of breath on last admission. A 2-D echo obtained at that time showed EF of 55% with moderate mitral regurgitation and pulmonary artery hypertension. She was evaluated by pulmonary and thought her symptoms to be related to underlying restrictive lung disease and left greater than right heart failure.  -She was discharged home on home O2 with plan to follow up with Dr. Donnie Aho .  -Repeat proBNP of 1400. Seen by Dr. Donnie Aho and evaluated the 2-D echo which he recommends shows severe mitral regurgitation.  -Lasix switch to IV to increase diuresis.  -Monitor daily weights and strict I&Os. Urine output was frequent good yesterday however weight still seems to be increased.  ProBNP elevated in a.m. labs despite diuresis. -Repeat chest x-ray just shows new versus increased interstitial edema. -Monitor urine output closely .we'll discuss with Dr. Donnie Aho further.  Acute kidney injury  Elevated creatinine likely in the setting of IV Lasix.Marland Kitchen renal function better in morning labs  Hypertension  Her pressure on 1/4 improved with  IV fluids. Continue with Lasix. Holding Coreg for now  Hypothyroidism  Stable. continue Synthroid.   History of stroke  Continue Aggrenox and Zocor   Chronic right upper back pain. 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 I have started her on Neurontin along with lidocaine patch and capsicin cream. Pain worsened this morning again. Resolved with Percocet  Code Status: DO NOT RESUSCITATE  Family Communication: None at bedside  Disposition Plan: PT eval pending. Likely Home once Stable   Consultants:  Dr Donnie Aho Procedures:  Rt thoracentesis with 1.4 L fluid removed Antibiotics:  none      HPI/Subjective: Patient complained of severe right upper back pain again this morning. Given some Percocet with relief. She informs her shortness of breath to be better otherwise.  Objective: Filed Vitals:   05/19/12 1548 05/19/12 2200 05/20/12 0600 05/20/12 1412  BP: 109/63 112/64 117/64 101/60  Pulse: 76 83 90 74  Temp: 97.5 F (36.4 C) 97.7 F (36.5 C) 97.8 F (36.6 C) 97.9 F (36.6 C)  TempSrc: Axillary Oral Oral Oral  Resp: 18 18 18 20   Height:      Weight:   77.157 kg (170 lb 1.6 oz)   SpO2: 96% 94% 92% 96%    Intake/Output Summary (Last 24 hours) at 05/20/12 1733 Last data filed at 05/20/12 1300  Gross per 24 hour  Intake    540 ml  Output    750 ml  Net   -210 ml   Filed Weights   05/18/12 0600 05/19/12 0600 05/20/12 0600  Weight: 76.204 kg (168 lb) 77.111 kg (170 lb) 77.157 kg (170 lb 1.6 oz)    Exam:  General: Elderly female lying in bed in no acute distress  HEENT:  Blindness over right eye. No pallor, moist oral mucosa  Cardiovascular: Normal S1 and S2, no murmurs  Respiratory: improved breath sounds over right side, few crackles. normal breath sounds over left lung  Abdomen: Soft, nontender, nondistended, bowel sounds present  Extremities: Warm, no edema  CNS: AAO X3   Data Reviewed: Basic Metabolic Panel:  Lab 05/20/12 0102  05/19/12 1630 05/19/12 0525 05/18/12 1634 05/18/12 0516  NA 139 137 138 136 138  K 3.9 4.1 3.9 4.5 4.5  CL 92* 91* 92* 92* 91*  CO2 39* 41* 40* 40* 41*  GLUCOSE 95 158* 97 169* 81  BUN 43* 42* 40* 35* 30*  CREATININE 1.36* 1.60* 1.89* 1.86* 1.49*  CALCIUM 9.0 9.0 8.8 8.8 9.2  MG -- -- -- -- --  PHOS -- -- -- -- --   Liver Function Tests:  Lab 05/18/12 0516  AST 20  ALT 9  ALKPHOS 62  BILITOT 0.2*  PROT 6.3  ALBUMIN 2.7*   No results found for this basename: LIPASE:5,AMYLASE:5 in the last 168 hours No results found for this basename: AMMONIA:5 in the last 168 hours CBC:  Lab 05/16/12 1819  WBC 6.2  NEUTROABS 4.5  HGB 11.2*  HCT 34.2*  MCV 95.3  PLT 220   Cardiac Enzymes: No results found for this basename: CKTOTAL:5,CKMB:5,CKMBINDEX:5,TROPONINI:5 in the last 168 hours BNP (last 3 results)  Basename 05/20/12 0505 05/17/12 1334 04/16/12 0518  PROBNP 2336.0* 1423.0* 4011.0*   CBG: No results found for this basename: GLUCAP:5 in the last 168 hours  Recent Results (from the past 240 hour(s))  URINE CULTURE     Status: Normal   Collection Time   05/17/12  6:45 PM      Component Value Range Status Comment   Specimen Description URINE, CLEAN CATCH   Final    Special Requests NONE   Final    Culture  Setup Time 05/18/2012 02:29   Final    Colony Count NO GROWTH   Final    Culture NO GROWTH   Final    Report Status 05/19/2012 FINAL   Final      Studies: Dg Chest Port 1 View  05/20/2012  *RADIOLOGY REPORT*  Clinical Data: Follow up of right-sided pleural effusion.  Status post right-sided thoracentesis.  PORTABLE CHEST - 1 VIEW  Comparison: 05/17/2012  Findings: The chin overlies the left greater than right apices. Cardiomegaly accentuated by AP portable technique.  Small right- sided pleural effusion with thickening of the right minor fissure, similar.  A small left pleural effusion is similar to minimally decreased.  Mild interstitial edema is accentuated by diminished  lung volumes and new or increased.  Patchy bibasilar airspace disease is similar.  There is mild atelectasis in the inferior right upper lobe.  IMPRESSION: Similar bilateral pleural effusions, without pneumothorax.  New or increased interstitial edema, superimposed upon diminished lung volumes. Similar bibasilar airspace disease, likely atelectasis.  Minimal right upper lobe dependent opacity is also likely atelectasis.   Original Report Authenticated By: Jeronimo Greaves, M.D.     Scheduled Meds:   . capsaicin   Topical BID  . dipyridamole-aspirin  1 capsule Oral BID  . enoxaparin (LOVENOX) injection  30 mg Subcutaneous Q24H  . furosemide  40 mg Intravenous BID  . gabapentin  100 mg Oral TID  . levothyroxine  100 mcg Oral Daily  . lidocaine  1 patch Transdermal Q24H  . metoprolol  tartrate  12.5 mg Oral BID  . pantoprazole  40 mg Oral Daily  . simvastatin  40 mg Oral Q2200  . sodium chloride  3 mL Intravenous Q12H   Continuous Infusions:     Time spent: 25 minutes    Jaylan Hinojosa  [facility] Pager [phone]. If 8PM-8AM, please contact night-coverage at [email or web address], password [facility] 05/20/2012, 5:33 PM  LOS: 4 days

## 2012-05-20 NOTE — Progress Notes (Signed)
Subjective:  Hard to tell if any progress. C/o twitching all over.  C/o back pain still and was weak when came back from bathroom.  Not ambulating.  Weights are inaccurate showing increase despite negative fluid balance.  Objective:  Vital Signs in the last 24 hours: BP 117/64  Pulse 90  Temp 97.8 F (36.6 C) (Oral)  Resp 18  Ht 5\' 4"  (1.626 m)  Wt 77.157 kg (170 lb 1.6 oz)  BMI 29.20 kg/m2  SpO2 92%  Physical Exam: Pleasant WF, hard of hearing Lungs:  Rales right base Cardiac:  Regular rhythm, normal S1 and S2, no S3, 2/6 systolic murmur Abdomen:  Soft, nontender, no masses Extremities:  No edema present  Intake/Output from previous day: 01/05 0701 - 01/06 0700 In: 60 [P.O.:60] Out: 1550 [Urine:1550] Weight Filed Weights   05/18/12 0600 05/19/12 0600 05/20/12 0600  Weight: 76.204 kg (168 lb) 77.111 kg (170 lb) 77.157 kg (170 lb 1.6 oz)    Lab Results: Basic Metabolic Panel:  Basename 05/20/12 0505 05/19/12 1630  NA 139 137  K 3.9 4.1  CL 92* 91*  CO2 39* 41*  GLUCOSE 95 158*  BUN 43* 42*  CREATININE 1.36* 1.60*   BNP    Component Value Date/Time   PROBNP 2336.0* 05/20/2012 0505    Assessment/Plan: 1. Acute on chronic diastolic CHF 2. Significant mitral valve disease 3. Recurrent pleural effusion 4. Improved creatinine.  Rec:  Weight is up. Unclear if the weights are accurate or not.  Her  Creatinine is better today.  Continue IV lasix for now. BNP is a little up today. Need to weigh on standup scales.  PT to see.  Darden Palmer  MD [facility]  05/20/2012, 9:05 AM

## 2012-05-20 NOTE — Progress Notes (Signed)
PT Cancellation Note  Patient Details Name: [name] MRN: [id number] DOB: [birth year]   Cancelled Treatment:    Reason Eval/Treat Not Completed: Pain limiting ability to participate (pt refused PT 2* to severe back pain. Will follow.)   Ralene Bathe Kistler 05/20/2012, 1:17 PM [phone]

## 2012-05-20 NOTE — Progress Notes (Signed)
PT Note  Order received. Chart reviewed. Attempted PT evaluation-pt declined due to having back pain. Will check back later today. Thanks. Rebeca Alert, PT [phone]

## 2012-05-21 DIAGNOSIS — I5030 Unspecified diastolic (congestive) heart failure: Secondary | ICD-10-CM

## 2012-05-21 LAB — BASIC METABOLIC PANEL
Chloride: 90 mEq/L — ABNORMAL LOW (ref 96–112)
GFR calc Af Amer: 39 mL/min — ABNORMAL LOW (ref >90)
GFR calc non Af Amer: 33 mL/min — ABNORMAL LOW (ref >90)
Glucose, Bld: 87 mg/dL (ref 70–99)
Potassium: 4 mEq/L (ref 3.5–5.1)
Sodium: 139 mEq/L (ref 135–145)

## 2012-05-21 NOTE — Progress Notes (Signed)
CRITICAL VALUE ALERT  Critical value received:  CO2 41  Date of notification:  05/21/12  Time of notification:  0714  Critical value read back: yes  Nurse who received alert: Leeroy Bock Delmont Prosch RN  MD notified (1st page): [email or web address]  Time of first page: 0715  MD notified (2nd page):  Time of second page:  Responding MD:   Time MD responded:

## 2012-05-21 NOTE — Evaluation (Signed)
Physical Therapy Evaluation Patient Details Name: [name] MRN: [id number] DOB: 1925-03-06 Today's Date: 05/21/2012 Time: 4098-1191 PT Time Calculation (min): 23 min  PT Assessment / Plan / Recommendation Clinical Impression  80 yo female admitted with pleural effusion, back pain. Pt has history of shingles. On eval, pt is Min-guard assist for ambulation distance of 200 feet with RW. O2 sats dropped to 86% on 3 L O2. No c/o back pain during session. Discussed d/c plan-pt prefers to d/c home-does not feel she needs ST rehab. Pt states her husband can help some and daughter checks on pt often. Encouraged pt to talk with daughter to see if she can assist a little more initially until pt is a little close to baseline.Recommend HHPT with intermittent supervison.     PT Assessment  Patient needs continued PT services    Follow Up Recommendations  Home health PT;Supervision - Intermittent    Does the patient have the potential to tolerate intense rehabilitation      Barriers to Discharge        Equipment Recommendations  None recommended by PT    Recommendations for Other Services OT consult   Frequency Min 3X/week    Precautions / Restrictions Precautions Precautions: Fall Restrictions Weight Bearing Restrictions: No   Pertinent Vitals/Pain Pt denies pain      Mobility  Bed Mobility Bed Mobility: Supine to Sit Supine to Sit: HOB elevated;6: Modified independent (Device/Increase time) Transfers Transfers: Sit to Stand;Stand to Sit Sit to Stand: 5: Supervision;From bed Stand to Sit: To chair/3-in-1;6: Modified independent (Device/Increase time) Ambulation/Gait Ambulation/Gait Assistance: 4: Min guard Ambulation Distance (Feet): 200 Feet Assistive device: Rolling walker Gait Pattern: Step-through pattern;Trunk flexed;Decreased stride length    Shoulder Instructions     Exercises     PT Diagnosis: Difficulty walking;Generalized weakness  PT Problem List: Decreased  strength;Decreased mobility;Decreased activity tolerance PT Treatment Interventions: DME instruction;Gait training;Functional mobility training;Therapeutic activities;Therapeutic exercise;Patient/family education   PT Goals Acute Rehab PT Goals PT Goal Formulation: With patient Time For Goal Achievement: 05/21/12 Potential to Achieve Goals: Good Pt will go Sit to Stand: with modified independence PT Goal: Sit to Stand - Progress: Goal set today Pt will Ambulate: 51 - 150 feet;with modified independence;with rolling walker (with O2 sats > 90%) PT Goal: Ambulate - Progress: Goal set today  Visit Information  Last PT Received On: 05/21/12 Assistance Needed: +1    Subjective Data  Subjective: "I don't think I need to go anywhere for rehab" Patient Stated Goal: home   Prior Functioning  Home Living Lives With: Spouse Type of Home: House Home Access: Stairs to enter Secretary/administrator of Steps: 1-front Home Layout: One level Bathroom Shower/Tub: Tub/shower unit Home Adaptive Equipment: Walker - rolling;Straight cane;Shower chair with back Prior Function Level of Independence: Independent with assistive device(s) Able to Take Stairs?: Yes Communication Communication: No difficulties    Cognition  Overall Cognitive Status: Appears within functional limits for tasks assessed/performed Arousal/Alertness: Awake/alert Orientation Level: Appears intact for tasks assessed Behavior During Session: [facility] for tasks performed    Extremity/Trunk Assessment Right Lower Extremity Assessment RLE ROM/Strength/Tone: Deficits RLE ROM/Strength/Tone Deficits: Strength at least 4/5 with functional mobility Left Lower Extremity Assessment LLE ROM/Strength/Tone: Deficits LLE ROM/Strength/Tone Deficits: Strength at least 4/5 with functional mobility   Balance    End of Session PT - End of Session Activity Tolerance: Patient tolerated treatment well Patient left: in chair;with call bell/phone  within reach  GP     Rebeca Alert [facility] 05/21/2012,  3:59 PM [phone]

## 2012-05-21 NOTE — Progress Notes (Signed)
[facility] PROGRESS NOTE  [name] ZOX:[id number] DOB: July 08, 1924 DOA: 05/16/2012 PCP: [name], MD  Brief narrative:  79 year old female with history off hypertension, stroke, pulmonary hypertension close admitted one month back with respiratory failure with a large right transudate pleural effusion which needs thoracentesis and discharged home on home oxygen returns with dyspnea on exertion and orthopnea with recurrent right pleural effusion.   Assessment/Plan:  recurrent right pleural effusion  - patient had a right thoracentesis done on 1/3 with 1.4 L fluid removed. Follow up chest x-ray shows interval reduction in right-sided pleural effusion with a small residual pleural fluid remaining. She had about 4 L of transudative fluid removed during last admission. Marland Kitchen A 2-D echo obtained at that time showed EF of 55% with moderate mitral regurgitation and pulmonary artery hypertension. She was evaluated by pulmonary and thought her symptoms to be related to underlying restrictive lung disease and left greater than right heart failure.  -She was discharged home on home O2 with plan to follow up with Dr. Donnie Aho .  -Repeat proBNP of 1400. Seen by Dr. Donnie Aho and evaluated the 2-D echo which he recommends shows severe mitral regurgitation.  -Lasix switch to IV to increase diuresis. Has been negative by 1400 cc since admission and her weight is down by 1.5 Lbs today. -Monitor daily weights and strict I&Os.  Repeat pro BNP elevated. UOP is good. -Repeat chest x-ray on 1/6 shows new versus increased interstitial edema.  -i will repeat a CXR for 1/8 , if it still shows persistent or increase  fluid collection , then we will need to obtain a pulmonary consult to reevaluate for any lung abnormality.  Acute kidney injury  Elevated creatinine likely in the setting of IV Lasix.Marland Kitchen renal function better in morning labs . Has elevated co2 which is secondary to lasix as well.  Hypertension   Continue  with Lasix. Holding Coreg for now as she had low BP on 1/4  Hypothyroidism  Stable. continue Synthroid.   History of stroke  Continue Aggrenox and Zocor   Chronic right upper back pain. 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 I have started her on Neurontin along with lidocaine patch and capsicin cream.   Code Status: DO NOT RESUSCITATE   Family Communication: None at bedside  Disposition Plan:  Follow up CXR in am and monitor for adequate diuresis for 1 more day. If stable D/C home with HHPT   Consultants:  Dr Donnie Aho  Procedures:  Rt thoracentesis with 1.4 L fluid removed Antibiotics:  none    HPI/Subjective:  Patient unable to tell if her breathing is better but denies DOE as well. denies upper back pain at this time.    Objective: Filed Vitals:   05/20/12 1412 05/20/12 2040 05/21/12 0500 05/21/12 1541  BP: 101/60 133/64 126/60 129/63  Pulse: 74 72 81 79  Temp: 97.9 F (36.6 C) 97.6 F (36.4 C) 97.5 F (36.4 C) 97.5 F (36.4 C)  TempSrc: Oral Oral Oral Oral  Resp: 20 18 20 18   Height:      Weight:   73.392 kg (161 lb 12.8 oz)   SpO2: 96% 97% 93% 96%    Intake/Output Summary (Last 24 hours) at 05/21/12 1626 Last data filed at 05/21/12 1622  Gross per 24 hour  Intake   1479 ml  Output   1250 ml  Net    229 ml   Filed Weights   05/19/12 0600 05/20/12 0600 05/21/12 0500  Weight:  77.111 kg (170 lb) 77.157 kg (170 lb 1.6 oz) 73.392 kg (161 lb 12.8 oz)    Exam:  General: Elderly female lying in bed in no acute distress  HEENT: Blindness over right eye. No pallor, moist oral mucosa  Cardiovascular: Normal S1 and S2, no murmurs  Respiratory: improved breath sounds over right side, few crackles. normal breath sounds over left lung  Abdomen: Soft, nontender, nondistended, bowel sounds present  Extremities: Warm, no edema  CNS: AAO X3  Data Reviewed: Basic Metabolic Panel:  Lab 05/21/12 7829 05/20/12 0505 05/19/12 1630 05/19/12 0525  05/18/12 1634  NA 139 139 137 138 136  K 4.0 3.9 4.1 3.9 4.5  CL 90* 92* 91* 92* 92*  CO2 41* 39* 41* 40* 40*  GLUCOSE 87 95 158* 97 169*  BUN 44* 43* 42* 40* 35*  CREATININE 1.40* 1.36* 1.60* 1.89* 1.86*  CALCIUM 9.2 9.0 9.0 8.8 8.8  MG -- -- -- -- --  PHOS -- -- -- -- --   Liver Function Tests:  Lab 05/18/12 0516  AST 20  ALT 9  ALKPHOS 62  BILITOT 0.2*  PROT 6.3  ALBUMIN 2.7*   No results found for this basename: LIPASE:5,AMYLASE:5 in the last 168 hours No results found for this basename: AMMONIA:5 in the last 168 hours CBC:  Lab 05/16/12 1819  WBC 6.2  NEUTROABS 4.5  HGB 11.2*  HCT 34.2*  MCV 95.3  PLT 220   Cardiac Enzymes: No results found for this basename: CKTOTAL:5,CKMB:5,CKMBINDEX:5,TROPONINI:5 in the last 168 hours BNP (last 3 results)  Basename 05/20/12 0505 05/17/12 1334 04/16/12 0518  PROBNP 2336.0* 1423.0* 4011.0*   CBG: No results found for this basename: GLUCAP:5 in the last 168 hours  Recent Results (from the past 240 hour(s))  URINE CULTURE     Status: Normal   Collection Time   05/17/12  6:45 PM      Component Value Range Status Comment   Specimen Description URINE, CLEAN CATCH   Final    Special Requests NONE   Final    Culture  Setup Time 05/18/2012 02:29   Final    Colony Count NO GROWTH   Final    Culture NO GROWTH   Final    Report Status 05/19/2012 FINAL   Final      Studies: Dg Chest Port 1 View  05/20/2012  *RADIOLOGY REPORT*  Clinical Data: Follow up of right-sided pleural effusion.  Status post right-sided thoracentesis.  PORTABLE CHEST - 1 VIEW  Comparison: 05/17/2012  Findings: The chin overlies the left greater than right apices. Cardiomegaly accentuated by AP portable technique.  Small right- sided pleural effusion with thickening of the right minor fissure, similar.  A small left pleural effusion is similar to minimally decreased.  Mild interstitial edema is accentuated by diminished lung volumes and new or increased.  Patchy  bibasilar airspace disease is similar.  There is mild atelectasis in the inferior right upper lobe.  IMPRESSION: Similar bilateral pleural effusions, without pneumothorax.  New or increased interstitial edema, superimposed upon diminished lung volumes. Similar bibasilar airspace disease, likely atelectasis.  Minimal right upper lobe dependent opacity is also likely atelectasis.   Original Report Authenticated By: Jeronimo Greaves, M.D.     Scheduled Meds:   . capsaicin   Topical BID  . dipyridamole-aspirin  1 capsule Oral BID  . enoxaparin (LOVENOX) injection  40 mg Subcutaneous Q24H  . furosemide  40 mg Intravenous BID  . gabapentin  100 mg Oral TID  .  levothyroxine  100 mcg Oral Daily  . lidocaine  1 patch Transdermal Q24H  . metoprolol tartrate  12.5 mg Oral BID  . pantoprazole  40 mg Oral Daily  . simvastatin  40 mg Oral Q2200  . sodium chloride  3 mL Intravenous Q12H   Continuous Infusions:    Time spent: 25 minutes    Azriella Mattia  [facility] Pager [phone] If 8PM-8AM, please contact night-coverage at [email or web address], password [facility] 05/21/2012, 4:26 PM  LOS: 5 days

## 2012-05-22 ENCOUNTER — Inpatient Hospital Stay (HOSPITAL_COMMUNITY): Payer: Medicare Other

## 2012-05-22 DIAGNOSIS — I2510 Atherosclerotic heart disease of native coronary artery without angina pectoris: Secondary | ICD-10-CM

## 2012-05-22 LAB — BASIC METABOLIC PANEL
CO2: 45 mEq/L (ref 19–32)
Chloride: 93 mEq/L — ABNORMAL LOW (ref 96–112)
GFR calc Af Amer: 37 mL/min — ABNORMAL LOW (ref >90)
Potassium: 4.1 mEq/L (ref 3.5–5.1)

## 2012-05-22 LAB — LEGIONELLA ANTIGEN, URINE

## 2012-05-22 MED ORDER — ENOXAPARIN SODIUM 30 MG/0.3ML ~~LOC~~ SOLN
30.0000 mg | SUBCUTANEOUS | Status: DC
  Administered 2012-05-22 – 2012-05-24 (×3): 30 mg via SUBCUTANEOUS
  Filled 2012-05-22 (×4): qty 0.3

## 2012-05-22 MED ORDER — FUROSEMIDE 40 MG PO TABS
40.0000 mg | ORAL_TABLET | Freq: Two times a day (BID) | ORAL | Status: DC
  Administered 2012-05-22 – 2012-05-25 (×7): 40 mg via ORAL
  Filled 2012-05-22 (×9): qty 1

## 2012-05-22 MED ORDER — ACETAZOLAMIDE 250 MG PO TABS
250.0000 mg | ORAL_TABLET | Freq: Two times a day (BID) | ORAL | Status: AC
  Administered 2012-05-22 – 2012-05-24 (×6): 250 mg via ORAL
  Filled 2012-05-22 (×6): qty 1

## 2012-05-22 NOTE — Progress Notes (Signed)
Physical Therapy Treatment Patient Details Name: [name] MRN: [id number] DOB: 07/18/1924 Today's Date: 05/22/2012 Time: 4098-1191 PT Time Calculation (min): 24 min  PT Assessment / Plan / Recommendation Comments on Treatment Session  Pt. ambulated on 3 l w/ drop in sats to 83%. Back to 95%  after a rest. Pt. has home O2. Recommend HHPT    Follow Up Recommendations  Home health PT     Does the patient have the potential to tolerate intense rehabilitation     Barriers to Discharge        Equipment Recommendations  None recommended by PT    Recommendations for Other Services    Frequency Min 3X/week   Plan Discharge plan remains appropriate;Frequency remains appropriate    Precautions / Restrictions Precautions Precautions: Fall Precaution Comments: monitor sats on O2   Pertinent Vitals/Pain sats 95% 3 l After ambulating=83%. MD in and aware, can increase to 4 l.    Mobility  Bed Mobility Supine to Sit: 6: Modified independent (Device/Increase time);HOB elevated Transfers Sit to Stand: 5: Supervision;From bed Stand to Sit: To bed;6: Modified independent (Device/Increase time) Ambulation/Gait Ambulation/Gait Assistance: 4: Min guard Ambulation Distance (Feet): 300 Feet Assistive device: Rolling walker Ambulation/Gait Assistance Details: cues for avoiding some objects in hallway Gait Pattern: Step-through pattern;Trunk flexed    Exercises     PT Diagnosis:    PT Problem List:   PT Treatment Interventions:     PT Goals Acute Rehab PT Goals Pt will go Sit to Stand: with modified independence PT Goal: Sit to Stand - Progress: Progressing toward goal Pt will Ambulate: 51 - 150 feet;with modified independence;with rolling walker PT Goal: Ambulate - Progress: Progressing toward goal  Visit Information  Last PT Received On: 05/22/12 Assistance Needed: +1    Subjective Data  Subjective: My knees are tired. i get SOB.   Cognition  Overall Cognitive Status:  Appears within functional limits for tasks assessed/performed    Balance     End of Session PT - End of Session Activity Tolerance: Patient limited by fatigue Patient left: in bed;with call bell/phone within reach Nurse Communication: Mobility status   GP     Rada Hay 05/22/2012, 9:19 AM

## 2012-05-22 NOTE — Progress Notes (Signed)
Patient ID: [name], female   DOB: October 31, 1924, 79 y.o.   MRN: [id number]  [facility] PROGRESS NOTE  Amilliana Hayworth Alumbaugh WUJ:[id number] DOB: 30-Jun-1924 DOA: 05/16/2012 PCP: [name], MD  Brief narrative: 79 year old female with history off hypertension, stroke, pulmonary hypertension close admitted one month back with respiratory failure with a large right transudate pleural effusion which needs thoracentesis and discharged home on home oxygen returns with dyspnea on exertion and orthopnea with recurrent right pleural effusion.   Assessment/Plan:  Recurrent right pleural effusion  - s/p Rt thoracentesis 1/3 with 1.4 L fluid removed - f/u CXR with worsening Rt sided pleural effusion, pt clinically stable and maintaining oxygen sats > 93% on 3 L - d/w PCCM on call and recommendation is to observe clinically and if stable, repeat CXR in next week - weight 167 --> 161 lbs this AM, approaching dry weight, good urine output Acute on chronic kidney failure  - creatine slightly up from yesterday, Lasix changed to PO Hypertension  - reasonable control inpatient  Hypothyroidism  - Stable. continue Synthroid.  History of stroke  - Continue Aggrenox and Zocor  Chronic right upper back pain.  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  Smaller left effusion and basilar airspace disease have also increased.     XRAY 05/20/2012 -->  Similar B pleural effusions. New interstitial edema, superimposed upon diminished lung volumes. Similar airspace disease, likely atelectasis.  Rt thoracentesis 05/17/2012 --> 1.4 L fluid removed  Antibiotics:  None  Code Status: DNR Family Communication: Pt at bedside Disposition Plan: Home when medically stable with PT  HPI/Subjective: No  events overnight. Pt denies chest pain or shortness of breath.  Objective: Filed Vitals:   05/21/12 0500 05/21/12 1541 05/21/12 2138 05/22/12 0600  BP: 126/60 129/63 118/65 121/64  Pulse: 81 79 77 62  Temp: 97.5 F (36.4 C) 97.5 F (36.4 C) 97.7 F (36.5 C) 98.2 F (36.8 C)  TempSrc: Oral Oral Oral Oral  Resp: 20 18 16 16   Height:      Weight: 73.392 kg (161 lb 12.8 oz)   73.12 kg (161 lb 3.2 oz)  SpO2: 93% 96% 95% 95%    Intake/Output Summary (Last 24 hours) at 05/22/12 1257 Last data filed at 05/22/12 0959  Gross per 24 hour  Intake    960 ml  Output   1500 ml  Net   -540 ml    Exam:   General:  Pt is alert, follows commands appropriately, not in acute distress  Cardiovascular: Regular rate and rhythm, S1/S2, SEM 2/6, no rubs, no gallops  Respiratory: Rales R>L, no respiratory distress  Abdomen: Soft, non tender, non distended, bowel sounds present, no guarding  Extremities: No edema, pulses DP and PT palpable bilaterally  Neuro: Grossly nonfocal  Data Reviewed: Basic Metabolic Panel:  Lab 05/22/12 9562 05/21/12 0525 05/20/12 0505 05/19/12 1630 05/19/12 0525  NA 140 139 139 137 138  K 4.1 4.0 3.9 4.1 3.9  CL 93* 90* 92* 91* 92*  CO2 45* 41* 39* 41* 40*  GLUCOSE 106* 87 95 158* 97  BUN 46* 44* 43* 42* 40*  CREATININE 1.46* 1.40* 1.36* 1.60* 1.89*  CALCIUM 9.2 9.2 9.0 9.0 8.8  MG -- -- -- -- --  PHOS -- -- -- -- --  Liver Function Tests:  Lab 05/18/12 0516  AST 20  ALT 9  ALKPHOS 62  BILITOT 0.2*  PROT 6.3  ALBUMIN 2.7*   CBC:  Lab 05/16/12 1819  WBC 6.2  NEUTROABS 4.5  HGB 11.2*  HCT 34.2*  MCV 95.3  PLT 220   Recent Results (from the past 240 hour(s))  URINE CULTURE     Status: Normal   Collection Time   05/17/12  6:45 PM      Component Value Range Status Comment   Specimen Description URINE, CLEAN CATCH   Final    Special Requests NONE   Final    Culture  Setup Time 05/18/2012 02:29   Final    Colony Count NO GROWTH   Final     Culture NO GROWTH   Final    Report Status 05/19/2012 FINAL   Final      Scheduled Meds:   . acetaZOLAMIDE  250 mg Oral BID  . dipyridamole-aspirin  1 capsule Oral BID  . enoxaparin injection  30 mg Subcutaneous Q24H  . furosemide  40 mg Oral BID  . gabapentin  100 mg Oral TID  . levothyroxine  100 mcg Oral Daily  . lidocaine  1 patch Transdermal Q24H  . metoprolol tartrate  12.5 mg Oral BID  . pantoprazole  40 mg Oral Daily  . simvastatin  40 mg Oral Q2200   Continuous Infusions:    Debbora Presto, MD  [facility] Pager [phone]  If 7PM-7AM, please contact night-coverage [email or web address] Password [email or web address] 05/22/2012, 12:57 PM   LOS: 6 days

## 2012-05-22 NOTE — Progress Notes (Signed)
Subjective:  Not able to see yesterday.  She says here breathing is better but still needs oxygen when ambulates.  Major c/o is back pain and twitching. Wants to know the cause.  Objective:  Vital Signs in the last 24 hours: BP 121/64  Pulse 62  Temp 98.2 F (36.8 C) (Oral)  Resp 16  Ht 5\' 4"  (1.626 m)  Wt 73.12 kg (161 lb 3.2 oz)  BMI 27.67 kg/m2  SpO2 95%  Physical Exam: Pleasant WF, hard of hearing Lungs:  Rales right base Cardiac:  Regular rhythm, normal S1 and S2, no S3, 2/6 systolic murmur Abdomen:  Soft, nontender, no masses Extremities:  No edema present  Intake/Output from previous day: 01/07 0701 - 01/08 0700 In: 1200 [P.O.:840; I.V.:360] Out: 1750 [Urine:1750] Weight Filed Weights   05/20/12 0600 05/21/12 0500 05/22/12 0600  Weight: 77.157 kg (170 lb 1.6 oz) 73.392 kg (161 lb 12.8 oz) 73.12 kg (161 lb 3.2 oz)    Lab Results: Basic Metabolic Panel:  Basename 05/22/12 0528 05/21/12 0525  NA 140 139  K 4.1 4.0  CL 93* 90*  CO2 45* 41*  GLUCOSE 106* 87  BUN 46* 44*  CREATININE 1.46* 1.40*   BNP    Component Value Date/Time   PROBNP 2336.0* 05/20/2012 0505    Assessment/Plan: 1. Acute on chronic diastolic CHF appears to be approaching dry weight.  BUN increasing 2. Significant mitral valve disease 3. Recurrent pleural effusion 4. Chronic kidney disease 5. Contraction alkalosis  Rec:  I think we can change to oral diuretics.  3 days of diamox to help contraction alkalosis. Does she need imaging of her spine?  Darden Palmer  MD [facility]  05/22/2012, 7:50 AM

## 2012-05-22 NOTE — Progress Notes (Signed)
Physical therapy informed me that while walking in hall way the patient's oxygen tank fell and hit her right thigh.  Patient states her thigh does not hurt, no bruising/reddness noted, Dr. Lenise Arena notified of this incident.

## 2012-05-23 LAB — CBC
MCV: 100.6 fL — ABNORMAL HIGH (ref 78.0–100.0)
Platelets: 157 10*3/uL (ref 150–400)
RBC: 3.25 MIL/uL — ABNORMAL LOW (ref 3.87–5.11)
RDW: 13.3 % (ref 11.5–15.5)
WBC: 4.9 10*3/uL (ref 4.0–10.5)

## 2012-05-23 LAB — BASIC METABOLIC PANEL
Calcium: 9.1 mg/dL (ref 8.4–10.5)
Creatinine, Ser: 1.57 mg/dL — ABNORMAL HIGH (ref 0.50–1.10)
GFR calc non Af Amer: 29 mL/min — ABNORMAL LOW (ref >90)
Sodium: 137 mEq/L (ref 135–145)

## 2012-05-23 NOTE — Progress Notes (Signed)
Subjective:  Breathing is better and not currently SOB.  She desats with exercise and will need supplemental oxygen on D/C  Weight is down.  Objective:  Vital Signs in the last 24 hours: BP 115/52  Pulse 77  Temp 97.4 F (36.3 C) (Oral)  Resp 16  Ht 5\' 4"  (1.626 m)  Wt 72.938 kg (160 lb 12.8 oz)  BMI 27.60 kg/m2  SpO2 94%  Physical Exam: Pleasant WF, hard of hearing Lungs:  Rales right base Cardiac:  Regular rhythm, normal S1 and S2, no S3, 2/6 systolic murmur Abdomen:  Soft, nontender, no masses Extremities:  No edema present  Intake/Output from previous day: 01/08 0701 - 01/09 0700 In: 680 [P.O.:600; I.V.:80] Out: 600 [Urine:600] Weight Filed Weights   05/21/12 0500 05/22/12 0600 05/23/12 0500  Weight: 73.392 kg (161 lb 12.8 oz) 73.12 kg (161 lb 3.2 oz) 72.938 kg (160 lb 12.8 oz)    Lab Results: Basic Metabolic Panel:  Basename 05/23/12 0520 05/22/12 0528  NA 137 140  K 4.6 4.1  CL 90* 93*  CO2 43* 45*  GLUCOSE 111* 106*  BUN 46* 46*  CREATININE 1.57* 1.46*   BNP    Component Value Date/Time   PROBNP 2336.0* 05/20/2012 0505    Assessment/Plan: 1. Acute on chronic diastolic CHF appears to be at dry weight.  BUN increasing 2. Significant mitral valve disease 3. Recurrent pleural effusion stable 4. Chronic kidney disease 5. Contraction alkalosis  Rec:  Continue a couple more days of Diamox to help with contraction alkalosis.  I would d/c on BID Lasix.  I will sign off.  Have her see me in one week if she goes home or if goes to SNF, one week post discharge from SNF.  Darden Palmer  MD [facility]  05/23/2012, 12:49 PM

## 2012-05-23 NOTE — Progress Notes (Signed)
Patient ID: [name], female   DOB: 1924/10/17, 79 y.o.   MRN: [id number]  [facility] PROGRESS NOTE  [name] QMV:[id number] DOB: 08-06-1924 DOA: 05/16/2012 PCP: [name], MD  Brief narrative:  79 year old female with history off hypertension, stroke, pulmonary hypertension close admitted one month back with respiratory failure with a large right transudate pleural effusion which needs thoracentesis and discharged home on home oxygen returns with dyspnea on exertion and orthopnea with recurrent right pleural effusion.   Assessment/Plan:  Recurrent right pleural effusion  - s/p Rt thoracentesis 1/3 with 1.4 L fluid removed  - f/u CXR with worsening Rt sided pleural effusion, pt clinically stable and maintaining oxygen sats > 93% on 3 L  - d/w PCCM on call and recommendation is to observe clinically and if stable, repeat CXR in next week  - weight 167 --> 160 lbs this AM, approaching dry weight, good urine output  Acute on chronic kidney failure  - creatine slightly up from yesterday, Lasix changed to PO BID and pt tolerating well - BMP in AM Hypertension  - reasonable control inpatient  Hypothyroidism  - Stable. continue Synthroid.  History of stroke  - Continue Aggrenox and Zocor  Chronic right upper back pain.  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 Smaller left effusion and basilar airspace disease have also increased.  XRAY 05/20/2012 --> Similar B pleural effusions. New interstitial edema, superimposed upon diminished lung volumes. Similar airspace disease, likely atelectasis. Rt thoracentesis 05/17/2012 --> 1.4 L fluid removed Antibiotics:  None  Code Status: DNR  Family Communication: Pt at bedside  Disposition Plan: SNF placement n  progress  HPI/Subjective: No events overnight.   Objective: Filed Vitals:   05/22/12 2200 05/23/12 0500 05/23/12 0635 05/23/12 1354  BP: 117/64  115/52 108/60  Pulse: 68  77 78  Temp: 97.5 F (36.4 C)  97.4 F (36.3 C) 97.3 F (36.3 C)  TempSrc: Oral  Oral Oral  Resp: 14  16 18   Height:      Weight:  72.938 kg (160 lb 12.8 oz)    SpO2: 97%  94% 90%    Intake/Output Summary (Last 24 hours) at 05/23/12 2042 Last data filed at 05/23/12 1900  Gross per 24 hour  Intake    680 ml  Output   1350 ml  Net   -670 ml    Exam:   General:  Pt is alert, follows commands appropriately, not in acute distress  Cardiovascular: Regular rate and rhythm, S1/S2, no murmurs, no rubs, no gallops  Respiratory: Decreased air movement on the right side, rales at bases on the left and right side  Abdomen: Soft, non tender, non distended, bowel sounds present, no guarding  Extremities: No edema, pulses DP and PT palpable bilaterally  Neuro: Grossly nonfocal  Data Reviewed: Basic Metabolic Panel:  Lab 05/23/12 2841 05/22/12 0528 05/21/12 0525 05/20/12 0505 05/19/12 1630  NA 137 140 139 139 137  K 4.6 4.1 4.0 3.9 4.1  CL 90* 93* 90* 92* 91*  CO2 43* 45* 41* 39* 41*  GLUCOSE 111* 106* 87 95 158*  BUN 46* 46* 44* 43* 42*  CREATININE 1.57* 1.46* 1.40* 1.36* 1.60*  CALCIUM 9.1 9.2 9.2 9.0 9.0  MG -- -- -- -- --  PHOS -- -- -- -- --  Liver Function Tests:  Lab 05/18/12 0516  AST 20  ALT 9  ALKPHOS 62  BILITOT 0.2*  PROT 6.3  ALBUMIN 2.7*    CBC:  Lab 05/23/12 0520  WBC 4.9  NEUTROABS --  HGB 9.9*  HCT 32.7*  MCV 100.6*  PLT 157    Recent Results (from the past 240 hour(s))  URINE CULTURE     Status: Normal   Collection Time   05/17/12  6:45 PM      Component Value Range Status Comment   Specimen Description URINE, CLEAN CATCH   Final    Special Requests NONE   Final    Culture  Setup Time 05/18/2012 02:29   Final    Colony Count NO GROWTH   Final    Culture NO  GROWTH   Final    Report Status 05/19/2012 FINAL   Final      Scheduled Meds:   . acetaZOLAMIDE  250 mg Oral BID  . capsaicin   Topical BID  . dipyridamole-aspirin  1 capsule Oral BID  . enoxaparin (LOVENOX) injection  30 mg Subcutaneous Q24H  . furosemide  40 mg Oral BID  . gabapentin  100 mg Oral TID  . levothyroxine  100 mcg Oral Daily  . lidocaine  1 patch Transdermal Q24H  . metoprolol tartrate  12.5 mg Oral BID  . pantoprazole  40 mg Oral Daily  . simvastatin  40 mg Oral Q2200  . sodium chloride  3 mL Intravenous Q12H   Continuous Infusions:    Debbora Presto, MD  [facility] Pager [phone]  If 7PM-7AM, please contact night-coverage [email or web address] Password [email or web address] 05/23/2012, 8:42 PM   LOS: 7 days

## 2012-05-23 NOTE — Progress Notes (Signed)
Patient evaluated for long-term disease management services with [facility]. Patient sleeping soundly during visit. Spoke with grand daughter Helmut Muster to explain services. Explained to her that patient will receive post transition of care call once she returns home. At that time, will determine whether patient and daughter, [name], are agreeable to [facility] services.      Arvella Merles, RN,BSN [facility] Liaison [phone]

## 2012-05-24 LAB — BASIC METABOLIC PANEL
CO2: 40 mEq/L (ref 19–32)
Calcium: 9 mg/dL (ref 8.4–10.5)
Chloride: 90 mEq/L — ABNORMAL LOW (ref 96–112)
Creatinine, Ser: 1.66 mg/dL — ABNORMAL HIGH (ref 0.50–1.10)
GFR calc Af Amer: 31 mL/min — ABNORMAL LOW (ref >90)
Sodium: 136 mEq/L (ref 135–145)

## 2012-05-24 MED ORDER — GABAPENTIN 100 MG PO CAPS: 100.0000 mg | ORAL_CAPSULE | Freq: Three times a day (TID) | ORAL | Status: DC | PRN

## 2012-05-24 MED ORDER — OXYCODONE-ACETAMINOPHEN 5-325 MG PO TABS: 1.0000 | ORAL_TABLET | Freq: Four times a day (QID) | ORAL | Status: DC | PRN

## 2012-05-24 MED ORDER — ACETAZOLAMIDE 250 MG PO TABS: 250.0000 mg | ORAL_TABLET | Freq: Two times a day (BID) | ORAL | Status: DC

## 2012-05-24 MED ORDER — LIDOCAINE 5 % EX PTCH: 1.0000 | MEDICATED_PATCH | CUTANEOUS | Status: DC

## 2012-05-24 MED ORDER — FUROSEMIDE 40 MG PO TABS: 40.0000 mg | ORAL_TABLET | Freq: Two times a day (BID) | ORAL | Status: DC

## 2012-05-24 NOTE — Progress Notes (Signed)
Spoke with pt's daughter [name] yesterday about d/c planning. She stated that pt had [facility] in the past and would like to use them again for HHPT/RN services. She stated they have yet to decide if pt is returning to her house or her daughter's house at discharge but will let us know so that home care can know where to go to see the pt.

## 2012-05-24 NOTE — Progress Notes (Signed)
Clinical Social Work Department BRIEF PSYCHOSOCIAL ASSESSMENT 05/24/2012  Patient:  [name], [name]     Account Number:  [id number]     Admit date:  05/16/2012  Clinical Social Worker:  Hattie Perch  Date/Time:  05/24/2012 12:00 M  Referred by:  Physician  Date Referred:  05/24/2012 Referred for  SNF Placement  SNF Placement   Other Referral:   Interview type:  Patient Other interview type:    PSYCHOSOCIAL DATA Living Status:  FAMILY Admitted from facility:   Level of care:   Primary support name:  [name] Primary support relationship to patient:  CHILD, ADULT Degree of support available:   good    CURRENT CONCERNS Current Concerns  Post-Acute Placement   Other Concerns:    SOCIAL WORK ASSESSMENT / PLAN CSW met with patient. patient wishes to go to [facility]. patient is medically cleared but can not go to [facility] until tomorrow.   Assessment/plan status:   Other assessment/ plan:   Information/referral to community resources:    PATIENT'S/FAMILY'S RESPONSE TO PLAN OF CARE: [facility] to accept patient for snf tomorrow.

## 2012-05-24 NOTE — Progress Notes (Signed)
CRITICAL VALUE ALERT  Critical value received: CO2 40  Date of notification:  1/10  Time of notification:  0615  Critical value read back: yes  Nurse who received alert:  Thurnell Garbe RN  MD notified (1st page):  Benedetto Coons  Time of first page: 0618  MD notified (2nd page):  Time of second page:  Responding MD:   Time MD responded:

## 2012-05-24 NOTE — Progress Notes (Signed)
Physical Therapy Treatment Patient Details Name: [name] MRN: [id number] DOB: 01-05-1925 Today's Date: 05/24/2012 Time: 7829-5621 PT Time Calculation (min): 14 min  PT Assessment / Plan / Recommendation Comments on Treatment Session  Pt. ambulated on 4 l today with sats drop to 86%. HR 91. Pt. tolerated well.     Follow Up Recommendations  Home health PT;Supervision/Assistance - 24 hour     Does the patient have the potential to tolerate intense rehabilitation     Barriers to Discharge        Equipment Recommendations  None recommended by PT    Recommendations for Other Services    Frequency Min 3X/week   Plan Discharge plan remains appropriate;Frequency remains appropriate    Precautions / Restrictions Precautions Precautions: Fall Precaution Comments: monitor sats on O2  Restrictions Weight Bearing Restrictions: No   Pertinent Vitals/Pain sats 86% 4 l after ambulating 300 ft.    Mobility  Bed Mobility Supine to Sit: 6: Modified independent (Device/Increase time) Transfers Sit to Stand: 5: Supervision Stand to Sit: 6: Modified independent (Device/Increase time) Ambulation/Gait Ambulation/Gait Assistance: 4: Min guard Ambulation Distance (Feet): 300 Feet Assistive device: Rolling walker Ambulation/Gait Assistance Details: cues for avoiding some objects in hallway  Gait Pattern: Step-through pattern Gait velocity: improved, one stop for catching breath    Exercises     PT Diagnosis:    PT Problem List:   PT Treatment Interventions:     PT Goals Acute Rehab PT Goals PT Goal Formulation: With patient Time For Goal Achievement: 05/31/12 Pt will go Sit to Stand: with modified independence PT Goal: Sit to Stand - Progress: Progressing toward goal Pt will Ambulate: >150 feet;with modified independence;with rolling walker PT Goal: Ambulate - Progress: Goal set today  Visit Information  Last PT Received On: 05/24/12 Assistance Needed: +1    Subjective  Data  Subjective: I had a shower. I am going to [facility]   Cognition  Overall Cognitive Status: Appears within functional limits for tasks assessed/performed    Balance     End of Session PT - End of Session Activity Tolerance: Patient tolerated treatment well Patient left: in bed;with call bell/phone within reach Nurse Communication: Mobility status   GP     Rada Hay 05/24/2012, 11:51 AM

## 2012-05-24 NOTE — Discharge Summary (Addendum)
Physician Discharge Summary  [name] QQV:[id number] DOB: 01/15/1925 DOA: 05/16/2012  PCP: [name], MD  Admit date: 05/16/2012 Discharge date: 05/24/2012  Recommendations for Outpatient Follow-up:  1. Pt will need to follow up with PCP in 2-3 weeks post discharge 2. Please obtain BMP to evaluate electrolytes and kidney function, also check potassium level  3. Please note that pt was given one dose of Kdur 40 MEQ prior to discharge but has not been on Kdur during the hospital stay as it was not indicated  4. Please also check CBC to evaluate Hg and Hct levels 5. Pt will need repeat xray right decubitus fil to assess right pleural effusion status   Discharge Diagnoses:  Principal Problem:  *Pleural effusion Active Problems:  Hypertensive heart disease  Pneumonia  Chronic respiratory failure with hypoxia  Pulmonary hypertension  Acute on chronic diastolic congestive heart failure  Mitral stenosis and regurgitation  Mitral regurgitation  Acute kidney injury  Upper back pain on right side  Discharge Condition: Stable  Diet recommendation: Heart healthy diet discussed in details   Brief narrative:  79 year old female with history off hypertension, stroke, pulmonary hypertension close admitted one month back with respiratory failure with a large right transudate pleural effusion which needs thoracentesis and discharged home on home oxygen returns with dyspnea on exertion and orthopnea with recurrent right pleural effusion.   Assessment/Plan:  Recurrent right pleural effusion  - s/p Rt thoracentesis 1/3 with 1.4 L fluid removed  - f/u CXR with worsening Rt sided pleural effusion, pt clinically stable and maintaining oxygen sats > 93% on 3 L  - d/w PCCM on call and recommendation is to observe clinically and if stable, repeat CXR in next week  - weight 167 --> 160 lbs this AM, approaching dry weight, good urine output  Acute on chronic kidney failure  - creatine slightly up from  yesterday, Lasix changed to PO BID and pt tolerating well  - BMP in AM prior to discharge  Hypertension  - reasonable control inpatient  Hypothyroidism  - Stable. continue Synthroid.  History of stroke  - Continue Aggrenox and Zocor  Chronic right upper back pain.  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 Smaller left effusion and basilar airspace disease have also increased.  XRAY 05/20/2012 --> Similar B pleural effusions. New interstitial edema, superimposed upon diminished lung volumes. Similar airspace disease, likely atelectasis.  Rt thoracentesis 05/17/2012 --> 1.4 L fluid removed Antibiotics:  None  Code Status: DNR  Family Communication: Pt at bedside  Disposition Plan: Discussed with daughter at length and she prefers to take pt home with her with PT to continue at home as medical insurance is not able to cover cost of SNF  Discharge Exam: Filed Vitals:   05/24/12 0604  BP: 116/68  Pulse: 78  Temp: 97.9 F (36.6 C)  Resp: 18   Filed Vitals:   05/23/12 2121 05/24/12 0604 05/24/12 1107 05/24/12 1108  BP: 118/70 116/68    Pulse: 68 78    Temp:  97.9 F (36.6 C)    TempSrc:  Oral    Resp:  18    Height:      Weight:  72.84 kg (160 lb 9.3 oz)    SpO2:  96% 77% 94%  General: Pt is alert, follows commands appropriately, not in acute distress Cardiovascular: Regular rate and rhythm, S1/S2 +, no rubs, no gallops Respiratory: Decreased breath sounds on the right side, rales at left and right base Abdominal: Soft, non tender, non distended, bowel sounds +, no guarding Extremities: no edema, no cyanosis, pulses palpable bilaterally DP and PT Neuro: Grossly nonfocal  Discharge Instructions  Discharge Orders    Future Orders Please  Complete By Expires   Diet - low sodium heart healthy      Increase activity slowly          Medication List     As of 05/24/2012 12:38 PM    TAKE these medications         acetaZOLAMIDE 250 MG tablet   Commonly known as: DIAMOX   Take 1 tablet (250 mg total) by mouth 2 (two) times daily.      beta carotene w/minerals tablet   Take 1 tablet by mouth daily.      calcium carbonate 1250 MG tablet   Commonly known as: OS-CAL - dosed in mg of elemental calcium   Take 1 tablet by mouth daily.      cholecalciferol 1000 UNITS tablet   Commonly known as: VITAMIN D   Take 1,000 Units by mouth daily.      dipyridamole-aspirin 200-25 MG per 12 hr capsule   Commonly known as: AGGRENOX   Take 1 capsule by mouth 2 (two) times daily.      folic acid 1 MG tablet   Commonly known as: FOLVITE   Take 1 mg by mouth daily.      furosemide 40 MG tablet   Commonly known as: LASIX   Take 1 tablet (40 mg total) by mouth 2 (two) times daily.      gabapentin 100 MG capsule   Commonly known as: NEURONTIN   Take 1 capsule (100 mg total) by mouth 3 (three) times daily as needed (pain).      glucosamine-chondroitin 500-400 MG tablet   Take 1 tablet by mouth 3 (three) times daily.      Iron 325 (65 FE) MG Tabs   Take 1 tablet by mouth daily.      levothyroxine 100 MCG tablet   Commonly known as: SYNTHROID, LEVOTHROID   Take 100 mcg by mouth daily.      lidocaine 5 %   Commonly known as: LIDODERM   Place 1 patch onto the skin daily. Remove & Discard patch within 12 hours or as directed by MD      methocarbamol 500 MG tablet   Commonly known as: ROBAXIN   Take 500 mg by mouth 3 (three) times daily as needed. Muscle spasms      metoprolol tartrate 12.5 mg Tabs   Commonly known as: LOPRESSOR   Take 0.5 tablets (12.5 mg total) by mouth 2 (two) times daily.      multivitamin with minerals Tabs   Take 1 tablet by mouth daily.      omeprazole 40 MG capsule   Commonly known as: PRILOSEC    Take 40 mg by mouth daily.      oxyCODONE-acetaminophen 5-325 MG per tablet   Commonly known as: PERCOCET/ROXICET   Take 1 tablet by mouth every 6 (six) hours as needed.      simvastatin 40 MG tablet   Commonly known as: ZOCOR   Take 40 mg by mouth every evening.      traMADol 50 MG tablet  Commonly known as: ULTRAM   Take 50 mg by mouth 3 (three) times daily. For pain relief           Follow-up Information    Follow up with [facility], MD. On 05/31/2012. (Appointment scheduled 8:45 AM January 17th, 2014)    Contact information:   [address] [facility] [place] [address] [phone]       Follow up with Darden Palmer, MD. On 05/27/2012.   Contact information:   [address] [place] [address] [phone]           The results of significant diagnostics from this hospitalization (including imaging, microbiology, ancillary and laboratory) are listed below for reference.     Microbiology: Recent Results (from the past 240 hour(s))  URINE CULTURE     Status: Normal   Collection Time   05/17/12  6:45 PM      Component Value Range Status Comment   Specimen Description URINE, CLEAN CATCH   Final    Special Requests NONE   Final    Culture  Setup Time 05/18/2012 02:29   Final    Colony Count NO GROWTH   Final    Culture NO GROWTH   Final    Report Status 05/19/2012 FINAL   Final      Labs: Basic Metabolic Panel:  Lab 05/24/12 5784 05/23/12 0520 05/22/12 0528 05/21/12 0525 05/20/12 0505  NA 136 137 140 139 139  K 3.9 4.6 4.1 4.0 3.9  CL 90* 90* 93* 90* 92*  CO2 40* 43* 45* 41* 39*  GLUCOSE 107* 111* 106* 87 95  BUN 46* 46* 46* 44* 43*  CREATININE 1.66* 1.57* 1.46* 1.40* 1.36*  CALCIUM 9.0 9.1 9.2 9.2 9.0  MG -- -- -- -- --  PHOS -- -- -- -- --   Liver Function Tests:  Lab 05/18/12 0516  AST 20  ALT 9  ALKPHOS 62  BILITOT 0.2*  PROT 6.3  ALBUMIN 2.7*   No results found for this basename:  LIPASE:5,AMYLASE:5 in the last 168 hours No results found for this basename: AMMONIA:5 in the last 168 hours CBC:  Lab 05/23/12 0520  WBC 4.9  NEUTROABS --  HGB 9.9*  HCT 32.7*  MCV 100.6*  PLT 157   Cardiac Enzymes: No results found for this basename: CKTOTAL:5,CKMB:5,CKMBINDEX:5,TROPONINI:5 in the last 168 hours BNP: BNP (last 3 results)  Basename 05/20/12 0505 05/17/12 1334 04/16/12 0518  PROBNP 2336.0* 1423.0* 4011.0*   CBG: No results found for this basename: GLUCAP:5 in the last 168 hours   SIGNED: Time coordinating discharge: Over 30 minutes  Debbora Presto, MD  [facility] 05/24/2012, 12:38 PM Pager [phone]  If 7PM-7AM, please contact night-coverage [email or web address] Password [email or web address]

## 2012-05-24 NOTE — Progress Notes (Signed)
Patient's daughter arrived at hospital ready to discharge her mother.  Explained to her that the discharged had been cancelled because of conversation Dr. Donnie Aho had with patient's daughter.  She told me she had not spoke with the doctor it must have been her stepsister and the plan was for her to take her mother home with her with assistance from home health which had already been arranged.  Paged Dr. Izola Price who spoke with patient's daughter to resolve this confusion.

## 2012-05-25 LAB — BASIC METABOLIC PANEL
Calcium: 9.4 mg/dL (ref 8.4–10.5)
GFR calc non Af Amer: 27 mL/min — ABNORMAL LOW (ref >90)
Glucose, Bld: 102 mg/dL — ABNORMAL HIGH (ref 70–99)
Potassium: 3.4 mEq/L — ABNORMAL LOW (ref 3.5–5.1)
Sodium: 138 mEq/L (ref 135–145)

## 2012-05-25 LAB — CBC
Hemoglobin: 10.2 g/dL — ABNORMAL LOW (ref 12.0–15.0)
MCH: 30.4 pg (ref 26.0–34.0)
Platelets: 213 10*3/uL (ref 150–400)
RBC: 3.36 MIL/uL — ABNORMAL LOW (ref 3.87–5.11)
WBC: 5.8 10*3/uL (ref 4.0–10.5)

## 2012-05-25 MED ORDER — POTASSIUM CHLORIDE CRYS ER 20 MEQ PO TBCR
40.0000 meq | EXTENDED_RELEASE_TABLET | Freq: Once | ORAL | Status: AC
  Administered 2012-05-25: 40 meq via ORAL
  Filled 2012-05-25: qty 2

## 2012-05-25 NOTE — Progress Notes (Signed)
Patient ID: [name], female   DOB: 08-Aug-1924, 79 y.o.   MRN: [id number]  [facility] PROGRESS NOTE  [name] [id number]:[id number] DOB: 1924/07/06 DOA: 05/16/2012 PCP: [name], MD  Brief narrative:  79 year old female with history off hypertension, stroke, pulmonary hypertension close admitted one month back with respiratory failure with a large right transudate pleural effusion which needs thoracentesis and discharged home on home oxygen returns with dyspnea on exertion and orthopnea with recurrent right pleural effusion.   Assessment/Plan:  Recurrent right pleural effusion  - s/p Rt thoracentesis 1/3 with 1.4 L fluid removed  - f/u CXR with worsening Rt sided pleural effusion, pt clinically stable and maintaining oxygen sats > 93% on 3 L  - d/w PCCM on call and recommendation is to observe clinically and if stable, repeat CXR in next week  - weight 167 --> 160 lbs this AM, good urine output  Acute on chronic kidney failure  - creatine appears to be stabilizing at ~ 1.5 -1.6 Hypertension  - reasonable control inpatient  Hypothyroidism  - Stable. continue Synthroid.  History of stroke  - Continue Aggrenox and Zocor  Chronic right upper back pain.  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 Smaller left effusion and basilar airspace disease have also increased.  XRAY 05/20/2012 --> Similar B pleural effusions. New interstitial edema, superimposed upon diminished lung volumes. Similar airspace disease, likely atelectasis.  Rt thoracentesis 05/17/2012 --> 1.4 L fluid removed Antibiotics:  None   HPI/Subjective: No events overnight. Pt reports feeling well this Am, denies chest pain or shortness of breath.    Objective: Filed Vitals:   05/24/12 1108 05/24/12 1459 05/24/12 2110 05/25/12 0600  BP:  128/72 122/64 108/59  Pulse:  74 82 71  Temp:  98 F (36.7 C) 97.7 F (36.5 C) 98 F (36.7 C)  TempSrc:  Oral Oral Oral  Resp:  16 17 18   Height:      Weight:    74.481 kg (164 lb 3.2 oz)  SpO2: 94% 96% 94% 97%    Intake/Output Summary (Last 24 hours) at 05/25/12 1050 Last data filed at 05/25/12 0850  Gross per 24 hour  Intake    240 ml  Output   1100 ml  Net   -860 ml    Exam:   General:  Pt is alert, follows commands appropriately, not in acute distress  Cardiovascular: Regular rate and rhythm, S1/S2, no murmurs, no rubs, no gallops  Respiratory: Decreased breath sounds on the right side, crackles at bases R> L  Abdomen: Soft, non tender, non distended, bowel sounds present, no guarding  Extremities: Trace bilateral pitting edema, pulses DP and PT palpable bilaterally  Neuro: Grossly nonfocal  Data Reviewed: Basic Metabolic Panel:  Lab 05/25/12 9562 05/24/12 0500 05/23/12 0520 05/22/12 0528 05/21/12 0525  NA 138 136 137 140 139  K 3.4* 3.9 4.6 4.1 4.0  CL 91* 90* 90* 93* 90*  CO2 38* 40* 43* 45* 41*  GLUCOSE 102* 107* 111* 106* 87  BUN 56* 46* 46* 46* 44*  CREATININE 1.65* 1.66* 1.57* 1.46* 1.40*  CALCIUM 9.4 9.0 9.1 9.2 9.2  MG -- -- -- -- --  PHOS -- -- -- -- --   Liver Function Tests: No  results found for this basename: AST:5,ALT:5,ALKPHOS:5,BILITOT:5,PROT:5,ALBUMIN:5 in the last 168 hours No results found for this basename: LIPASE:5,AMYLASE:5 in the last 168 hours No results found for this basename: AMMONIA:5 in the last 168 hours CBC:  Lab 05/25/12 0547 05/23/12 0520  WBC 5.8 4.9  NEUTROABS -- --  HGB 10.2* 9.9*  HCT 33.0* 32.7*  MCV 98.2 100.6*  PLT 213 157   Cardiac Enzymes: No results found for this basename: CKTOTAL:5,CKMB:5,CKMBINDEX:5,TROPONINI:5 in the last 168 hours BNP: No components found with this basename: POCBNP:5 CBG: No results  found for this basename: GLUCAP:5 in the last 168 hours  Recent Results (from the past 240 hour(s))  URINE CULTURE     Status: Normal   Collection Time   05/17/12  6:45 PM      Component Value Range Status Comment   Specimen Description URINE, CLEAN CATCH   Final    Special Requests NONE   Final    Culture  Setup Time 05/18/2012 02:29   Final    Colony Count NO GROWTH   Final    Culture NO GROWTH   Final    Report Status 05/19/2012 FINAL   Final      Scheduled Meds:   . capsaicin   Topical BID  . dipyridamole-aspirin  1 capsule Oral BID  . enoxaparin (LOVENOX) injection  30 mg Subcutaneous Q24H  . furosemide  40 mg Oral BID  . gabapentin  100 mg Oral TID  . levothyroxine  100 mcg Oral Daily  . lidocaine  1 patch Transdermal Q24H  . metoprolol tartrate  12.5 mg Oral BID  . pantoprazole  40 mg Oral Daily  . simvastatin  40 mg Oral Q2200  . sodium chloride  3 mL Intravenous Q12H   Continuous Infusions:    Debbora Presto, MD  [facility] Pager [phone]  If 7PM-7AM, please contact night-coverage [email or web address] Password [email or web address] 05/25/2012, 10:50 AM   LOS: 9 days

## 2012-05-25 NOTE — Care Management Note (Addendum)
    Page 1 of 1   05/25/2012     6:01:14 PM   CARE MANAGEMENT NOTE 05/25/2012  Patient:  [name], [name]   Account Number:  [id number]  Date Initiated:  05/20/2012  Documentation initiated by:  [facility]  Subjective/Objective Assessment:   79 year old female admitted with pleural effusion.     Action/Plan:   Pt is from home. Awaiting PT/OT evals to help determine d/c needs.   Anticipated DC Date:  05/25/2012   Anticipated DC Plan:  HOME W HOME HEALTH SERVICES      DC Planning Services  CM consult      Choice offered to / List presented to:  C-4 Adult Children        HH arranged  HH-1 RN  HH-2 PT  HH-4 NURSE'S AIDE      HH agency  [facility]   Status of service:  Completed, signed off Medicare Important Message given?   (If response is "NO", the following Medicare IM given date fields will be blank) Date Medicare IM given:   Date Additional Medicare IM given:    Discharge Disposition:  HOME W HOME HEALTH SERVICES  Per UR Regulation:    If discussed at Long Length of Stay Meetings, dates discussed:    Comments:  05/25/12 Isaly Fasching RN,BSN NCM [phone] [facility] JESSICA(LIASON) INFORMED OF HH ORDERS,& D/C.[facility] INFORMED OF WHERE PATIENT WILL D/C ZO:XWRUEAV WILL STAY W/DTR LINDA BARBER [address],[place] [address].H#[phone].

## 2012-05-28 ENCOUNTER — Emergency Department (HOSPITAL_COMMUNITY): Payer: Medicare Other

## 2012-05-28 ENCOUNTER — Ambulatory Visit: Payer: Medicare Other | Admitting: Internal Medicine

## 2012-05-28 ENCOUNTER — Encounter (HOSPITAL_COMMUNITY): Payer: Self-pay | Admitting: *Deleted

## 2012-05-28 ENCOUNTER — Inpatient Hospital Stay (HOSPITAL_COMMUNITY)
Admission: EM | Admit: 2012-05-28 | Discharge: 2012-06-03 | DRG: 291 | Disposition: A | Discharge status: Skilled Nursing Facility | Payer: Medicare Other | Attending: Internal Medicine | Admitting: Internal Medicine

## 2012-05-28 DIAGNOSIS — E559 Vitamin D deficiency, unspecified: Secondary | ICD-10-CM | POA: Diagnosis present

## 2012-05-28 DIAGNOSIS — I509 Heart failure, unspecified: Secondary | ICD-10-CM | POA: Diagnosis present

## 2012-05-28 DIAGNOSIS — J8410 Pulmonary fibrosis, unspecified: Secondary | ICD-10-CM | POA: Diagnosis present

## 2012-05-28 DIAGNOSIS — I2510 Atherosclerotic heart disease of native coronary artery without angina pectoris: Secondary | ICD-10-CM

## 2012-05-28 DIAGNOSIS — H3530 Unspecified macular degeneration: Secondary | ICD-10-CM | POA: Diagnosis present

## 2012-05-28 DIAGNOSIS — I272 Pulmonary hypertension, unspecified: Secondary | ICD-10-CM

## 2012-05-28 DIAGNOSIS — I5030 Unspecified diastolic (congestive) heart failure: Secondary | ICD-10-CM

## 2012-05-28 DIAGNOSIS — I5033 Acute on chronic diastolic (congestive) heart failure: Secondary | ICD-10-CM | POA: Diagnosis present

## 2012-05-28 DIAGNOSIS — N179 Acute kidney failure, unspecified: Secondary | ICD-10-CM | POA: Diagnosis present

## 2012-05-28 DIAGNOSIS — N189 Chronic kidney disease, unspecified: Principal | ICD-10-CM | POA: Diagnosis present

## 2012-05-28 DIAGNOSIS — J4489 Other specified chronic obstructive pulmonary disease: Secondary | ICD-10-CM | POA: Diagnosis present

## 2012-05-28 DIAGNOSIS — E039 Hypothyroidism, unspecified: Secondary | ICD-10-CM | POA: Diagnosis present

## 2012-05-28 DIAGNOSIS — Z8673 Personal history of transient ischemic attack (TIA), and cerebral infarction without residual deficits: Secondary | ICD-10-CM

## 2012-05-28 DIAGNOSIS — I2584 Coronary atherosclerosis due to calcified coronary lesion: Secondary | ICD-10-CM | POA: Diagnosis present

## 2012-05-28 DIAGNOSIS — N184 Chronic kidney disease, stage 4 (severe): Secondary | ICD-10-CM | POA: Diagnosis present

## 2012-05-28 DIAGNOSIS — Z87891 Personal history of nicotine dependence: Secondary | ICD-10-CM

## 2012-05-28 DIAGNOSIS — I4891 Unspecified atrial fibrillation: Secondary | ICD-10-CM

## 2012-05-28 DIAGNOSIS — J449 Chronic obstructive pulmonary disease, unspecified: Secondary | ICD-10-CM | POA: Diagnosis present

## 2012-05-28 DIAGNOSIS — R680 Hypothermia, not associated with low environmental temperature: Secondary | ICD-10-CM | POA: Diagnosis present

## 2012-05-28 DIAGNOSIS — I080 Rheumatic disorders of both mitral and aortic valves: Secondary | ICD-10-CM | POA: Diagnosis present

## 2012-05-28 DIAGNOSIS — H5440 Blindness, one eye, unspecified eye: Secondary | ICD-10-CM | POA: Diagnosis present

## 2012-05-28 DIAGNOSIS — R5381 Other malaise: Secondary | ICD-10-CM | POA: Diagnosis present

## 2012-05-28 DIAGNOSIS — I130 Hypertensive heart and chronic kidney disease with heart failure and stage 1 through stage 4 chronic kidney disease, or unspecified chronic kidney disease: Principal | ICD-10-CM | POA: Diagnosis present

## 2012-05-28 DIAGNOSIS — J9620 Acute and chronic respiratory failure, unspecified whether with hypoxia or hypercapnia: Secondary | ICD-10-CM | POA: Diagnosis present

## 2012-05-28 DIAGNOSIS — E860 Dehydration: Secondary | ICD-10-CM

## 2012-05-28 DIAGNOSIS — I050 Rheumatic mitral stenosis: Secondary | ICD-10-CM

## 2012-05-28 DIAGNOSIS — IMO0002 Reserved for concepts with insufficient information to code with codable children: Secondary | ICD-10-CM | POA: Diagnosis present

## 2012-05-28 DIAGNOSIS — J9611 Chronic respiratory failure with hypoxia: Secondary | ICD-10-CM

## 2012-05-28 DIAGNOSIS — Z79899 Other long term (current) drug therapy: Secondary | ICD-10-CM

## 2012-05-28 DIAGNOSIS — I2789 Other specified pulmonary heart diseases: Secondary | ICD-10-CM | POA: Diagnosis present

## 2012-05-28 DIAGNOSIS — J90 Pleural effusion, not elsewhere classified: Secondary | ICD-10-CM | POA: Diagnosis present

## 2012-05-28 DIAGNOSIS — N183 Chronic kidney disease, stage 3 unspecified: Secondary | ICD-10-CM | POA: Diagnosis present

## 2012-05-28 DIAGNOSIS — E785 Hyperlipidemia, unspecified: Secondary | ICD-10-CM

## 2012-05-28 DIAGNOSIS — B0229 Other postherpetic nervous system involvement: Secondary | ICD-10-CM | POA: Diagnosis present

## 2012-05-28 DIAGNOSIS — Z888 Allergy status to other drugs, medicaments and biological substances status: Secondary | ICD-10-CM

## 2012-05-28 DIAGNOSIS — R627 Adult failure to thrive: Secondary | ICD-10-CM | POA: Diagnosis present

## 2012-05-28 DIAGNOSIS — R0902 Hypoxemia: Secondary | ICD-10-CM

## 2012-05-28 DIAGNOSIS — G8929 Other chronic pain: Secondary | ICD-10-CM | POA: Diagnosis present

## 2012-05-28 DIAGNOSIS — M549 Dorsalgia, unspecified: Secondary | ICD-10-CM | POA: Diagnosis present

## 2012-05-28 DIAGNOSIS — R0600 Dyspnea, unspecified: Secondary | ICD-10-CM

## 2012-05-28 DIAGNOSIS — I5032 Chronic diastolic (congestive) heart failure: Secondary | ICD-10-CM

## 2012-05-28 DIAGNOSIS — R259 Unspecified abnormal involuntary movements: Secondary | ICD-10-CM | POA: Diagnosis present

## 2012-05-28 DIAGNOSIS — I119 Hypertensive heart disease without heart failure: Secondary | ICD-10-CM

## 2012-05-28 LAB — URINALYSIS, ROUTINE W REFLEX MICROSCOPIC
Glucose, UA: NEGATIVE mg/dL
Hgb urine dipstick: NEGATIVE
Ketones, ur: NEGATIVE mg/dL
Protein, ur: NEGATIVE mg/dL
pH: 7 (ref 5.0–8.0)

## 2012-05-28 LAB — POCT I-STAT, CHEM 8
BUN: 73 mg/dL — ABNORMAL HIGH (ref 6–23)
Calcium, Ion: 1.24 mmol/L (ref 1.13–1.30)
Chloride: 90 mEq/L — ABNORMAL LOW (ref 96–112)
Creatinine, Ser: 1.8 mg/dL — ABNORMAL HIGH (ref 0.50–1.10)
Glucose, Bld: 120 mg/dL — ABNORMAL HIGH (ref 70–99)
HCT: 33 % — ABNORMAL LOW (ref 36.0–46.0)
Hemoglobin: 11.2 g/dL — ABNORMAL LOW (ref 12.0–15.0)
Potassium: 4.3 meq/L (ref 3.5–5.1)
Sodium: 139 mEq/L (ref 135–145)
TCO2: 42 mmol/L (ref 0–100)

## 2012-05-28 LAB — CBC WITH DIFFERENTIAL/PLATELET
Basophils Absolute: 0 10*3/uL (ref 0.0–0.1)
Basophils Absolute: 0 10*3/uL (ref 0.0–0.1)
Basophils Relative: 1 % (ref 0–1)
Basophils Relative: 1 % (ref 0–1)
Eosinophils Absolute: 0.3 K/uL (ref 0.0–0.7)
Eosinophils Relative: 5 % (ref 0–5)
Eosinophils Relative: 5 % (ref 0–5)
HCT: 34.2 % — ABNORMAL LOW (ref 36.0–46.0)
HCT: 33.5 % — ABNORMAL LOW (ref 36.0–46.0)
Hemoglobin: 10.2 g/dL — ABNORMAL LOW (ref 12.0–15.0)
Hemoglobin: 10.3 g/dL — ABNORMAL LOW (ref 12.0–15.0)
Lymphocytes Relative: 10 % — ABNORMAL LOW (ref 12–46)
Lymphs Abs: 0.6 10*3/uL — ABNORMAL LOW (ref 0.7–4.0)
MCH: 30 pg (ref 26.0–34.0)
MCH: 30.9 pg (ref 26.0–34.0)
MCHC: 29.8 g/dL — ABNORMAL LOW (ref 30.0–36.0)
MCHC: 30.7 g/dL (ref 30.0–36.0)
MCV: 100.6 fL — ABNORMAL HIGH (ref 78.0–100.0)
MCV: 100.6 fL — ABNORMAL HIGH (ref 78.0–100.0)
Monocytes Absolute: 0.6 10*3/uL (ref 0.1–1.0)
Monocytes Absolute: 0.8 10*3/uL (ref 0.1–1.0)
Monocytes Relative: 9 % (ref 3–12)
Monocytes Relative: 12 % (ref 3–12)
Neutro Abs: 4.6 10*3/uL (ref 1.7–7.7)
Neutrophils Relative %: 76 % (ref 43–77)
Platelets: 251 K/uL (ref 150–400)
RBC: 3.4 MIL/uL — ABNORMAL LOW (ref 3.87–5.11)
RDW: 13 % (ref 11.5–15.5)
RDW: 13.1 % (ref 11.5–15.5)
WBC: 6.1 10*3/uL (ref 4.0–10.5)

## 2012-05-28 LAB — COMPREHENSIVE METABOLIC PANEL
AST: 21 U/L (ref 0–37)
BUN: 69 mg/dL — ABNORMAL HIGH (ref 6–23)
CO2: 37 mEq/L — ABNORMAL HIGH (ref 19–32)
Calcium: 9.9 mg/dL (ref 8.4–10.5)
Chloride: 93 mEq/L — ABNORMAL LOW (ref 96–112)
Creatinine, Ser: 1.63 mg/dL — ABNORMAL HIGH (ref 0.50–1.10)
GFR calc Af Amer: 32 mL/min — ABNORMAL LOW (ref >90)
GFR calc non Af Amer: 28 mL/min — ABNORMAL LOW (ref >90)
Glucose, Bld: 128 mg/dL — ABNORMAL HIGH (ref 70–99)
Total Bilirubin: 0.1 mg/dL — ABNORMAL LOW (ref 0.3–1.2)

## 2012-05-28 LAB — CG4 I-STAT (LACTIC ACID): Lactic Acid, Venous: 0.87 mmol/L (ref 0.5–2.2)

## 2012-05-28 LAB — URINE MICROSCOPIC-ADD ON

## 2012-05-28 LAB — PRO B NATRIURETIC PEPTIDE: Pro B Natriuretic peptide (BNP): 1884 pg/mL — ABNORMAL HIGH (ref 0–450)

## 2012-05-28 LAB — CBC
HCT: 30.6 % — ABNORMAL LOW (ref 36.0–46.0)
Hemoglobin: 9.5 g/dL — ABNORMAL LOW (ref 12.0–15.0)
MCH: 30.7 pg (ref 26.0–34.0)
MCHC: 31 g/dL (ref 30.0–36.0)

## 2012-05-28 LAB — POCT I-STAT TROPONIN I: Troponin i, poc: 0.01 ng/mL (ref 0.00–0.08)

## 2012-05-28 LAB — COMPREHENSIVE METABOLIC PANEL WITH GFR
ALT: 10 U/L (ref 0–35)
Albumin: 3 g/dL — ABNORMAL LOW (ref 3.5–5.2)
Alkaline Phosphatase: 78 U/L (ref 39–117)
Potassium: 4.5 meq/L (ref 3.5–5.1)
Sodium: 140 meq/L (ref 135–145)
Total Protein: 7.2 g/dL (ref 6.0–8.3)

## 2012-05-28 MED ORDER — LIDOCAINE 5 % EX PTCH
1.0000 | MEDICATED_PATCH | CUTANEOUS | Status: DC
  Administered 2012-05-28 – 2012-06-02 (×6): 1 via TRANSDERMAL
  Filled 2012-05-28 (×8): qty 1

## 2012-05-28 MED ORDER — SODIUM CHLORIDE 0.9 % IJ SOLN
3.0000 mL | INTRAMUSCULAR | Status: DC | PRN
  Administered 2012-06-02: 3 mL via INTRAVENOUS

## 2012-05-28 MED ORDER — IRON 325 (65 FE) MG PO TABS
1.0000 | ORAL_TABLET | Freq: Every day | ORAL | Status: DC
Start: 2012-05-28 — End: 2012-05-28

## 2012-05-28 MED ORDER — ENOXAPARIN SODIUM 30 MG/0.3ML ~~LOC~~ SOLN
30.0000 mg | SUBCUTANEOUS | Status: DC
  Administered 2012-05-28 – 2012-06-02 (×6): 30 mg via SUBCUTANEOUS
  Filled 2012-05-28 (×7): qty 0.3

## 2012-05-28 MED ORDER — FERROUS SULFATE 325 (65 FE) MG PO TABS
325.0000 mg | ORAL_TABLET | Freq: Every day | ORAL | Status: DC
  Administered 2012-05-29 – 2012-05-31 (×3): 325 mg via ORAL
  Filled 2012-05-28 (×5): qty 1

## 2012-05-28 MED ORDER — TRAMADOL HCL 50 MG PO TABS
50.0000 mg | ORAL_TABLET | Freq: Three times a day (TID) | ORAL | Status: DC
  Administered 2012-05-28 – 2012-06-03 (×17): 50 mg via ORAL
  Filled 2012-05-28 (×17): qty 1

## 2012-05-28 MED ORDER — OCUVITE PO TABS
1.0000 | ORAL_TABLET | Freq: Every day | ORAL | Status: DC
  Administered 2012-05-29 – 2012-06-03 (×6): 1 via ORAL
  Filled 2012-05-28 (×6): qty 1

## 2012-05-28 MED ORDER — HYDROCODONE-ACETAMINOPHEN 5-325 MG PO TABS
1.0000 | ORAL_TABLET | ORAL | Status: DC | PRN
  Administered 2012-05-29: 1 via ORAL
  Administered 2012-05-29: 2 via ORAL
  Administered 2012-05-29 (×2): 1 via ORAL
  Administered 2012-05-30 – 2012-05-31 (×5): 2 via ORAL
  Filled 2012-05-28: qty 1
  Filled 2012-05-28: qty 2
  Filled 2012-05-28: qty 1
  Filled 2012-05-28 (×5): qty 2
  Filled 2012-05-28: qty 1
  Filled 2012-05-28: qty 2

## 2012-05-28 MED ORDER — SODIUM CHLORIDE 0.9 % IJ SOLN
3.0000 mL | Freq: Two times a day (BID) | INTRAMUSCULAR | Status: DC
  Administered 2012-05-28 – 2012-06-03 (×9): 3 mL via INTRAVENOUS

## 2012-05-28 MED ORDER — FOLIC ACID 1 MG PO TABS
1.0000 mg | ORAL_TABLET | Freq: Every day | ORAL | Status: DC
  Administered 2012-05-29 – 2012-06-03 (×6): 1 mg via ORAL
  Filled 2012-05-28 (×6): qty 1

## 2012-05-28 MED ORDER — GABAPENTIN 100 MG PO CAPS
100.0000 mg | ORAL_CAPSULE | Freq: Three times a day (TID) | ORAL | Status: DC | PRN
  Administered 2012-05-29 – 2012-06-01 (×4): 100 mg via ORAL
  Filled 2012-05-28 (×4): qty 1

## 2012-05-28 MED ORDER — SODIUM CHLORIDE 0.9 % IJ SOLN
3.0000 mL | Freq: Two times a day (BID) | INTRAMUSCULAR | Status: DC
  Administered 2012-05-29 – 2012-06-02 (×2): 3 mL via INTRAVENOUS

## 2012-05-28 MED ORDER — VANCOMYCIN HCL IN DEXTROSE 1-5 GM/200ML-% IV SOLN
1000.0000 mg | Freq: Once | INTRAVENOUS | Status: AC
  Administered 2012-05-28: 1000 mg via INTRAVENOUS
  Filled 2012-05-28: qty 200

## 2012-05-28 MED ORDER — SODIUM CHLORIDE 0.9 % IV BOLUS (SEPSIS)
500.0000 mL | Freq: Once | INTRAVENOUS | Status: AC
  Administered 2012-05-28: 500 mL via INTRAVENOUS

## 2012-05-28 MED ORDER — LEVOFLOXACIN IN D5W 750 MG/150ML IV SOLN
750.0000 mg | INTRAVENOUS | Status: DC
  Filled 2012-05-28: qty 150

## 2012-05-28 MED ORDER — FUROSEMIDE 40 MG PO TABS
40.0000 mg | ORAL_TABLET | Freq: Two times a day (BID) | ORAL | Status: DC
  Administered 2012-05-29 – 2012-06-03 (×11): 40 mg via ORAL
  Filled 2012-05-28 (×15): qty 1

## 2012-05-28 MED ORDER — SODIUM CHLORIDE 0.9 % IV SOLN: INTRAVENOUS | Status: AC

## 2012-05-28 MED ORDER — PANTOPRAZOLE SODIUM 40 MG PO TBEC
40.0000 mg | DELAYED_RELEASE_TABLET | Freq: Every day | ORAL | Status: DC
  Administered 2012-05-28 – 2012-06-03 (×7): 40 mg via ORAL
  Filled 2012-05-28 (×6): qty 1

## 2012-05-28 MED ORDER — PIPERACILLIN-TAZOBACTAM 3.375 G IVPB
3.3750 g | Freq: Once | INTRAVENOUS | Status: DC
  Administered 2012-05-28: 3.375 g via INTRAVENOUS
  Filled 2012-05-28: qty 50

## 2012-05-28 MED ORDER — ACETAMINOPHEN 325 MG PO TABS: 650.0000 mg | ORAL_TABLET | Freq: Four times a day (QID) | ORAL | Status: DC | PRN

## 2012-05-28 MED ORDER — SODIUM CHLORIDE 0.9 % IV SOLN: 250.0000 mL | INTRAVENOUS | Status: DC | PRN

## 2012-05-28 MED ORDER — LEVOTHYROXINE SODIUM 100 MCG PO TABS
100.0000 ug | ORAL_TABLET | Freq: Every day | ORAL | Status: DC
  Administered 2012-05-28 – 2012-06-03 (×6): 100 ug via ORAL
  Filled 2012-05-28 (×7): qty 1

## 2012-05-28 MED ORDER — DOCUSATE SODIUM 100 MG PO CAPS
100.0000 mg | ORAL_CAPSULE | Freq: Two times a day (BID) | ORAL | Status: DC
  Administered 2012-05-28 – 2012-06-03 (×11): 100 mg via ORAL
  Filled 2012-05-28 (×14): qty 1

## 2012-05-28 MED ORDER — ASPIRIN-DIPYRIDAMOLE ER 25-200 MG PO CP12
1.0000 | ORAL_CAPSULE | Freq: Two times a day (BID) | ORAL | Status: DC
  Administered 2012-05-28 – 2012-06-03 (×12): 1 via ORAL
  Filled 2012-05-28 (×13): qty 1

## 2012-05-28 MED ORDER — ACETAMINOPHEN 325 MG PO TABS
650.0000 mg | ORAL_TABLET | Freq: Four times a day (QID) | ORAL | Status: DC | PRN
  Administered 2012-05-30: 650 mg via ORAL
  Filled 2012-05-28: qty 2

## 2012-05-28 MED ORDER — ADULT MULTIVITAMIN W/MINERALS CH
1.0000 | ORAL_TABLET | Freq: Every day | ORAL | Status: DC
  Administered 2012-05-29 – 2012-06-03 (×7): 1 via ORAL
  Filled 2012-05-28 (×6): qty 1

## 2012-05-28 MED ORDER — ONDANSETRON HCL 4 MG/2ML IJ SOLN: 4.0000 mg | Freq: Three times a day (TID) | INTRAMUSCULAR | Status: AC | PRN

## 2012-05-28 MED ORDER — ACETAMINOPHEN 650 MG RE SUPP: 650.0000 mg | Freq: Four times a day (QID) | RECTAL | Status: DC | PRN

## 2012-05-28 MED ORDER — ACETAZOLAMIDE 250 MG PO TABS
250.0000 mg | ORAL_TABLET | Freq: Two times a day (BID) | ORAL | Status: DC
  Administered 2012-05-28 – 2012-05-29 (×2): 250 mg via ORAL
  Filled 2012-05-28 (×3): qty 1

## 2012-05-28 MED ORDER — SIMVASTATIN 40 MG PO TABS
40.0000 mg | ORAL_TABLET | Freq: Every evening | ORAL | Status: DC
  Administered 2012-05-28 – 2012-05-31 (×4): 40 mg via ORAL
  Filled 2012-05-28 (×6): qty 1

## 2012-05-28 MED ORDER — CALCIUM CARBONATE 1250 (500 CA) MG PO TABS
1.0000 | ORAL_TABLET | Freq: Every day | ORAL | Status: DC
  Administered 2012-05-29 – 2012-06-03 (×6): 500 mg via ORAL
  Filled 2012-05-28 (×6): qty 1

## 2012-05-28 NOTE — ED Notes (Signed)
Pt unable to stand up for orthostatic vital signs.

## 2012-05-28 NOTE — ED Provider Notes (Addendum)
History     CSN: [id number]  Arrival date & time 05/28/12  6440   None     Chief Complaint  Patient presents with  . Weakness  . Tremors  . Back Pain    (Consider location/radiation/quality/duration/timing/severity/associated sxs/prior treatment) HPI Comments: 79 y.o. Female complaining of sudden onset weakness and tremors since this morning. Pt states she got up from bed to go to the bathroom, but was unable to ambulate because she was so weak. Her daughter and son-in-law with whom she lives helped her to the bathroom and then back to bed. Daughter was concerned and called EMS. Pt tried no interventions to help herself. Nothing made the weakness feel better or worse. She rates the weakness as severe and generalized. PMHx significant for stroke, CHF,   Patient is a 79 y.o. female presenting with weakness.  Weakness Primary symptoms do not include headaches, dizziness, fever, nausea or vomiting.  Additional symptoms include weakness. Additional symptoms do not include neck stiffness.    Past Medical History  Diagnosis Date  . Stroke   . Anemia   . Hyperlipidemia   . Vitamin D deficiency   . Hypothyroid   . Pulmonary hypertension   . Chronic respiratory failure with hypoxia   . Blindness of right eye     Macular degeneration  . Macular degeneration   . Hypertensive heart disease 04/16/2012    Past Surgical History  Procedure Date  . Cataract extraction, bilateral     Family History  Problem Relation Age of Onset  . Heart disease Mother   . Heart disease Father   . Heart disease Brother   . Breast cancer Daughter   . Lymphoma Brother     History  Substance Use Topics  . Smoking status: Former Smoker -- 0.5 packs/day for 50 years    Types: Cigarettes    Quit date: 05/15/1998  . Smokeless tobacco: Never Used  . Alcohol Use: No    OB History    Grav Para Term Preterm Abortions TAB SAB Ect Mult Living                  Review of Systems  Constitutional:  Negative for fever and diaphoresis.  HENT: Negative for neck pain and neck stiffness.   Eyes: Negative for visual disturbance.  Respiratory: Negative for apnea, chest tightness and shortness of breath.   Cardiovascular: Negative for chest pain and palpitations.  Gastrointestinal: Negative for nausea, vomiting, diarrhea and constipation.  Genitourinary: Negative for dysuria.  Musculoskeletal: Negative for gait problem.  Skin: Negative for rash.  Neurological: Positive for weakness. Negative for dizziness, light-headedness, numbness and headaches.  All other systems reviewed and are negative.    Allergies  Lisinopril  Home Medications   Current Outpatient Rx  Name  Route  Sig  Dispense  Refill  . ACETAZOLAMIDE 250 MG PO TABS   Oral   Take 250 mg by mouth 2 (two) times daily.         . OCUVITE PO TABS   Oral   Take 1 tablet by mouth daily.         Marland Kitchen CALCIUM CARBONATE 1250 MG PO TABS   Oral   Take 1 tablet by mouth daily.         Marland Kitchen VITAMIN D 1000 UNITS PO TABS   Oral   Take 1,000 Units by mouth daily.         . ASPIRIN-DIPYRIDAMOLE ER 25-200 MG PO CP12   Oral  Take 1 capsule by mouth 2 (two) times daily.         . IRON 325 (65 FE) MG PO TABS   Oral   Take 1 tablet by mouth daily.         Marland Kitchen FOLIC ACID 1 MG PO TABS   Oral   Take 1 mg by mouth daily.         . FUROSEMIDE 40 MG PO TABS   Oral   Take 40 mg by mouth 2 (two) times daily.         Marland Kitchen GABAPENTIN 100 MG PO CAPS   Oral   Take 100 mg by mouth 3 (three) times daily as needed.         Marland Kitchen GLUCOSAMINE-CHONDROITIN 500-400 MG PO TABS   Oral   Take 1 tablet by mouth 3 (three) times daily.         Marland Kitchen LEVOTHYROXINE SODIUM 100 MCG PO TABS   Oral   Take 100 mcg by mouth daily.         Marland Kitchen LIDOCAINE 5 % EX PTCH   Transdermal   Place 1 patch onto the skin daily. Remove & Discard patch within 12 hours or as directed by MD         . METHOCARBAMOL 500 MG PO TABS   Oral   Take 500 mg by mouth  3 (three) times daily as needed. Muscle spasms         . METOPROLOL TARTRATE 12.5 MG HALF TABLET   Oral   Take 12.5 mg by mouth 2 (two) times daily.         . ADULT MULTIVITAMIN W/MINERALS CH   Oral   Take 1 tablet by mouth daily.         Marland Kitchen OMEPRAZOLE 40 MG PO CPDR   Oral   Take 40 mg by mouth daily.         . OXYCODONE-ACETAMINOPHEN 5-325 MG PO TABS   Oral   Take 1 tablet by mouth every 6 (six) hours as needed. For pain         . SIMVASTATIN 40 MG PO TABS   Oral   Take 40 mg by mouth every evening.         Marland Kitchen TRAMADOL HCL 50 MG PO TABS   Oral   Take 50 mg by mouth 3 (three) times daily. For pain relief           BP 113/52  Pulse 79  Temp 97.4 F (36.3 C) (Oral)  Resp 12  SpO2 82%  Physical Exam  Nursing note and vitals reviewed. Constitutional: She is oriented to person, place, and time.       Pt is sick-looking, uncomfortable, lethargic  HENT:  Head: Normocephalic and atraumatic.  Eyes:       Pt is blind in right eye. Does not see well out of left. No reaction to light in right eye. Sluggish in left. EOM diminished  Neck: Normal range of motion. Neck supple.       No meningeal signs  Cardiovascular: Normal rate, regular rhythm and normal heart sounds.  Exam reveals no gallop and no friction rub.   No murmur heard. Pulmonary/Chest: Effort normal and breath sounds normal. No respiratory distress. She has no wheezes. She has no rales. She exhibits no tenderness.  Abdominal: Soft. Bowel sounds are normal. She exhibits no distension. There is no tenderness. There is no rebound and no guarding.  Musculoskeletal: Normal range of  motion. She exhibits no edema and no tenderness.       Strength is 5/5. FROM throughout.  Neurological: She is alert and oriented to person, place, and time. No cranial nerve deficit.  Skin: Skin is warm and dry. No rash noted.    ED Course  Procedures (including critical care time)   Results for orders placed during the  hospital encounter of 05/28/12  CBC WITH DIFFERENTIAL      Component Value Range   WBC 6.1  4.0 - 10.5 K/uL   RBC 3.40 (*) 3.87 - 5.11 MIL/uL   Hemoglobin 10.2 (*) 12.0 - 15.0 g/dL   HCT 16.1 (*) 09.6 - 04.5 %   MCV 100.6 (*) 78.0 - 100.0 fL   MCH 30.0  26.0 - 34.0 pg   MCHC 29.8 (*) 30.0 - 36.0 g/dL   RDW 40.9  81.1 - 91.4 %   Platelets 251  150 - 400 K/uL   Neutrophils Relative 76  43 - 77 %   Neutro Abs 4.6  1.7 - 7.7 K/uL   Lymphocytes Relative 10 (*) 12 - 46 %   Lymphs Abs 0.6 (*) 0.7 - 4.0 K/uL   Monocytes Relative 9  3 - 12 %   Monocytes Absolute 0.6  0.1 - 1.0 K/uL   Eosinophils Relative 5  0 - 5 %   Eosinophils Absolute 0.3  0.0 - 0.7 K/uL   Basophils Relative 1  0 - 1 %   Basophils Absolute 0.0  0.0 - 0.1 K/uL  COMPREHENSIVE METABOLIC PANEL      Component Value Range   Sodium 140  135 - 145 mEq/L   Potassium 4.5  3.5 - 5.1 mEq/L   Chloride 93 (*) 96 - 112 mEq/L   CO2 37 (*) 19 - 32 mEq/L   Glucose, Bld 128 (*) 70 - 99 mg/dL   BUN 69 (*) 6 - 23 mg/dL   Creatinine, Ser 7.82 (*) 0.50 - 1.10 mg/dL   Calcium 9.9  8.4 - 95.6 mg/dL   Total Protein 7.2  6.0 - 8.3 g/dL   Albumin 3.0 (*) 3.5 - 5.2 g/dL   AST 21  0 - 37 U/L   ALT 10  0 - 35 U/L   Alkaline Phosphatase 78  39 - 117 U/L   Total Bilirubin 0.1 (*) 0.3 - 1.2 mg/dL   GFR calc non Af Amer 28 (*) >90 mL/min   GFR calc Af Amer 32 (*) >90 mL/min  PRO B NATRIURETIC PEPTIDE      Component Value Range   Pro B Natriuretic peptide (BNP) 1884.0 (*) 0 - 450 pg/mL  CBC WITH DIFFERENTIAL      Component Value Range   WBC 6.5  4.0 - 10.5 K/uL   RBC 3.33 (*) 3.87 - 5.11 MIL/uL   Hemoglobin 10.3 (*) 12.0 - 15.0 g/dL   HCT 21.3 (*) 08.6 - 57.8 %   MCV 100.6 (*) 78.0 - 100.0 fL   MCH 30.9  26.0 - 34.0 pg   MCHC 30.7  30.0 - 36.0 g/dL   RDW 46.9  62.9 - 52.8 %   Platelets 253  150 - 400 K/uL   Neutrophils Relative 75  43 - 77 %   Neutro Abs 4.9  1.7 - 7.7 K/uL   Lymphocytes Relative 9 (*) 12 - 46 %   Lymphs Abs 0.6 (*)  0.7 - 4.0 K/uL   Monocytes Relative 12  3 - 12 %   Monocytes Absolute  0.8  0.1 - 1.0 K/uL   Eosinophils Relative 5  0 - 5 %   Eosinophils Absolute 0.3  0.0 - 0.7 K/uL   Basophils Relative 1  0 - 1 %   Basophils Absolute 0.0  0.0 - 0.1 K/uL   WBC Morphology VACUOLATED NEUTROPHILS     RBC Morphology ELLIPTOCYTES    URINALYSIS, ROUTINE W REFLEX MICROSCOPIC      Component Value Range   Color, Urine YELLOW  YELLOW   APPearance CLEAR  CLEAR   Specific Gravity, Urine 1.014  1.005 - 1.030   pH 7.0  5.0 - 8.0   Glucose, UA NEGATIVE  NEGATIVE mg/dL   Hgb urine dipstick NEGATIVE  NEGATIVE   Bilirubin Urine NEGATIVE  NEGATIVE   Ketones, ur NEGATIVE  NEGATIVE mg/dL   Protein, ur NEGATIVE  NEGATIVE mg/dL   Urobilinogen, UA 0.2  0.0 - 1.0 mg/dL   Nitrite NEGATIVE  NEGATIVE   Leukocytes, UA TRACE (*) NEGATIVE  POCT I-STAT, CHEM 8      Component Value Range   Sodium 139  135 - 145 mEq/L   Potassium 4.3  3.5 - 5.1 mEq/L   Chloride 90 (*) 96 - 112 mEq/L   BUN 73 (*) 6 - 23 mg/dL   Creatinine, Ser 4.09 (*) 0.50 - 1.10 mg/dL   Glucose, Bld 811 (*) 70 - 99 mg/dL   Calcium, Ion 9.14  7.82 - 1.30 mmol/L   TCO2 42  0 - 100 mmol/L   Hemoglobin 11.2 (*) 12.0 - 15.0 g/dL   HCT 95.6 (*) 21.3 - 08.6 %  POCT I-STAT TROPONIN I      Component Value Range   Troponin i, poc 0.01  0.00 - 0.08 ng/mL   Comment 3           URINE MICROSCOPIC-ADD ON      Component Value Range   Squamous Epithelial / LPF RARE  RARE   WBC, UA 0-2  <3 WBC/hpf   RBC / HPF 0-2  <3 RBC/hpf   Bacteria, UA RARE  RARE  CG4 I-STAT (LACTIC ACID)      Component Value Range   Lactic Acid, Venous 0.87  0.5 - 2.2 mmol/L   Dg Chest 2 View  05/28/2012  *RADIOLOGY REPORT*  Clinical Data: Shortness of breath, former smoker, recent right thoracentesis  CHEST - 2 VIEW  Comparison: 05/22/2012  Findings: Enlargement of cardiac silhouette with pulmonary vascular congestion. Atherosclerotic calcification aorta. Moderate right pleural effusion with  basilar atelectasis. Minimal effusion and atelectasis at left base. Underlying COPD. No pneumothorax. Bones demineralized.  IMPRESSION: Moderate right pleural effusion and basilar atelectasis. Changes of COPD with minimal left basilar atelectasis and small effusion. Enlargement of cardiac silhouette with pulmonary vascular congestion.   Original Report Authenticated By: Ulyses Southward, M.D.    Ct Head Wo Contrast  05/28/2012  *RADIOLOGY REPORT*  Clinical Data: Weakness, tremors, back pain  CT HEAD WITHOUT CONTRAST  Technique:  Contiguous axial images were obtained from the base of the skull through the vertex without contrast.  Comparison: CT 08/04/2011  Findings: Small chronic lacunar infarct right ventral thalamus. Negative for acute infarct.  Negative for hemorrhage or mass lesion.  No change from the prior study.  Atherosclerotic calcification in the carotid and vertebral arteries.  No acute skull abnormality.  Chronic atrophy of the right globe.  IMPRESSION: No acute intracranial abnormality.   Original Report Authenticated By: Janeece Riggers, M.D.     Date: 05/28/2012  Rate: 61  Rhythm: sinus tachycardia  QRS Axis: Normal axis  Intervals: PRI 188, QRS duration 114  ST/T Wave abnormalities: none  Conduction Disutrbances:nonspecific intraventricular conduction delay  Narrative Interpretation: abnormal EKG  Old EKG Reviewed: none available  Diagnosis: pleural effusion, dehydration, failure to thrive    MDM  Urinalysis negative. EKG showed normal sinus rhythm and trigeminy (pt has a hx of trigeminy). Troponins negative x1. Lactic acid normal. BNP elevated at 1884. No leukocytosis. CT of head shows no acute injury.  Chest xray shows pleural effusion and basilar atelectasis. Pt has recent hospital stay for pleural effusion as well where thoracocentesis removed 4L of fluid. Pt O2 on room air improved (82-99) after IV fluids. Consider possible hospital acquired pneumonia (treated presumtively with Zosyn  and Vanco)  Patient to be admitted for pleural effusion, dehydration, failure to thrive, and possibly rule out sepsis (due to concerning hypoxia, rectal temp of 96.6, but normal lactic acid). Blood cultures pending at end of shift. Pt care transferred at end of shift.   Glade Nurse, PA-C 05/28/12 1617  Glade Nurse, PA-C 05/28/12 1628  [place]ade Nurse, PA-C 05/28/12 1635

## 2012-05-28 NOTE — Progress Notes (Signed)
ANTIBIOTIC CONSULT NOTE - INITIAL  Pharmacy Consult for Levaquin Indication: pneumonia  Allergies  Allergen Reactions  . Lisinopril     pancreatitis    Patient Measurements:    Vital Signs: Temp: 97.6 F (36.4 C) (01/14 1728) Temp src: Oral (01/14 1728) BP: 114/49 mmHg (01/14 1700) Pulse Rate: 81  (01/14 1700) Intake/Output from previous day:   Intake/Output from this shift:    Labs:  Basename 05/28/12 1105 05/28/12 1103 05/28/12 0950  WBC -- 6.5 6.1  HGB 11.2* 10.3* 10.2*  PLT -- 253 251  LABCREA -- -- --  CREATININE 1.80* -- 1.63*   The CrCl is unknown because both a height and weight (above a minimum accepted value) are required for this calculation. No results found for this basename: VANCOTROUGH:2,VANCOPEAK:2,VANCORANDOM:2,GENTTROUGH:2,GENTPEAK:2,GENTRANDOM:2,TOBRATROUGH:2,TOBRAPEAK:2,TOBRARND:2,AMIKACINPEAK:2,AMIKACINTROU:2,AMIKACIN:2, in the last 72 hours   Microbiology: Recent Results (from the past 720 hour(s))  URINE CULTURE     Status: Normal   Collection Time   05/17/12  6:45 PM      Component Value Range Status Comment   Specimen Description URINE, CLEAN CATCH   Final    Special Requests NONE   Final    Culture  Setup Time 05/18/2012 02:29   Final    Colony Count NO GROWTH   Final    Culture NO GROWTH   Final    Report Status 05/19/2012 FINAL   Final     Medical History: Past Medical History  Diagnosis Date  . Stroke   . Anemia   . Hyperlipidemia   . Vitamin D deficiency   . Hypothyroid   . Pulmonary hypertension   . Chronic respiratory failure with hypoxia   . Blindness of right eye     Macular degeneration  . Macular degeneration   . Hypertensive heart disease 04/16/2012    Medications:  Anti-infectives     Start     Dose/Rate Route Frequency Ordered Stop   05/28/12 1500   piperacillin-tazobactam (ZOSYN) IVPB 3.375 g        3.375 g 12.5 mL/hr over 240 Minutes Intravenous  Once 05/28/12 1446     05/28/12 1500   vancomycin (VANCOCIN)  IVPB 1000 mg/200 mL premix        1,000 mg 200 mL/hr over 60 Minutes Intravenous  Once 05/28/12 1446 05/28/12 1721         Assessment: 79 year old female to begin empiric therapy with Levaquin for pneumonia.  Note she has renal insufficiency.  Goal of Therapy:  Eradication of infection  Plan:  Levaquin 750mg  IV q48h Monitor renal function and micro data  Estella Husk, Pharm.D., BCPS Clinical Pharmacist  Phone [phone] Pager [phone] 05/28/2012, 5:36 PM

## 2012-05-28 NOTE — ED Provider Notes (Signed)
Medical screening examination/treatment/procedure(s) were conducted as a shared visit with non-physician practitioner(s) and myself.  I personally evaluated the patient during the encounter  Patient presents to to generalized weakness, shortness of breath, came in hypoxic on room air. Agent recently admitted for pneumonia status post thoracentesis for a right-sided pleural effusion. It is unclear whether or not the patient was discharged on home oxygen or not. Placement here of nasal cannula O2, patient sats are now 98%. Her blood pressure has been marginal but stable. She is responded somewhat to IV fluid boluses here for presumed dehydration. She does have a elevated BUN in the 60s, up from her baseline of in the 40s with stable creatinine. Large right-sided pleural effusion remains which would explain her continued hypoxemia. In addition her rectal temperature was found to be low at 96.6, however her lactic acid is normal therefore does not meet sepsis criteria. Given her recent hospitalization, pleural effusion, I did write for Zosyn and faint for presumptive possible hospital-acquired pneumonia. I spoke with [facility] hospitalist to admit the patient for the above findings.  Gavin Pound. Dmetrius Ambs, MD 05/28/12 1623

## 2012-05-28 NOTE — H&P (Signed)
[facility] History and Physical  [name] HQI:[id number] DOB: [birth year] DOA: 05/28/2012  Referring physician: Dr. Oletta Lamas. PCP: [name], MD  Specialists: Dr. Donnie Aho.  Chief Complaint: Tremors, weakness.   HPI: [name] is a 79 y.o. female with PMH hearth failure, recurrent right side pleural effusion 2 times over last 2 months. Patient was brought to the ED by daughter today. Patient was not able to ambulate. Patient went to bathroom was sitting in the commode and was not able to stand up. Patient had an episode of jerking of arms and leg. No bowel or urine incontinence.  Patient was found to be hypoxic sat at 80 % in the ED, increase to 96 on 2 l. Per daughter no worsening of cough. Patient relates she is breathing better since she arrived to ED. Patient also received 500 cc bolus NS in the ED.   Review of Systems: The patient denies anorexia, fever, weight loss,, vision loss, decreased hearing, hoarseness, chest pain, syncope,  balance deficits, hemoptysis, abdominal pain, melena, hematochezia, severe indigestion/heartburn, hematuria, incontinence, genital sores, muscle weakness, suspicious skin lesions, transient blindness, , depression, unusual weight change, abnormal bleeding, enlarged lymph nodes.  Past Medical History  Diagnosis Date  . Stroke   . Anemia   . Hyperlipidemia   . Vitamin D deficiency   . Hypothyroid   . Pulmonary hypertension   . Chronic respiratory failure with hypoxia   . Blindness of right eye     Macular degeneration  . Macular degeneration   . Hypertensive heart disease 04/16/2012   Past Surgical History  Procedure Date  . Cataract extraction, bilateral    Social History:  reports that she quit smoking about 14 years ago. Her smoking use included Cigarettes. She has a 25 pack-year smoking history. She has never used smokeless tobacco. She reports that she does not drink alcohol or use illicit drugs. Lives with daughter.   Allergies  Allergen  Reactions  . Lisinopril     pancreatitis    Family History  Problem Relation Age of Onset  . Heart disease Mother   . Heart disease Father   . Heart disease Brother   . Breast cancer Daughter   . Lymphoma Brother     Prior to Admission medications   Medication Sig Start Date End Date Taking? Authorizing Provider  acetaZOLAMIDE (DIAMOX) 250 MG tablet Take 250 mg by mouth 2 (two) times daily. 05/24/12   Dorothea Ogle, MD  beta carotene w/minerals (OCUVITE) tablet Take 1 tablet by mouth daily.    Historical Provider, MD  calcium carbonate (OS-CAL - DOSED IN MG OF ELEMENTAL CALCIUM) 1250 MG tablet Take 1 tablet by mouth daily.    Historical Provider, MD  cholecalciferol (VITAMIN D) 1000 UNITS tablet Take 1,000 Units by mouth daily.    Historical Provider, MD  dipyridamole-aspirin (AGGRENOX) 25-200 MG per 12 hr capsule Take 1 capsule by mouth 2 (two) times daily.    Historical Provider, MD  Ferrous Sulfate (IRON) 325 (65 FE) MG TABS Take 1 tablet by mouth daily.    Historical Provider, MD  folic acid (FOLVITE) 1 MG tablet Take 1 mg by mouth daily.    Historical Provider, MD  furosemide (LASIX) 40 MG tablet Take 40 mg by mouth 2 (two) times daily. 05/24/12   Dorothea Ogle, MD  gabapentin (NEURONTIN) 100 MG capsule Take 100 mg by mouth 3 (three) times daily as needed. 05/24/12   Dorothea Ogle, MD  glucosamine-chondroitin 500-400 MG tablet  Take 1 tablet by mouth 3 (three) times daily.    Historical Provider, MD  levothyroxine (SYNTHROID, LEVOTHROID) 100 MCG tablet Take 100 mcg by mouth daily.    Historical Provider, MD  lidocaine (LIDODERM) 5 % Place 1 patch onto the skin daily. Remove & Discard patch within 12 hours or as directed by MD 05/24/12   Dorothea Ogle, MD  methocarbamol (ROBAXIN) 500 MG tablet Take 500 mg by mouth 3 (three) times daily as needed. Muscle spasms    Historical Provider, MD  metoprolol tartrate (LOPRESSOR) 12.5 mg TABS Take 12.5 mg by mouth 2 (two) times daily. 04/21/12    Hollice Espy, MD  Multiple Vitamin (MULITIVITAMIN WITH MINERALS) TABS Take 1 tablet by mouth daily.    Historical Provider, MD  omeprazole (PRILOSEC) 40 MG capsule Take 40 mg by mouth daily.    Historical Provider, MD  oxyCODONE-acetaminophen (PERCOCET/ROXICET) 5-325 MG per tablet Take 1 tablet by mouth every 6 (six) hours as needed. For pain 05/24/12   Dorothea Ogle, MD  simvastatin (ZOCOR) 40 MG tablet Take 40 mg by mouth every evening.    Historical Provider, MD  traMADol (ULTRAM) 50 MG tablet Take 50 mg by mouth 3 (three) times daily. For pain relief    Historical Provider, MD   Physical Exam: Filed Vitals:   05/28/12 1200 05/28/12 1202 05/28/12 1230 05/28/12 1315  BP: 106/55  108/36 109/45  Pulse: 81  79 78  Temp:  96.6 F (35.9 C)    TempSrc:  Rectal    Resp: 22     SpO2: 97%  99% 98%     General:  No distress, no accessory muscle use.   Eyes: PERLA.  ENT: No tonsillar enlargement, no erythema.   Neck: Supple, no thyromegaly.  Cardiovascular: S 1, S 2 RRR  Respiratory: Distant, no wheezes, cackles bases.   Abdomen: Bs, present, soft, NT  Skin: no rashes.  Musculoskeletal: no deformity.  Neurologic: Alert and awake, neuro exam non focal.   Labs on Admission:  Basic Metabolic Panel:  Lab 05/28/12 1610 05/28/12 0950 05/25/12 0547 05/24/12 0500 05/23/12 0520 05/22/12 0528  NA 139 140 138 136 137 --  K 4.3 4.5 3.4* 3.9 4.6 --  CL 90* 93* 91* 90* 90* --  CO2 -- 37* 38* 40* 43* 45*  GLUCOSE 120* 128* 102* 107* 111* --  BUN 73* 69* 56* 46* 46* --  CREATININE 1.80* 1.63* 1.65* 1.66* 1.57* --  CALCIUM -- 9.9 9.4 9.0 9.1 9.2  MG -- -- -- -- -- --  PHOS -- -- -- -- -- --   Liver Function Tests:  Lab 05/28/12 0950  AST 21  ALT 10  ALKPHOS 78  BILITOT 0.1*  PROT 7.2  ALBUMIN 3.0*  CBC:  Lab 05/28/12 1105 05/28/12 1103 05/28/12 0950 05/25/12 0547 05/23/12 0520  WBC -- 6.5 6.1 5.8 4.9  NEUTROABS -- 4.9 4.6 -- --  HGB 11.2* 10.3* 10.2* 10.2* 9.9*  HCT  33.0* 33.5* 34.2* 33.0* 32.7*  MCV -- 100.6* 100.6* 98.2 100.6*  PLT -- 253 251 213 157   Cardiac Enzymes: No results found for this basename: CKTOTAL:5,CKMB:5,CKMBINDEX:5,TROPONINI:5 in the last 168 hours  BNP (last 3 results)  Basename 05/28/12 0950 05/20/12 0505 05/17/12 1334  PROBNP 1884.0* 2336.0* 1423.0*   CBG: No results found for this basename: GLUCAP:5 in the last 168 hours  Radiological Exams on Admission: Dg Chest 2 View  05/28/2012  *RADIOLOGY REPORT*  Clinical Data: Shortness of breath, former  smoker, recent right thoracentesis  CHEST - 2 VIEW  Comparison: 05/22/2012  Findings: Enlargement of cardiac silhouette with pulmonary vascular congestion. Atherosclerotic calcification aorta. Moderate right pleural effusion with basilar atelectasis. Minimal effusion and atelectasis at left base. Underlying COPD. No pneumothorax. Bones demineralized.  IMPRESSION: Moderate right pleural effusion and basilar atelectasis. Changes of COPD with minimal left basilar atelectasis and small effusion. Enlargement of cardiac silhouette with pulmonary vascular congestion.   Original Report Authenticated By: Ulyses Southward, M.D.    Ct Head Wo Contrast  05/28/2012  *RADIOLOGY REPORT*  Clinical Data: Weakness, tremors, back pain  CT HEAD WITHOUT CONTRAST  Technique:  Contiguous axial images were obtained from the base of the skull through the vertex without contrast.  Comparison: CT 08/04/2011  Findings: Small chronic lacunar infarct right ventral thalamus. Negative for acute infarct.  Negative for hemorrhage or mass lesion.  No change from the prior study.  Atherosclerotic calcification in the carotid and vertebral arteries.  No acute skull abnormality.  Chronic atrophy of the right globe.  IMPRESSION: No acute intracranial abnormality.   Original Report Authenticated By: Janeece Riggers, M.D.     EKG: Independently reviewed. Sinus rhythm, trigeminy.   Assessment/Plan Active Problems:  Hypothyroidism   Pneumonia  Acute on chronic diastolic congestive heart failure  Acute kidney injury  Tremors of nervous system  Failure to thrive   1. Tremors/ Weakness; Probably related to hypoxemia, deconditioning. I will order TSH. Patient will need PT. No evidence of seizure. CT head negative.  2. Dyspnea/Hypoxemia: Probably related to right side pleural effusion, component of Hearth Failure. I will continue with lasix. She will need repeat Chest x-ray.  I will Cover for PNA with Levaquin although no leukocytosis, no fever. Might benefit of pulmonary consult.  3.   Acute on chronic Diastolic HF, mild: I will continue with home dose of lasix. Monitor renal function. Hold BB due to hypotension. Will probably need to resume this medication.  4.   Acute on chronic renal failure: UA negative for protein. Cr. goes from 1.6 to 1.8 per record. Monitor renal function on lasix. I             would avoid IV fluids.  5.   Hypothyroidism: Continue with Synthroid.  6.   Hypothermia: Resolved. Lactic acid normal. Treating empirically for PNA. 7.   Chronic Back pain: if no improvement could consider X ray. Continue with gabapentin and pain medications.     Code Status: Partial Code, do not intubate.  Family Communication: Spoke with daughter, she confirmed partial code. No intubation. Plan of care discussed with the daughter. Disposition Plan: Inpatient admission expect 3-4 day.  Time spent: more than 60 minutes.   Kynlei Piontek [facility] Pager [phone]  If 7PM-7AM, please contact night-coverage [email or web address] Password [facility] 05/28/2012, 5:12 PM

## 2012-05-28 NOTE — ED Notes (Signed)
Patient with post herpatic neuralgia to her back.  She had weakness and tremors today that lasted for only 20 min.  Patient did not want to come to Ed but daughter requested transport.  Patient with noted irregular ekg.  Trigeminy on the monitor.  Patient denies any chest pain.  Denies sob. Patient cbg 145.  Patient vss.

## 2012-05-28 NOTE — ED Notes (Signed)
Results of lactic acid shown to Dr. Ghim 

## 2012-05-29 DIAGNOSIS — I5032 Chronic diastolic (congestive) heart failure: Secondary | ICD-10-CM

## 2012-05-29 DIAGNOSIS — R0989 Other specified symptoms and signs involving the circulatory and respiratory systems: Secondary | ICD-10-CM

## 2012-05-29 DIAGNOSIS — R0609 Other forms of dyspnea: Secondary | ICD-10-CM

## 2012-05-29 DIAGNOSIS — J90 Pleural effusion, not elsewhere classified: Secondary | ICD-10-CM

## 2012-05-29 DIAGNOSIS — R0902 Hypoxemia: Secondary | ICD-10-CM

## 2012-05-29 DIAGNOSIS — I509 Heart failure, unspecified: Secondary | ICD-10-CM

## 2012-05-29 DIAGNOSIS — B0229 Other postherpetic nervous system involvement: Secondary | ICD-10-CM | POA: Diagnosis present

## 2012-05-29 DIAGNOSIS — I5033 Acute on chronic diastolic (congestive) heart failure: Secondary | ICD-10-CM

## 2012-05-29 DIAGNOSIS — J9610 Chronic respiratory failure, unspecified whether with hypoxia or hypercapnia: Secondary | ICD-10-CM

## 2012-05-29 DIAGNOSIS — N184 Chronic kidney disease, stage 4 (severe): Secondary | ICD-10-CM | POA: Diagnosis present

## 2012-05-29 LAB — CBC
HCT: 31.3 % — ABNORMAL LOW (ref 36.0–46.0)
MCV: 98.7 fL (ref 78.0–100.0)
Platelets: 233 10*3/uL (ref 150–400)
RBC: 3.17 MIL/uL — ABNORMAL LOW (ref 3.87–5.11)
WBC: 5.8 10*3/uL (ref 4.0–10.5)

## 2012-05-29 LAB — COMPREHENSIVE METABOLIC PANEL
AST: 18 U/L (ref 0–37)
Alkaline Phosphatase: 73 U/L (ref 39–117)
BUN: 60 mg/dL — ABNORMAL HIGH (ref 6–23)
CO2: 38 mEq/L — ABNORMAL HIGH (ref 19–32)
Chloride: 95 mEq/L — ABNORMAL LOW (ref 96–112)
Creatinine, Ser: 1.34 mg/dL — ABNORMAL HIGH (ref 0.50–1.10)
GFR calc non Af Amer: 35 mL/min — ABNORMAL LOW (ref >90)
Potassium: 4.1 mEq/L (ref 3.5–5.1)
Total Bilirubin: 0.2 mg/dL — ABNORMAL LOW (ref 0.3–1.2)

## 2012-05-29 MED ORDER — ENSURE COMPLETE PO LIQD
237.0000 mL | Freq: Two times a day (BID) | ORAL | Status: DC
  Administered 2012-05-29 – 2012-06-03 (×8): 237 mL via ORAL

## 2012-05-29 NOTE — Consult Note (Signed)
PULMONARY  / CRITICAL CARE MEDICINE  Name: [name] MRN: [id number] DOB: 1924-10-22    LOS: 1  REFERRING PROVIDER:  Dr. Donnie Aho  CHIEF COMPLAINT:  SOB  BRIEF PATIENT DESCRIPTION: 79 year old female with PMH of CHF who presents to the hospital on 1/14 by her daughter due to inability to ambulate out of the bathroom accompanied by jerking of arms.  Patient was found to be hypoxic in the ED with sat of 80% on RA that improved to 96% on 2L [place].  Denies any worsening of cough, fever, chills, chest pain on DOE.  LINES / TUBES: PIV  CULTURES: Blood 1/14>>>NTD  ANTIBIOTICS: None  SIGNIFICANT EVENTS:  Desaturation 1/14 in the ED.  PAST MEDICAL HISTORY :  Past Medical History  Diagnosis Date  . Stroke   . Anemia   . Hyperlipidemia   . Vitamin D deficiency   . Hypothyroid   . Pulmonary hypertension   . Chronic respiratory failure with hypoxia   . Blindness of right eye     Macular degeneration  . Macular degeneration   . Hypertensive heart disease 04/16/2012   Past Surgical History  Procedure Date  . Cataract extraction, bilateral    Prior to Admission medications   Medication Sig Start Date End Date Taking? Authorizing Provider  acetaZOLAMIDE (DIAMOX) 250 MG tablet Take 250 mg by mouth 2 (two) times daily. 05/24/12   Dorothea Ogle, MD  beta carotene w/minerals (OCUVITE) tablet Take 1 tablet by mouth daily.    Historical Provider, MD  calcium carbonate (OS-CAL - DOSED IN MG OF ELEMENTAL CALCIUM) 1250 MG tablet Take 1 tablet by mouth daily.    Historical Provider, MD  cholecalciferol (VITAMIN D) 1000 UNITS tablet Take 1,000 Units by mouth daily.    Historical Provider, MD  dipyridamole-aspirin (AGGRENOX) 25-200 MG per 12 hr capsule Take 1 capsule by mouth 2 (two) times daily.    Historical Provider, MD  Ferrous Sulfate (IRON) 325 (65 FE) MG TABS Take 1 tablet by mouth daily.    Historical Provider, MD  folic acid (FOLVITE) 1 MG tablet Take 1 mg by mouth daily.    Historical  Provider, MD  furosemide (LASIX) 40 MG tablet Take 40 mg by mouth 2 (two) times daily. 05/24/12   Dorothea Ogle, MD  gabapentin (NEURONTIN) 100 MG capsule Take 100 mg by mouth 3 (three) times daily as needed. 05/24/12   Dorothea Ogle, MD  glucosamine-chondroitin 500-400 MG tablet Take 1 tablet by mouth 3 (three) times daily.    Historical Provider, MD  levothyroxine (SYNTHROID, LEVOTHROID) 100 MCG tablet Take 100 mcg by mouth daily.    Historical Provider, MD  lidocaine (LIDODERM) 5 % Place 1 patch onto the skin daily. Remove & Discard patch within 12 hours or as directed by MD 05/24/12   Dorothea Ogle, MD  methocarbamol (ROBAXIN) 500 MG tablet Take 500 mg by mouth 3 (three) times daily as needed. Muscle spasms    Historical Provider, MD  metoprolol tartrate (LOPRESSOR) 12.5 mg TABS Take 12.5 mg by mouth 2 (two) times daily. 04/21/12   Hollice Espy, MD  Multiple Vitamin (MULITIVITAMIN WITH MINERALS) TABS Take 1 tablet by mouth daily.    Historical Provider, MD  omeprazole (PRILOSEC) 40 MG capsule Take 40 mg by mouth daily.    Historical Provider, MD  oxyCODONE-acetaminophen (PERCOCET/ROXICET) 5-325 MG per tablet Take 1 tablet by mouth every 6 (six) hours as needed. For pain 05/24/12   Trevor Iha  Izola Price, MD  simvastatin (ZOCOR) 40 MG tablet Take 40 mg by mouth every evening.    Historical Provider, MD  traMADol (ULTRAM) 50 MG tablet Take 50 mg by mouth 3 (three) times daily. For pain relief    Historical Provider, MD   Allergies  Allergen Reactions  . Lisinopril     pancreatitis    FAMILY HISTORY:  Family History  Problem Relation Age of Onset  . Heart disease Mother   . Heart disease Father   . Heart disease Brother   . Breast cancer Daughter   . Lymphoma Brother    SOCIAL HISTORY:  reports that she quit smoking about 14 years ago. Her smoking use included Cigarettes. She has a 25 pack-year smoking history. She has never used smokeless tobacco. She reports that she does not drink alcohol or  use illicit drugs.  REVIEW OF SYSTEMS:   12 point ROS is negative other than mentioned above.  INTERVAL HISTORY:   VITAL SIGNS: Temp:  [96.6 F (35.9 C)-97.6 F (36.4 C)] 97.4 F (36.3 C) (01/15 0454) Pulse Rate:  [73-81] 73  (01/15 0454) Resp:  [18-22] 18  (01/15 0454) BP: (106-119)/(36-55) 113/53 mmHg (01/15 0454) SpO2:  [96 %-99 %] 97 % (01/15 0454) Weight:  [75.6 kg (166 lb 10.7 oz)-76.1 kg (167 lb 12.3 oz)] 76.1 kg (167 lb 12.3 oz) (01/15 0454)  PHYSICAL EXAMINATION: General:  Chronically ill appearing female in NAD. Neuro:  Alert and interactive, moves ext to command. HEENT:  [place]/AT, PERRL, EOM-I and MMM. Neck:  +JVD but otherwise negative. Cardiovascular:  RRR, Nl S1/S2, -M/R/G. Lungs:  Decrease BS on the right will dullness to percussion on the right. Abdomen:  Soft, NT, ND and +BS. Musculoskeletal:  1+ edema and -tenderness. Skin:  Thin but intact.   Lab 05/29/12 0608 05/28/12 2115 05/28/12 1105 05/28/12 0950 05/25/12 0547  NA 139 -- 139 140 --  K 4.1 -- 4.3 4.5 --  CL 95* -- 90* 93* --  CO2 38* -- -- 37* 38*  BUN 60* -- 73* 69* --  CREATININE 1.34* 1.44* 1.80* -- --  GLUCOSE 105* -- 120* 128* --    Lab 05/29/12 0608 05/28/12 2115 05/28/12 1105 05/28/12 1103  HGB 9.7* 9.5* 11.2* --  HCT 31.3* 30.6* 33.0* --  WBC 5.8 5.5 -- 6.5  PLT 233 237 -- 253   Dg Chest 2 View  05/28/2012  *RADIOLOGY REPORT*  Clinical Data: Shortness of breath, former smoker, recent right thoracentesis  CHEST - 2 VIEW  Comparison: 05/22/2012  Findings: Enlargement of cardiac silhouette with pulmonary vascular congestion. Atherosclerotic calcification aorta. Moderate right pleural effusion with basilar atelectasis. Minimal effusion and atelectasis at left base. Underlying COPD. No pneumothorax. Bones demineralized.  IMPRESSION: Moderate right pleural effusion and basilar atelectasis. Changes of COPD with minimal left basilar atelectasis and small effusion. Enlargement of cardiac silhouette  with pulmonary vascular congestion.   Original Report Authenticated By: Ulyses Southward, M.D.    Ct Head Wo Contrast  05/28/2012  *RADIOLOGY REPORT*  Clinical Data: Weakness, tremors, back pain  CT HEAD WITHOUT CONTRAST  Technique:  Contiguous axial images were obtained from the base of the skull through the vertex without contrast.  Comparison: CT 08/04/2011  Findings: Small chronic lacunar infarct right ventral thalamus. Negative for acute infarct.  Negative for hemorrhage or mass lesion.  No change from the prior study.  Atherosclerotic calcification in the carotid and vertebral arteries.  No acute skull abnormality.  Chronic atrophy of the right globe.  IMPRESSION: No acute intracranial abnormality.   Original Report Authenticated By: Janeece Riggers, M.D.     ASSESSMENT / PLAN:  79 year old with history of CHF presenting to the hospital with desaturation likely related to heart failure.  PCCM called on consultation for pleural effusion.  The patient had a previous episode on 12/5 and a thora was performed that confirmed transudative effusion by Light's criteria.  The patient is actively being diuresed and according to cards is approaching dry weight.  Spirometry was reviewed that showed significant restrictive lung disease consistent with history. Plan: Ambulatory desaturation study on 2L to evaluate if 2L O2 at home is adequate.  No indication for additional thora at this time specially that previous was transudative indicating heart failure as culprit, further thora will only result in more loss of protein and more recurrence of the pleural effusion.  Diureses as tolerated (renal function and BP allows).  Supplemental O2.  Cardiopulmonary rehab as tolerated.  Physical therapy.  Code status is addressed with no intubation but otherwise full code, would address that further and involve hospice if unable to control symptoms.  PCCM will follow with you with a CXR in AM, if fails to show signs of  improvement will reconsider thora but doubtful.  Alyson Reedy, M.D. Pulmonary and Critical Care Medicine [facility] Pager: [phone]  05/29/2012, 11:35 AM

## 2012-05-29 NOTE — Progress Notes (Addendum)
[facility]             Progress Note   Subjective: No current complaints. Back pain is currently well controlled.  Objective: Vital signs in last 24 hours: Temp:  [96.6 F (35.9 C)-97.6 F (36.4 C)] 97.4 F (36.3 C) (01/15 0454) Pulse Rate:  [73-81] 73  (01/15 0454) Resp:  [12-22] 18  (01/15 0454) BP: (106-119)/(36-55) 113/53 mmHg (01/15 0454) SpO2:  [82 %-99 %] 97 % (01/15 0454) Weight:  [75.6 kg (166 lb 10.7 oz)-76.1 kg (167 lb 12.3 oz)] 76.1 kg (167 lb 12.3 oz) (01/15 0454) Weight change:  Last BM Date: 05/26/12  Intake/Output from previous day:       Physical Exam: General: Alert, awake, oriented x3, in no acute distress. HEENT: No bruits, no goiter. Heart: Regular rate and rhythm, without murmurs, rubs, gallops. Lungs: Decreased breath sounds right base. Abdomen: Soft, nontender, nondistended, positive bowel sounds. Extremities: Trace pedal edema, +pedal pulses. Neuro: Grossly intact, nonfocal. I have not ambulated her.    Lab Results: Basic Metabolic Panel:  Basename 05/29/12 1610 05/28/12 2115 05/28/12 1105 05/28/12 0950  NA 139 -- 139 --  K 4.1 -- 4.3 --  CL 95* -- 90* --  CO2 38* -- -- 37*  GLUCOSE 105* -- 120* --  BUN 60* -- 73* --  CREATININE 1.34* 1.44* -- --  CALCIUM 9.5 -- -- 9.9  MG -- -- -- --  PHOS -- -- -- --   Liver Function Tests:  [facility] 05/29/12 0608 05/28/12 0950  AST 18 21  ALT 8 10  ALKPHOS 73 78  BILITOT 0.2* 0.1*  PROT 6.6 7.2  ALBUMIN 2.8* 3.0*   CBC:  Basename 05/29/12 0608 05/28/12 2115 05/28/12 1103 05/28/12 0950  WBC 5.8 5.5 -- --  NEUTROABS -- -- 4.9 4.6  HGB 9.7* 9.5* -- --  HCT 31.3* 30.6* -- --  MCV 98.7 99.0 -- --  PLT 233 237 -- --   BNP:  Basename 05/28/12 0950  PROBNP 1884.0*   Thyroid Function Tests:  Basename 05/28/12 2115  TSH 0.867  T4TOTAL --  FREET4 --  T3FREE --  THYROIDAB --   Urinalysis:  Basename 05/28/12 1206  COLORURINE YELLOW  LABSPEC 1.014  PHURINE 7.0    GLUCOSEU NEGATIVE  HGBUR NEGATIVE  BILIRUBINUR NEGATIVE  KETONESUR NEGATIVE  PROTEINUR NEGATIVE  UROBILINOGEN 0.2  NITRITE NEGATIVE  LEUKOCYTESUR TRACE*    Recent Results (from the past 240 hour(s))  CULTURE, BLOOD (ROUTINE X 2)     Status: Normal (Preliminary result)   Collection Time   05/28/12 12:55 PM      Component Value Range Status Comment   Specimen Description BLOOD RIGHT ANTECUBITAL   Final    Special Requests BOTTLES DRAWN AEROBIC AND ANAEROBIC 10CC   Final    Culture  Setup Time 05/28/2012 19:22   Final    Culture     Final    Value:        BLOOD CULTURE RECEIVED NO GROWTH TO DATE CULTURE WILL BE HELD FOR 5 DAYS BEFORE ISSUING A FINAL NEGATIVE REPORT   Report Status PENDING   Incomplete   CULTURE, BLOOD (ROUTINE X 2)     Status: Normal (Preliminary result)   Collection Time   05/28/12  1:05 PM      Component Value Range Status Comment   Specimen Description BLOOD HAND RIGHT   Final    Special Requests BOTTLES DRAWN AEROBIC ONLY 10CC   Final    Culture  Setup Time 05/28/2012 19:23   Final    Culture     Final    Value:        BLOOD CULTURE RECEIVED NO GROWTH TO DATE CULTURE WILL BE HELD FOR 5 DAYS BEFORE ISSUING A FINAL NEGATIVE REPORT   Report Status PENDING   Incomplete     Studies/Results: Dg Chest 2 View  05/28/2012  *RADIOLOGY REPORT*  Clinical Data: Shortness of breath, former smoker, recent right thoracentesis  CHEST - 2 VIEW  Comparison: 05/22/2012  Findings: Enlargement of cardiac silhouette with pulmonary vascular congestion. Atherosclerotic calcification aorta. Moderate right pleural effusion with basilar atelectasis. Minimal effusion and atelectasis at left base. Underlying COPD. No pneumothorax. Bones demineralized.  IMPRESSION: Moderate right pleural effusion and basilar atelectasis. Changes of COPD with minimal left basilar atelectasis and small effusion. Enlargement of cardiac silhouette with pulmonary vascular congestion.   Original Report Authenticated  By: Ulyses Southward, M.D.    Ct Head Wo Contrast  05/28/2012  *RADIOLOGY REPORT*  Clinical Data: Weakness, tremors, back pain  CT HEAD WITHOUT CONTRAST  Technique:  Contiguous axial images were obtained from the base of the skull through the vertex without contrast.  Comparison: CT 08/04/2011  Findings: Small chronic lacunar infarct right ventral thalamus. Negative for acute infarct.  Negative for hemorrhage or mass lesion.  No change from the prior study.  Atherosclerotic calcification in the carotid and vertebral arteries.  No acute skull abnormality.  Chronic atrophy of the right globe.  IMPRESSION: No acute intracranial abnormality.   Original Report Authenticated By: Janeece Riggers, M.D.     Medications: Scheduled Meds:    . sodium chloride   Intravenous STAT  . acetaZOLAMIDE  250 mg Oral BID  . beta carotene w/minerals  1 tablet Oral Daily  . calcium carbonate  1 tablet Oral Daily  . dipyridamole-aspirin  1 capsule Oral BID  . docusate sodium  100 mg Oral BID  . enoxaparin (LOVENOX) injection  30 mg Subcutaneous Q24H  . ferrous sulfate  325 mg Oral Q breakfast  . folic acid  1 mg Oral Daily  . furosemide  40 mg Oral BID  . levothyroxine  100 mcg Oral QAC breakfast  . lidocaine  1 patch Transdermal Q24H  . multivitamin with minerals  1 tablet Oral Daily  . pantoprazole  40 mg Oral Daily  . simvastatin  40 mg Oral QPM  . sodium chloride  3 mL Intravenous Q12H  . sodium chloride  3 mL Intravenous Q12H  . traMADol  50 mg Oral TID   Continuous Infusions:  PRN Meds:.sodium chloride, acetaminophen, acetaminophen, gabapentin, HYDROcodone-acetaminophen, sodium chloride  Assessment/Plan:  Principal Problem:  *Acute on chronic diastolic congestive heart failure Active Problems:  Hypothyroidism  Pleural effusion  Chronic respiratory failure with hypoxia  ARF (acute renal failure)  Failure to thrive  CKD (chronic kidney disease) stage 3, GFR 30-59 ml/min  Post zoster  neuralgia    Acute on Chronic Diastolic CHF -Have asked for Is and Os to be documented. -Continue home dose of lasix of 40 mg BID for now. -Aim for negative fluid balance. -Has also been on diamox for contraction alkalosis. Bicarb is 38. ?can probably discontinue. -Daughter has requested consultation with his cardiologist Dr. Donnie Aho. Have left a message with his office staff.  Chronic Hypoxemic Respiratory Failure -Believed 2/2 COPD, CHF, ?interstitial lung disease. -Has been on oxygen 2-3L for a couple months. -Do not believe she has a PNA :will DC levaquin.  Recurrent Right Pleural Effusion -  Has required 2 thoracentesis in the past month. -Not significantly SOB or hypoxic over baseline, so will hold on repeat thoracentesis for now. -Prior fluid studies c/w transudate, so likely related to her CHF/hypoalbuminemia. -Continue diuresis. -Will add ensure.  Chronic Back Pain -Has been attributed to post-zoster neuralgia. -Continue neurontin and PRN pain meds.  Acute on CKD Stage III -Appears to be at baseline.  Hypothyroidism -TSH ok. -Continue home dose of synthroid.  FTT/Deconditioning -Will obtain PT/OT evals. -Daughter interested in [facility] if needs a SNF. Will alert SW.   Time spent coordinating care: 45 minutes.   LOS: 1 day   [facility] Pager: [phone] 05/29/2012, 9:06 AM

## 2012-05-29 NOTE — Progress Notes (Signed)
[facility]  Patient Status: Active (receiving services up to time of hospitalization)  [facility] is providing the following services: RN, PT and HHA  If patient discharges after hours, please call [phone].   Wynelle Bourgeois 05/29/2012, 9:36 AM

## 2012-05-29 NOTE — Evaluation (Signed)
Physical Therapy Evaluation Patient Details Name: [name] MRN: [id number] DOB: July 17, 1924 Today's Date: 05/29/2012 Time: 1308-6578 PT Time Calculation (min): 24 min  PT Assessment / Plan / Recommendation Clinical Impression  Pt admitted with PNA, CHF and back pain.  Pt able to ambulate with RW with good safety awareness.  Pt states she will stay with daughter when she first goes home and transition to home.  Will benefit from HHPT and intermittent supervision to improve patient's endurance.  Pt states that her husband is in the hospital currently.      PT Assessment  Patient needs continued PT services    Follow Up Recommendations  Home health PT;Supervision - Intermittent                Equipment Recommendations  None recommended by PT         Frequency Min 3X/week    Precautions / Restrictions Precautions Precautions: Fall Restrictions Weight Bearing Restrictions: No   Pertinent Vitals/Pain VSS, No pain      Mobility  Bed Mobility Bed Mobility: Supine to Sit Supine to Sit: 6: Modified independent (Device/Increase time) Transfers Transfers: Sit to Stand;Stand to Sit Sit to Stand: 5: Supervision Stand to Sit: 5: Supervision Ambulation/Gait Ambulation/Gait Assistance: 5: Supervision Ambulation Distance (Feet): 45 Feet Assistive device: Rolling walker Ambulation/Gait Assistance Details: Ambulated well in room with O2 at 2L.  No LOB in controlled environment.  Can accept min challenge to balance with RW.   Gait Pattern: Step-through pattern Gait velocity: decreased Stairs: No Wheelchair Mobility Wheelchair Mobility: No         Exercises General Exercises - Lower Extremity Ankle Circles/Pumps: AROM;Both;10 reps;Seated Long Arc Quad: AROM;Both;10 reps;Seated Hip Flexion/Marching: AROM;Both;10 reps;Seated   PT Diagnosis: Generalized weakness  PT Problem List: Decreased activity tolerance;Decreased balance;Decreased mobility;Decreased safety  awareness;Decreased knowledge of use of DME PT Treatment Interventions: DME instruction;Gait training;Stair training;Functional mobility training;Therapeutic activities;Therapeutic exercise;Balance training;Patient/family education   PT Goals Acute Rehab PT Goals PT Goal Formulation: With patient Time For Goal Achievement: 06/05/12 Potential to Achieve Goals: Good Pt will go Sit to Stand: Independently PT Goal: Sit to Stand - Progress: Goal set today Pt will Ambulate: 51 - 150 feet;with modified independence;with least restrictive assistive device PT Goal: Ambulate - Progress: Goal set today Pt will Go Up / Down Stairs: 1-2 stairs;with supervision;with least restrictive assistive device PT Goal: Up/Down Stairs - Progress: Goal set today Pt will Perform Home Exercise Program: with supervision, verbal cues required/provided PT Goal: Perform Home Exercise Program - Progress: Goal set today  Visit Information  Last PT Received On: 05/29/12 Assistance Needed: +1    Subjective Data  Subjective: "I am just weak." Patient Stated Goal: To go home   Prior Functioning  Home Living Lives With: Spouse Available Help at Discharge: Family;Available 24 hours/day (pt states she can stay with daughter) Type of Home: House Home Access: Stairs to enter [employer] of Steps: 1 Home Layout: One level Bathroom Shower/Tub: Engineer, manufacturing systems: Standard Home Adaptive Equipment: Walker - rolling;Straight cane;Shower chair with back (2LO2) Prior Function Level of Independence: Independent with assistive device(s) Able to Take Stairs?: Yes Driving: No Vocation: Retired Musician: No difficulties    Cognition  Overall Cognitive Status: Appears within functional limits for tasks assessed/performed Arousal/Alertness: Awake/alert Orientation Level: Appears intact for tasks assessed Behavior During Session: [facility] for tasks performed    Extremity/Trunk  Assessment Right Lower Extremity Assessment RLE ROM/Strength/Tone: Deficits RLE ROM/Strength/Tone Deficits: Strength at least 4/5 with functional  mobility Left Lower Extremity Assessment LLE ROM/Strength/Tone: Deficits LLE ROM/Strength/Tone Deficits: strength at least 4/5 with functional mobility   Balance Static Standing Balance Static Standing - Balance Support: Bilateral upper extremity supported;During functional activity Static Standing - Level of Assistance: 5: Stand by assistance Static Standing - Comment/# of Minutes: 1 minute  End of Session PT - End of Session Equipment Utilized During Treatment: Gait belt;Oxygen Activity Tolerance: Patient tolerated treatment well Patient left: in chair;with call bell/phone within reach Nurse Communication: Mobility status       INGOLD,Chrystal Zeimet 05/29/2012, 9:57 AM  [facility] [phone] (pager)

## 2012-05-29 NOTE — Progress Notes (Signed)
OT Cancellation Note  Patient Details Name: [name] MRN: [id number] DOB: May 17, 1924   Cancelled Treatment:    Reason Eval/Treat Not Completed: Pain limiting ability to participate.  Pt c/o back pain and declining therapy at this time.  RN made aware. Will re-attempt at later date.  05/29/2012 Cipriano Mile OTR/L Pager [phone] Office [phone]

## 2012-05-29 NOTE — Progress Notes (Signed)
Pharmacist Heart Failure Core Measure Documentation  Assessment: [name] has an EF documented as 55% on 04/16/12 by Echo.  Rationale: Heart failure patients with left ventricular systolic dysfunction (LVSD) and an EF < 40% should be prescribed an angiotensin converting enzyme inhibitor (ACEI) or angiotensin receptor blocker (ARB) at discharge unless a contraindication is documented in the medical record.  This patient is not currently on an ACEI or ARB for HF.  This note is being placed in the record in order to provide documentation that a contraindication to the use of these agents is present for this encounter.  ACE Inhibitor or Angiotensin Receptor Blocker is contraindicated (specify all that apply)  []   ACEI allergy AND ARB allergy []   Angioedema []   Moderate or severe aortic stenosis []   Hyperkalemia []   Hypotension []   Renal artery stenosis [x]   Worsening renal function, preexisting renal disease or dysfunction  Aloise Copus S. Merilynn Finland, PharmD, BCPS Clinical Staff Pharmacist Pager [phone] Misty Stanley [facility] 05/29/2012 10:59 AM

## 2012-05-29 NOTE — Consult Note (Signed)
Cardiology Consult Note  Admit date: 05/28/2012 Name: [name] 79 y.o.  female DOB:  04-Jan-1925 MRN:  [id number]  Today's date:  05/29/2012  Referring Physician:    [facility]  Primary Physician:    Dr, [name]  Reason for Consultation:    Recurrent pleural effusion and dyspnea on  IMPRESSIONS: 1. Reaccumulating pleural effusion on the right which has been transudative on previous taps  2. Mild to moderate aortic stenosis and mild to moderate mitral stenosis with moderate to severe mitral regurgitation  3. Pulmonary hypertension moderate  4. Coronary artery calcifications  5. Back pain thought due to shingles 6. Hypertensive heart disease  7. Hypothyroidism  8. Chronic respiratory failure with hypoxemia and home oxygen  9. Hyperlipidemia  10. History of chronic diastolic heart failure  RECOMMENDATION: She was admitted for unclear multifactorial reasons. She previously was somewhat weak and a decision was made not center previously to a skilled nursing facility. She came to the hospital because of weakness, some dyspnea, shaking and jerking. She does have recurrence of her right pleural effusion. As previously recommended she has been diuresed and her renal function had worsened somewhat prior to discharge. Despite this the effusion is reaccumulated and I would wonder whether there is another pulmonary cause for a rapid reaccumulation of the effusion.  I would continue diuresis and asked that a pulmonary physician reevaluate her.  HISTORY: 79 year-old female is readmitted to the hospital after having been discharged on Saturday. She has a prior history of a previous thalamic stroke in his hypertensive heart disease. She was admitted in December with dyspnea and had a large right pleural effusion was tapped and was transudate of. During that admission showed an echocardiogram that showed mild or moderate aortic stenosis and mild-to-moderate mitral stenosis and mitral  regurgitation. Left ventricular function was normal. She was readmitted to the hospital with back pain as well as a recurrent pleural effusion that was tapped and was again transudate. I saw her in the hospital and she had diuresed and her renal function is actually started to worsen because of the diuresis. Despite this she had some increase in her right pleural effusion. She was still quite weak and was having some difficulty walking any decision was made to discharge her home on Saturday. In the intervening time her husband had been hospitalized and did have a pacemaker inserted. He has since returned home. She was staying with her daughter. He was brought to the emergency room earlier this morning when she went to the bathroom and was unable to stand up and had an episode of jerking of her arms and legs no bowel or urinary incontinence. She was hypoxic in the emergency room and her daughter felt like she couldn't care for him and she was brought in to the hospital. She received a 500 cc bolus of normal saline in the emergency room. She's not currently having chest pain or shortness of breath.  Past Medical History  Diagnosis Date  . Stroke   . Anemia   . Hyperlipidemia   . Vitamin D deficiency   . Hypothyroid   . Pulmonary hypertension   . Chronic respiratory failure with hypoxia   . Blindness of right eye     Macular degeneration  . Macular degeneration   . Hypertensive heart disease 04/16/2012     Past Surgical History  Procedure Date  . Cataract extraction, bilateral     Allergies:  is allergic to lisinopril.   Medications: Prior to  Admission medications   Medication Sig Start Date End Date Taking? Authorizing Provider  acetaZOLAMIDE (DIAMOX) 250 MG tablet Take 250 mg by mouth 2 (two) times daily. 05/24/12   Dorothea Ogle, MD  beta carotene w/minerals (OCUVITE) tablet Take 1 tablet by mouth daily.    Historical Provider, MD  calcium carbonate (OS-CAL - DOSED IN MG OF ELEMENTAL  CALCIUM) 1250 MG tablet Take 1 tablet by mouth daily.    Historical Provider, MD  cholecalciferol (VITAMIN D) 1000 UNITS tablet Take 1,000 Units by mouth daily.    Historical Provider, MD  dipyridamole-aspirin (AGGRENOX) 25-200 MG per 12 hr capsule Take 1 capsule by mouth 2 (two) times daily.    Historical Provider, MD  Ferrous Sulfate (IRON) 325 (65 FE) MG TABS Take 1 tablet by mouth daily.    Historical Provider, MD  folic acid (FOLVITE) 1 MG tablet Take 1 mg by mouth daily.    Historical Provider, MD  furosemide (LASIX) 40 MG tablet Take 40 mg by mouth 2 (two) times daily. 05/24/12   Dorothea Ogle, MD  gabapentin (NEURONTIN) 100 MG capsule Take 100 mg by mouth 3 (three) times daily as needed. 05/24/12   Dorothea Ogle, MD  glucosamine-chondroitin 500-400 MG tablet Take 1 tablet by mouth 3 (three) times daily.    Historical Provider, MD  levothyroxine (SYNTHROID, LEVOTHROID) 100 MCG tablet Take 100 mcg by mouth daily.    Historical Provider, MD  lidocaine (LIDODERM) 5 % Place 1 patch onto the skin daily. Remove & Discard patch within 12 hours or as directed by MD 05/24/12   Dorothea Ogle, MD  methocarbamol (ROBAXIN) 500 MG tablet Take 500 mg by mouth 3 (three) times daily as needed. Muscle spasms    Historical Provider, MD  metoprolol tartrate (LOPRESSOR) 12.5 mg TABS Take 12.5 mg by mouth 2 (two) times daily. 04/21/12   Hollice Espy, MD  Multiple Vitamin (MULITIVITAMIN WITH MINERALS) TABS Take 1 tablet by mouth daily.    Historical Provider, MD  omeprazole (PRILOSEC) 40 MG capsule Take 40 mg by mouth daily.    Historical Provider, MD  oxyCODONE-acetaminophen (PERCOCET/ROXICET) 5-325 MG per tablet Take 1 tablet by mouth every 6 (six) hours as needed. For pain 05/24/12   Dorothea Ogle, MD  simvastatin (ZOCOR) 40 MG tablet Take 40 mg by mouth every evening.    Historical Provider, MD  traMADol (ULTRAM) 50 MG tablet Take 50 mg by mouth 3 (three) times daily. For pain relief    Historical Provider, MD      Family History: Family Status  Relation Status Death Age  . Mother Deceased     died of MI  . Father Deceased 68    heart disease  . Brother Deceased     died of MI    Social History:   reports that she quit smoking about 14 years ago. Her smoking use included Cigarettes. She has a 25 pack-year smoking history. She has never used smokeless tobacco. She reports that she does not drink alcohol or use illicit drugs.   History   Social History Narrative  . No narrative on file    Review of Systems: She has had significant upper back pain. She complains of issues with her extremities jerking. She has been unsteady on her feet. She has chronic dizziness and some difficulty walking. She has significant dyspnea. Other than as noted above the remainder of the review of systems is unremarkable.  Physical Exam: BP 113/53  Pulse 73  Temp 97.4 F (36.3 C) (Oral)  Resp 18  Wt 76.1 kg (167 lb 12.3 oz)  SpO2 97%  General appearance: Pleasant elderly woman who is lying in bed and isn't currently in no acute distress  Head: Normocephalic, without obvious abnormality, atraumatic  Eyes: Right eye appears blind, conjunctiva and sclera clear on the left eye  Neck: no adenopathy, no carotid bruit, no JVD and supple, symmetrical, trachea midline  Lungs: Reduced breath sounds at the right base, no wheezing heard, faint crackles at left base  Heart: Regular rate and rhythm, normal S1-S2, no diastolic murmur noted, 2-3 /6 systolic murmur at aortic area and also at the apex,  Abdomen: soft, non-tender; bowel sounds normal; no masses, no organomegaly  Pelvic: deferred  Extremities: Mild venous insufficiency changes noted, trace edema  Pulses: 2+ and symmetric  Skin: Skin color, texture, turgor normal. No rashes or lesions  Neurologic: Grossly normal  Labs: CBC  Basename 05/29/12 0608 05/28/12 1103  WBC 5.8 --  RBC 3.17* --  HGB 9.7* --  HCT 31.3* --  PLT 233 --  MCV 98.7 --  MCH 30.6  --  MCHC 31.0 --  RDW 13.3 --  LYMPHSABS -- 0.6*  MONOABS -- 0.8  EOSABS -- 0.3  BASOSABS -- 0.0   CMP   Basename 05/29/12 0608  NA 139  K 4.1  CL 95*  CO2 38*  GLUCOSE 105*  BUN 60*  CREATININE 1.34*  CALCIUM 9.5  PROT 6.6  ALBUMIN 2.8*  AST 18  ALT 8  ALKPHOS 73  BILITOT 0.2*  GFRNONAA 35*  GFRAA 41*   BNP (last 3 results)  Basename 05/28/12 0950 05/20/12 0505 05/17/12 1334  PROBNP 1884.0* 2336.0* 1423.0*    Radiology: Moderate right pleural effusion  EKG: Sinus rhythm with PVCs no acute changes  Signed:  W. Ashley Royalty MD [facility]   Cardiology Consultant  05/29/2012, 2:12 PM

## 2012-05-30 ENCOUNTER — Ambulatory Visit: Payer: Medicare Other | Admitting: Internal Medicine

## 2012-05-30 ENCOUNTER — Inpatient Hospital Stay (HOSPITAL_COMMUNITY): Payer: Medicare Other

## 2012-05-30 LAB — COMPREHENSIVE METABOLIC PANEL
Albumin: 2.8 g/dL — ABNORMAL LOW (ref 3.5–5.2)
Alkaline Phosphatase: 74 U/L (ref 39–117)
BUN: 53 mg/dL — ABNORMAL HIGH (ref 6–23)
Calcium: 9.5 mg/dL (ref 8.4–10.5)
GFR calc Af Amer: 39 mL/min — ABNORMAL LOW (ref >90)
Potassium: 4 mEq/L (ref 3.5–5.1)
Total Protein: 6.8 g/dL (ref 6.0–8.3)

## 2012-05-30 LAB — CBC
HCT: 31.7 % — ABNORMAL LOW (ref 36.0–46.0)
Hemoglobin: 9.8 g/dL — ABNORMAL LOW (ref 12.0–15.0)
MCV: 97.8 fL (ref 78.0–100.0)
RBC: 3.24 MIL/uL — ABNORMAL LOW (ref 3.87–5.11)
RDW: 13.3 % (ref 11.5–15.5)
WBC: 5.6 10*3/uL (ref 4.0–10.5)

## 2012-05-30 NOTE — Progress Notes (Signed)
Subjective:  C/O back pain and doesn't feel well this am.  Weak. desats with exercise. Good diuresis negative almost 3 liters.  No chest pain.  Objective:  Vital Signs in the last 24 hours: BP 116/37  Pulse 87  Temp 97.3 F (36.3 C) (Oral)  Resp 17  Ht 5\' 4"  (1.626 m)  Wt 77 kg (169 lb 12.1 oz)  BMI 29.14 kg/m2  SpO2 97%  Physical Exam: Elderly WF in [place]Ad Lungs:  Reduced BS right base, mild rales left base  Cardiac:  Regular rhythm with PVC's , normal S1 and S2, no S3 2/6 systolic murmur Abdomen:  Soft, nontender, no masses Extremities:  No edema present  Intake/Output from previous day: 01/15 0701 - 01/16 0700 In: 840 [P.O.:840] Out: 1851 [Urine:1850; Stool:1] Weight Filed Weights   05/28/12 1802 05/29/12 0454 05/30/12 0633  Weight: 75.6 kg (166 lb 10.7 oz) 76.1 kg (167 lb 12.3 oz) 77 kg (169 lb 12.1 oz)    Lab Results: Basic Metabolic Panel:  Basename 05/30/12 0630 05/29/12 0608  NA 140 139  K 4.0 4.1  CL 95* 95*  CO2 39* 38*  GLUCOSE 99 105*  BUN 53* 60*  CREATININE 1.39* 1.34*    CBC:  Basename 05/30/12 0630 05/29/12 0608 05/28/12 1103 05/28/12 0950  WBC 5.6 5.8 -- --  NEUTROABS -- -- 4.9 4.6  HGB 9.8* 9.7* -- --  HCT 31.7* 31.3* -- --  MCV 97.8 98.7 -- --  PLT 257 233 -- --    BNP    Component Value Date/Time   PROBNP 1884.0* 05/28/2012 0950   Telemetry: Sinus with PVC's   Assessment/Plan:  1. Dyspnea is likely multifactorial both pulmonary and cardiac as well as related to pleural effusion 2. Mitral and aortic valve disease, not candidate for intervention 3. CKD stage 3-4 4. Pleural effusion  Rec:  With her readmit and failure to thrive she is at high risk of readmission to hospital and prior to discharge she will need to have either plans for SNF or an extensive plan in place to prevent readmission.  Continue diuresis, but suspect that not all of the dyspnea is purely cardiac.  Darden Palmer  MD [facility] Card[facility]  05/30/2012,  11:05 AM

## 2012-05-30 NOTE — Progress Notes (Signed)
[facility]             Progress Note   Subjective: Her main issue today is chronic back pain from her zoster neuralgia. No SOB.  Objective: Vital signs in last 24 hours: Temp:  [97.3 F (36.3 C)-98 F (36.7 C)] 97.3 F (36.3 C) (01/16 4098) Pulse Rate:  [62-93] 87  (01/16 0755) Resp:  [17-18] 17  (01/16 0633) BP: (116-128)/(37-65) 116/37 mmHg (01/16 0633) SpO2:  [94 %-100 %] 97 % (01/16 0755) Weight:  [77 kg (169 lb 12.1 oz)] 77 kg (169 lb 12.1 oz) (01/16 1191) Weight change: 1.4 kg (3 lb 1.4 oz) Last BM Date: 05/29/12  Intake/Output from previous day: 01/15 0701 - 01/16 0700 In: 840 [P.O.:840] Out: 1851 [Urine:1850; Stool:1] Total I/O In: 240 [P.O.:240] Out: 1951 [Urine:1950; Stool:1]   Physical Exam: General: Alert, awake, oriented x3. HEENT: No bruits, no goiter. Heart: prominent systolic murmur. Lungs: Decreased breath sounds right base. Abdomen: Soft, nontender, nondistended, positive bowel sounds. Extremities: Trace pedal edema, +pedal pulses. Neuro: Grossly intact, nonfocal. I have not ambulated her.    Lab Results: Basic Metabolic Panel:  Basename 05/30/12 0630 05/29/12 0608  NA 140 139  K 4.0 4.1  CL 95* 95*  CO2 39* 38*  GLUCOSE 99 105*  BUN 53* 60*  CREATININE 1.39* 1.34*  CALCIUM 9.5 9.5  MG -- --  PHOS -- --   Liver Function Tests:  Basename 05/30/12 0630 05/29/12 0608  AST 19 18  ALT 8 8  ALKPHOS 74 73  BILITOT 0.2* 0.2*  PROT 6.8 6.6  ALBUMIN 2.8* 2.8*   CBC:  Basename 05/30/12 0630 05/29/12 0608 05/28/12 1103 05/28/12 0950  WBC 5.6 5.8 -- --  NEUTROABS -- -- 4.9 4.6  HGB 9.8* 9.7* -- --  HCT 31.7* 31.3* -- --  MCV 97.8 98.7 -- --  PLT 257 233 -- --   BNP:  Basename 05/28/12 0950  PROBNP 1884.0*   Thyroid Function Tests:  Basename 05/28/12 2115  TSH 0.867  T4TOTAL --  FREET4 --  T3FREE --  THYROIDAB --   Urinalysis:  Basename 05/28/12 1206  COLORURINE YELLOW  LABSPEC 1.014  PHURINE 7.0    GLUCOSEU NEGATIVE  HGBUR NEGATIVE  BILIRUBINUR NEGATIVE  KETONESUR NEGATIVE  PROTEINUR NEGATIVE  UROBILINOGEN 0.2  NITRITE NEGATIVE  LEUKOCYTESUR TRACE*    Recent Results (from the past 240 hour(s))  CULTURE, BLOOD (ROUTINE X 2)     Status: Normal (Preliminary result)   Collection Time   05/28/12 12:55 PM      Component Value Range Status Comment   Specimen Description BLOOD RIGHT ANTECUBITAL   Final    Special Requests BOTTLES DRAWN AEROBIC AND ANAEROBIC 10CC   Final    Culture  Setup Time 05/28/2012 19:22   Final    Culture     Final    Value:        BLOOD CULTURE RECEIVED NO GROWTH TO DATE CULTURE WILL BE HELD FOR 5 DAYS BEFORE ISSUING A FINAL NEGATIVE REPORT   Report Status PENDING   Incomplete   CULTURE, BLOOD (ROUTINE X 2)     Status: Normal (Preliminary result)   Collection Time   05/28/12  1:05 PM      Component Value Range Status Comment   Specimen Description BLOOD HAND RIGHT   Final    Special Requests BOTTLES DRAWN AEROBIC ONLY 10CC   Final    Culture  Setup Time 05/28/2012 19:23   Final  Culture     Final    Value:        BLOOD CULTURE RECEIVED NO GROWTH TO DATE CULTURE WILL BE HELD FOR 5 DAYS BEFORE ISSUING A FINAL NEGATIVE REPORT   Report Status PENDING   Incomplete     Studies/Results: Dg Chest 2 View  05/28/2012  *RADIOLOGY REPORT*  Clinical Data: Shortness of breath, former smoker, recent right thoracentesis  CHEST - 2 VIEW  Comparison: 05/22/2012  Findings: Enlargement of cardiac silhouette with pulmonary vascular congestion. Atherosclerotic calcification aorta. Moderate right pleural effusion with basilar atelectasis. Minimal effusion and atelectasis at left base. Underlying COPD. No pneumothorax. Bones demineralized.  IMPRESSION: Moderate right pleural effusion and basilar atelectasis. Changes of COPD with minimal left basilar atelectasis and small effusion. Enlargement of cardiac silhouette with pulmonary vascular congestion.   Original Report Authenticated  By: Ulyses Southward, M.D.    Ct Head Wo Contrast  05/28/2012  *RADIOLOGY REPORT*  Clinical Data: Weakness, tremors, back pain  CT HEAD WITHOUT CONTRAST  Technique:  Contiguous axial images were obtained from the base of the skull through the vertex without contrast.  Comparison: CT 08/04/2011  Findings: Small chronic lacunar infarct right ventral thalamus. Negative for acute infarct.  Negative for hemorrhage or mass lesion.  No change from the prior study.  Atherosclerotic calcification in the carotid and vertebral arteries.  No acute skull abnormality.  Chronic atrophy of the right globe.  IMPRESSION: No acute intracranial abnormality.   Original Report Authenticated By: Janeece Riggers, M.D.    Dg Chest Port 1 View  05/30/2012  *RADIOLOGY REPORT*  Clinical Data: Pulmonary edema  PORTABLE CHEST - 1 VIEW  Comparison: 05/28/2012  Findings: Normal heart size.  Bilateral effusions stable. Bibasilar pulmonary opacities right greater than left stable. Vascular congestion has not significantly changed.  No pneumothorax.  IMPRESSION: Stable bilateral airspace disease, bilateral pleural effusions, and vascular congestion.   Original Report Authenticated By: Jolaine Click, M.D.     Medications: Scheduled Meds:    . beta carotene w/minerals  1 tablet Oral Daily  . calcium carbonate  1 tablet Oral Daily  . dipyridamole-aspirin  1 capsule Oral BID  . docusate sodium  100 mg Oral BID  . enoxaparin (LOVENOX) injection  30 mg Subcutaneous Q24H  . feeding supplement  237 mL Oral BID BM  . ferrous sulfate  325 mg Oral Q breakfast  . folic acid  1 mg Oral Daily  . furosemide  40 mg Oral BID  . levothyroxine  100 mcg Oral QAC breakfast  . lidocaine  1 patch Transdermal Q24H  . multivitamin with minerals  1 tablet Oral Daily  . pantoprazole  40 mg Oral Daily  . simvastatin  40 mg Oral QPM  . sodium chloride  3 mL Intravenous Q12H  . sodium chloride  3 mL Intravenous Q12H  . traMADol  50 mg Oral TID   Continuous  Infusions:  PRN Meds:.sodium chloride, acetaminophen, acetaminophen, gabapentin, HYDROcodone-acetaminophen, sodium chloride  Assessment/Plan:  Principal Problem:  *Acute on chronic diastolic congestive heart failure Active Problems:  Pleural effusion  Chronic respiratory failure with hypoxia  ARF (acute renal failure)  Failure to thrive  CKD (chronic kidney disease) stage 3, GFR 30-59 ml/min  Post zoster neuralgia    Acute on Chronic Diastolic CHF -Is 2.7 L negative since admission. -Continue home dose of lasix of 40 mg BID for now. -Aim for continued negative fluid balance. -CXR this am still with vascular congestion. -Will keep in the hospital an  extra day to optimize her fluid status. -Diamox has been discontinued.  Chronic Hypoxemic Respiratory Failure -Believed 2/2 COPD, CHF, ?interstitial lung disease. -Has been on oxygen 2-3L for a couple months. -Do not believe she has a PNA as she has no leukocytosis, cough or fever.  Recurrent Right Pleural Effusion -Has required 2 thoracentesis in the past month. -Not significantly SOB or hypoxic over baseline, so will hold on repeat thoracentesis for now. -Prior fluid studies c/w transudate, so likely related to her CHF/hypoalbuminemia. -Continue diuresis. -Will add ensure. -Pulmonary agrees with no further thoracentesis.  Chronic Back Pain -Has been attributed to post-zoster neuralgia. -Continue neurontin and PRN pain meds.  Acute on CKD Stage III -Appears to be at baseline.  Hypothyroidism -TSH ok. -Continue home dose of synthroid.  FTT/Deconditioning -PT recs HH. -Per SW, unable to go to SNF unless family is willing to privately pay for it.  Disposition -Will plan on DC home in am as long as respiratory status stable to allow for extra diuresis.   Time spent coordinating care: 45 minutes.   LOS: 2 days   [facility] Pager: [phone] 05/30/2012, 10:59 AM

## 2012-05-30 NOTE — Progress Notes (Signed)
Nutrition Brief Note  Patient identified on the Malnutrition Screening Tool (MST) Report for recent weight loss without trying (patient unsure).  Patient's weight has been stable per readings below:  Wt Readings from Last 10 Encounters:  05/30/12 169 lb 12.1 oz (77 kg)  05/25/12 164 lb 3.2 oz (74.481 kg)  04/21/12 162 lb 4.1 oz (73.6 kg)  03/01/12 161 lb 9.6 oz (73.301 kg)  08/04/11 168 lb (76.204 kg)    Body mass index is 29.14 kg/(m^2).  Patient meets criteria for Overweight based on current BMI.   Current diet order is Heart Healthy, patient is consuming approximately 75% of meals at this time.  Labs and medications reviewed.   No nutrition interventions warranted at this time.  Please consult RD as needed.  Maureen Chatters, RD, LDN Pager #: [phone] After-Hours Pager #: [phone]

## 2012-05-30 NOTE — Clinical Social Work Psychosocial (Signed)
Clinical Social Work Department BRIEF PSYCHOSOCIAL ASSESSMENT 05/30/2012  Patient:  [name], [name]     Account Number:  [id number]     Admit date:  05/28/2012  Clinical Social Worker:  Thomasene Mohair  Date/Time:  05/30/2012 03:50 PM  Referred by:  Physician  Date Referred:  05/29/2014 Referred for  SNF Placement   Other Referral:   Interview type:  Patient Other interview type:   Daughter and son-in-law at bedside    PSYCHOSOCIAL DATA Living Status:  HUSBAND Admitted from facility:   Level of care:   Primary support name:  Linda/daughter Primary support relationship to patient:  CHILD, ADULT Degree of support available:   strong support from daughter    CURRENT CONCERNS Current Concerns  Post-Acute Placement   Other Concerns:    SOCIAL WORK ASSESSMENT / PLAN CSW was referred to Pt to assist with dc planning. Pt is married, lives with her husband of 38 years. She has 3 children, with 1 daughter who lives nearby and helps as needed. Pt's spouse has had a recent illness and is in a nursing facility for short-term rehab.  Pt has readmitted to the hospital in less than a week. She discharged home with home health, at this time the recommendation is for SNF for more acute rehab and nursing care. Pt has been to [facility] in the past and would like to return.  CSW will send Pt's information to [facility] for potential availability.   Assessment/plan status:  Psychosocial Support/Ongoing Assessment of Needs Other assessment/ plan:   Information/referral to community resources:   SNF options    PATIENT'S/FAMILY'S RESPONSE TO PLAN OF CARE: Pt and Pt's daughter feel that Pt needed more therapy after last discharge and will benefit from SNF at dc.    Frederico Hamman, LCSW [phone]

## 2012-05-30 NOTE — Progress Notes (Signed)
PULMONARY  / CRITICAL CARE MEDICINE  Name: [name] MRN: [id number] DOB: Nov 08, 1924    LOS: 2  REFERRING PROVIDER:  Peggye Pitt  CHIEF COMPLAINT:  Short of breath  BRIEF PATIENT DESCRIPTION:  79 yo female former smoker admitted on 05/28/2012 due to weakness, dyspnea, and hypoxia with acute diastolic CHF, pulmonary edema, and recurrent Rt pleural effusion.  PCCM consulted 1/15 to assess dyspnea, hypoxia, and pleural effusion.  Significant PMHx of CVA, Anemia, hyperlipidemia, Hypothyroidism, HTN  CULTURES: Blood 1/14 >>  ANTIBIOTICS: Vancomycin 1/14 >> 1/14 Zosyn 1/14 >> 1/14   EVENTS:   TESTS: !06/18/11 Echo >> mod LVH, EF 55%, mild AR, mod MR, mod LA dilation, PAS 44 mmHg 04/18/12 Rt thoracentesis >> protein 2.5, LDH 76, WBC 575 (1N, 80L, 64M), cx negative, cytology >> reactive mesothelial cells and small lymphocytes  INTERVAL HISTORY:  Her breathing is improved since hospital admit.  She has soreness in Rt chest.  She denies cough or sputum.  Still needing supplemental oxygen.  VITAL SIGNS: Temp:  [97.3 F (36.3 C)-98 F (36.7 C)] 97.3 F (36.3 C) (01/16 4098) Pulse Rate:  [62-93] 87  (01/16 0755) Resp:  [17-18] 17  (01/16 0633) BP: (116-128)/(37-65) 116/37 mmHg (01/16 0633) SpO2:  [94 %-100 %] 97 % (01/16 0755) Weight:  [169 lb 12.1 oz (77 kg)] 169 lb 12.1 oz (77 kg) (01/16 1191)  PHYSICAL EXAMINATION: General:  No distress Neuro:  Normal strength, decreased hearing HEENT:  Blind in Rt eye Cardiovascular:  s1s2 regular, 2/6 SM Lungs:  Decreased BS Rt base, no wheeze Abdomen:  Soft, non-tender Musculoskeletal:  No edema Skin:  No rashes   Lab 05/30/12 0630 05/29/12 0608 05/28/12 2115 05/28/12 1105 05/28/12 0950  NA 140 139 -- 139 --  K 4.0 4.1 -- 4.3 --  CL 95* 95* -- 90* --  CO2 39* 38* -- -- 37*  BUN 53* 60* -- 73* --  CREATININE 1.39* 1.34* 1.44* -- --  GLUCOSE 99 105* -- 120* --    Lab 05/30/12 0630 05/29/12 0608 05/28/12 2115  HGB 9.8* 9.7*  9.5*  HCT 31.7* 31.3* 30.6*  WBC 5.6 5.8 5.5  PLT 257 233 237   Ct Head Wo Contrast  05/28/2012  *RADIOLOGY REPORT*  Clinical Data: Weakness, tremors, back pain  CT HEAD WITHOUT CONTRAST  Technique:  Contiguous axial images were obtained from the base of the skull through the vertex without contrast.  Comparison: CT 08/04/2011  Findings: Small chronic lacunar infarct right ventral thalamus. Negative for acute infarct.  Negative for hemorrhage or mass lesion.  No change from the prior study.  Atherosclerotic calcification in the carotid and vertebral arteries.  No acute skull abnormality.  Chronic atrophy of the right globe.  IMPRESSION: No acute intracranial abnormality.   Original Report Authenticated By: Janeece Riggers, M.D.    Dg Chest Port 1 View  05/30/2012  *RADIOLOGY REPORT*  Clinical Data: Pulmonary edema  PORTABLE CHEST - 1 VIEW  Comparison: 05/28/2012  Findings: Normal heart size.  Bilateral effusions stable. Bibasilar pulmonary opacities right greater than left stable. Vascular congestion has not significantly changed.  No pneumothorax.  IMPRESSION: Stable bilateral airspace disease, bilateral pleural effusions, and vascular congestion.   Original Report Authenticated By: Jolaine Click, M.D.     ASSESSMENT / PLAN:  Acute on chronic hypoxic respiratory failure from acute pulmonary edema and recurrent Rt pleural effusion. Plan: C/o diuresis per cardiology and primary team Adjust oxygen to keep SpO2 > 92% F/u PA/lateral CXR 1/17 >>  if no change in pleural fluid, then repeat thoracentesis  Acute on chronic diastolic CHF, valvular heart disease. Plan: Per cardiology and primary team  Coralyn Helling, MD [facility] Pulmonary/Critical Care 05/30/2012, 1:12 PM Pager:  [phone] After 3pm call: [phone]

## 2012-05-30 NOTE — Care Management Note (Unsigned)
    Page 1 of 1   05/30/2012     3:02:13 PM   CARE MANAGEMENT NOTE 05/30/2012  Patient:  [name], [name]   Account Number:  [id number]  Date Initiated:  05/30/2012  Documentation initiated by:  Karman Biswell  Subjective/Objective Assessment:   PT ADM ON 05/28/12 WITH HYPOXIA, PLEURAL EFFUSION, WEAKNESS. PTA, PT RESIDES AT HOME WITH HER DAUGHTER.     Action/Plan:   MET WITH PT, G-DTR, AND OCCUPATIONAL THERAPIST.  G-DTR STATES THAT FAMILY CANNOT PROVIDE 24HR CARE AT DC.  OT RECOMMENDING ST-SNF FOR REHAB AT DC, AND PT IS AGREEABLE TO THIS.   Anticipated DC Date:  05/31/2012   Anticipated DC Plan:  SKILLED NURSING FACILITY  In-house referral  Clinical Social Worker      DC Planning Services  CM consult      Choice offered to / List presented to:             Status of service:  In process, will continue to follow Medicare Important Message given?   (If response is "NO", the following Medicare IM given date fields will be blank) Date Medicare IM given:   Date Additional Medicare IM given:    Discharge Disposition:    Per UR Regulation:  Reviewed for med. necessity/level of care/duration of stay  If discussed at Long Length of Stay Meetings, dates discussed:    Comments:  05/30/12 Dorthea Maina,RN,BSN [phone] REFERRAL TO CSW TO FACILITATE DC TO SNF WHEN MEDICALLY STABLE.  ALSO SPOKE WITH ATTENDING MD, WHO STATES SHE IS OK WITH THIS DC PLAN.

## 2012-05-30 NOTE — Evaluation (Signed)
Occupational Therapy Evaluation Patient Details Name: [name] MRN: [id number] DOB: [birth year] Today's Date: 05/30/2012 Time: 4098-1191 OT Time Calculation (min): 31 min  OT Assessment / Plan / Recommendation Clinical Impression    Pt admitted with PNA, CHF and back pain.  Recently admitted from 05/16/12-05/24/12 for recurrent pleural effusion. Pt demonstrating generalized weakness during eval session and is limited by back pain.  Daughter reports concern about being able to provide 24/7 assist for pt at home.  Feel that pt could benefit from short term stay at SNF to maximize independence and safety before returning home.  If pt chooses to return home, then recommend HHOT and 24/7 assist.      OT Assessment  Patient needs continued OT Services    Follow Up Recommendations  SNF    Barriers to Discharge Decreased caregiver support Daughter unsure she provide 24/7 assist.  Equipment Recommendations  3 in 1 bedside comode    Recommendations for Other Services    Frequency  Min 2X/week    Precautions / Restrictions Precautions Precautions: Fall Restrictions Weight Bearing Restrictions: No   Pertinent Vitals/Pain See vitals    ADL  Grooming: Performed;Wash/dry hands;Min guard Where Assessed - Grooming: Supported Copywriter, advertising: Performed;Minimal Dentist Method: Sit to Barista: Materials engineer and Hygiene: Performed;Minimal assistance Where Assessed - Engineer, mining and Hygiene: Sit to stand from 3-in-1 or toilet Equipment Used: Gait belt;Rolling walker (2L O2) Transfers/Ambulation Related to ADLs: min guard with RW.  Pt requesting to ambulate in hall (ambulated ~40 ft). ADL Comments: Pt requiring increased time for tasks.  Pt originally reporting that daughter could provide 24/7 assistance.  Spoke with daughter, [name], on phone who reports that she cannot provide 24/7 assist  and does not feel comfortable taking pt at home at this time.      OT Diagnosis: Generalized weakness;Acute pain  OT Problem List: Decreased activity tolerance;Decreased strength;Decreased safety awareness;Decreased knowledge of use of DME or AE;Pain OT Treatment Interventions: Self-care/ADL training;DME and/or AE instruction;Therapeutic activities;Patient/family education   OT Goals Acute Rehab OT Goals OT Goal Formulation: With patient Time For Goal Achievement: 06/06/12 Potential to Achieve Goals: Good ADL Goals Pt Will Perform Grooming: with modified independence;Standing at sink ADL Goal: Grooming - Progress: Goal set today Pt Will Perform Lower Body Dressing: with modified independence;Sit to stand from chair;Sit to stand from bed ADL Goal: Lower Body Dressing - Progress: Goal set today Pt Will Transfer to Toilet: with modified independence;Ambulation;with DME;Comfort height toilet ADL Goal: Toilet Transfer - Progress: Goal set today Pt Will Perform Toileting - Clothing Manipulation: with modified independence;Standing ADL Goal: Toileting - Clothing Manipulation - Progress: Goal set today Pt Will Perform Toileting - Hygiene: with modified independence;Sit to stand from 3-in-1/toilet ADL Goal: Toileting - Hygiene - Progress: Goal set today Pt Will Perform Tub/Shower Transfer: Tub transfer;Ambulation;with DME;Shower seat with back ADL Goal: Tub/Shower Transfer - Progress: Goal set today  Visit Information  Last OT Received On: 05/30/12 Assistance Needed: +1    Subjective Data  Subjective: "I just feel weak"   Prior Functioning     Home Living Lives With: Spouse Available Help at Discharge: Family;Available PRN/intermittently (per daughter report) Type of Home: House Home Access: Stairs to enter [employer] of Steps: 1 Home Layout: One level Bathroom Shower/Tub: Engineer, manufacturing systems: Standard Home Adaptive Equipment: Walker - rolling;Straight  cane;Shower chair with back (2L O2) Prior Function Level of Independence: Independent with assistive device(s) Able  to Take Stairs?: Yes Driving: No Vocation: Retired Musician: HOH         Vision/Perception     Cognition  Overall Cognitive Status: Appears within functional limits for tasks assessed/performed Arousal/Alertness: Awake/alert Orientation Level: Appears intact for tasks assessed Behavior During Session: [facility] for tasks performed    Extremity/Trunk Assessment Right Upper Extremity Assessment RUE ROM/Strength/Tone: [facility] for tasks assessed Left Upper Extremity Assessment LUE ROM/Strength/Tone: [facility] for tasks assessed     Mobility Bed Mobility Bed Mobility: Supine to Sit;Sitting - Scoot to Edge of Bed Supine to Sit: 6: Modified independent (Device/Increase time);HOB elevated;With rails (50 degrees) Sitting - Scoot to Edge of Bed: 6: Modified independent (Device/Increase time) Transfers Transfers: Sit to Stand;Stand to Sit Sit to Stand: 4: Min assist;From chair/3-in-1;From bed;With upper extremity assist;With armrests Stand to Sit: To chair/3-in-1;With armrests;With upper extremity assist;4: Min assist Details for Transfer Assistance: Assist to for powerup from bed/3n1 and to control descent. VC for safe hand placement.     Shoulder Instructions     Exercise     Balance Balance Balance Assessed: Yes Static Sitting Balance Static Sitting - Balance Support: No upper extremity supported;Feet supported Static Sitting - Level of Assistance: 5: Stand by assistance Static Sitting - Comment/# of Minutes: 2 min   End of Session OT - End of Session Equipment Utilized During Treatment: Gait belt Activity Tolerance: Patient tolerated treatment well Patient left: in chair;with call bell/phone within reach;with family/visitor present Nurse Communication: Mobility status  GO    05/30/2012 Cipriano Mile OTR/L Pager [phone] Office  [phone]  Cipriano Mile 05/30/2012, 12:05 PM

## 2012-05-31 ENCOUNTER — Inpatient Hospital Stay (HOSPITAL_COMMUNITY): Payer: Medicare Other

## 2012-05-31 DIAGNOSIS — I4891 Unspecified atrial fibrillation: Secondary | ICD-10-CM

## 2012-05-31 LAB — BODY FLUID CELL COUNT WITH DIFFERENTIAL
Lymphs, Fluid: 80 %
Monocyte-Macrophage-Serous Fluid: 18 % — ABNORMAL LOW (ref 50–90)
Neutrophil Count, Fluid: 1 % (ref 0–25)
Total Nucleated Cell Count, Fluid: 465 cu mm (ref 0–1000)

## 2012-05-31 LAB — LACTATE DEHYDROGENASE, PLEURAL OR PERITONEAL FLUID: LD, Fluid: 101 U/L — ABNORMAL HIGH (ref 3–23)

## 2012-05-31 LAB — GLUCOSE, SEROUS FLUID: Glucose, Fluid: 142 mg/dL

## 2012-05-31 LAB — PROTEIN, BODY FLUID: Total protein, fluid: 2.9 g/dL

## 2012-05-31 MED ORDER — DILTIAZEM HCL 100 MG IV SOLR
5.0000 mg/h | INTRAVENOUS | Status: DC
  Administered 2012-05-31: 5 mg/h via INTRAVENOUS
  Administered 2012-05-31 (×2): 10 mg/h via INTRAVENOUS
  Filled 2012-05-31 (×2): qty 100

## 2012-05-31 MED ORDER — DILTIAZEM LOAD VIA INFUSION
10.0000 mg | Freq: Once | INTRAVENOUS | Status: AC
  Administered 2012-05-31: 10 mg via INTRAVENOUS
  Filled 2012-05-31: qty 10

## 2012-05-31 NOTE — Progress Notes (Addendum)
[facility]             Progress Note   Subjective: Back pain continues to be an issue. Is now in a fib sustaining HRs in the 140s.  Objective: Vital signs in last 24 hours: Temp:  [97.1 F (36.2 C)-97.5 F (36.4 C)] 97.4 F (36.3 C) (01/17 0700) Pulse Rate:  [76-150] 150  (01/17 0700) Resp:  [17-18] 17  (01/17 0517) BP: (103-135)/(33-64) 115/64 mmHg (01/17 0700) SpO2:  [96 %-100 %] 96 % (01/17 0700) Weight:  [76.4 kg (168 lb 6.9 oz)] 76.4 kg (168 lb 6.9 oz) (01/17 0517) Weight change: -0.6 kg (-1 lb 5.2 oz) Last BM Date: 05/30/12  Intake/Output from previous day: 01/16 0701 - 01/17 0700 In: 480 [P.O.:480] Out: 3501 [Urine:3500; Stool:1] Total I/O In: 240 [P.O.:240] Out: -    Physical Exam: General: Alert, awake, oriented x3. HEENT: No bruits, no goiter. Heart: prominent systolic murmur. Lungs: Decreased breath sounds right base. Abdomen: Soft, nontender, nondistended, positive bowel sounds. Extremities: Trace pedal edema, +pedal pulses. Neuro: Grossly intact, nonfocal. I have not ambulated her.    Lab Results: Basic Metabolic Panel:  Basename 05/30/12 0630 05/29/12 0608  NA 140 139  K 4.0 4.1  CL 95* 95*  CO2 39* 38*  GLUCOSE 99 105*  BUN 53* 60*  CREATININE 1.39* 1.34*  CALCIUM 9.5 9.5  MG -- --  PHOS -- --   Liver Function Tests:  Basename 05/30/12 0630 05/29/12 0608  AST 19 18  ALT 8 8  ALKPHOS 74 73  BILITOT 0.2* 0.2*  PROT 6.8 6.6  ALBUMIN 2.8* 2.8*   CBC:  Basename 05/30/12 0630 05/29/12 0608 05/28/12 1103 05/28/12 0950  WBC 5.6 5.8 -- --  NEUTROABS -- -- 4.9 4.6  HGB 9.8* 9.7* -- --  HCT 31.7* 31.3* -- --  MCV 97.8 98.7 -- --  PLT 257 233 -- --   BNP:  Basename 05/28/12 0950  PROBNP 1884.0*   Thyroid Function Tests:  Basename 05/28/12 2115  TSH 0.867  T4TOTAL --  FREET4 --  T3FREE --  THYROIDAB --   Urinalysis:  Basename 05/28/12 1206  COLORURINE YELLOW  LABSPEC 1.014  PHURINE 7.0  GLUCOSEU NEGATIVE   HGBUR NEGATIVE  BILIRUBINUR NEGATIVE  KETONESUR NEGATIVE  PROTEINUR NEGATIVE  UROBILINOGEN 0.2  NITRITE NEGATIVE  LEUKOCYTESUR TRACE*    Recent Results (from the past 240 hour(s))  CULTURE, BLOOD (ROUTINE X 2)     Status: Normal (Preliminary result)   Collection Time   05/28/12 12:55 PM      Component Value Range Status Comment   Specimen Description BLOOD RIGHT ANTECUBITAL   Final    Special Requests BOTTLES DRAWN AEROBIC AND ANAEROBIC 10CC   Final    Culture  Setup Time 05/28/2012 19:22   Final    Culture     Final    Value:        BLOOD CULTURE RECEIVED NO GROWTH TO DATE CULTURE WILL BE HELD FOR 5 DAYS BEFORE ISSUING A FINAL NEGATIVE REPORT   Report Status PENDING   Incomplete   CULTURE, BLOOD (ROUTINE X 2)     Status: Normal (Preliminary result)   Collection Time   05/28/12  1:05 PM      Component Value Range Status Comment   Specimen Description BLOOD HAND RIGHT   Final    Special Requests BOTTLES DRAWN AEROBIC ONLY 10CC   Final    Culture  Setup Time 05/28/2012 19:23   Final  Culture     Final    Value:        BLOOD CULTURE RECEIVED NO GROWTH TO DATE CULTURE WILL BE HELD FOR 5 DAYS BEFORE ISSUING A FINAL NEGATIVE REPORT   Report Status PENDING   Incomplete     Studies/Results: Dg Chest 2 View  05/31/2012  *RADIOLOGY REPORT*  Clinical Data: Shortness of breath on exertion.  Follow-up right pleural effusion.  CHEST - 2 VIEW  Comparison: 05/30/2012  Findings: The mild cardiac enlargement with mild pulmonary vascular congestion demonstrating some improvement since previous study. Bilateral pleural effusions, stable on the right and improved on the left.  Atelectasis in the lung bases.  Degenerative changes in the spine.  Calcification of the aorta.  No pneumothorax.  IMPRESSION: Mild interval improvement of pulmonary vascular congestion and left pleural effusion.  Right pleural effusion and bilateral basilar atelectasis are unchanged.   Original Report Authenticated By: Burman Nieves, M.D.    Dg Chest Port 1 View  05/30/2012  *RADIOLOGY REPORT*  Clinical Data: Pulmonary edema  PORTABLE CHEST - 1 VIEW  Comparison: 05/28/2012  Findings: Normal heart size.  Bilateral effusions stable. Bibasilar pulmonary opacities right greater than left stable. Vascular congestion has not significantly changed.  No pneumothorax.  IMPRESSION: Stable bilateral airspace disease, bilateral pleural effusions, and vascular congestion.   Original Report Authenticated By: Jolaine Click, M.D.     Medications: Scheduled Meds:    . beta carotene w/minerals  1 tablet Oral Daily  . calcium carbonate  1 tablet Oral Daily  . dipyridamole-aspirin  1 capsule Oral BID  . docusate sodium  100 mg Oral BID  . enoxaparin (LOVENOX) injection  30 mg Subcutaneous Q24H  . feeding supplement  237 mL Oral BID BM  . ferrous sulfate  325 mg Oral Q breakfast  . folic acid  1 mg Oral Daily  . furosemide  40 mg Oral BID  . levothyroxine  100 mcg Oral QAC breakfast  . lidocaine  1 patch Transdermal Q24H  . multivitamin with minerals  1 tablet Oral Daily  . pantoprazole  40 mg Oral Daily  . simvastatin  40 mg Oral QPM  . sodium chloride  3 mL Intravenous Q12H  . sodium chloride  3 mL Intravenous Q12H  . traMADol  50 mg Oral TID   Continuous Infusions:    . diltiazem (CARDIZEM) infusion     PRN Meds:.sodium chloride, acetaminophen, acetaminophen, gabapentin, HYDROcodone-acetaminophen, sodium chloride  Assessment/Plan:  Principal Problem:  *Acute on chronic diastolic congestive heart failure Active Problems:  Pleural effusion  Chronic respiratory failure with hypoxia  ARF (acute renal failure)  Failure to thrive  CKD (chronic kidney disease) stage 3, GFR 30-59 ml/min  Post zoster neuralgia  Atrial fibrillation with RVR    Acute on Chronic Diastolic CHF -Is 3.7 L negative since admission. -Continue home dose of lasix of 40 mg BID for now. -Aim for continued negative fluid balance. -CXR this  am  with improved vascular congestion. -Diamox has been discontinued.  AFIB with RVR -?new onset. -No documented history of a fib in the past. -Will start on cardizem bolus and infusion. -She certainly qualifies for anticoagulation per CHADS2 criteria. Will await Dr. York Spaniel recommendations. -Appreciate cards input.  Chronic Hypoxemic Respiratory Failure -Believed 2/2 COPD, CHF, ?interstitial lung disease +/- effects of right pleural effusion. -Has been on oxygen 2-3L for a couple months. -Do not believe she has a PNA as she has no leukocytosis, cough or fever.  Recurrent Right  Pleural Effusion -Has required 2 thoracentesis in the past month. -Not significantly SOB or hypoxic over baseline. -Prior fluid studies c/w transudate, so likely related to her CHF/hypoalbuminemia. Cytology to be sent with this thoracentesis. -Continue diuresis. -Appreciate pulmonary input and plan for possible thoracentesis today. -Pulmonary agrees with no further thoracentesis.  Chronic Back Pain -Has been attributed to post-zoster neuralgia. -Continue neurontin and PRN pain meds.  Acute on CKD Stage III -Appears to be at baseline.  Hypothyroidism -TSH ok. -Continue home dose of synthroid.  FTT/Deconditioning -Therapy services have updated their recommendations to SNF. -Per SW bed is ready when she is medically stable.   Disposition -DC to SNF once medically stable. -Daughter, [name], has been called and updated on plan of care.   Time spent coordinating care: 45 minutes.   LOS: 3 days   HERNANDEZ ACOSTA,ESTELA [facility] Pager: [phone] 05/31/2012, 9:09 AM

## 2012-05-31 NOTE — Progress Notes (Signed)
PT Cancellation Note  Patient Details Name: [name] MRN: [id number] DOB: Nov 08, 1924   Cancelled Treatment:    Reason Eval/Treat Not Completed: Medical issues which prohibited therapy (HR up in 150's; Nursing asked for PT to HOLD). [facility] [phone] (pager)     INGOLD,Makenze Ellett 05/31/2012, 1:13 PM

## 2012-05-31 NOTE — Procedures (Signed)
Thoracentesis Procedure Note  Pre-operative Diagnosis: Recurrent Rt pleural effusion  Post-operative Diagnosis: same  Indications: Recurrent Rt pleural effusion  Procedure Details  Consent: Informed consent was obtained. Risks of the procedure were discussed including: infection, bleeding, pain, pneumothorax.  Under sterile conditions the patient was positioned. Betadine solution and sterile drapes were utilized.  1% plain lidocaine was used to anesthetize the Rt 7th rib space. Fluid was obtained without any difficulties and minimal blood loss.  A dressing was applied to the wound and wound care instructions were provided.   Performed with u/s guidance.  Findings 1300 ml of clear pleural fluid was obtained. A sample was sent for glucose, LDH, protein, cell count with differential, gram stain and culture, cytology, and flow cytometry.  Complications:  None; patient tolerated the procedure well.          Condition: stable  Plan A follow up chest x-ray was ordered..  Attending Attestation: I was present for the entire procedure.  Coralyn Helling, MD [facility] Pulmonary/Critical Care 05/31/2012, 10:47 AM Pager:  [phone] After 3pm call: [phone]

## 2012-05-31 NOTE — Progress Notes (Signed)
Physical Therapy Patient Details Name: KAROL SKARZYNSKI MRN: [id number] DOB: 06/04/1924 Today's Date: 05/31/2012 -     PT Assessment / Plan / Recommendation Comments on Treatment Session   Unable to treat patient today secondary to high HR.  Spoke with Smitty Pluck, OT re: D/C plan.  Eileen Stanford states that daughter will not be able to provide 24 hour care.  Therefore, patient will need SNF for therapy.  Thanks.    Follow Up Recommendations   SNF                           Frequency   3x week         Visit Information  Last PT Received On: 05/31/12 Reason Eval/Treat Not Completed: Medical issues which prohibited therapy (HR up in 150's; Nursing asked for PT to HOLD)                             INGOLD,Calyx Hawker 05/31/2012, 1:14 PM  [facility] [phone] (pager)

## 2012-05-31 NOTE — Clinical Social Work Note (Signed)
Pt has bed ready at [facility] if Pt is ready for DC. CSW will continue to follow up to facilitate dc once Pt is medically cleared.   Frederico Hamman, LCSW [phone]

## 2012-05-31 NOTE — Progress Notes (Signed)
PULMONARY  / CRITICAL CARE MEDICINE  Name: YUVAL NOLET MRN: [id number] DOB: 05/01/1925    LOS: 3  REFERRING PROVIDER:  Peggye Pitt  CHIEF COMPLAINT:  Short of breath  BRIEF PATIENT DESCRIPTION:  79 yo female former smoker admitted on 05/28/2012 due to weakness, dyspnea, and hypoxia with acute diastolic CHF, pulmonary edema, and recurrent Rt pleural effusion.  PCCM consulted 1/15 to assess dyspnea, hypoxia, and pleural effusion.  Significant PMHx of CVA, Anemia, hyperlipidemia, Hypothyroidism, HTN  CULTURES: Blood 1/14 >>  ANTIBIOTICS: Vancomycin 1/14 >> 1/14 Zosyn 1/14 >> 1/14   EVENTS:   TESTS: !06/18/11 Echo >> mod LVH, EF 55%, mild AR, mod MR, mod LA dilation, PAS 44 mmHg 04/18/12 Rt thoracentesis >> protein 2.5, LDH 76, WBC 575 (1N, 80L, 61M), cx negative, cytology >> reactive mesothelial cells and small lymphocytes 05/31/12 Rt thoracentesis  INTERVAL HISTORY:  Developed a fib overnight.  Remains on oxygen.  VITAL SIGNS: Temp:  [97.1 F (36.2 C)-97.5 F (36.4 C)] 97.4 F (36.3 C) (01/17 0700) Pulse Rate:  [76-150] 150  (01/17 0700) Resp:  [17-18] 17  (01/17 0517) BP: (103-135)/(33-64) 115/64 mmHg (01/17 0700) SpO2:  [96 %-100 %] 96 % (01/17 0700) Weight:  [76.4 kg (168 lb 6.9 oz)] 76.4 kg (168 lb 6.9 oz) (01/17 0517)  PHYSICAL EXAMINATION: General:  No distress Neuro:  Normal strength, decreased hearing HEENT:  Blind in Rt eye Cardiovascular:  s1s2 irregular, 2/6 SM Lungs:  Decreased BS Rt base, no wheeze Abdomen:  Soft, non-tender Musculoskeletal:  No edema Skin:  No rashes   Lab 05/30/12 0630 05/29/12 0608 05/28/12 2115 05/28/12 1105 05/28/12 0950  NA 140 139 -- 139 --  K 4.0 4.1 -- 4.3 --  CL 95* 95* -- 90* --  CO2 39* 38* -- -- 37*  BUN 53* 60* -- 73* --  CREATININE 1.39* 1.34* 1.44* -- --  GLUCOSE 99 105* -- 120* --    Lab 05/30/12 0630 05/29/12 0608 05/28/12 2115  HGB 9.8* 9.7* 9.5*  HCT 31.7* 31.3* 30.6*  WBC 5.6 5.8 5.5  PLT 257 233  237   Dg Chest 2 View  05/31/2012  *RADIOLOGY REPORT*  Clinical Data: Shortness of breath on exertion.  Follow-up right pleural effusion.  CHEST - 2 VIEW  Comparison: 05/30/2012  Findings: The mild cardiac enlargement with mild pulmonary vascular congestion demonstrating some improvement since previous study. Bilateral pleural effusions, stable on the right and improved on the left.  Atelectasis in the lung bases.  Degenerative changes in the spine.  Calcification of the aorta.  No pneumothorax.  IMPRESSION: Mild interval improvement of pulmonary vascular congestion and left pleural effusion.  Right pleural effusion and bilateral basilar atelectasis are unchanged.   Original Report Authenticated By: Burman Nieves, M.D.    Dg Chest Port 1 View  05/30/2012  *RADIOLOGY REPORT*  Clinical Data: Pulmonary edema  PORTABLE CHEST - 1 VIEW  Comparison: 05/28/2012  Findings: Normal heart size.  Bilateral effusions stable. Bibasilar pulmonary opacities right greater than left stable. Vascular congestion has not significantly changed.  No pneumothorax.  IMPRESSION: Stable bilateral airspace disease, bilateral pleural effusions, and vascular congestion.   Original Report Authenticated By: Jolaine Click, M.D.     ASSESSMENT / PLAN:  Acute on chronic hypoxic respiratory failure from acute pulmonary edema and recurrent Rt pleural effusion. Etiology remains unclear.  Plan: C/o diuresis per cardiology and primary team Adjust oxygen to keep SpO2 > 92% Will go ahead w/ thoracentesis 1/17  Acute on chronic  diastolic CHF, valvular heart disease. Plan: Per cardiology and primary team  New AF w/ RVR plan Primary svc starting cardiazem   Coralyn Helling, MD [facility] Pulmonary/Critical Care 05/31/2012, 10:41 AM Pager:  [phone] After 3pm call: [phone]

## 2012-05-31 NOTE — Progress Notes (Signed)
Subjective:  Went into rapid atrial fibrillation this am. Currently in NSR after being placed on cardiazem. Does not feel well. Thoracentesis of 1300 cc fluid this am.   Objective:  Vital Signs in the last 24 hours: BP 115/63  Pulse 86  Temp 97.4 F (36.3 C) (Oral)  Resp 17  Ht 5\' 4"  (1.626 m)  Wt 76.4 kg (168 lb 6.9 oz)  BMI 28.91 kg/m2  SpO2 96%  Physical Exam: Elderly WF in NAD Lungs:  Reduced BS right base, mild rales left base  Cardiac:  Regular rhythm with PVC's , normal S1 and S2, no S3 2/6 systolic murmur Abdomen:  Soft, nontender, no masses Extremities:  No edema present  Intake/Output from previous day: 01/16 0701 - 01/17 0700 In: 480 [P.O.:480] Out: 3501 [Urine:3500; Stool:1] Weight Filed Weights   05/29/12 0454 05/30/12 0633 05/31/12 0517  Weight: 76.1 kg (167 lb 12.3 oz) 77 kg (169 lb 12.1 oz) 76.4 kg (168 lb 6.9 oz)    Lab Results: Basic Metabolic Panel:  Basename 05/30/12 0630 05/29/12 0608  NA 140 139  K 4.0 4.1  CL 95* 95*  CO2 39* 38*  GLUCOSE 99 105*  BUN 53* 60*  CREATININE 1.39* 1.34*    CBC:  Basename 05/30/12 0630 05/29/12 0608  WBC 5.6 5.8  NEUTROABS -- --  HGB 9.8* 9.7*  HCT 31.7* 31.3*  MCV 97.8 98.7  PLT 257 233    BNP    Component Value Date/Time   PROBNP 1884.0* 05/28/2012 0950   Telemetry: Sinus with PVC's   Assessment/Plan:  1. Dyspnea is likely multifactorial both pulmonary and cardiac as well as related to pleural effusion 2. Mitral and aortic valve disease, not candidate for intervention 3. CKD stage 3-4 4. Pleural effusion 5. New onset of atrial fibrillation   Rec:  Had repeat thoracentesis.  She has both valvular heart disease and diastolic CHF.  Currently on diltiazem IV.  I would change to oral diltiazem once stable.  Issues that need to be addressed are diuresis to the point of worsening renal insufficiency although I am still not convinced that this is all due to CHF.    Also need to decide if she  is a candidate for long term coumadin.  Jillian Palmer  MD [facility] Cardiology  05/31/2012, 1:33 PM

## 2012-06-01 MED ORDER — DILTIAZEM HCL 25 MG/5ML IV SOLN
10.0000 mg | Freq: Once | INTRAVENOUS | Status: AC
  Administered 2012-06-02: 10 mg via INTRAVENOUS
  Filled 2012-06-01: qty 5

## 2012-06-01 MED ORDER — FERROUS SULFATE 325 (65 FE) MG PO TABS
325.0000 mg | ORAL_TABLET | Freq: Every day | ORAL | Status: DC
  Administered 2012-06-01 – 2012-06-02 (×2): 325 mg via ORAL
  Filled 2012-06-01 (×3): qty 1

## 2012-06-01 MED ORDER — ATORVASTATIN CALCIUM 20 MG PO TABS
20.0000 mg | ORAL_TABLET | Freq: Every day | ORAL | Status: DC
  Administered 2012-06-01 – 2012-06-02 (×2): 20 mg via ORAL
  Filled 2012-06-01 (×3): qty 1

## 2012-06-01 MED ORDER — DILTIAZEM HCL ER COATED BEADS 180 MG PO CP24
180.0000 mg | ORAL_CAPSULE | Freq: Every day | ORAL | Status: DC
  Administered 2012-06-01 – 2012-06-03 (×3): 180 mg via ORAL
  Filled 2012-06-01 (×5): qty 1

## 2012-06-01 NOTE — Progress Notes (Signed)
Pharmacy note re: statin formulary  Patient has been on Zocor 40 mg daily. Diltiazem added today; maxiumum dose of Zocor while on Diltiazem is 10 mg daily.  Per [employer] Policy, Rx has substituted Atorvastatin 20 mg daily for Zocor 40 mg daily.   Conisder changing outpatient statin at discharge.  Thank you, Nicolette Bang, [place] Pager: [phone] 06/01/2012 10:10am

## 2012-06-01 NOTE — Progress Notes (Signed)
Subjective:  No atrial fib over night.  Still weak and deconditioned.  Mild SOB today  Objective:  Vital Signs in the last 24 hours: BP 114/41  Pulse 71  Temp 97.3 F (36.3 C) (Oral)  Resp 18  Ht 5\' 4"  (1.626 m)  Wt 72.077 kg (158 lb 14.4 oz)  BMI 27.28 kg/m2  SpO2 98%  Physical Exam: Elderly WF in NAD Lungs:  Reduced BS right base, mild rales left base  Cardiac:  Regular rhythm with PVC's , normal S1 and S2, no S3 2/6 systolic murmur Abdomen:  Soft, nontender, no masses Extremities:  No edema present  Intake/Output from previous day: 01/17 0701 - 01/18 0700 In: 720 [P.O.:720] Out: 1400 [Urine:1400] Weight Filed Weights   05/30/12 0633 05/31/12 0517 06/01/12 0433  Weight: 77 kg (169 lb 12.1 oz) 76.4 kg (168 lb 6.9 oz) 72.077 kg (158 lb 14.4 oz)    Lab Results: Basic Metabolic Panel:  Basename 05/30/12 0630  NA 140  K 4.0  CL 95*  CO2 39*  GLUCOSE 99  BUN 53*  CREATININE 1.39*    CBC:  Basename 05/30/12 0630  WBC 5.6  NEUTROABS --  HGB 9.8*  HCT 31.7*  MCV 97.8  PLT 257    BNP    Component Value Date/Time   PROBNP 1884.0* 05/28/2012 0950   Telemetry: Sinus with PVC's   Assessment/Plan:  1. Dyspnea is likely multifactorial both pulmonary and cardiac as well as related to pleural effusion 2. Mitral and aortic valve disease, not candidate for intervention 3. CKD stage 3-4 4. Pleural effusion 5.  Atrial fibrillation improved  Rec:  Change to oral diltiazem. Continue diuresis.  Not sure if I believe the weights today.   Also need to decide if she is a candidate for long term coumadin.   Darden Palmer  MD [facility] Cardiology  06/01/2012, 9:52 AM

## 2012-06-01 NOTE — Progress Notes (Signed)
Pleural fluid shows transudate so far, 400 WBC's with 80% lymphs.  Sats are excellent on low flow oxygen.  Await flow cytometry, cytology, etc. Will see again on Monday .

## 2012-06-01 NOTE — Progress Notes (Signed)
[facility]             Progress Note   Subjective: No complaints today other than very mild SOB.  Objective: Vital signs in last 24 hours: Temp:  [97.3 F (36.3 C)-97.9 F (36.6 C)] 97.6 F (36.4 C) (01/18 1338) Pulse Rate:  [71-89] 89  (01/18 1338) Resp:  [18] 18  (01/18 1338) BP: (111-152)/(41-68) 152/68 mmHg (01/18 1338) SpO2:  [93 %-98 %] 93 % (01/18 1338) Weight:  [72.077 kg (158 lb 14.4 oz)] 72.077 kg (158 lb 14.4 oz) (01/18 0433) Weight change: -4.323 kg (-9 lb 8.5 oz) Last BM Date: 05/30/12  Intake/Output from previous day: 01/17 0701 - 01/18 0700 In: 720 [P.O.:720] Out: 1400 [Urine:1400] Total I/O In: -  Out: 600 [Urine:600]   Physical Exam: General: Alert, awake, oriented x3. HEENT: No bruits, no goiter. Heart: prominent systolic murmur. Lungs: Decreased breath sounds right base. Abdomen: Soft, nontender, nondistended, positive bowel sounds. Extremities: Trace pedal edema, +pedal pulses. Neuro: Grossly intact, nonfocal. I have not ambulated her.    Lab Results: Basic Metabolic Panel:  Basename 05/30/12 0630  NA 140  K 4.0  CL 95*  CO2 39*  GLUCOSE 99  BUN 53*  CREATININE 1.39*  CALCIUM 9.5  MG --  PHOS --   Liver Function Tests:  Basename 05/30/12 0630  AST 19  ALT 8  ALKPHOS 74  BILITOT 0.2*  PROT 6.8  ALBUMIN 2.8*   CBC:  Basename 05/30/12 0630  WBC 5.6  NEUTROABS --  HGB 9.8*  HCT 31.7*  MCV 97.8  PLT 257     Recent Results (from the past 240 hour(s))  CULTURE, BLOOD (ROUTINE X 2)     Status: Normal (Preliminary result)   Collection Time   05/28/12 12:55 PM      Component Value Range Status Comment   Specimen Description BLOOD RIGHT ANTECUBITAL   Final    Special Requests BOTTLES DRAWN AEROBIC AND ANAEROBIC 10CC   Final    Culture  Setup Time 05/28/2012 19:22   Final    Culture     Final    Value:        BLOOD CULTURE RECEIVED NO GROWTH TO DATE CULTURE WILL BE HELD FOR 5 DAYS BEFORE ISSUING A FINAL  NEGATIVE REPORT   Report Status PENDING   Incomplete   CULTURE, BLOOD (ROUTINE X 2)     Status: Normal (Preliminary result)   Collection Time   05/28/12  1:05 PM      Component Value Range Status Comment   Specimen Description BLOOD HAND RIGHT   Final    Special Requests BOTTLES DRAWN AEROBIC ONLY 10CC   Final    Culture  Setup Time 05/28/2012 19:23   Final    Culture     Final    Value:        BLOOD CULTURE RECEIVED NO GROWTH TO DATE CULTURE WILL BE HELD FOR 5 DAYS BEFORE ISSUING A FINAL NEGATIVE REPORT   Report Status PENDING   Incomplete   BODY FLUID CULTURE     Status: Normal (Preliminary result)   Collection Time   05/31/12 10:56 AM      Component Value Range Status Comment   Specimen Description FLUID RIGHT PLEURAL   Final    Special Requests Normal   Final    Gram Stain     Final    Value: RARE WBC PRESENT, PREDOMINANTLY MONONUCLEAR     NO ORGANISMS SEEN   Culture PENDING  Incomplete    Report Status PENDING   Incomplete     Studies/Results: Dg Chest 2 View  05/31/2012  *RADIOLOGY REPORT*  Clinical Data: Shortness of breath on exertion.  Follow-up right pleural effusion.  CHEST - 2 VIEW  Comparison: 05/30/2012  Findings: The mild cardiac enlargement with mild pulmonary vascular congestion demonstrating some improvement since previous study. Bilateral pleural effusions, stable on the right and improved on the left.  Atelectasis in the lung bases.  Degenerative changes in the spine.  Calcification of the aorta.  No pneumothorax.  IMPRESSION: Mild interval improvement of pulmonary vascular congestion and left pleural effusion.  Right pleural effusion and bilateral basilar atelectasis are unchanged.   Original Report Authenticated By: Burman Nieves, M.D.    Dg Chest Port 1 View  05/31/2012  *RADIOLOGY REPORT*  Clinical Data: Status post thoracentesis.  PORTABLE CHEST - 1 VIEW  Comparison: Two-view chest 05/31/2012.  Findings: The right pleural effusion is markedly decreased.  There  is no pneumothorax.  A small left pleural effusion and scarring is evident.  Minimal right basilar atelectasis is noted.  Heart is mildly enlarged.  IMPRESSION:  1.  Significant decrease and a right pleural effusion without evidence for pneumothorax. 2.  Stable small left pleural effusion and scarring/atelectasis. 3.  Stable cardiomegaly.   Original Report Authenticated By: Marin Roberts, M.D.     Medications: Scheduled Meds:    . atorvastatin  20 mg Oral q1800  . beta carotene w/minerals  1 tablet Oral Daily  . calcium carbonate  1 tablet Oral Daily  . diltiazem  180 mg Oral Daily  . dipyridamole-aspirin  1 capsule Oral BID  . docusate sodium  100 mg Oral BID  . enoxaparin (LOVENOX) injection  30 mg Subcutaneous Q24H  . feeding supplement  237 mL Oral BID BM  . ferrous sulfate  325 mg Oral QHS  . folic acid  1 mg Oral Daily  . furosemide  40 mg Oral BID  . levothyroxine  100 mcg Oral QAC breakfast  . lidocaine  1 patch Transdermal Q24H  . multivitamin with minerals  1 tablet Oral Daily  . pantoprazole  40 mg Oral Daily  . sodium chloride  3 mL Intravenous Q12H  . sodium chloride  3 mL Intravenous Q12H  . traMADol  50 mg Oral TID   Continuous Infusions:   PRN Meds:.sodium chloride, acetaminophen, acetaminophen, gabapentin, HYDROcodone-acetaminophen, sodium chloride  Assessment/Plan:  Principal Problem:  *Acute on chronic diastolic congestive heart failure Active Problems:  Pleural effusion  Chronic respiratory failure with hypoxia  ARF (acute renal failure)  Failure to thrive  CKD (chronic kidney disease) stage 3, GFR 30-59 ml/min  Post zoster neuralgia  Atrial fibrillation with RVR    Acute on Chronic Diastolic CHF -Is 5.3 L negative since admission. -Continue home dose of lasix of 40 mg BID for now. -Aim for continued negative fluid balance. -CXR  with improved vascular congestion. -Diamox has been discontinued.  AFIB with RVR -?new onset. -No documented  history of a fib in the past. -Has converted back to NSR. -IV cardizem has been transitioned to PO today. -She certainly qualifies for anticoagulation per CHADS2 criteria. Will await Dr. York Spaniel recommendations. -Appreciate cards input.  Chronic Hypoxemic Respiratory Failure -Believed 2/2 COPD, CHF, ?interstitial lung disease +/- effects of right pleural effusion. -Has been on oxygen 2-3L for a couple months. -Do not believe she has a PNA as she has no leukocytosis, cough or fever.  Recurrent Right Pleural Effusion -Has  required 2 thoracentesis in the past month. -So far, fluid studies looking like transudate. Cytology pending. -S/p repeat thoracentesis on 1/17.  Chronic Back Pain -Has been attributed to post-zoster neuralgia. -Continue neurontin and PRN pain meds.  Acute on CKD Stage III -Appears to be at baseline.  Hypothyroidism -TSH ok. -Continue home dose of synthroid.  FTT/Deconditioning -Therapy services have updated their recommendations to SNF. -Per SW bed is ready when she is medically stable.   Disposition -DC to SNF once medically stable. -Daughter, [name], has been called and updated on plan of care.   Time spent coordinating care: 35 minutes.   LOS: 4 days   [facility] Pager: [phone] 06/01/2012, 3:58 PM

## 2012-06-02 LAB — BASIC METABOLIC PANEL
BUN: 56 mg/dL — ABNORMAL HIGH (ref 6–23)
Calcium: 9.5 mg/dL (ref 8.4–10.5)
Chloride: 93 mEq/L — ABNORMAL LOW (ref 96–112)
Creatinine, Ser: 1.57 mg/dL — ABNORMAL HIGH (ref 0.50–1.10)
GFR calc Af Amer: 34 mL/min — ABNORMAL LOW (ref >90)
GFR calc non Af Amer: 29 mL/min — ABNORMAL LOW (ref >90)

## 2012-06-02 MED ORDER — DILTIAZEM HCL 60 MG PO TABS
60.0000 mg | ORAL_TABLET | Freq: Once | ORAL | Status: AC
  Administered 2012-06-02: 60 mg via ORAL
  Filled 2012-06-02: qty 1

## 2012-06-02 MED ORDER — SODIUM CHLORIDE 0.9 % IV SOLN
INTRAVENOUS | Status: DC | PRN
  Administered 2012-06-02: 10 mL/h via INTRAVENOUS

## 2012-06-02 MED ORDER — DILTIAZEM HCL 100 MG IV SOLR
5.0000 mg/h | INTRAVENOUS | Status: DC
  Administered 2012-06-02: 5 mg/h via INTRAVENOUS
  Administered 2012-06-02: 15 mg/h via INTRAVENOUS
  Filled 2012-06-02 (×2): qty 100

## 2012-06-02 MED ORDER — SODIUM CHLORIDE 0.9 % IV BOLUS (SEPSIS)
250.0000 mL | Freq: Once | INTRAVENOUS | Status: AC
  Administered 2012-06-02: 250 mL via INTRAVENOUS

## 2012-06-02 MED ORDER — AMIODARONE HCL 200 MG PO TABS
200.0000 mg | ORAL_TABLET | Freq: Two times a day (BID) | ORAL | Status: DC
  Administered 2012-06-02 – 2012-06-03 (×3): 200 mg via ORAL
  Filled 2012-06-02 (×4): qty 1

## 2012-06-02 NOTE — Progress Notes (Signed)
Pt went back into A fib with RVR tonight, she was given a dose of cardizem 10mg  IV bolus with little effect so was placed back on a cardizem drip. Currently the cardizem is running at 15mg /hr and her heart rate remains in the 130s. I spoke with Dr. Katha Cabal (cardiology) who recommended giving another PO dose of cardizem now while the drip is running to see if we can't convert her back to NSR, if not effective may need to give a dose of digoxin. Her blood pressure is in the mid 90s systolic, will give a small bolus of NS to support her pressures with the additional cardizem she will be receiving.

## 2012-06-02 NOTE — Progress Notes (Signed)
[facility]             Progress Note   Subjective: Had recurrent a fib overnight. Now back in NSR.  Objective: Vital signs in last 24 hours: Temp:  [97.6 F (36.4 C)-99.3 F (37.4 C)] 99.3 F (37.4 C) (01/19 0419) Pulse Rate:  [89-140] 97  (01/19 0808) Resp:  [18-20] 20  (01/18 2046) BP: (93-152)/(38-76) 114/52 mmHg (01/19 0918) SpO2:  [93 %-94 %] 93 % (01/19 0419) Weight:  [71.396 kg (157 lb 6.4 oz)] 71.396 kg (157 lb 6.4 oz) (01/19 0408) Weight change: -0.68 kg (-1 lb 8 oz) Last BM Date: 05/30/12  Intake/Output from previous day: 01/18 0701 - 01/19 0700 In: -  Out: 1300 [Urine:1300] Total I/O In: 3 [I.V.:3] Out: 150 [Urine:150]   Physical Exam: General: Alert, awake, oriented x3. HEENT: No bruits, no goiter. Heart: prominent systolic murmur. Lungs: Decreased breath sounds right base. Abdomen: Soft, nontender, nondistended, positive bowel sounds. Extremities: Trace pedal edema, +pedal pulses. Neuro: Grossly intact, nonfocal. I have not ambulated her.    Lab Results: Basic Metabolic Panel:  Basename 06/02/12 0605  NA 138  K 3.9  CL 93*  CO2 38*  GLUCOSE 156*  BUN 56*  CREATININE 1.57*  CALCIUM 9.5  MG --  PHOS --      Recent Results (from the past 240 hour(s))  CULTURE, BLOOD (ROUTINE X 2)     Status: Normal (Preliminary result)   Collection Time   05/28/12 12:55 PM      Component Value Range Status Comment   Specimen Description BLOOD RIGHT ANTECUBITAL   Final    Special Requests BOTTLES DRAWN AEROBIC AND ANAEROBIC 10CC   Final    Culture  Setup Time 05/28/2012 19:22   Final    Culture     Final    Value:        BLOOD CULTURE RECEIVED NO GROWTH TO DATE CULTURE WILL BE HELD FOR 5 DAYS BEFORE ISSUING A FINAL NEGATIVE REPORT   Report Status PENDING   Incomplete   CULTURE, BLOOD (ROUTINE X 2)     Status: Normal (Preliminary result)   Collection Time   05/28/12  1:05 PM      Component Value Range Status Comment   Specimen Description  BLOOD HAND RIGHT   Final    Special Requests BOTTLES DRAWN AEROBIC ONLY 10CC   Final    Culture  Setup Time 05/28/2012 19:23   Final    Culture     Final    Value:        BLOOD CULTURE RECEIVED NO GROWTH TO DATE CULTURE WILL BE HELD FOR 5 DAYS BEFORE ISSUING A FINAL NEGATIVE REPORT   Report Status PENDING   Incomplete   BODY FLUID CULTURE     Status: Normal (Preliminary result)   Collection Time   05/31/12 10:56 AM      Component Value Range Status Comment   Specimen Description FLUID RIGHT PLEURAL   Final    Special Requests Normal   Final    Gram Stain     Final    Value: RARE WBC PRESENT, PREDOMINANTLY MONONUCLEAR     NO ORGANISMS SEEN   Culture NO GROWTH 1 DAY   Final    Report Status PENDING   Incomplete     Studies/Results: No results found.  Medications: Scheduled Meds:    . amiodarone  200 mg Oral BID  . atorvastatin  20 mg Oral q1800  . beta carotene w/minerals  1 tablet Oral Daily  . calcium carbonate  1 tablet Oral Daily  . diltiazem  180 mg Oral Daily  . dipyridamole-aspirin  1 capsule Oral BID  . docusate sodium  100 mg Oral BID  . enoxaparin (LOVENOX) injection  30 mg Subcutaneous Q24H  . feeding supplement  237 mL Oral BID BM  . ferrous sulfate  325 mg Oral QHS  . folic acid  1 mg Oral Daily  . furosemide  40 mg Oral BID  . levothyroxine  100 mcg Oral QAC breakfast  . lidocaine  1 patch Transdermal Q24H  . multivitamin with minerals  1 tablet Oral Daily  . pantoprazole  40 mg Oral Daily  . sodium chloride  3 mL Intravenous Q12H  . sodium chloride  3 mL Intravenous Q12H  . traMADol  50 mg Oral TID   Continuous Infusions:    . sodium chloride 10 mL/hr (06/02/12 0138)  . diltiazem (CARDIZEM) infusion Stopped (06/02/12 1038)   PRN Meds:.sodium chloride, sodium chloride, acetaminophen, acetaminophen, gabapentin, HYDROcodone-acetaminophen, sodium chloride  Assessment/Plan:  Principal Problem:  *Acute on chronic diastolic congestive heart  failure Active Problems:  Pleural effusion  Chronic respiratory failure with hypoxia  ARF (acute renal failure)  Failure to thrive  CKD (chronic kidney disease) stage 3, GFR 30-59 ml/min  Post zoster neuralgia  Atrial fibrillation with RVR    Acute on Chronic Diastolic CHF -Is 6.1 L negative since admission. -Continue home dose of lasix of 40 mg BID for now. -Aim for continued negative fluid balance. -CXR  with improved vascular congestion. -Diamox has been discontinued.  AFIB with RVR -?new onset. -Recurrent overnight -No documented history of a fib in the past. -Has converted back to NSR. -Cards has added amiodarone. -Will transition IV cardizem to PO today. -She certainly qualifies for anticoagulation per CHADS2 criteria. Will await Dr. York Spaniel recommendations. -Appreciate cards input.  Chronic Hypoxemic Respiratory Failure -Believed 2/2 COPD, CHF, ?interstitial lung disease +/- effects of right pleural effusion. -Has been on oxygen 2-3L for a couple months. -Do not believe she has a PNA as she has no leukocytosis, cough or fever.  Recurrent Right Pleural Effusion -Has required 2 thoracentesis in the past month. -So far, fluid studies looking like transudate. Cytology pending. -S/p repeat thoracentesis on 1/17.  Chronic Back Pain -Has been attributed to post-zoster neuralgia. -Continue neurontin and PRN pain meds.  Acute on CKD Stage III -Appears to be at baseline.  Hypothyroidism -TSH ok. -Continue home dose of synthroid.  FTT/Deconditioning -Therapy services have updated their recommendations to SNF. -Per SW bed is ready when she is medically stable.   Disposition -DC to SNF once medically stable.   Time spent coordinating care: 35 minutes.   LOS: 5 days   [facility] Pager: [phone] 06/02/2012, 12:07 PM

## 2012-06-02 NOTE — Progress Notes (Signed)
Wyn Forster PA sent page pt HR still bouncing 120's to 140's. Has droped as low as 107 for less then a second and back up 130's 140's. BP 110/60. Orders given to restart Cardizem drip at 5 mg per and call if systolic goes down to 90.

## 2012-06-02 NOTE — Progress Notes (Signed)
Page sent to L.  Harduk PA Pt A-fib 120's up to 150. Staying in the 130's. BP 122/64. Order given for IV bolus Cardizem 10 mg.

## 2012-06-02 NOTE — Progress Notes (Signed)
Subjective:  Recurrent atrial fibrillation with RVR last night.  It was symptomatic.   Objective:  Vital Signs in the last 24 hours: BP 114/52  Pulse 97  Temp 99.3 F (37.4 C) (Oral)  Resp 20  Ht 5\' 4"  (1.626 m)  Wt 71.396 kg (157 lb 6.4 oz)  BMI 27.02 kg/m2  SpO2 93%  Physical Exam: Elderly WF in NAD Lungs:  Reduced BS right base, mild rales left base  Cardiac:  Regular rhythm with PVC's , normal S1 and S2, no S3 2/6 systolic murmur Abdomen:  Soft, nontender, no masses Extremities:  No edema present  Intake/Output from previous day: 01/18 0701 - 01/19 0700 In: -  Out: 1300 [Urine:1300] Weight Filed Weights   05/31/12 0517 06/01/12 0433 06/02/12 0408  Weight: 76.4 kg (168 lb 6.9 oz) 72.077 kg (158 lb 14.4 oz) 71.396 kg (157 lb 6.4 oz)    Lab Results: Basic Metabolic Panel:  Basename 06/02/12 0605  NA 138  K 3.9  CL 93*  CO2 38*  GLUCOSE 156*  BUN 56*  CREATININE 1.57*    CBC:   BNP    Component Value Date/Time   PROBNP 1884.0* 05/28/2012 0950   Telemetry: Sinus with PVC's   Assessment/Plan:  1. Dyspnea is likely multifactorial both pulmonary and cardiac as well as related to pleural effusion 2. Mitral and aortic valve disease, not candidate for intervention 3. CKD stage 3-4 4. Pleural effusion 5.  Atrial fibrillation recurrent with RVR  Rec:  I would begin low dose amiodarone to see if it will suppress atrial fibrillation.  Continue to diurese.  Need to determine if whe would be a long term coumadin candidate.  Her CHADS2 VASC score is 5   W. Ashley Royalty  MD [facility]  06/02/2012, 9:43 AM

## 2012-06-02 NOTE — Progress Notes (Signed)
Sent Page to L. Harduk PA at 0400. Pt HR 110's to 150's. Few SR beats and also some occasional a- flutter. BP 103/41. Cardizem drip @ 15 cc/hr. No reply at this time. Will send second page.

## 2012-06-03 LAB — BASIC METABOLIC PANEL
CO2: 40 mEq/L (ref 19–32)
Calcium: 9.6 mg/dL (ref 8.4–10.5)
Creatinine, Ser: 1.56 mg/dL — ABNORMAL HIGH (ref 0.50–1.10)
GFR calc Af Amer: 34 mL/min — ABNORMAL LOW (ref >90)
GFR calc non Af Amer: 29 mL/min — ABNORMAL LOW (ref >90)
Sodium: 137 mEq/L (ref 135–145)

## 2012-06-03 LAB — CULTURE, BLOOD (ROUTINE X 2)
Culture: NO GROWTH
Culture: NO GROWTH

## 2012-06-03 LAB — BODY FLUID CULTURE

## 2012-06-03 LAB — PATHOLOGIST SMEAR REVIEW

## 2012-06-03 MED ORDER — AMIODARONE HCL 200 MG PO TABS: 200.0000 mg | ORAL_TABLET | Freq: Two times a day (BID) | ORAL | Status: DC

## 2012-06-03 MED ORDER — WARFARIN SODIUM 5 MG PO TABS: 5.0000 mg | ORAL_TABLET | Freq: Every day | ORAL | Status: DC

## 2012-06-03 MED ORDER — DILTIAZEM HCL ER COATED BEADS 180 MG PO CP24: 180.0000 mg | ORAL_CAPSULE | Freq: Every day | ORAL | Status: DC

## 2012-06-03 MED ORDER — HYDROCODONE-ACETAMINOPHEN 5-325 MG PO TABS: 1.0000 | ORAL_TABLET | ORAL | Status: DC | PRN

## 2012-06-03 MED ORDER — DSS 100 MG PO CAPS: 100.0000 mg | ORAL_CAPSULE | Freq: Two times a day (BID) | ORAL | Status: DC

## 2012-06-03 NOTE — Progress Notes (Signed)
Report called to Nurse Chad, at [place], patient awake, alert and oriented awaiting ride to nursing home.  No complaints at this time.  Earney Mallet, RN

## 2012-06-03 NOTE — Progress Notes (Signed)
Page sent to L. Harduk PA. Pt CO2 level 40.

## 2012-06-03 NOTE — Progress Notes (Signed)
PULMONARY  / CRITICAL CARE MEDICINE  Name: [name] MRN: [id number] DOB: [birth year]    LOS: 6  REFERRING PROVIDER:  Peggye Pitt  CHIEF COMPLAINT:  Short of breath  BRIEF PATIENT DESCRIPTION:  79 yo female former smoker admitted on 05/28/2012 due to weakness, dyspnea, and hypoxia with acute diastolic CHF, pulmonary edema, and recurrent Rt pleural effusion.  PCCM consulted 1/15 to assess dyspnea, hypoxia, and pleural effusion.  Significant PMHx of CVA, Anemia, hyperlipidemia, Hypothyroidism, HTN  CULTURES: Blood 1/14 >>neg  ANTIBIOTICS: Vancomycin 1/14 >> 1/14 Zosyn 1/14 >> 1/14   EVENTS:   TESTS: !06/18/11 Echo >> mod LVH, EF 55%, mild AR, mod MR, mod LA dilation, PAS 44 mmHg 04/18/12 Rt thoracentesis >> protein 2.5, LDH 76, WBC 575 (1N, 80L, 15M), cx negative, cytology >> reactive mesothelial cells and small lymphocytes 05/31/12 Rt thoracentesis  INTERVAL HISTORY:  Developed a fib overnight.  Remains on oxygen.  VITAL SIGNS: Temp:  [97.8 F (36.6 C)-98 F (36.7 C)] 97.8 F (36.6 C) (01/20 0412) Pulse Rate:  [76-88] 88  (01/20 0806) Resp:  [18-19] 19  (01/20 0412) BP: (129-133)/(50-60) 133/56 mmHg (01/20 0806) SpO2:  [97 %-98 %] 97 % (01/20 0412) Weight:  [72.394 kg (159 lb 9.6 oz)] 72.394 kg (159 lb 9.6 oz) (01/20 0414)  PHYSICAL EXAMINATION: General:  No distress Neuro:  Normal strength, decreased hearing HEENT:  Blind in Rt eye Cardiovascular:  s1s2 irregular, 2/6 SM Lungs:  Decreased BS Rt base, no wheeze Abdomen:  Soft, non-tender Musculoskeletal:  No edema Skin:  No rashes   Lab 06/03/12 0516 06/02/12 0605 05/30/12 0630  NA 137 138 140  K 3.8 3.9 4.0  CL 93* 93* 95*  CO2 40* 38* 39*  BUN 48* 56* 53*  CREATININE 1.56* 1.57* 1.39*  GLUCOSE 153* 156* 99    Lab 05/30/12 0630 05/29/12 0608 05/28/12 2115  HGB 9.8* 9.7* 9.5*  HCT 31.7* 31.3* 30.6*  WBC 5.6 5.8 5.5  PLT 257 233 237   No results found.  ASSESSMENT / PLAN:  Acute on chronic  hypoxic respiratory failure from acute pulmonary edema and recurrent Rt pleural effusion. Etiology remains unclear, but suspect due to CHF \\Plan : C/o diuresis per cardiology and primary team Adjust oxygen to keep SpO2 > 92% F/u the FLOW cytometry outpt; the fluid is transudative, suspect this is related to volume status  Acute on chronic diastolic CHF, valvular heart disease. Plan: Per cardiology and primary team  New AF w/ RVR plan Primary svc starting cardiazem and on amio   Steve Minor ACNP [facility] Pager [phone] till 3 pm If no answer page [phone] 06/03/2012, 10:57 AM

## 2012-06-03 NOTE — Discharge Summary (Signed)
Physician Discharge Summary  Patient ID: [name] MRN: [id number] DOB/AGE: February 02, 1925 79 y.o.  Admit date: 05/28/2012 Discharge date: 06/03/2012  Primary Care Physician:  [name], MD   Discharge Diagnoses:    Principal Problem:  *Acute on chronic diastolic congestive heart failure Active Problems:  Pleural effusion  Chronic respiratory failure with hypoxia  ARF (acute renal failure)  Failure to thrive  CKD (chronic kidney disease) stage 3, GFR 30-59 ml/min  Post zoster neuralgia  Atrial fibrillation with RVR      Medication List     As of 06/03/2012 11:10 AM    STOP taking these medications         acetaZOLAMIDE 250 MG tablet   Commonly known as: DIAMOX      metoprolol tartrate 12.5 mg Tabs   Commonly known as: LOPRESSOR      oxyCODONE-acetaminophen 5-325 MG per tablet   Commonly known as: PERCOCET/ROXICET      TAKE these medications         amiodarone 200 MG tablet   Commonly known as: PACERONE   Take 1 tablet (200 mg total) by mouth 2 (two) times daily.      beta carotene w/minerals tablet   Take 1 tablet by mouth daily.      calcium carbonate 1250 MG tablet   Commonly known as: OS-CAL - dosed in mg of elemental calcium   Take 1 tablet by mouth daily.      cholecalciferol 1000 UNITS tablet   Commonly known as: VITAMIN D   Take 1,000 Units by mouth daily.      diltiazem 180 MG 24 hr capsule   Commonly known as: CARDIZEM CD   Take 1 capsule (180 mg total) by mouth daily.      dipyridamole-aspirin 200-25 MG per 12 hr capsule   Commonly known as: AGGRENOX   Take 1 capsule by mouth 2 (two) times daily.      DSS 100 MG Caps   Take 100 mg by mouth 2 (two) times daily.      folic acid 1 MG tablet   Commonly known as: FOLVITE   Take 1 mg by mouth daily.      furosemide 40 MG tablet   Commonly known as: LASIX   Take 40 mg by mouth 2 (two) times daily.      gabapentin 100 MG capsule   Commonly known as: NEURONTIN   Take 100 mg by mouth 3  (three) times daily as needed.      glucosamine-chondroitin 500-400 MG tablet   Take 1 tablet by mouth 3 (three) times daily.      HYDROcodone-acetaminophen 5-325 MG per tablet   Commonly known as: NORCO/VICODIN   Take 1-2 tablets by mouth every 4 (four) hours as needed.      Iron 325 (65 FE) MG Tabs   Take 1 tablet by mouth daily.      levothyroxine 100 MCG tablet   Commonly known as: SYNTHROID, LEVOTHROID   Take 100 mcg by mouth daily.      lidocaine 5 %   Commonly known as: LIDODERM   Place 1 patch onto the skin daily. Remove & Discard patch within 12 hours or as directed by MD      methocarbamol 500 MG tablet   Commonly known as: ROBAXIN   Take 500 mg by mouth 3 (three) times daily as needed. Muscle spasms      multivitamin with minerals Tabs   Take 1 tablet by mouth  daily.      omeprazole 40 MG capsule   Commonly known as: PRILOSEC   Take 40 mg by mouth daily.      simvastatin 40 MG tablet   Commonly known as: ZOCOR   Take 40 mg by mouth every evening.      traMADol 50 MG tablet   Commonly known as: ULTRAM   Take 50 mg by mouth 3 (three) times daily. For pain relief      warfarin 5 MG tablet   Commonly known as: COUMADIN   Take 1 tablet (5 mg total) by mouth daily.          Disposition and Follow-up:  Will be discharged to SNF today. Should follow with Her cardiologist in 2 weeks. Has been newly started on coumadin and this will need to be followed at the facility.  Consults:  Cardiology, Dr. Donnie Aho.         Pulmonary, Dr. Craige Cotta   Significant Diagnostic Studies:  No results found.  Brief H and P: For complete details please refer to admission H and P, but in brief patient is an 79 y.o. female with PMH hearth failure, recurrent right side pleural effusion 2 times over last 2 months. Patient was brought to the ED by daughter today. Patient was not able to ambulate. Patient went to bathroom was sitting in the commode and was not able to stand up. Patient  had an episode of jerking of arms and leg. No bowel or urine incontinence. Patient was found to be hypoxic sat at 80 % in the ED, increase to 96 on 2 l. Per daughter no worsening of cough. Patient relates she is breathing better since she arrived to ED. We were asked to admit her for further evaluation and management.     Hospital Course:  Principal Problem:  *Acute on chronic diastolic congestive heart failure Active Problems:  Pleural effusion  Chronic respiratory failure with hypoxia  ARF (acute renal failure)  Failure to thrive  CKD (chronic kidney disease) stage 3, GFR 30-59 ml/min  Post zoster neuralgia  Atrial fibrillation with RVR    Acute on Chronic Diastolic CHF  -Is 7.5 L negative since admission.  -Continue home dose of lasix of 40 mg BID for now.  -Aim for continued negative fluid balance.  -CXR with improved vascular congestion.  -Diamox has been discontinued.   AFIB with RVR  -?new onset.  -Recurrent overnight  -No documented history of a fib in the past.  -Has converted back to NSR.  -Cards has added amiodarone.  -Will transition IV cardizem to PO today.  -Her CHADS2VA2SC score is at least 6, so we have decided to start her on anticoagulation. -Will start coumadin and this will need to be followed at her facility. -Appreciate cards input.   Chronic Hypoxemic Respiratory Failure  -Believed 2/2 COPD, CHF, ?interstitial lung disease +/- effects of right pleural effusion.  -Has been on oxygen 2-3L for a couple months.  -Do not believe she has a PNA as she has no leukocytosis, cough or fever.   Recurrent Right Pleural Effusion  -Has required 3 thoracentesis in the past month.  -So far, fluid studies looking like transudate. Cytology pending.  -S/p repeat thoracentesis on 1/17.   Chronic Back Pain  -Has been attributed to post-zoster neuralgia.  -Continue neurontin and PRN pain meds.   Acute on CKD Stage III  -Appears to be at baseline.   Hypothyroidism   -TSH ok.  -Continue home dose of  synthroid.   FTT/Deconditioning  -Therapy services have updated their recommendations to SNF.  -SNF bed ready today.    Time spent on Discharge: Greater than 30 minutes.  SignedChaya Jan [facility] Pager: [phone] 06/03/2012, 11:10 AM

## 2012-06-03 NOTE — Progress Notes (Signed)
Subjective:  Started on amiodarone and has been in sinus rhythm since yesterday.  To go to SNF today.  She was on warfarin a number of years ago and aken off because of an eye hemorrhage and changed to Aggranox. Breathing is better. No chest pain. Objective:  Vital Signs in the last 24 hours: BP 133/56  Pulse 88  Temp 97.8 F (36.6 C) (Oral)  Resp 19  Ht 5\' 4"  (1.626 m)  Wt 72.394 kg (159 lb 9.6 oz)  BMI 27.40 kg/m2  SpO2 97%  Physical Exam: Elderly WF in NAD Lungs:  Reduced BS right base, mild rales left base  Cardiac:  Regular rhythm with PVC's , normal S1 and S2, no S3 2/6 systolic murmur Abdomen:  Soft, nontender, no masses Extremities:  No edema present  Intake/Output from previous day: 01/19 0701 - 01/20 0700 In: 3 [I.V.:3] Out: 1500 [Urine:1500] Weight Filed Weights   06/02/12 0408 06/03/12 0412 06/03/12 0414  Weight: 71.396 kg (157 lb 6.4 oz) 72.394 kg (159 lb 9.6 oz) 72.394 kg (159 lb 9.6 oz)    Lab Results: Basic Metabolic Panel:  Basename 06/03/12 0516 06/02/12 0605  NA 137 138  K 3.8 3.9  CL 93* 93*  CO2 40* 38*  GLUCOSE 153* 156*  BUN 48* 56*  CREATININE 1.56* 1.57*    CBC:   BNP    Component Value Date/Time   PROBNP 1884.0* 05/28/2012 0950   Telemetry: Sinus with PVC's   Assessment/Plan:  1. Dyspnea is likely multifactorial both pulmonary and cardiac as well as related to pleural effusion 2. Mitral and aortic valve disease, not candidate for intervention 3. CKD stage 3-4 4. Pleural effusion 5.  Atrial fibrillation recurrent with RVR currently in NSR  Rec:  OK to got to SNF.  I discussed with Dr. Ardyth Harps about warfarin and would try to keep INR between 2 and 2.5  ALso amiodarone 200 BID for 2 weeks then go to once a day.  I need to see in office once she gets out of SNF.  She needs to watch daily weights and diurese.   Darden Palmer  MD [facility]  06/03/2012, 12:55 PM

## 2012-06-04 NOTE — Clinical Social Work Note (Signed)
Late entry 06/04/12 for 06/03/12  CSW was notified by MD that Pt is cleared for DC. CSW contacted SNF [facility] and Pt's daughter to coordinate Pt's dc to SNF.  Pt's family will meet her at SNF. Pt will be transported by non-emergency ambulance d/t acuity.   No further CSW needs at this time.  Frederico Hamman, LCSW [phone]

## 2012-06-04 NOTE — Clinical Social Work Placement (Signed)
Clinical Social Work Department CLINICAL SOCIAL WORK PLACEMENT NOTE 06/04/2012  Patient:  [name], [name]  Account Number:  [id number] Admit date:  05/28/2012  Clinical Social Worker:  Frederico Hamman, LCSW  Date/time:  05/30/2012 05:00 PM  Clinical Social Work is seeking post-discharge placement for this patient at the following level of care:   SKILLED NURSING   (*CSW will update this form in Epic as items are completed)   05/30/2012  Patient/family provided with [facility] Department of Clinical Social Work's list of facilities offering this level of care within the geographic area requested by the patient (or if unable, by the patient's family).  05/30/2012  Patient/family informed of their freedom to choose among providers that offer the needed level of care, that participate in Medicare, Medicaid or managed care program needed by the patient, have an available bed and are willing to accept the patient.  05/30/2012  Patient/family informed of [facility]' ownership interest in [facility], as well as of the fact that they are under no obligation to receive care at this facility.  PASARR submitted to EDS on  PASARR number received from EDS on   FL2 transmitted to all facilities in geographic area requested by pt/family on  05/30/2012 FL2 transmitted to all facilities within larger geographic area on   Patient informed that his/her managed care company has contracts with or will negotiate with  certain facilities, including the following:     Patient/family informed of bed offers received:  05/30/2012 Patient chooses bed at [facility], [place] Physician recommends and patient chooses bed at    [facility] [place] on  06/03/2012 Patient to be transferred to facility by [facility]  The following physician request were entered in Epic:   Additional Comments: Pasarr on file  Frederico Hamman, [place] [phone]

## 2012-06-06 ENCOUNTER — Emergency Department (HOSPITAL_COMMUNITY): Payer: Medicare Other

## 2012-06-06 ENCOUNTER — Encounter (HOSPITAL_COMMUNITY): Payer: Self-pay | Admitting: *Deleted

## 2012-06-06 ENCOUNTER — Inpatient Hospital Stay (HOSPITAL_COMMUNITY): Payer: Medicare Other

## 2012-06-06 ENCOUNTER — Other Ambulatory Visit: Payer: Self-pay

## 2012-06-06 ENCOUNTER — Inpatient Hospital Stay (HOSPITAL_COMMUNITY)
Admission: EM | Admit: 2012-06-06 | Discharge: 2012-06-10 | DRG: 689 | Disposition: A | Discharge status: Skilled Nursing Facility | Payer: Medicare Other | Attending: Family Medicine | Admitting: Family Medicine

## 2012-06-06 DIAGNOSIS — J9611 Chronic respiratory failure with hypoxia: Secondary | ICD-10-CM

## 2012-06-06 DIAGNOSIS — Y9210 Unspecified residential institution as the place of occurrence of the external cause: Secondary | ICD-10-CM | POA: Diagnosis present

## 2012-06-06 DIAGNOSIS — R4182 Altered mental status, unspecified: Secondary | ICD-10-CM

## 2012-06-06 DIAGNOSIS — N390 Urinary tract infection, site not specified: Principal | ICD-10-CM

## 2012-06-06 DIAGNOSIS — N183 Chronic kidney disease, stage 3 unspecified: Secondary | ICD-10-CM

## 2012-06-06 DIAGNOSIS — Z7901 Long term (current) use of anticoagulants: Secondary | ICD-10-CM

## 2012-06-06 DIAGNOSIS — W19XXXA Unspecified fall, initial encounter: Secondary | ICD-10-CM | POA: Diagnosis present

## 2012-06-06 DIAGNOSIS — I2789 Other specified pulmonary heart diseases: Secondary | ICD-10-CM | POA: Diagnosis present

## 2012-06-06 DIAGNOSIS — Z79899 Other long term (current) drug therapy: Secondary | ICD-10-CM

## 2012-06-06 DIAGNOSIS — S91009A Unspecified open wound, unspecified ankle, initial encounter: Secondary | ICD-10-CM | POA: Diagnosis present

## 2012-06-06 DIAGNOSIS — G9341 Metabolic encephalopathy: Secondary | ICD-10-CM | POA: Diagnosis present

## 2012-06-06 DIAGNOSIS — E559 Vitamin D deficiency, unspecified: Secondary | ICD-10-CM | POA: Diagnosis present

## 2012-06-06 DIAGNOSIS — I130 Hypertensive heart and chronic kidney disease with heart failure and stage 1 through stage 4 chronic kidney disease, or unspecified chronic kidney disease: Secondary | ICD-10-CM | POA: Diagnosis present

## 2012-06-06 DIAGNOSIS — Z8673 Personal history of transient ischemic attack (TIA), and cerebral infarction without residual deficits: Secondary | ICD-10-CM

## 2012-06-06 DIAGNOSIS — E785 Hyperlipidemia, unspecified: Secondary | ICD-10-CM | POA: Diagnosis present

## 2012-06-06 DIAGNOSIS — I080 Rheumatic disorders of both mitral and aortic valves: Secondary | ICD-10-CM | POA: Diagnosis present

## 2012-06-06 DIAGNOSIS — N189 Chronic kidney disease, unspecified: Secondary | ICD-10-CM | POA: Diagnosis present

## 2012-06-06 DIAGNOSIS — N289 Disorder of kidney and ureter, unspecified: Secondary | ICD-10-CM

## 2012-06-06 DIAGNOSIS — I4891 Unspecified atrial fibrillation: Secondary | ICD-10-CM

## 2012-06-06 DIAGNOSIS — Z87891 Personal history of nicotine dependence: Secondary | ICD-10-CM

## 2012-06-06 DIAGNOSIS — G9340 Encephalopathy, unspecified: Secondary | ICD-10-CM

## 2012-06-06 DIAGNOSIS — H5440 Blindness, one eye, unspecified eye: Secondary | ICD-10-CM | POA: Diagnosis present

## 2012-06-06 DIAGNOSIS — R0902 Hypoxemia: Secondary | ICD-10-CM

## 2012-06-06 DIAGNOSIS — S51009A Unspecified open wound of unspecified elbow, initial encounter: Secondary | ICD-10-CM | POA: Diagnosis present

## 2012-06-06 DIAGNOSIS — N184 Chronic kidney disease, stage 4 (severe): Secondary | ICD-10-CM | POA: Diagnosis present

## 2012-06-06 DIAGNOSIS — J9610 Chronic respiratory failure, unspecified whether with hypoxia or hypercapnia: Secondary | ICD-10-CM

## 2012-06-06 DIAGNOSIS — N179 Acute kidney failure, unspecified: Secondary | ICD-10-CM

## 2012-06-06 DIAGNOSIS — Z888 Allergy status to other drugs, medicaments and biological substances status: Secondary | ICD-10-CM

## 2012-06-06 DIAGNOSIS — S81009A Unspecified open wound, unspecified knee, initial encounter: Secondary | ICD-10-CM | POA: Diagnosis present

## 2012-06-06 DIAGNOSIS — H3530 Unspecified macular degeneration: Secondary | ICD-10-CM | POA: Diagnosis present

## 2012-06-06 DIAGNOSIS — I5032 Chronic diastolic (congestive) heart failure: Secondary | ICD-10-CM

## 2012-06-06 DIAGNOSIS — Z792 Long term (current) use of antibiotics: Secondary | ICD-10-CM

## 2012-06-06 DIAGNOSIS — I509 Heart failure, unspecified: Secondary | ICD-10-CM | POA: Diagnosis present

## 2012-06-06 DIAGNOSIS — E039 Hypothyroidism, unspecified: Secondary | ICD-10-CM | POA: Diagnosis present

## 2012-06-06 HISTORY — DX: Urinary tract infection, site not specified: N39.0

## 2012-06-06 LAB — COMPREHENSIVE METABOLIC PANEL
AST: 28 U/L (ref 0–37)
Albumin: 3.6 g/dL (ref 3.5–5.2)
Alkaline Phosphatase: 97 U/L (ref 39–117)
BUN: 45 mg/dL — ABNORMAL HIGH (ref 6–23)
Chloride: 92 mEq/L — ABNORMAL LOW (ref 96–112)
Potassium: 3.8 mEq/L (ref 3.5–5.1)
Total Protein: 8.1 g/dL (ref 6.0–8.3)

## 2012-06-06 LAB — TSH: TSH: 3.245 u[IU]/mL (ref 0.350–4.500)

## 2012-06-06 LAB — PROTIME-INR: INR: 1.23 (ref 0.00–1.49)

## 2012-06-06 LAB — CBC
HCT: 33.5 % — ABNORMAL LOW (ref 36.0–46.0)
MCHC: 33.7 g/dL (ref 30.0–36.0)
Platelets: 401 10*3/uL — ABNORMAL HIGH (ref 150–400)
RDW: 13.8 % (ref 11.5–15.5)
WBC: 9.2 10*3/uL (ref 4.0–10.5)

## 2012-06-06 LAB — URINALYSIS, ROUTINE W REFLEX MICROSCOPIC
Glucose, UA: NEGATIVE mg/dL
Hgb urine dipstick: NEGATIVE
Ketones, ur: NEGATIVE mg/dL
Leukocytes, UA: NEGATIVE
pH: 6 (ref 5.0–8.0)

## 2012-06-06 MED ORDER — SODIUM CHLORIDE 0.9 % IV BOLUS (SEPSIS)
250.0000 mL | Freq: Once | INTRAVENOUS | Status: AC
  Administered 2012-06-06: 250 mL via INTRAVENOUS

## 2012-06-06 MED ORDER — SODIUM CHLORIDE 0.9 % IV SOLN: 250.0000 mL | INTRAVENOUS | Status: DC | PRN

## 2012-06-06 MED ORDER — AMIODARONE HCL 200 MG PO TABS
200.0000 mg | ORAL_TABLET | Freq: Two times a day (BID) | ORAL | Status: DC
  Administered 2012-06-06 – 2012-06-10 (×8): 200 mg via ORAL
  Filled 2012-06-06 (×9): qty 1

## 2012-06-06 MED ORDER — DILTIAZEM HCL ER COATED BEADS 180 MG PO CP24
180.0000 mg | ORAL_CAPSULE | Freq: Every day | ORAL | Status: DC
  Administered 2012-06-06 – 2012-06-10 (×5): 180 mg via ORAL
  Filled 2012-06-06 (×7): qty 1

## 2012-06-06 MED ORDER — LEVOTHYROXINE SODIUM 100 MCG PO TABS
100.0000 ug | ORAL_TABLET | Freq: Every day | ORAL | Status: DC
  Administered 2012-06-07 – 2012-06-10 (×4): 100 ug via ORAL
  Filled 2012-06-06 (×6): qty 1

## 2012-06-06 MED ORDER — SODIUM CHLORIDE 0.9 % IJ SOLN: 3.0000 mL | INTRAMUSCULAR | Status: DC | PRN

## 2012-06-06 MED ORDER — SODIUM CHLORIDE 0.9 % IJ SOLN
3.0000 mL | Freq: Two times a day (BID) | INTRAMUSCULAR | Status: DC
  Administered 2012-06-06 – 2012-06-09 (×8): 3 mL via INTRAVENOUS

## 2012-06-06 MED ORDER — FUROSEMIDE 80 MG PO TABS
80.0000 mg | ORAL_TABLET | Freq: Every morning | ORAL | Status: DC
  Administered 2012-06-07 – 2012-06-10 (×4): 80 mg via ORAL
  Filled 2012-06-06 (×4): qty 1

## 2012-06-06 MED ORDER — ACETAMINOPHEN 325 MG PO TABS
650.0000 mg | ORAL_TABLET | Freq: Four times a day (QID) | ORAL | Status: DC | PRN
  Administered 2012-06-07 – 2012-06-10 (×3): 650 mg via ORAL
  Filled 2012-06-06 (×3): qty 2

## 2012-06-06 MED ORDER — PANTOPRAZOLE SODIUM 40 MG PO TBEC
40.0000 mg | DELAYED_RELEASE_TABLET | Freq: Every day | ORAL | Status: DC
  Administered 2012-06-06 – 2012-06-10 (×5): 40 mg via ORAL
  Filled 2012-06-06 (×5): qty 1

## 2012-06-06 MED ORDER — WARFARIN SODIUM 4 MG PO TABS
4.0000 mg | ORAL_TABLET | ORAL | Status: AC
  Administered 2012-06-06: 4 mg via ORAL
  Filled 2012-06-06: qty 1

## 2012-06-06 MED ORDER — ACETAMINOPHEN 650 MG RE SUPP: 650.0000 mg | Freq: Four times a day (QID) | RECTAL | Status: DC | PRN

## 2012-06-06 MED ORDER — DEXTROSE 5 % IV SOLN
1.0000 g | INTRAVENOUS | Status: DC
  Administered 2012-06-06 – 2012-06-09 (×4): 1 g via INTRAVENOUS
  Filled 2012-06-06 (×5): qty 10

## 2012-06-06 MED ORDER — WARFARIN - PHARMACIST DOSING INPATIENT
Freq: Every day | Status: DC
  Administered 2012-06-07: 18:00:00

## 2012-06-06 MED ORDER — SIMVASTATIN 10 MG PO TABS
10.0000 mg | ORAL_TABLET | Freq: Every day | ORAL | Status: DC
  Administered 2012-06-06 – 2012-06-09 (×4): 10 mg via ORAL
  Filled 2012-06-06 (×5): qty 1

## 2012-06-06 NOTE — ED Notes (Signed)
Patient transported to CT 

## 2012-06-06 NOTE — Progress Notes (Signed)
Utilization review completed.  P.J. Mikhia Dusek,RN,BSN Case Manager [phone]  

## 2012-06-06 NOTE — ED Notes (Signed)
Admitting MD at bedside.

## 2012-06-06 NOTE — ED Provider Notes (Signed)
History     CSN: [id number]  Arrival date & time 06/06/12  0825   First MD Initiated Contact with Patient 06/06/12 0830      Chief Complaint  Patient presents with  . Altered Mental Status  . Fall    (Consider location/radiation/quality/duration/timing/severity/associated sxs/prior treatment) HPI A LEVEL 5 CAVEAT PERTAINS DUE TO ALTERED MENTAL STATUS Pt presents from nursing facility with increased confusion.  Per family this has been over the past 2 days.  She was recently discharged from hospital after treatment primarily for heart failure.  Also per family a urine was obtained at nursing facility which was negative.    Past Medical History  Diagnosis Date  . Stroke   . Anemia   . Hyperlipidemia   . Vitamin D deficiency   . Hypothyroid   . Pulmonary hypertension   . Chronic respiratory failure with hypoxia   . Blindness of right eye     Macular degeneration  . Macular degeneration   . Hypertensive heart disease 04/16/2012  . CHF (congestive heart failure)   . Hypertension   . UTI (urinary tract infection)   . Arthritis     KNEES    Past Surgical History  Procedure Date  . Cataract extraction, bilateral     Family History  Problem Relation Age of Onset  . Heart disease Mother   . Heart disease Father   . Heart disease Brother   . Breast cancer Daughter   . Lymphoma Brother     History  Substance Use Topics  . Smoking status: Former Smoker -- 0.5 packs/day for 50 years    Types: Cigarettes    Quit date: 05/15/1998  . Smokeless tobacco: Never Used  . Alcohol Use: No    OB History    Grav Para Term Preterm Abortions TAB SAB Ect Mult Living                  Review of Systems UNABLE TO OBTAIN ROS DUE TO LEVEL 5 CAVEAT  Allergies  Lisinopril  Home Medications   No current outpatient prescriptions on file.  BP 137/59  Pulse 102  Temp 98.3 F (36.8 C) (Oral)  Resp 18  Ht 5\' 4"  (1.626 m)  Wt 167 lb (75.751 kg)  BMI 28.67 kg/m2  SpO2  97% Vitals reviewed Physical Exam Physical Examination: General appearance - alert, chronically ill appearing and in no distress Mental status - alert, not oriented to person place or time Eyes - pupils equal and reactive, extraocular eye movements intact, conjunctival injection, no scleral icterus Mouth - mucous membranes moist, pharynx normal without lesions Chest - clear to auscultation, no wheezes, rales or rhonchi, symmetric air entry Heart - normal rate, regular rhythm, normal S1, S2, no murmurs, rubs, clicks or gallops Abdomen - soft, nontender, nondistended, no masses or organomegaly, nabs Neurological - alert, not oriented, obviously confused, asking nonsensical questions, moving all extremities Extremities - peripheral pulses normal, no pedal edema, no clubbing or cyanosis Skin - normal coloration and turgor, no rashes  ED Course  Procedures (including critical care time)   Date: 06/06/2012  Rate: 103  Rhythm: sinus tachycardia  QRS Axis: normal  Intervals: normal  ST/T Wave abnormalities: normal  Conduction Disutrbances:none  Narrative Interpretation:   Old EKG Reviewed: changes noted, sinus tachycardia has replaced rapid afib   Labs Reviewed  CBC - Abnormal; Notable for the following:    RBC 3.57 (*)     Hemoglobin 11.3 (*)     HCT  33.5 (*)     Platelets 401 (*)     All other components within normal limits  COMPREHENSIVE METABOLIC PANEL - Abnormal; Notable for the following:    Chloride 92 (*)     CO2 35 (*)     Glucose, Bld 158 (*)     BUN 45 (*)     Creatinine, Ser 1.62 (*)     GFR calc non Af Amer 28 (*)     GFR calc Af Amer 32 (*)     All other components within normal limits  BASIC METABOLIC PANEL - Abnormal; Notable for the following:    CO2 34 (*)     Glucose, Bld 140 (*)     BUN 39 (*)     Creatinine, Ser 1.50 (*)     GFR calc non Af Amer 31 (*)     GFR calc Af Amer 35 (*)     All other components within normal limits  CBC - Abnormal; Notable  for the following:    RBC 3.39 (*)     Hemoglobin 10.4 (*)     HCT 32.1 (*)     All other components within normal limits  PROTIME-INR - Abnormal; Notable for the following:    Prothrombin Time 16.2 (*)     All other components within normal limits  PROTIME-INR - Abnormal; Notable for the following:    Prothrombin Time 15.3 (*)     All other components within normal limits  URINALYSIS, ROUTINE W REFLEX MICROSCOPIC  TSH  URINE CULTURE   Dg Chest 2 View  06/06/2012  *RADIOLOGY REPORT*  Clinical Data: Altered mental status question fall, bruising right elbow  CHEST - 2 VIEW  Comparison: 05/31/2012  Findings: Lower lateral left costal margin excluded. Upper normal heart size. Atherosclerotic calcification aorta. Slight pulmonary vascular congestion. Right basilar effusion and atelectasis. Underlying emphysematous and bronchitic changes with atelectasis at left base. Minimal biapical scarring. No pneumothorax. Bones demineralized.  IMPRESSION: Upper normal heart size with slight pulmonary vascular congestion. Changes of COPD with left basilar atelectasis and increased atelectasis and pleural effusion at right base.   Original Report Authenticated By: Ulyses Southward, M.D.    Dg Elbow 2 Views Right  06/06/2012  *RADIOLOGY REPORT*  Clinical Data: Altered mental status, left elbow bruising question fall  RIGHT ELBOW - 2 VIEW  Comparison: None  Findings: Osseous demineralization. Joint spaces preserved. No acute fracture, dislocation or bone destruction. Regional soft tissue swelling greatest dorsal to the proximal forearm and elbow. No elbow joint effusion.  IMPRESSION: No acute osseous abnormalities.   Original Report Authenticated By: Ulyses Southward, M.D.    Ct Head Wo Contrast  06/06/2012  *RADIOLOGY REPORT*  Clinical Data: Altered mental status, confusion, fall, history stroke and pulmonary hypertension  CT HEAD WITHOUT CONTRAST  Technique:  Contiguous axial images were obtained from the base of the skull  through the vertex without contrast.  Comparison: 05/28/2012  Findings: Generalized atrophy. Normal ventricular morphology. No midline shift or mass effect. Otherwise normal appearance of brain parenchyma. No intracranial hemorrhage, mass lesion or evidence of acute infarction. No definite extra-axial fluid collections. Right phthisis bulbi. Extensive atherosclerotic calcification of internal carotid and vertebral arteries at skull base. Osseous demineralization. Visualized paranasal sinuses and mastoid air cells clear.  IMPRESSION: Age-related generalized atrophy. No definite acute intracranial abnormalities.   Original Report Authenticated By: Ulyses Southward, M.D.    Mr Brain Wo Contrast  06/06/2012  *RADIOLOGY REPORT*  Clinical Data: Confusion and hallucinations.  Delirium.  MRI HEAD WITHOUT CONTRAST  Technique:  Multiplanar, multiecho pulse sequences of the brain and surrounding structures were obtained according to standard protocol without intravenous contrast.  Comparison: CT 06/06/2012  Findings: Normal brain volume for age .  Small white matter hyperintensities bilaterally compatible with mild chronic microvascular ischemia.  Brainstem and cerebellum are normal.  No acute infarct.  Negative for intracranial hemorrhage.  No subdural fluid collection.  No mass or edema is present.  Ventricle size is normal.  Paranasal sinuses are clear. Mild mucosal thickening right mastoid sinus.  Chronic atrophy of the right eye.  IMPRESSION: No acute abnormality.  Age appropriate atrophy and chronic microvascular ischemic change.   Original Report Authenticated By: Janeece Riggers, M.D.      1. Altered mental status   2. Renal insufficiency   3. Acute encephalopathy   4. UTI (lower urinary tract infection)   5. Atrial fibrillation   6. Chronic diastolic CHF (congestive heart failure)   7. Chronic respiratory failure with hypoxia       MDM  Pt presenting with altered mental status from nursing facility.  Labs and  urinalysis are reassuring, head CT without any acute findings.  No specific explanation found for encephalopathy in ED workup.  D/w [facility] for admission for further evaluation and management.         Ethelda Chick, MD 06/07/12 0830

## 2012-06-06 NOTE — ED Notes (Signed)
Patient sent from nursing facility for evaluation of confusion x 2 days, patient alert to self at this time, per facility patient will have intermittent episodes of confusion.  Patient also fell this morning at facility and has skin tears to elbow and lower left leg, no loc reported,

## 2012-06-06 NOTE — Progress Notes (Signed)
ANTICOAGULATION CONSULT NOTE - Initial Consult  Pharmacy Consult for Coumadin Indication: atrial fibrillation  Allergies  Allergen Reactions  . Lisinopril Other (See Comments)    pancreatitis   Patient Measurements: Height: 5\' 4"  (162.6 cm) Weight: 167 lb (75.751 kg) IBW/kg (Calculated) : 54.7   Vital Signs: Temp: 98 F (36.7 C) (01/23 1800) Temp src: Oral (01/23 1800) BP: 161/91 mmHg (01/23 1800) Pulse Rate: 102  (01/23 1800)  Labs:  Basename 06/06/12 2006 06/06/12 0900  HGB -- 11.3*  HCT -- 33.5*  PLT -- 401*  APTT -- --  LABPROT 15.3* --  INR 1.23 --  HEPARINUNFRC -- --  CREATININE -- 1.62*  CKTOTAL -- --  CKMB -- --  TROPONINI -- --    Estimated Creatinine Clearance: 25.3 ml/min (by C-G formula based on Cr of 1.62).  Medical History: Past Medical History  Diagnosis Date  . Stroke   . Anemia   . Hyperlipidemia   . Vitamin D deficiency   . Hypothyroid   . Pulmonary hypertension   . Chronic respiratory failure with hypoxia   . Blindness of right eye     Macular degeneration  . Macular degeneration   . Hypertensive heart disease 04/16/2012  . CHF (congestive heart failure)   . Hypertension   . UTI (urinary tract infection)   . Arthritis     KNEES   Medications:  Prescriptions prior to admission  Medication Sig Dispense Refill  . amiodarone (PACERONE) 200 MG tablet Take 200 mg by mouth 2 (two) times daily.      . beta carotene w/minerals (OCUVITE) tablet Take 1 tablet by mouth daily.      . calcium carbonate (OS-CAL - DOSED IN MG OF ELEMENTAL CALCIUM) 1250 MG tablet Take 1 tablet by mouth daily.      . cefTRIAXone (ROCEPHIN) 1 G injection Inject 1 g into the muscle every 3 (three) days. With lidocaine 1%      . cholecalciferol (VITAMIN D) 1000 UNITS tablet Take 1,000 Units by mouth daily.      Marland Kitchen diltiazem (CARDIZEM CD) 180 MG 24 hr capsule Take 180 mg by mouth daily.      Tery Sanfilippo Sodium (DSS) 100 MG CAPS Take 100 mg by mouth 2 (two) times daily.       . Ferrous Sulfate (IRON) 325 (65 FE) MG TABS Take 1 tablet by mouth daily.      . folic acid (FOLVITE) 1 MG tablet Take 1 mg by mouth daily.      . furosemide (LASIX) 40 MG tablet Take 40 mg by mouth at bedtime.       . furosemide (LASIX) 80 MG tablet Take 80 mg by mouth every morning.      . gabapentin (NEURONTIN) 100 MG capsule Take 100 mg by mouth 3 (three) times daily as needed. For pain      . glucosamine-chondroitin 500-400 MG tablet Take 1 tablet by mouth 3 (three) times daily.      Marland Kitchen HYDROcodone-acetaminophen (NORCO/VICODIN) 5-325 MG per tablet Take 1-2 tablets by mouth every 4 (four) hours as needed. For moderate pain      . levothyroxine (SYNTHROID, LEVOTHROID) 100 MCG tablet Take 100 mcg by mouth daily.      Marland Kitchen lidocaine (LIDODERM) 5 % Place 1 patch onto the skin daily. Remove & Discard patch within 12 hours or as directed by MD      . methocarbamol (ROBAXIN) 500 MG tablet Take 500 mg by mouth 3 (three) times  daily as needed. Muscle spasms      . Multiple Vitamin (MULITIVITAMIN WITH MINERALS) TABS Take 1 tablet by mouth daily.      Marland Kitchen omeprazole (PRILOSEC) 40 MG capsule Take 40 mg by mouth daily.      . simvastatin (ZOCOR) 10 MG tablet Take 10 mg by mouth at bedtime.      . traMADol (ULTRAM) 50 MG tablet Take 50 mg by mouth 3 (three) times daily. For pain relief      . warfarin (COUMADIN) 5 MG tablet Take 5 mg by mouth daily.        Assessment: 79 y/o female with two hospitalizations in the last 3 weeks brought to the ED for confusion. Pharmacy consulted to manage Coumadin for Afib. Amiodarone and Coumadin were started on discharge 1/20 with last dose of Coumadin on 1/22, interaction noted. Patient was given Coumadin 5 mg daily. INR is subtherapeutic today. No bleeding noted, H/H are low but improved from last admit, platelets are normal.  Goal of Therapy:  INR 2-2.5 per MD notes   Plan:  -Coumadin 4 mg po tonight -INR daily  [facility] [place], [address].D., BCPS Clinical  Pharmacist Pager: [phone] 06/06/2012 8:54 PM

## 2012-06-06 NOTE — H&P (Signed)
History and Physical  [name]:[id number] DOB: 1924/12/29 DOA: 06/06/2012  Referring physician: Jerelyn Scott, MD PCP: [name], MD  Pulmonologist: [name], M.D.  Cardiologist: [name], M.D.  Chief Complaint: Confusion  HPI:  79 year old woman with 2 hospitalizations in the last 3 weeks presented from skilled nursing facility with 48 hours of confusion, hallucinations. Initial workup in the emergency department was unrevealing the patient was referred for further evaluation.  History obtained from daughter at bedside as well as the facility in chart review. Patient unable to offer meaningful history secondary to confusion.  This has been hospitalized twice this month for acute heart failure and recurrent right pleural effusion. On her most recent hospitalization she develop atrial fibrillation was started on amiodarone and warfarin. On discharge Percocet was stopped and Lortab was substituted. She is been on tramadol for some time. Recently started gabapentin approximately 2 weeks. Daughter reports the patient has been hallucinating, seeing dead relatives, this began 06-16-2022. Speech has been fluent and clear though not always appropriate. No focal neurologic deficits were noted.  Per [facility] staff Joice Lofts: On arrival to the facility 1/20 staff noted good participation. 2022-06-16 patient was noted to be confused, hallucinating, talking about dead relatives and not participating as usual. After urine specimen was obtained at nursing facility patient was given one dose of Rocephin 1/22 PM. Urine cultures pending. Patient had 2 falls over the last 24 hours, most recently sustaining skin tear to right elbow and left lower leg. Patient has not been given any narcotics, benzodiazepines, sleep aids are gabapentin while at facility.  Records from nursing facility: Urinalysis small leukocyte esterase, negative nitrate, 0-2 white blood cells, no bacteria seen. TSH 3.72, CBC unremarkable, CMP  unremarkable, chest x-ray negative.  In the emergency department noted to be afebrile with stable vital signs. Mild renal insufficiency seen. CBC unremarkable. Urinalysis negative. CT head negative. Chest x-ray again demonstrated pleural effusion, unchanged. EKG sinus tachycardia, no acute changes.  On last discharge medications included warfarin, amiodarone, Lortab. Next   Chart Review: 05/16/2012: progressive shortness of breath, orthopnea, dyspnea on exertion right upper back pain. Chest x-ray demonstrated large right pleural effusion and patient referred for admission. 05/28/2012: Acute/chronic congestive heart failure  Review of Systems:  Unobtainable from patient. As far as is known: Negative for fever, visual changes, sore throat, rash, new muscle aches, chest pain, SOB, dysuria, bleeding, n/v/abdominal pain.  Past Medical History  Diagnosis Date  . Stroke   . Anemia   . Hyperlipidemia   . Vitamin D deficiency   . Hypothyroid   . Pulmonary hypertension   . Chronic respiratory failure with hypoxia   . Blindness of right eye     Macular degeneration  . Macular degeneration   . Hypertensive heart disease 04/16/2012    Past Surgical History  Procedure Date  . Cataract extraction, bilateral     Social History:  reports that she quit smoking about 14 years ago. Her smoking use included Cigarettes. She has a 25 pack-year smoking history. She has never used smokeless tobacco. She reports that she does not drink alcohol or use illicit drugs.  Allergies  Allergen Reactions  . Lisinopril Other (See Comments)    pancreatitis    Family History  Problem Relation Age of Onset  . Heart disease Mother   . Heart disease Father   . Heart disease Brother   . Breast cancer Daughter   . Lymphoma Brother      Prior to Admission medications  Medication Sig Start Date End Date Taking? Authorizing Provider  amiodarone (PACERONE) 200 MG tablet Take 200 mg by mouth 2 (two) times daily.  06/03/12  Yes Henderson Cloud, MD  beta carotene w/minerals (OCUVITE) tablet Take 1 tablet by mouth daily.   Yes Historical Provider, MD  calcium carbonate (OS-CAL - DOSED IN MG OF ELEMENTAL CALCIUM) 1250 MG tablet Take 1 tablet by mouth daily.   Yes Historical Provider, MD  cefTRIAXone (ROCEPHIN) 1 G injection Inject 1 g into the muscle every 3 (three) days. With lidocaine 1%   Yes Historical Provider, MD  cholecalciferol (VITAMIN D) 1000 UNITS tablet Take 1,000 Units by mouth daily.   Yes Historical Provider, MD  diltiazem (CARDIZEM CD) 180 MG 24 hr capsule Take 180 mg by mouth daily. 06/03/12  Yes Estela Isaiah Blakes, MD  Docusate Sodium (DSS) 100 MG CAPS Take 100 mg by mouth 2 (two) times daily. 06/03/12  Yes Estela Isaiah Blakes, MD  Ferrous Sulfate (IRON) 325 (65 FE) MG TABS Take 1 tablet by mouth daily.   Yes Historical Provider, MD  folic acid (FOLVITE) 1 MG tablet Take 1 mg by mouth daily.   Yes Historical Provider, MD  furosemide (LASIX) 40 MG tablet Take 40 mg by mouth at bedtime.  05/24/12  Yes Dorothea Ogle, MD  furosemide (LASIX) 80 MG tablet Take 80 mg by mouth every morning.   Yes Historical Provider, MD  gabapentin (NEURONTIN) 100 MG capsule Take 100 mg by mouth 3 (three) times daily as needed. For pain 05/24/12  Yes Dorothea Ogle, MD  glucosamine-chondroitin 500-400 MG tablet Take 1 tablet by mouth 3 (three) times daily.   Yes Historical Provider, MD  HYDROcodone-acetaminophen (NORCO/VICODIN) 5-325 MG per tablet Take 1-2 tablets by mouth every 4 (four) hours as needed. For moderate pain 06/03/12  Yes Estela Isaiah Blakes, MD  levothyroxine (SYNTHROID, LEVOTHROID) 100 MCG tablet Take 100 mcg by mouth daily.   Yes Historical Provider, MD  lidocaine (LIDODERM) 5 % Place 1 patch onto the skin daily. Remove & Discard patch within 12 hours or as directed by MD 05/24/12  Yes Dorothea Ogle, MD  methocarbamol (ROBAXIN) 500 MG tablet Take 500 mg by mouth 3 (three) times  daily as needed. Muscle spasms   Yes Historical Provider, MD  Multiple Vitamin (MULITIVITAMIN WITH MINERALS) TABS Take 1 tablet by mouth daily.   Yes Historical Provider, MD  omeprazole (PRILOSEC) 40 MG capsule Take 40 mg by mouth daily.   Yes Historical Provider, MD  simvastatin (ZOCOR) 10 MG tablet Take 10 mg by mouth at bedtime.   Yes Historical Provider, MD  traMADol (ULTRAM) 50 MG tablet Take 50 mg by mouth 3 (three) times daily. For pain relief   Yes Historical Provider, MD  warfarin (COUMADIN) 5 MG tablet Take 5 mg by mouth daily. 06/03/12  Yes Estela Isaiah Blakes, MD   Physical Exam: Filed Vitals:   06/06/12 0827 06/06/12 0900 06/06/12 1025  BP: 167/73 164/99 151/62  Pulse: 101 104   Temp: 98.1 F (36.7 C)    TempSrc: Oral    Resp: 20 21 23   SpO2: 97% 98% 98%     General:  Examined in the emergency department. Appears calm and comfortable. Confused. Eyes: Left eye pupil round and reactive, artificial lens, sclera and irises appear unremarkable. Right eye deviated laterally, corneal scarred. ENT: grossly normal hearing, lips & tongue Neck: no LAD, masses or thyromegaly Cardiovascular: RRR, no m/r/g. No  LE edema. Respiratory: CTA bilaterally, no w/r/r. Normal respiratory effort. Abdomen: soft, ntnd Skin: Superficial skin tear left lower leg and right elbow Steri-Stripped by the emergency department. Back appears unremarkable without bruising. Lidocaine patch present on back. Musculoskeletal: grossly normal tone and strength BUE/BLE Psychiatric: Alert, spontaneous and inappropriate speech which is clear. Oriented to self, daughter at bedside, month, year, not location. Neurologic: grossly non-focal. Able to move in bed without difficulty.  Wt Readings from Last 3 Encounters:  06/03/12 72.394 kg (159 lb 9.6 oz)  05/25/12 74.481 kg (164 lb 3.2 oz)  04/21/12 73.6 kg (162 lb 4.1 oz)    Labs on Admission:  Basic Metabolic Panel:  Lab 06/06/12 1610 06/03/12 0516 06/02/12  0605  NA 141 137 138  K 3.8 3.8 3.9  CL 92* 93* 93*  CO2 35* 40* 38*  GLUCOSE 158* 153* 156*  BUN 45* 48* 56*  CREATININE 1.62* 1.56* 1.57*  CALCIUM 9.9 9.6 9.5  MG -- -- --  PHOS -- -- --    Liver Function Tests:  Lab 06/06/12 0900  AST 28  ALT 15  ALKPHOS 97  BILITOT 0.3  PROT 8.1  ALBUMIN 3.6    CBC:  Lab 06/06/12 0900  WBC 9.2  NEUTROABS --  HGB 11.3*  HCT 33.5*  MCV 93.8  PLT 401*    Radiological Exams on Admission: Dg Chest 2 View  06/06/2012  *RADIOLOGY REPORT*  Clinical Data: Altered mental status question fall, bruising right elbow  CHEST - 2 VIEW  Comparison: 05/31/2012  Findings: Lower lateral left costal margin excluded. Upper normal heart size. Atherosclerotic calcification aorta. Slight pulmonary vascular congestion. Right basilar effusion and atelectasis. Underlying emphysematous and bronchitic changes with atelectasis at left base. Minimal biapical scarring. No pneumothorax. Bones demineralized.  IMPRESSION: Upper normal heart size with slight pulmonary vascular congestion. Changes of COPD with left basilar atelectasis and increased atelectasis and pleural effusion at right base.   Original Report Authenticated By: Ulyses Southward, M.D.    Dg Elbow 2 Views Right  06/06/2012  *RADIOLOGY REPORT*  Clinical Data: Altered mental status, left elbow bruising question fall  RIGHT ELBOW - 2 VIEW  Comparison: None  Findings: Osseous demineralization. Joint spaces preserved. No acute fracture, dislocation or bone destruction. Regional soft tissue swelling greatest dorsal to the proximal forearm and elbow. No elbow joint effusion.  IMPRESSION: No acute osseous abnormalities.   Original Report Authenticated By: Ulyses Southward, M.D.    Ct Head Wo Contrast  06/06/2012  *RADIOLOGY REPORT*  Clinical Data: Altered mental status, confusion, fall, history stroke and pulmonary hypertension  CT HEAD WITHOUT CONTRAST  Technique:  Contiguous axial images were obtained from the base of the  skull through the vertex without contrast.  Comparison: 05/28/2012  Findings: Generalized atrophy. Normal ventricular morphology. No midline shift or mass effect. Otherwise normal appearance of brain parenchyma. No intracranial hemorrhage, mass lesion or evidence of acute infarction. No definite extra-axial fluid collections. Right phthisis bulbi. Extensive atherosclerotic calcification of internal carotid and vertebral arteries at skull base. Osseous demineralization. Visualized paranasal sinuses and mastoid air cells clear.  IMPRESSION: Age-related generalized atrophy. No definite acute intracranial abnormalities.   Original Report Authenticated By: Ulyses Southward, M.D.     EKG: Independently reviewed. As above   Active Problems:  * No active hospital problems. *    Assessment/Plan 1. Acute encephalopathy, delirium: Classic features of delirium with hallucinations and inappropriate speech. Urinalysis equivocal at nursing facility. By history has had encephalopathy before with UTI. I  suspect this is the causative agent. Treat empirically, followup culture results from nursing facility, repeat culture here. Recent blood cultures and pleural fluid culture on revealing on prior admission. No history or exam findings to suggest CNS infection or acute CNS insult, however cannot totally exclude small stroke. Check MRI. No medications administered likely to cause this. Hold narcotics, tramadol, Robaxin. 2. Suspect a UTI: Plan as above 3. Suspected chronic kidney disease stage III-4: BUN, creatinine, bicarbonate without significant change since discharge. Doubt significant acute renal dysfunction. Given her current hospitalization for pleural effusion and diastolic dysfunction we will monitor at this point. 4. Chronic respiratory failure with hypoxia: Appears stable. Continue oxygen. 5. Chronic diastolic heart failure: Stable. 6. Mild to moderate aortic stenosis and mild to moderate mitral stenosis with  moderate to severe mitral regurgitation: not candidate for intervention per cardiology 7. Pulmonary hypertension, moderate  8. Atrial fibrillation: Continue amiodarone. Per cardiology warfarin to keep INR between 2 and 2.5. Also amiodarone 200 BID for 2 weeks then go to once a day.  9. Recurrent right pleural effusion: Appears stable from last discharge. 10. Hypothyroidism: Stable 11. History of stroke: Stable. Continue Aggrenox, Zocor.    Code Status: CPR, no intubation Family Communication: Discussed with daughter at bedside Disposition Plan/Anticipated LOS: Inpatient, 2-3 days  Time spent: 46 minutes  [name], MD  [facility] Pager [phone] 06/06/2012, 10:42 AM

## 2012-06-06 NOTE — ED Notes (Signed)
Patient transported to X-ray 

## 2012-06-06 NOTE — ED Notes (Addendum)
Pt appears anxious & agitated. Is awake, alert but obviously confused. Pt only oriented to person, is unaware as to where & why she is here. Able to follow simple commands. C/o pain to right elbow & LLE where drsgs were placed to skin tears. Family reports pt has been more confused than normal since Tuesday & in the past a UTI was the cause.

## 2012-06-06 NOTE — ED Notes (Signed)
Did in and out cath on patient yellow urine in return 

## 2012-06-07 DIAGNOSIS — N179 Acute kidney failure, unspecified: Secondary | ICD-10-CM

## 2012-06-07 LAB — CBC
HCT: 32.1 % — ABNORMAL LOW (ref 36.0–46.0)
Hemoglobin: 10.4 g/dL — ABNORMAL LOW (ref 12.0–15.0)
MCH: 30.7 pg (ref 26.0–34.0)
MCV: 94.7 fL (ref 78.0–100.0)
RBC: 3.39 MIL/uL — ABNORMAL LOW (ref 3.87–5.11)

## 2012-06-07 LAB — URINE CULTURE

## 2012-06-07 LAB — BASIC METABOLIC PANEL WITH GFR
BUN: 39 mg/dL — ABNORMAL HIGH (ref 6–23)
CO2: 34 meq/L — ABNORMAL HIGH (ref 19–32)
Calcium: 9.8 mg/dL (ref 8.4–10.5)
Chloride: 96 meq/L (ref 96–112)
Creatinine, Ser: 1.5 mg/dL — ABNORMAL HIGH (ref 0.50–1.10)
GFR calc Af Amer: 35 mL/min — ABNORMAL LOW
GFR calc non Af Amer: 31 mL/min — ABNORMAL LOW
Glucose, Bld: 140 mg/dL — ABNORMAL HIGH (ref 70–99)
Potassium: 3.5 meq/L (ref 3.5–5.1)
Sodium: 143 meq/L (ref 135–145)

## 2012-06-07 LAB — PROTIME-INR
INR: 1.33 (ref 0.00–1.49)
Prothrombin Time: 16.2 s — ABNORMAL HIGH (ref 11.6–15.2)

## 2012-06-07 LAB — GLUCOSE, CAPILLARY: Glucose-Capillary: 118 mg/dL — ABNORMAL HIGH (ref 70–99)

## 2012-06-07 MED ORDER — WARFARIN SODIUM 5 MG PO TABS
5.0000 mg | ORAL_TABLET | Freq: Once | ORAL | Status: AC
  Administered 2012-06-07: 5 mg via ORAL
  Filled 2012-06-07: qty 1

## 2012-06-07 NOTE — Progress Notes (Signed)
ANTICOAGULATION CONSULT NOTE - Follow-up Consult  Pharmacy Consult for Coumadin Indication: atrial fibrillation  Allergies  Allergen Reactions  . Lisinopril Other (See Comments)    pancreatitis   Patient Measurements: Height: 5\' 4"  (162.6 cm) Weight: 167 lb (75.751 kg) IBW/kg (Calculated) : 54.7   Vital Signs: Temp: 98.3 F (36.8 C) (01/24 0444) Temp src: Oral (01/24 0444) BP: 137/59 mmHg (01/24 0444) Pulse Rate: 102  (01/24 0444)  Labs:  Basename 06/07/12 0700 06/06/12 2006 06/06/12 0900  HGB 10.4* -- 11.3*  HCT 32.1* -- 33.5*  PLT 362 -- 401*  APTT -- -- --  LABPROT 16.2* 15.3* --  INR 1.33 1.23 --  HEPARINUNFRC -- -- --  CREATININE 1.50* -- 1.62*  CKTOTAL -- -- --  CKMB -- -- --  TROPONINI -- -- --    Estimated Creatinine Clearance: 27.3 ml/min (by C-G formula based on Cr of 1.5).  Medical History: Past Medical History  Diagnosis Date  . Stroke   . Anemia   . Hyperlipidemia   . Vitamin D deficiency   . Hypothyroid   . Pulmonary hypertension   . Chronic respiratory failure with hypoxia   . Blindness of right eye     Macular degeneration  . Macular degeneration   . Hypertensive heart disease 04/16/2012  . CHF (congestive heart failure)   . Hypertension   . UTI (urinary tract infection)   . Arthritis     KNEES   Medications:  Prescriptions prior to admission  Medication Sig Dispense Refill  . amiodarone (PACERONE) 200 MG tablet Take 200 mg by mouth 2 (two) times daily.      . beta carotene w/minerals (OCUVITE) tablet Take 1 tablet by mouth daily.      . calcium carbonate (OS-CAL - DOSED IN MG OF ELEMENTAL CALCIUM) 1250 MG tablet Take 1 tablet by mouth daily.      . cefTRIAXone (ROCEPHIN) 1 G injection Inject 1 g into the muscle every 3 (three) days. With lidocaine 1%      . cholecalciferol (VITAMIN D) 1000 UNITS tablet Take 1,000 Units by mouth daily.      Marland Kitchen diltiazem (CARDIZEM CD) 180 MG 24 hr capsule Take 180 mg by mouth daily.      Tery Sanfilippo  Sodium (DSS) 100 MG CAPS Take 100 mg by mouth 2 (two) times daily.      . Ferrous Sulfate (IRON) 325 (65 FE) MG TABS Take 1 tablet by mouth daily.      . folic acid (FOLVITE) 1 MG tablet Take 1 mg by mouth daily.      . furosemide (LASIX) 40 MG tablet Take 40 mg by mouth at bedtime.       . furosemide (LASIX) 80 MG tablet Take 80 mg by mouth every morning.      . gabapentin (NEURONTIN) 100 MG capsule Take 100 mg by mouth 3 (three) times daily as needed. For pain      . glucosamine-chondroitin 500-400 MG tablet Take 1 tablet by mouth 3 (three) times daily.      Marland Kitchen HYDROcodone-acetaminophen (NORCO/VICODIN) 5-325 MG per tablet Take 1-2 tablets by mouth every 4 (four) hours as needed. For moderate pain      . levothyroxine (SYNTHROID, LEVOTHROID) 100 MCG tablet Take 100 mcg by mouth daily.      Marland Kitchen lidocaine (LIDODERM) 5 % Place 1 patch onto the skin daily. Remove & Discard patch within 12 hours or as directed by MD      .  methocarbamol (ROBAXIN) 500 MG tablet Take 500 mg by mouth 3 (three) times daily as needed. Muscle spasms      . Multiple Vitamin (MULITIVITAMIN WITH MINERALS) TABS Take 1 tablet by mouth daily.      Marland Kitchen omeprazole (PRILOSEC) 40 MG capsule Take 40 mg by mouth daily.      . simvastatin (ZOCOR) 10 MG tablet Take 10 mg by mouth at bedtime.      . traMADol (ULTRAM) 50 MG tablet Take 50 mg by mouth 3 (three) times daily. For pain relief      . warfarin (COUMADIN) 5 MG tablet Take 5 mg by mouth daily.        Assessment: 79 y/o female with two hospitalizations in the last 3 weeks brought to the ED for confusion. Pharmacy consulted to manage Coumadin for Afib. Amiodarone and Coumadin were started on discharge 1/20 with last dose of Coumadin on 1/22, interaction noted. Patient was given Coumadin 5 mg daily. INR is subtherapeutic today. No bleeding noted, H/H are low but improved from last admit, platelets are normal.  MD - please note, patient was receiving ceftriaxone IM injections at SNF.  IM  injections are not ideal in a patient on therapeutic anticoagulation.   Goal of Therapy:  INR 2-2.5 per MD notes   Plan:  -Coumadin 5 mg po tonight -INR daily  Tad Moore, BCPS  Clinical Pharmacist Pager [phone]  06/07/2012 9:28 AM

## 2012-06-07 NOTE — Progress Notes (Signed)
06/07/2012 11:15 AM  Pt granddaughter called RN to room to check on patient.  Pt was sitting in recliner as she was previously, however, a new generalized jerking movement (did not appear seizure like in nature) was present and patient was more lethargic and not as responsive as she had been.  After a few minutes, patient became more responsive and responded to sternal rub first, then was responding to conversation after a few more moments.  Generalized jerking movements persist, however.  Granddaughter states that she has had the movements before, especially over the past few weeks.  Pt vital signs are stable, CBG 118.  Pt placed on telemetry per Dr. Rene Kocher order.  Will continue to monitor patient. Eunice Blase

## 2012-06-07 NOTE — Clinical Social Work Note (Signed)
Patient is new admission from [facility], where she is receiving short-term rehab.CSW attempted to reach patient's daughter [name], [phone] and message left and patient's room checked and no one in room.   CSW spoke with SW at [facility] to confirm patient's status. CSW was advised by MD that patient should be ready on Monday and SW at [facility] advised of same. Per Jill Side, they should have a bed on Monday, and asked that CSW contact her on the 27th.  Genelle Bal, MSW, LCSW [phone]

## 2012-06-07 NOTE — Progress Notes (Signed)
Utilization review completed.  

## 2012-06-07 NOTE — Progress Notes (Signed)
[facility] PROGRESS NOTE  Adoria Kawamoto [place] WUJ:[id number] DOB: 1924/12/11 DOA: 06/06/2012 PCP: [name], MD Pulmonologist: [name], M.D.  Cardiologist: [name], M.D.  Assessment/Plan: 1. Acute encephalopathy, delirium: Perhaps some improvement.  Urinalysis equivocal at nursing facility, culture pending. By history has had encephalopathy before with UTI. I suspect this is the causative agent. Recent blood cultures and pleural fluid culture on revealing on prior admission. No history or exam findings to suggest CNS infection or acute CNS insult, and MRI brain negative. No medications administered likely to cause this. Hold narcotics, tramadol, Robaxin. TSH within normal limits. 2. Suspected UTI: Plan as above 3. Acute renal failure/Suspected chronic kidney disease stage III-4: Acute component appears stable. BUN, creatinine, bicarbonate without significant change. On previous hospitalizations for heart failure she was diuresed with the goal to keep on a "dry side".  4. Chronic respiratory failure with hypoxia: Appears stable. Continue oxygen. 5. Chronic diastolic heart failure: Stable. 6. Mild to moderate aortic stenosis and mild to moderate mitral stenosis with moderate to severe mitral regurgitation: not candidate for intervention per cardiology 7. Pulmonary hypertension, moderate  8. Atrial fibrillation: Continue amiodarone. Per cardiology warfarin to keep INR between 2 and 2.5. Also amiodarone 200 BID for 2 weeks then go to once a day.  9. Recurrent right pleural effusion: Appears stable from last discharge. 10. Hypothyroidism: Stable 11. History of stroke: Stable. Continue Aggrenox, Zocor.   Code Status: CPR, no intubation  Family Communication: Discussed with granddaughter at bedside  Disposition Plan/Anticipated LOS: Inpatient, 2-3 days   [name], MD  [facility] Team 5 Pager [phone] If 7PM-7AM, please contact night-coverage at [email or web address], password  [facility] 06/07/2012, 9:50 AM  LOS: 1 day   Brief narrative: 79 year old woman with 2 hospitalizations in the last 3 weeks presented from skilled nursing facility with 48 hours of confusion, hallucinations. Initial workup in the emergency department was unrevealing the patient was referred for further evaluation.  Consultants:  None  Procedures:    Antibiotics:  Ceftriaxone 1/23 >>  HPI/Subjective: Afebrile, stable vital signs. Granddaughter at bedside. She reports the patient recognizes her and overall she feels there has been some improvement. Patient denies complaints. Appears to be still delirious.  Objective: Filed Vitals:   06/06/12 1800 06/06/12 2111 06/07/12 0444 06/07/12 0941  BP: 161/91 157/74 137/59 135/70  Pulse: 102 103 102 103  Temp: 98 F (36.7 C) 97.2 F (36.2 C) 98.3 F (36.8 C) 98.6 F (37 C)  TempSrc: Oral Oral Oral   Resp: 18 18 18 16   Height:      Weight:      SpO2: 96% 96% 97% 94%    Intake/Output Summary (Last 24 hours) at 06/07/12 0950 Last data filed at 06/07/12 0943  Gross per 24 hour  Intake    520 ml  Output      0 ml  Net    520 ml   Filed Weights   06/06/12 1639  Weight: 75.751 kg (167 lb)    Exam:  General:  Appears calm and comfortable sitting in chair watching TV. Eyes: No change in a eye exam from yesterday. ENT: Hard of hearing. Cardiovascular: RRR, no m/r/g. No LE edema. Respiratory: CTA bilaterally, no w/r/r. Normal respiratory effort. Musculoskeletal: grossly normal tone and strength BUE/BLE Psychiatric: Alert, recognizes granddaughter, oriented to month and year with brisk responses. Disoriented to location. Neurologic: grossly non-focal.  Data Reviewed: Basic Metabolic Panel:  Lab 06/07/12 8657 06/06/12 0900 06/03/12 0516 06/02/12 0605  NA 143 141 137  138  K 3.5 3.8 3.8 3.9  CL 96 92* 93* 93*  CO2 34* 35* 40* 38*  GLUCOSE 140* 158* 153* 156*  BUN 39* 45* 48* 56*  CREATININE 1.50* 1.62* 1.56* 1.57*  CALCIUM 9.8  9.9 9.6 9.5  MG -- -- -- --  PHOS -- -- -- --   Liver Function Tests:  Lab 06/06/12 0900  AST 28  ALT 15  ALKPHOS 97  BILITOT 0.3  PROT 8.1  ALBUMIN 3.6   CBC:  Lab 06/07/12 0700 06/06/12 0900  WBC 8.0 9.2  NEUTROABS -- --  HGB 10.4* 11.3*  HCT 32.1* 33.5*  MCV 94.7 93.8  PLT 362 401*    Recent Results (from the past 240 hour(s))  CULTURE, BLOOD (ROUTINE X 2)     Status: Normal   Collection Time   05/28/12 12:55 PM      Component Value Range Status Comment   Specimen Description BLOOD RIGHT ANTECUBITAL   Final    Special Requests BOTTLES DRAWN AEROBIC AND ANAEROBIC 10CC   Final    Culture  Setup Time 05/28/2012 19:22   Final    Culture NO GROWTH 5 DAYS   Final    Report Status 06/03/2012 FINAL   Final   CULTURE, BLOOD (ROUTINE X 2)     Status: Normal   Collection Time   05/28/12  1:05 PM      Component Value Range Status Comment   Specimen Description BLOOD HAND RIGHT   Final    Special Requests BOTTLES DRAWN AEROBIC ONLY 10CC   Final    Culture  Setup Time 05/28/2012 19:23   Final    Culture NO GROWTH 5 DAYS   Final    Report Status 06/03/2012 FINAL   Final   BODY FLUID CULTURE     Status: Normal   Collection Time   05/31/12 10:56 AM      Component Value Range Status Comment   Specimen Description FLUID RIGHT PLEURAL   Final    Special Requests Normal   Final    Gram Stain     Final    Value: RARE WBC PRESENT, PREDOMINANTLY MONONUCLEAR     NO ORGANISMS SEEN   Culture NO GROWTH 3 DAYS   Final    Report Status 06/03/2012 FINAL   Final      Studies: Dg Chest 2 View  06/06/2012  *RADIOLOGY REPORT*  Clinical Data: Altered mental status question fall, bruising right elbow  CHEST - 2 VIEW  Comparison: 05/31/2012  Findings: Lower lateral left costal margin excluded. Upper normal heart size. Atherosclerotic calcification aorta. Slight pulmonary vascular congestion. Right basilar effusion and atelectasis. Underlying emphysematous and bronchitic changes with atelectasis  at left base. Minimal biapical scarring. No pneumothorax. Bones demineralized.  IMPRESSION: Upper normal heart size with slight pulmonary vascular congestion. Changes of COPD with left basilar atelectasis and increased atelectasis and pleural effusion at right base.   Original Report Authenticated By: Ulyses Southward, M.D.    Dg Elbow 2 Views Right  06/06/2012  *RADIOLOGY REPORT*  Clinical Data: Altered mental status, left elbow bruising question fall  RIGHT ELBOW - 2 VIEW  Comparison: None  Findings: Osseous demineralization. Joint spaces preserved. No acute fracture, dislocation or bone destruction. Regional soft tissue swelling greatest dorsal to the proximal forearm and elbow. No elbow joint effusion.  IMPRESSION: No acute osseous abnormalities.   Original Report Authenticated By: Ulyses Southward, M.D.    Ct Head Wo Contrast  06/06/2012  *RADIOLOGY REPORT*  Clinical Data: Altered mental status, confusion, fall, history stroke and pulmonary hypertension  CT HEAD WITHOUT CONTRAST  Technique:  Contiguous axial images were obtained from the base of the skull through the vertex without contrast.  Comparison: 05/28/2012  Findings: Generalized atrophy. Normal ventricular morphology. No midline shift or mass effect. Otherwise normal appearance of brain parenchyma. No intracranial hemorrhage, mass lesion or evidence of acute infarction. No definite extra-axial fluid collections. Right phthisis bulbi. Extensive atherosclerotic calcification of internal carotid and vertebral arteries at skull base. Osseous demineralization. Visualized paranasal sinuses and mastoid air cells clear.  IMPRESSION: Age-related generalized atrophy. No definite acute intracranial abnormalities.   Original Report Authenticated By: Ulyses Southward, M.D.    Mr Brain Wo Contrast  06/06/2012  *RADIOLOGY REPORT*  Clinical Data: Confusion and hallucinations.  Delirium.  MRI HEAD WITHOUT CONTRAST  Technique:  Multiplanar, multiecho pulse sequences of the brain  and surrounding structures were obtained according to standard protocol without intravenous contrast.  Comparison: CT 06/06/2012  Findings: Normal brain volume for age .  Small white matter hyperintensities bilaterally compatible with mild chronic microvascular ischemia.  Brainstem and cerebellum are normal.  No acute infarct.  Negative for intracranial hemorrhage.  No subdural fluid collection.  No mass or edema is present.  Ventricle size is normal.  Paranasal sinuses are clear. Mild mucosal thickening right mastoid sinus.  Chronic atrophy of the right eye.  IMPRESSION: No acute abnormality.  Age appropriate atrophy and chronic microvascular ischemic change.   Original Report Authenticated By: Janeece Riggers, M.D.     Scheduled Meds:   . amiodarone  200 mg Oral BID  . cefTRIAXone (ROCEPHIN)  IV  1 g Intravenous Q24H  . diltiazem  180 mg Oral Daily  . furosemide  80 mg Oral q morning - 10a  . levothyroxine  100 mcg Oral Q breakfast  . pantoprazole  40 mg Oral Daily  . simvastatin  10 mg Oral QHS  . sodium chloride  3 mL Intravenous Q12H  . warfarin  5 mg Oral ONCE-1800  . Warfarin - Pharmacist Dosing Inpatient   Does not apply q1800   Continuous Infusions:   Principal Problem:  *Acute encephalopathy Active Problems:  Hypothyroidism  Chronic respiratory failure with hypoxia  CKD (chronic kidney disease) stage 3, GFR 30-59 ml/min  UTI (lower urinary tract infection)  Atrial fibrillation     [name], MD  [facility] Team 5 Pager [phone] If 7PM-7AM, please contact night-coverage at [email or web address], password [facility] 06/07/2012, 9:50 AM  LOS: 1 day   Time spent: 20 minutes

## 2012-06-08 LAB — PROTIME-INR: Prothrombin Time: 18.3 seconds — ABNORMAL HIGH (ref 11.6–15.2)

## 2012-06-08 MED ORDER — WARFARIN SODIUM 4 MG PO TABS
4.0000 mg | ORAL_TABLET | Freq: Once | ORAL | Status: AC
  Administered 2012-06-08: 4 mg via ORAL
  Filled 2012-06-08: qty 1

## 2012-06-08 NOTE — Progress Notes (Signed)
[facility] PROGRESS NOTE  [name] ZOX:[id number] DOB: [birth year] DOA: 06/06/2012 PCP: [name], MD Pulmonologist: [name], M.D.  Cardiologist: [name], M.D.  Assessment/Plan: 1. Acute encephalopathy, delirium: Nearly resolved. Urine culture negative here, however patient was on Rocephin prior to admission. Urinalysis equivocal at nursing facility, culture pending. Recent blood cultures and pleural fluid culture on revealing on prior admission. No history or exam findings to suggest CNS infection or acute CNS insult, and MRI brain negative. TSH within normal limits. 2. Suspected UTI: Plan as above 3. Acute renal failure/Suspected chronic kidney disease stage III-4: Acute component appears stable. BUN, creatinine, bicarbonate without significant change. On previous hospitalizations for heart failure she was diuresed with the goal to keep on a "dry side".  4. Chronic respiratory failure with hypoxia: Appears stable. Continue oxygen. 5. Chronic diastolic heart failure: Stable. 6. Mild to moderate aortic stenosis and mild to moderate mitral stenosis with moderate to severe mitral regurgitation: not candidate for intervention per cardiology 7. Pulmonary hypertension, moderate  8. Atrial fibrillation: Continue amiodarone. Per cardiology warfarin to keep INR between 2 and 2.5. Also amiodarone 200 BID for 2 weeks then go to once a day.  9. Recurrent right pleural effusion: Appears stable from last discharge. 10. Hypothyroidism: Stable 11. History of stroke: Stable. Continue Aggrenox, Zocor.   Clearly improving now, presumed infection. Amiodarone can cause hallucination and tremors, however patient has improved on this medication. Therefore no changes at this time.  Code Status: CPR, no intubation  Family Communication:  Disposition Plan: Anticipate discharge 4/27.   [name], MD  [facility] Team 5 Pager [phone] If 7PM-7AM, please contact night-coverage at  [email or web address], password [facility] 06/08/2012, 4:42 PM  LOS: 2 days   Brief narrative: 79 year old woman with 2 hospitalizations in the last 3 weeks presented from skilled nursing facility with 48 hours of confusion, hallucinations. Initial workup in the emergency department was unrevealing the patient was referred for further evaluation.  Consultants:  None  Procedures:    Antibiotics:  Ceftriaxone 1/23 >>  HPI/Subjective: Afebrile, stable vital signs. Feels better. "I have been confused, I do not remember the last couple of days". Has some right elbow pain. Had an episode of tremors yesterday. No seizure activity. Discussed with daughter at bedside yesterday patient has had some intermittent tremors over the last 4-6 weeks.  Objective: Filed Vitals:   06/07/12 2222 06/08/12 0626 06/08/12 0951 06/08/12 1515  BP:  101/33 100/46 119/54  Pulse:  77 76 67  Temp:  98 F (36.7 C) 97.1 F (36.2 C) 97.5 F (36.4 C)  TempSrc:  Oral Oral Oral  Resp:  18 18 18   Height:      Weight:      SpO2: 99% 100% 98% 98%    Intake/Output Summary (Last 24 hours) at 06/08/12 1642 Last data filed at 06/08/12 1343  Gross per 24 hour  Intake    640 ml  Output    101 ml  Net    539 ml   Filed Weights   06/06/12 1639  Weight: 75.751 kg (167 lb)    Exam:  General:  Appears calm and comfortable sitting in chair. Cardiovascular: RRR, no m/r/g. No LE edema. Respiratory: CTA bilaterally, no w/r/r. Normal respiratory effort. Musculoskeletal: grossly normal tone and strength BUE/BLE Psychiatric: Alert, oriented to [facility], month, year, previous hospitalizations. Does not appear confused, focuses well. Neurologic: grossly non-focal.  Data Reviewed: Basic Metabolic Panel:  Lab 06/07/12 8295 06/06/12 0900 06/03/12 0516 06/02/12 6213  NA 143 141 137 138  K 3.5 3.8 3.8 3.9  CL 96 92* 93* 93*  CO2 34* 35* 40* 38*  GLUCOSE 140* 158* 153* 156*  BUN 39* 45* 48* 56*  CREATININE 1.50* 1.62*  1.56* 1.57*  CALCIUM 9.8 9.9 9.6 9.5  MG -- -- -- --  PHOS -- -- -- --   Liver Function Tests:  Lab 06/06/12 0900  AST 28  ALT 15  ALKPHOS 97  BILITOT 0.3  PROT 8.1  ALBUMIN 3.6   CBC:  Lab 06/07/12 0700 06/06/12 0900  WBC 8.0 9.2  NEUTROABS -- --  HGB 10.4* 11.3*  HCT 32.1* 33.5*  MCV 94.7 93.8  PLT 362 401*    Recent Results (from the past 240 hour(s))  BODY FLUID CULTURE     Status: Normal   Collection Time   05/31/12 10:56 AM      Component Value Range Status Comment   Specimen Description FLUID RIGHT PLEURAL   Final    Special Requests Normal   Final    Gram Stain     Final    Value: RARE WBC PRESENT, PREDOMINANTLY MONONUCLEAR     NO ORGANISMS SEEN   Culture NO GROWTH 3 DAYS   Final    Report Status 06/03/2012 FINAL   Final   URINE CULTURE     Status: Normal   Collection Time   06/06/12  9:13 AM      Component Value Range Status Comment   Specimen Description URINE, CATHETERIZED   Final    Special Requests ADDED [id number]   Final    Culture  Setup Time 06/06/2012 18:34   Final    Colony Count NO GROWTH   Final    Culture NO GROWTH   Final    Report Status 06/07/2012 FINAL   Final      Studies: Mr Brain Wo Contrast  06/06/2012  *RADIOLOGY REPORT*  Clinical Data: Confusion and hallucinations.  Delirium.  MRI HEAD WITHOUT CONTRAST  Technique:  Multiplanar, multiecho pulse sequences of the brain and surrounding structures were obtained according to standard protocol without intravenous contrast.  Comparison: CT 06/06/2012  Findings: Normal brain volume for age .  Small white matter hyperintensities bilaterally compatible with mild chronic microvascular ischemia.  Brainstem and cerebellum are normal.  No acute infarct.  Negative for intracranial hemorrhage.  No subdural fluid collection.  No mass or edema is present.  Ventricle size is normal.  Paranasal sinuses are clear. Mild mucosal thickening right mastoid sinus.  Chronic atrophy of the right eye.  IMPRESSION:  No acute abnormality.  Age appropriate atrophy and chronic microvascular ischemic change.   Original Report Authenticated By: Janeece Riggers, M.D.     Scheduled Meds:    . amiodarone  200 mg Oral BID  . cefTRIAXone (ROCEPHIN)  IV  1 g Intravenous Q24H  . diltiazem  180 mg Oral Daily  . furosemide  80 mg Oral q morning - 10a  . levothyroxine  100 mcg Oral Q breakfast  . pantoprazole  40 mg Oral Daily  . simvastatin  10 mg Oral QHS  . sodium chloride  3 mL Intravenous Q12H  . warfarin  4 mg Oral ONCE-1800  . Warfarin - Pharmacist Dosing Inpatient   Does not apply q1800   Continuous Infusions:   Principal Problem:  *Acute encephalopathy Active Problems:  Hypothyroidism  Chronic respiratory failure with hypoxia  CKD (chronic kidney disease) stage 3, GFR 30-59 ml/min  UTI (lower urinary tract  infection)  Atrial fibrillation     [name], MD  [facility] Team 5 Pager [phone] If 7PM-7AM, please contact night-coverage at [email or web address], password [facility] 06/08/2012, 4:42 PM  LOS: 2 days   Time spent: 20 minutes

## 2012-06-08 NOTE — Evaluation (Signed)
Physical Therapy Evaluation Patient Details Name: [name] MRN: [id number] DOB: [birth year] Today's Date: 06/08/2012 Time: 4098-1191 PT Time Calculation (min): 25 min  PT Assessment / Plan / Recommendation Clinical Impression  79 yo adm with hypoxia and confusion (? UTI vs pna). Pt continues to drop her sats with activity. Remains confused, however able to follow commands. Will benefit from PT to incr safety with mobility prior to d/c    PT Assessment  Patient needs continued PT services    Follow Up Recommendations  SNF;Supervision/Assistance - 24 hour                Equipment Recommendations  None recommended by PT         Frequency Min 3X/week    Precautions / Restrictions Precautions Precautions: Fall Precaution Comments: monitor sats on O2  Restrictions Weight Bearing Restrictions: No   Pertinent Vitals/Pain Reported back pain on 1st attempt at 08:53--RN made aware and provided pain medicine; no reports of pain during session      Mobility  Bed Mobility Bed Mobility: Rolling Right;Right Sidelying to Sit;Sitting - Scoot to [place] of Bed Rolling Right: 4: Min assist;With rail Right Sidelying to Sit: 4: Min assist;With rails;HOB elevated Sitting - Scoot to [place] of Bed: 4: Min guard Details for Bed Mobility Assistance: instructional, step-by-step cues for mobility due to inability to problem-solve how to complete tasks Transfers Transfers: Sit to Stand;Stand to Sit Sit to Stand: 4: Min guard;With upper extremity assist;From bed;From toilet Stand to Sit: 4: Min guard;With upper extremity assist;With armrests;To chair/3-in-1;To toilet Details for Transfer Assistance: assist for safety Ambulation/Gait Ambulation/Gait Assistance: 4: Min assist Ambulation Distance (Feet): 30 Feet (15 x 2) Assistive device: Rolling walker Ambulation/Gait Assistance Details: assist to maneuver RW into/out of bathroom Gait Pattern: Step-through pattern;Decreased stride length;Trunk  flexed Gait velocity: decreased    Shoulder Instructions     Exercises General Exercises - Lower Extremity Ankle Circles/Pumps: AROM;Both;10 reps   PT Diagnosis: Difficulty walking;Generalized weakness  PT Problem List: Decreased strength;Decreased activity tolerance;Decreased balance;Decreased mobility;Decreased cognition;Decreased knowledge of use of DME;Decreased safety awareness;Decreased knowledge of precautions;Pain PT Treatment Interventions: DME instruction;Gait training;Functional mobility training;Therapeutic activities;Therapeutic exercise;Balance training;Cognitive remediation;Patient/family education   PT Goals Acute Rehab PT Goals PT Goal Formulation: Patient unable to participate in goal setting Time For Goal Achievement: 06/15/12 Potential to Achieve Goals: Good Pt will Roll Supine to Right Side: with supervision;with rail PT Goal: Rolling Supine to Right Side - Progress: Goal set today Pt will go Supine/Side to Sit: with supervision;with HOB 0 degrees;with rail PT Goal: Supine/Side to Sit - Progress: Goal set today Pt will go Sit to Supine/Side: with supervision;with HOB 0 degrees;with rail PT Goal: Sit to Supine/Side - Progress: Goal set today Pt will go Sit to Stand: with supervision;with upper extremity assist PT Goal: Sit to Stand - Progress: Goal set today Pt will go Stand to Sit: with supervision;with upper extremity assist PT Goal: Stand to Sit - Progress: Goal set today Pt will Ambulate: 51 - 150 feet;with supervision;with least restrictive assistive device PT Goal: Ambulate - Progress: Goal set today  Visit Information  Last PT Received On: 06/08/12 Assistance Needed: +1    Subjective Data  Subjective: "Where am I? Where did I come from?" Patient Stated Goal: get something to eat   Prior Functioning  Home Living Lives With: Spouse Available Help at Discharge: Skilled Nursing Facility (recently d/c'd from hospital to [facility]) Type of Home:  Skilled Nursing Facility Home Adaptive Equipment: Dan Humphreys -  rolling;Straight cane;Shower chair with back Prior Function Level of Independence: Needs assistance Needs Assistance: Bathing;Dressing;Toileting;Gait;Transfers Gait Assistance: 2 recent falls at SNF (circumstances not known) Comments: was at home with spouse prior to last hospitalization and independent with assistive device Communication Communication: HOH    Cognition  Overall Cognitive Status: Impaired Area of Impairment: Memory;Problem solving Arousal/Alertness: Lethargic (sleeping on arrival; slow to awaken) Orientation Level: Disoriented to;Place;Time;Situation Behavior During Session: [facility] for tasks performed Problem Solving: unable to problem solve how to get OOB, required vc    Extremity/Trunk Assessment Right Lower Extremity Assessment RLE ROM/Strength/Tone: Deficits RLE ROM/Strength/Tone Deficits: AROM WFL;grossly 4/5 Left Lower Extremity Assessment LLE ROM/Strength/Tone: Deficits LLE ROM/Strength/Tone Deficits: AROM WFL;grossly 4/5 Trunk Assessment Trunk Assessment: Normal   Balance Balance Balance Assessed: Yes Static Sitting Balance Static Sitting - Balance Support: No upper extremity supported;Feet unsupported Static Sitting - Level of Assistance: 5: Stand by assistance Static Standing Balance Static Standing - Balance Support: Bilateral upper extremity supported Static Standing - Level of Assistance: 4: Min assist  End of Session PT - End of Session Equipment Utilized During Treatment: Gait belt Activity Tolerance: Treatment limited secondary to medical complications (Comment) (decr SaO2) Patient left: in chair;with call bell/phone within reach;with chair alarm set Nurse Communication: Mobility status  GP     [name] 06/08/2012, 11:38 AM  Pager [phone]

## 2012-06-08 NOTE — Progress Notes (Signed)
ANTICOAGULATION CONSULT NOTE - Follow-up Consult  Pharmacy Consult for Coumadin Indication: atrial fibrillation  Allergies  Allergen Reactions  . Lisinopril Other (See Comments)    pancreatitis   Patient Measurements: Height: 5\' 4"  (162.6 cm) Weight: 167 lb (75.751 kg) IBW/kg (Calculated) : 54.7   Vital Signs: Temp: 97.1 F (36.2 C) (01/25 0951) Temp src: Oral (01/25 0951) BP: 100/46 mmHg (01/25 0951) Pulse Rate: 76  (01/25 0951)  Labs:  Basename 06/08/12 0605 06/07/12 0700 06/06/12 2006 06/06/12 0900  HGB -- 10.4* -- 11.3*  HCT -- 32.1* -- 33.5*  PLT -- 362 -- 401*  APTT -- -- -- --  LABPROT 18.3* 16.2* 15.3* --  INR 1.57* 1.33 1.23 --  HEPARINUNFRC -- -- -- --  CREATININE -- 1.50* -- 1.62*  CKTOTAL -- -- -- --  CKMB -- -- -- --  TROPONINI -- -- -- --    Estimated Creatinine Clearance: 27.3 ml/min (by C-G formula based on Cr of 1.5).  Medical History: Past Medical History  Diagnosis Date  . Stroke   . Anemia   . Hyperlipidemia   . Vitamin D deficiency   . Hypothyroid   . Pulmonary hypertension   . Chronic respiratory failure with hypoxia   . Blindness of right eye     Macular degeneration  . Macular degeneration   . Hypertensive heart disease 04/16/2012  . CHF (congestive heart failure)   . Hypertension   . UTI (urinary tract infection)   . Arthritis     KNEES   Medications:  Prescriptions prior to admission  Medication Sig Dispense Refill  . amiodarone (PACERONE) 200 MG tablet Take 200 mg by mouth 2 (two) times daily.      . beta carotene w/minerals (OCUVITE) tablet Take 1 tablet by mouth daily.      . calcium carbonate (OS-CAL - DOSED IN MG OF ELEMENTAL CALCIUM) 1250 MG tablet Take 1 tablet by mouth daily.      . cefTRIAXone (ROCEPHIN) 1 G injection Inject 1 g into the muscle every 3 (three) days. With lidocaine 1%      . cholecalciferol (VITAMIN D) 1000 UNITS tablet Take 1,000 Units by mouth daily.      Marland Kitchen diltiazem (CARDIZEM CD) 180 MG 24 hr  capsule Take 180 mg by mouth daily.      Tery Sanfilippo Sodium (DSS) 100 MG CAPS Take 100 mg by mouth 2 (two) times daily.      . Ferrous Sulfate (IRON) 325 (65 FE) MG TABS Take 1 tablet by mouth daily.      . folic acid (FOLVITE) 1 MG tablet Take 1 mg by mouth daily.      . furosemide (LASIX) 40 MG tablet Take 40 mg by mouth at bedtime.       . furosemide (LASIX) 80 MG tablet Take 80 mg by mouth every morning.      . gabapentin (NEURONTIN) 100 MG capsule Take 100 mg by mouth 3 (three) times daily as needed. For pain      . glucosamine-chondroitin 500-400 MG tablet Take 1 tablet by mouth 3 (three) times daily.      Marland Kitchen HYDROcodone-acetaminophen (NORCO/VICODIN) 5-325 MG per tablet Take 1-2 tablets by mouth every 4 (four) hours as needed. For moderate pain      . levothyroxine (SYNTHROID, LEVOTHROID) 100 MCG tablet Take 100 mcg by mouth daily.      Marland Kitchen lidocaine (LIDODERM) 5 % Place 1 patch onto the skin daily. Remove & Discard patch within  12 hours or as directed by MD      . methocarbamol (ROBAXIN) 500 MG tablet Take 500 mg by mouth 3 (three) times daily as needed. Muscle spasms      . Multiple Vitamin (MULITIVITAMIN WITH MINERALS) TABS Take 1 tablet by mouth daily.      Marland Kitchen omeprazole (PRILOSEC) 40 MG capsule Take 40 mg by mouth daily.      . simvastatin (ZOCOR) 10 MG tablet Take 10 mg by mouth at bedtime.      . traMADol (ULTRAM) 50 MG tablet Take 50 mg by mouth 3 (three) times daily. For pain relief      . warfarin (COUMADIN) 5 MG tablet Take 5 mg by mouth daily.        Assessment: 79 y/o female with two hospitalizations in the last 3 weeks brought to the ED for confusion. Pharmacy consulted to manage Coumadin for Afib. Amiodarone and Coumadin were started on discharge 1/20 with last dose of Coumadin on 1/22, interaction noted. Patient was given Coumadin 5 mg daily. INR is subtherapeutic today but increasing towards goal. Today: INR 1.57. No bleeding noted, H/H are low but improved from last admit,  platelets are normal.   Goal of Therapy:  INR 2-2.5 per MD notes   Plan:  -Coumadin 4 mg po tonight -INR daily    Doris Cheadle, PharmD Clinical Pharmacist Pager: [phone] Phone: [phone] 06/08/2012 10:41 AM

## 2012-06-09 LAB — PROTIME-INR: Prothrombin Time: 19.1 seconds — ABNORMAL HIGH (ref 11.6–15.2)

## 2012-06-09 MED ORDER — WARFARIN SODIUM 5 MG PO TABS
5.0000 mg | ORAL_TABLET | Freq: Once | ORAL | Status: AC
  Administered 2012-06-09: 5 mg via ORAL
  Filled 2012-06-09: qty 1

## 2012-06-09 NOTE — Progress Notes (Signed)
[facility] PROGRESS NOTE  [name] AVW:[id number] DOB: [birth year] DOA: 06/06/2012 PCP: [name], MD Pulmonologist: [name], M.D.  Cardiologist: [name], M.D.  Assessment/Plan: 1. Acute encephalopathy, delirium: Resolved. Urine culture negative here, however patient was on Rocephin prior to admission. Urinalysis equivocal at nursing facility, culture pending. Recent blood cultures and pleural fluid culture on revealing on prior admission. MRI brain negative. TSH within normal limits. 2. Suspected UTI: Plan as above 3. Acute renal failure/Suspected chronic kidney disease stage III-4: Acute component appears stable. BUN, creatinine, bicarbonate without significant change. On previous hospitalizations for heart failure she was diuresed with the goal to keep on a "dry side".  4. Chronic respiratory failure with hypoxia: Appears stable. Continue oxygen. 5. Chronic diastolic heart failure: Stable. 6. Mild to moderate aortic stenosis and mild to moderate mitral stenosis with moderate to severe mitral regurgitation: not candidate for intervention per cardiology 7. Pulmonary hypertension, moderate  8. Atrial fibrillation: Continue amiodarone. Per cardiology warfarin to keep INR between 2 and 2.5. Also amiodarone 200 BID for 2 weeks then go to once a day.  9. Recurrent right pleural effusion: Appears stable from last discharge. 10. Hypothyroidism: Stable 11. History of stroke: Stable. Continue Aggrenox, Zocor.     Code Status: CPR, no intubation  Family Communication: Discussed with daughter by telephone. She concurs with assessment and anticipate transfer 1/27 Disposition Plan: Anticipate discharge 1/27.   [name], MD  [facility] Team 5 Pager [phone] If 7PM-7AM, please contact night-coverage at [email or web address], password [facility] 06/09/2012, 1:36 PM  LOS: 3 days   Brief narrative: 79 year old woman with 2 hospitalizations in the last 3 weeks presented from  skilled nursing facility with 48 hours of confusion, hallucinations. Initial workup in the emergency department was unrevealing the patient was referred for further evaluation.  Consultants:  None  Procedures:    Antibiotics:  Ceftriaxone 1/23 >>  HPI/Subjective: Afebrile, stable vital signs. Feels much better. No problems.  Objective: Filed Vitals:   06/08/12 1717 06/08/12 2040 06/09/12 0452 06/09/12 0943  BP: 102/40 123/51 133/45 129/48  Pulse:  75 74 93  Temp: 97.4 F (36.3 C) 98 F (36.7 C) 98.1 F (36.7 C) 98.3 F (36.8 C)  TempSrc: Oral Oral Oral Axillary  Resp: 18 16 16 18   Height:      Weight:  70.5 kg (155 lb 6.8 oz)    SpO2: 99% 96% 100% 99%    Intake/Output Summary (Last 24 hours) at 06/09/12 1336 Last data filed at 06/09/12 0943  Gross per 24 hour  Intake    840 ml  Output    903 ml  Net    -63 ml   Filed Weights   06/06/12 1639 06/08/12 2040  Weight: 75.751 kg (167 lb) 70.5 kg (155 lb 6.8 oz)    Exam:  General:  Appears calm. Cardiovascular: RRR, no m/r/g. No LE edema. Telemetry: SR, no arrythmias Respiratory: CTA bilaterally, no w/r/r. Normal respiratory effort. Psychiatric: Alert, oriented to [facility], month, year, previous hospitalizations. Does not appear confused. Neurologic: grossly non-focal.  Exam current 1/26  Data Reviewed: Basic Metabolic Panel:  Lab 06/07/12 3086 06/06/12 0900 06/03/12 0516  NA 143 141 137  K 3.5 3.8 3.8  CL 96 92* 93*  CO2 34* 35* 40*  GLUCOSE 140* 158* 153*  BUN 39* 45* 48*  CREATININE 1.50* 1.62* 1.56*  CALCIUM 9.8 9.9 9.6  MG -- -- --  PHOS -- -- --   Liver Function Tests:  Lab 06/06/12 0900  AST 28  ALT 15  ALKPHOS 97  BILITOT 0.3  PROT 8.1  ALBUMIN 3.6   CBC:  Lab 06/07/12 0700 06/06/12 0900  WBC 8.0 9.2  NEUTROABS -- --  HGB 10.4* 11.3*  HCT 32.1* 33.5*  MCV 94.7 93.8  PLT 362 401*    Recent Results (from the past 240 hour(s))  BODY FLUID CULTURE     Status: Normal    Collection Time   05/31/12 10:56 AM      Component Value Range Status Comment   Specimen Description FLUID RIGHT PLEURAL   Final    Special Requests Normal   Final    Gram Stain     Final    Value: RARE WBC PRESENT, PREDOMINANTLY MONONUCLEAR     NO ORGANISMS SEEN   Culture NO GROWTH 3 DAYS   Final    Report Status 06/03/2012 FINAL   Final   URINE CULTURE     Status: Normal   Collection Time   06/06/12  9:13 AM      Component Value Range Status Comment   Specimen Description URINE, CATHETERIZED   Final    Special Requests ADDED [id number]   Final    Culture  Setup Time 06/06/2012 18:34   Final    Colony Count NO GROWTH   Final    Culture NO GROWTH   Final    Report Status 06/07/2012 FINAL   Final      Studies: No results found.  Scheduled Meds:    . amiodarone  200 mg Oral BID  . cefTRIAXone (ROCEPHIN)  IV  1 g Intravenous Q24H  . diltiazem  180 mg Oral Daily  . furosemide  80 mg Oral q morning - 10a  . levothyroxine  100 mcg Oral Q breakfast  . pantoprazole  40 mg Oral Daily  . simvastatin  10 mg Oral QHS  . sodium chloride  3 mL Intravenous Q12H  . warfarin  5 mg Oral ONCE-1800  . Warfarin - Pharmacist Dosing Inpatient   Does not apply q1800   Continuous Infusions:   Principal Problem:  *Acute encephalopathy Active Problems:  Hypothyroidism  Chronic respiratory failure with hypoxia  CKD (chronic kidney disease) stage 3, GFR 30-59 ml/min  UTI (lower urinary tract infection)  Atrial fibrillation     [name], MD  [facility] Team 5 Pager [phone] If 7PM-7AM, please contact night-coverage at [email or web address], password [facility] 06/09/2012, 1:36 PM  LOS: 3 days   Time spent: 15 minutes

## 2012-06-09 NOTE — Progress Notes (Signed)
ANTICOAGULATION CONSULT NOTE - Follow-up Consult  Pharmacy Consult for Coumadin Indication: atrial fibrillation  Allergies  Allergen Reactions  . Lisinopril Other (See Comments)    pancreatitis   Patient Measurements: Height: 5\' 4"  (162.6 cm) Weight: 155 lb 6.8 oz (70.5 kg) IBW/kg (Calculated) : 54.7   Vital Signs: Temp: 98.3 F (36.8 C) (01/26 0943) Temp src: Axillary (01/26 0943) BP: 129/48 mmHg (01/26 0943) Pulse Rate: 93  (01/26 0943)  Labs:  Alvira Philips 06/09/12 0865 06/08/12 0605 06/07/12 0700  HGB -- -- 10.4*  HCT -- -- 32.1*  PLT -- -- 362  APTT -- -- --  LABPROT 19.1* 18.3* 16.2*  INR 1.66* 1.57* 1.33  HEPARINUNFRC -- -- --  CREATININE -- -- 1.50*  CKTOTAL -- -- --  CKMB -- -- --  TROPONINI -- -- --    Estimated Creatinine Clearance: 26.4 ml/min (by C-G formula based on Cr of 1.5).  Medical History: Past Medical History  Diagnosis Date  . Stroke   . Anemia   . Hyperlipidemia   . Vitamin D deficiency   . Hypothyroid   . Pulmonary hypertension   . Chronic respiratory failure with hypoxia   . Blindness of right eye     Macular degeneration  . Macular degeneration   . Hypertensive heart disease 04/16/2012  . CHF (congestive heart failure)   . Hypertension   . UTI (urinary tract infection)   . Arthritis     KNEES   Medications:  Prescriptions prior to admission  Medication Sig Dispense Refill  . amiodarone (PACERONE) 200 MG tablet Take 200 mg by mouth 2 (two) times daily.      . beta carotene w/minerals (OCUVITE) tablet Take 1 tablet by mouth daily.      . calcium carbonate (OS-CAL - DOSED IN MG OF ELEMENTAL CALCIUM) 1250 MG tablet Take 1 tablet by mouth daily.      . cefTRIAXone (ROCEPHIN) 1 G injection Inject 1 g into the muscle every 3 (three) days. With lidocaine 1%      . cholecalciferol (VITAMIN D) 1000 UNITS tablet Take 1,000 Units by mouth daily.      Marland Kitchen diltiazem (CARDIZEM CD) 180 MG 24 hr capsule Take 180 mg by mouth daily.      Tery Sanfilippo  Sodium (DSS) 100 MG CAPS Take 100 mg by mouth 2 (two) times daily.      . Ferrous Sulfate (IRON) 325 (65 FE) MG TABS Take 1 tablet by mouth daily.      . folic acid (FOLVITE) 1 MG tablet Take 1 mg by mouth daily.      . furosemide (LASIX) 40 MG tablet Take 40 mg by mouth at bedtime.       . furosemide (LASIX) 80 MG tablet Take 80 mg by mouth every morning.      . gabapentin (NEURONTIN) 100 MG capsule Take 100 mg by mouth 3 (three) times daily as needed. For pain      . glucosamine-chondroitin 500-400 MG tablet Take 1 tablet by mouth 3 (three) times daily.      Marland Kitchen HYDROcodone-acetaminophen (NORCO/VICODIN) 5-325 MG per tablet Take 1-2 tablets by mouth every 4 (four) hours as needed. For moderate pain      . levothyroxine (SYNTHROID, LEVOTHROID) 100 MCG tablet Take 100 mcg by mouth daily.      Marland Kitchen lidocaine (LIDODERM) 5 % Place 1 patch onto the skin daily. Remove & Discard patch within 12 hours or as directed by MD      .  methocarbamol (ROBAXIN) 500 MG tablet Take 500 mg by mouth 3 (three) times daily as needed. Muscle spasms      . Multiple Vitamin (MULITIVITAMIN WITH MINERALS) TABS Take 1 tablet by mouth daily.      Marland Kitchen omeprazole (PRILOSEC) 40 MG capsule Take 40 mg by mouth daily.      . simvastatin (ZOCOR) 10 MG tablet Take 10 mg by mouth at bedtime.      . traMADol (ULTRAM) 50 MG tablet Take 50 mg by mouth 3 (three) times daily. For pain relief      . warfarin (COUMADIN) 5 MG tablet Take 5 mg by mouth daily.        Assessment: 79 y/o female with two hospitalizations in the last 3 weeks brought to the ED for confusion. Pharmacy consulted to manage Coumadin for Afib. Amiodarone and Coumadin were started on discharge 1/20 with last dose of Coumadin on 1/22, interaction noted. PTA was on 5 mg daily.  INR is subtherapeutic today but increasing towards goal. Today: INR 1.66. No bleeding noted, H/H are low but improved from last admit, platelets are normal.   Goal of Therapy:  INR 2-2.5 per MD notes    Plan:  -Coumadin 5 mg po tonight -INR daily    Doris Cheadle, PharmD Clinical Pharmacist Pager: [phone] Phone: [phone] 06/09/2012 11:31 AM

## 2012-06-10 MED ORDER — AMOXICILLIN-POT CLAVULANATE 500-125 MG PO TABS: 1.0000 | ORAL_TABLET | Freq: Two times a day (BID) | ORAL | Status: DC

## 2012-06-10 MED ORDER — AMOXICILLIN-POT CLAVULANATE 500-125 MG PO TABS
1.0000 | ORAL_TABLET | Freq: Two times a day (BID) | ORAL | Status: DC
  Administered 2012-06-10: 500 mg via ORAL
  Filled 2012-06-10 (×2): qty 1

## 2012-06-10 NOTE — Clinical Social Work Psychosocial (Addendum)
Clinical Social Work Department BRIEF PSYCHOSOCIAL ASSESSMENT 06/10/2012  Patient:  [name], [name]     Account Number:  [id number]     Admit date:  06/06/2012  Clinical Social Worker:  Delmer Islam  Date/Time:  06/10/2012 02:20 AM  Referred by:  RN  Date Referred:  06/07/2012 Referred for  SNF Placement   Other Referral:   Interview type:  Family Other interview type:    PSYCHOSOCIAL DATA Living Status:  FACILITY Admitted from facility:  [facility], [place] Level of care:  Skilled Nursing Facility Primary support name:  [name] Primary support relationship to patient:  CHILD, ADULT Degree of support available:   good support - daughter at the bedside    CURRENT CONCERNS Current Concerns  Post-Acute Placement   Other Concerns:    SOCIAL WORK ASSESSMENT / PLAN CSW talked with daughter who was at the bedside regarding today's discharge. She is aware of discharge and plans to transport patient back to [facility]. CSW made daughter aware that she would take patient's medical information with her.  Prior to talking with family, CSW contacted [facility] admissions staff, Twanna Hy, SW to advise of discharge and d/c summary forwarded to facility.   Assessment/plan status:   Other assessment/ plan:   Information/referral to community resources:    PATIENT'S/FAMILY'S RESPONSE TO PLAN OF CARE: Daughter expressed appreciation for CSW's assistance with discharge.

## 2012-06-10 NOTE — Discharge Summary (Signed)
Physician Discharge Summary  [name] ZOX:[id number] DOB: 07-14-1924 DOA: 06/06/2012  PCP: [name], MD Pulmonologist: [name], M.D.  Cardiologist: [name], M.D.  Admit date: 06/06/2012 Discharge date: 06/10/2012  Recommendations for Outpatient Follow-up:  1. Followup urine culture obtained at facility prior to admission 2. Continue physical and occupational therapy 3. Continued wound care to superficial skin tears 4. Atrial fibrillation: Per cardiology warfarin to keep INR between 2 and 2.5. Monitor INR daily while on antibiotics. Also amiodarone 200 BID for 2 weeks then go to once a day, approximately February 6.   Lab Results  Component Value Date   INR 1.58* 06/10/2012   INR 1.66* 06/09/2012   INR 1.57* 06/08/2012    Follow-up Information    Follow up with [facility], MD. In 2 weeks.   Contact information:   [address] [facility] [place] [address] [phone]       Follow up with TILLEY JR,W SPENCER, MD. In 2 weeks.   Contact information:   [address] [place] [address] [phone]         Discharge Diagnoses:  1. Acute encephalopathy, delirium 2. Suspected UTI 3. Superficial skin tears 4. Acute renal failure  5. Chronic respiratory failure, stable 6. Atrial fibrillation, stable  Discharge Condition:  improved Disposition:  return to skilled nursing facility  Diet recommendation:  heart healthy diet, thin liquids  Filed Weights   06/06/12 1639 06/08/12 2040 06/09/12 2122  Weight: 75.751 kg (167 lb) 70.5 kg (155 lb 6.8 oz) 70.3 kg (154 lb 15.7 oz)    History of present illness:  79 year old woman with 2 hospitalizations in the last 3 weeks presented from skilled nursing facility with 48 hours of confusion, hallucinations. Initial workup in the emergency department was unrevealing the patient was referred for further evaluation.  Hospital Course:   Ms. Gerilyn Pilgrim was admitted for further  evaluation and treatment of acute encephalopathy, delirium. Urinalysis obtained prior to admission was equivocal culture report from that facility was never received. However symptoms and history are most suggestive of UTI with delirium and patient was placed on empiric Rocephin with complete resolution of encephalopathy and delirium over the course of 48 hours. She will complete a course of antibiotics orally as an outpatient. Patient had been prescribed gabapentin as well as narcotics and tramadol however per the facility she had not received this. Her only new medications from previous discharge were warfarin and amiodarone. Her family had noticed brief tremors/transient muscle spasms over the last month or so. There is no evidence of seizure activity. Given resolution of acute encephalopathy on amiodarone no changes were made. Mild acute renal failure resolved. Other chronic issues remained stable during this hospitalization and are detailed below. She has not required any pain medication here other than Tylenol. 1. Acute encephalopathy, delirium: Resolved. Urine culture negative here, however patient was on Rocephin prior to admission. Urinalysis equivocal at nursing facility, culture pending. Recent blood cultures and pleural fluid culture on revealing on prior admission. MRI brain negative. TSH within normal limits.  2. Suspected UTI: Plan as above  3. Skin tears: Continue wound care (Steri-Strips, keep clean and dry).  4. Acute renal failure/Suspected chronic kidney disease stage III-4: Acute component appears stable. BUN, creatinine, bicarbonate without significant change. On previous hospitalizations for heart failure she was diuresed with the goal to keep on a "dry side".    5. Chronic respiratory failure with hypoxia: Appears stable. Continue oxygen.  6. Chronic diastolic  heart failure: Stable.  7. Mild to moderate aortic stenosis and mild to moderate mitral stenosis with moderate to severe mitral  regurgitation: not candidate for intervention per cardiology  8. Pulmonary hypertension, moderate    9. Atrial fibrillation: Continue amiodarone. Per cardiology warfarin to keep INR between 2 and 2.5. Also amiodarone 200 BID for 2 weeks then go to once a day, approximately February 6.    10. Recurrent right pleural effusion: Appears stable from last discharge.  11. Hypothyroidism: Stable  12. History of stroke: Stable. Continue Aggrenox, Zocor.   Consultants:  None  Procedures:  None  Antibiotics:  Ceftriaxone 1/23 >> 1/26   Augmentin 1/27 >> 1/29  Discharge Instructions     Medication List     As of 06/10/2012  9:54 AM    STOP taking these medications         gabapentin 100 MG capsule   Commonly known as: NEURONTIN      HYDROcodone-acetaminophen 5-325 MG per tablet   Commonly known as: NORCO/VICODIN      methocarbamol 500 MG tablet   Commonly known as: ROBAXIN      ROCEPHIN 1 G injection   Generic drug: cefTRIAXone      traMADol 50 MG tablet   Commonly known as: ULTRAM      TAKE these medications         amiodarone 200 MG tablet   Commonly known as: PACERONE   Take 200 mg by mouth 2 (two) times daily.      amoxicillin-clavulanate 500-125 MG per tablet   Commonly known as: AUGMENTIN   Take 1 tablet (500 mg total) by mouth 2 (two) times daily. Last dose on January 29 in the evening.      beta carotene w/minerals tablet   Take 1 tablet by mouth daily.      calcium carbonate 1250 MG tablet   Commonly known as: OS-CAL - dosed in mg of elemental calcium   Take 1 tablet by mouth daily.      cholecalciferol 1000 UNITS tablet   Commonly known as: VITAMIN D   Take 1,000 Units by mouth daily.      diltiazem 180 MG 24 hr capsule   Commonly known as: CARDIZEM CD   Take 180 mg by mouth daily.      DSS 100 MG Caps   Take 100 mg by mouth 2 (two) times daily.      folic acid 1 MG tablet   Commonly known as: FOLVITE   Take 1 mg by mouth daily.       furosemide 40 MG tablet   Commonly known as: LASIX   Take 40 mg by mouth at bedtime.      furosemide 80 MG tablet   Commonly known as: LASIX   Take 80 mg by mouth every morning.      glucosamine-chondroitin 500-400 MG tablet   Take 1 tablet by mouth 3 (three) times daily.      Iron 325 (65 FE) MG Tabs   Take 1 tablet by mouth daily.      levothyroxine 100 MCG tablet   Commonly known as: SYNTHROID, LEVOTHROID   Take 100 mcg by mouth daily.      lidocaine 5 %   Commonly known as: LIDODERM   Place 1 patch onto the skin daily. Remove & Discard patch within 12 hours or as directed by MD      multivitamin with minerals Tabs   Take 1 tablet by mouth  daily.      omeprazole 40 MG capsule   Commonly known as: PRILOSEC   Take 40 mg by mouth daily.      simvastatin 10 MG tablet   Commonly known as: ZOCOR   Take 10 mg by mouth at bedtime.      warfarin 5 MG tablet   Commonly known as: COUMADIN   Take 5 mg by mouth daily.        The results of significant diagnostics from this hospitalization (including imaging, microbiology, ancillary and laboratory) are listed below for reference.    Significant Diagnostic Studies: Dg Chest 2 View  06/06/2012  *RADIOLOGY REPORT*  Clinical Data: Altered mental status question fall, bruising right elbow  CHEST - 2 VIEW  Comparison: 05/31/2012  Findings: Lower lateral left costal margin excluded. Upper normal heart size. Atherosclerotic calcification aorta. Slight pulmonary vascular congestion. Right basilar effusion and atelectasis. Underlying emphysematous and bronchitic changes with atelectasis at left base. Minimal biapical scarring. No pneumothorax. Bones demineralized.  IMPRESSION: Upper normal heart size with slight pulmonary vascular congestion. Changes of COPD with left basilar atelectasis and increased atelectasis and pleural effusion at right base.   Original Report Authenticated By: Ulyses Southward, M.D.    Dg Elbow 2 Views Right  06/06/2012   *RADIOLOGY REPORT*  Clinical Data: Altered mental status, left elbow bruising question fall  RIGHT ELBOW - 2 VIEW  Comparison: None  Findings: Osseous demineralization. Joint spaces preserved. No acute fracture, dislocation or bone destruction. Regional soft tissue swelling greatest dorsal to the proximal forearm and elbow. No elbow joint effusion.  IMPRESSION: No acute osseous abnormalities.   Original Report Authenticated By: Ulyses Southward, M.D.    Ct Head Wo Contrast  06/06/2012  *RADIOLOGY REPORT*  Clinical Data: Altered mental status, confusion, fall, history stroke and pulmonary hypertension  CT HEAD WITHOUT CONTRAST  Technique:  Contiguous axial images were obtained from the base of the skull through the vertex without contrast.  Comparison: 05/28/2012  Findings: Generalized atrophy. Normal ventricular morphology. No midline shift or mass effect. Otherwise normal appearance of brain parenchyma. No intracranial hemorrhage, mass lesion or evidence of acute infarction. No definite extra-axial fluid collections. Right phthisis bulbi. Extensive atherosclerotic calcification of internal carotid and vertebral arteries at skull base. Osseous demineralization. Visualized paranasal sinuses and mastoid air cells clear.  IMPRESSION: Age-related generalized atrophy. No definite acute intracranial abnormalities.   Original Report Authenticated By: Ulyses Southward, M.D.    Mr Brain Wo Contrast  06/06/2012  *RADIOLOGY REPORT*  Clinical Data: Confusion and hallucinations.  Delirium.  MRI HEAD WITHOUT CONTRAST  Technique:  Multiplanar, multiecho pulse sequences of the brain and surrounding structures were obtained according to standard protocol without intravenous contrast.  Comparison: CT 06/06/2012  Findings: Normal brain volume for age .  Small white matter hyperintensities bilaterally compatible with mild chronic microvascular ischemia.  Brainstem and cerebellum are normal.  No acute infarct.  Negative for intracranial  hemorrhage.  No subdural fluid collection.  No mass or edema is present.  Ventricle size is normal.  Paranasal sinuses are clear. Mild mucosal thickening right mastoid sinus.  Chronic atrophy of the right eye.  IMPRESSION: No acute abnormality.  Age appropriate atrophy and chronic microvascular ischemic change.   Original Report Authenticated By: Janeece Riggers, M.D.    Microbiology: Recent Results (from the past 240 hour(s))  BODY FLUID CULTURE     Status: Normal   Collection Time   05/31/12 10:56 AM      Component Value Range  Status Comment   Specimen Description FLUID RIGHT PLEURAL   Final    Special Requests Normal   Final    Gram Stain     Final    Value: RARE WBC PRESENT, PREDOMINANTLY MONONUCLEAR     NO ORGANISMS SEEN   Culture NO GROWTH 3 DAYS   Final    Report Status 06/03/2012 FINAL   Final   URINE CULTURE     Status: Normal   Collection Time   06/06/12  9:13 AM      Component Value Range Status Comment   Specimen Description URINE, CATHETERIZED   Final    Special Requests ADDED [id number]   Final    Culture  Setup Time 06/06/2012 18:34   Final    Colony Count NO GROWTH   Final    Culture NO GROWTH   Final    Report Status 06/07/2012 FINAL   Final      Labs: Basic Metabolic Panel:  Lab 06/07/12 0981 06/06/12 0900  NA 143 141  K 3.5 3.8  CL 96 92*  CO2 34* 35*  GLUCOSE 140* 158*  BUN 39* 45*  CREATININE 1.50* 1.62*  CALCIUM 9.8 9.9  MG -- --  PHOS -- --   Liver Function Tests:  Lab 06/06/12 0900  AST 28  ALT 15  ALKPHOS 97  BILITOT 0.3  PROT 8.1  ALBUMIN 3.6   CBC:  Lab 06/07/12 0700 06/06/12 0900  WBC 8.0 9.2  NEUTROABS -- --  HGB 10.4* 11.3*  HCT 32.1* 33.5*  MCV 94.7 93.8  PLT 362 401*   CBG:  Lab 06/07/12 1113  GLUCAP 118*    Principal Problem:  *Acute encephalopathy Active Problems:  Hypothyroidism  Chronic respiratory failure with hypoxia  CKD (chronic kidney disease) stage 3, GFR 30-59 ml/min  UTI (lower urinary tract infection)   Atrial fibrillation   Time coordinating discharge:  25  Signed:  [name], MD [facility] 06/10/2012, 9:54 AM

## 2012-06-10 NOTE — Progress Notes (Addendum)
[facility] PROGRESS NOTE  [name] ZOX:[id number] DOB: 1924/05/23 DOA: 06/06/2012 PCP: [name], MD Pulmonologist: [name], M.D.  Cardiologist: [name], M.D.  Assessment/Plan: 1. Acute encephalopathy, delirium: Resolved. Urine culture negative here, however patient was on Rocephin prior to admission. Urinalysis equivocal at nursing facility, culture pending. Recent blood cultures and pleural fluid culture on revealing on prior admission. MRI brain negative. TSH within normal limits. 2. Suspected UTI: Plan as above 3. Skin tears: Continue wound care. 4. Acute renal failure/Suspected chronic kidney disease stage III-4: Acute component appears stable. BUN, creatinine, bicarbonate without significant change. On previous hospitalizations for heart failure she was diuresed with the goal to keep on a "dry side".  5. Chronic respiratory failure with hypoxia: Appears stable. Continue oxygen. 6. Chronic diastolic heart failure: Stable. 7. Mild to moderate aortic stenosis and mild to moderate mitral stenosis with moderate to severe mitral regurgitation: not candidate for intervention per cardiology 8. Pulmonary hypertension, moderate  9. Atrial fibrillation: Continue amiodarone. Per cardiology warfarin to keep INR between 2 and 2.5. Also amiodarone 200 BID for 2 weeks then go to once a day, approximately February 6.  10. Recurrent right pleural effusion: Appears stable from last discharge. 11. Hypothyroidism: Stable 12. History of stroke: Stable. Continue Aggrenox, Zocor.    Code Status: CPR, no intubation  Family Communication:  Disposition Plan: Return to skilled nursing facility today.   [name], MD  [facility] Team 5 Pager [phone] If 7PM-7AM, please contact night-coverage at [email or web address], password [facility] 06/10/2012, 9:44 AM  LOS: 4 days   Brief narrative: 79 year old woman with 2 hospitalizations in the last 3 weeks presented from skilled nursing  facility with 48 hours of confusion, hallucinations. Initial workup in the emergency department was unrevealing the patient was referred for further evaluation.  Consultants:  None  Procedures:  None  Antibiotics:  Ceftriaxone 1/23 >> 1/26  Augmentin 1/27 >> 1/20  HPI/Subjective: Afebrile, stable vital signs. Feels the best that she has a long-time.  Objective: Filed Vitals:   06/09/12 1746 06/09/12 2122 06/10/12 0504 06/10/12 0858  BP: 129/45 126/59 112/49 114/61  Pulse: 77 78 70 68  Temp: 98.3 F (36.8 C) 98.4 F (36.9 C) 98.4 F (36.9 C) 98.8 F (37.1 C)  TempSrc: Oral Oral Oral Oral  Resp: 18 18 16 18   Height:      Weight:  70.3 kg (154 lb 15.7 oz)    SpO2: 100% 96% 97% 94%    Intake/Output Summary (Last 24 hours) at 06/10/12 0944 Last data filed at 06/10/12 0900  Gross per 24 hour  Intake    420 ml  Output    902 ml  Net   -482 ml   Filed Weights   06/06/12 1639 06/08/12 2040 06/09/12 2122  Weight: 75.751 kg (167 lb) 70.5 kg (155 lb 6.8 oz) 70.3 kg (154 lb 15.7 oz)    Exam:  General:  Appears calm. Cardiovascular: RRR, no m/r/g. No LE edema. Respiratory: CTA bilaterally, no w/r/r. Normal respiratory effort. Psychiatric: Alert appears oriented. Skin: Skin tear left elbow healing well, bruising resolving, no erythema or edema. Skin tear left lower leg healing well, no bruising.  Data Reviewed: Basic Metabolic Panel:  Lab 06/07/12 8295 06/06/12 0900  NA 143 141  K 3.5 3.8  CL 96 92*  CO2 34* 35*  GLUCOSE 140* 158*  BUN 39* 45*  CREATININE 1.50* 1.62*  CALCIUM 9.8 9.9  MG -- --  PHOS -- --   Liver Function Tests:  Lab 06/06/12 0900  AST 28  ALT 15  ALKPHOS 97  BILITOT 0.3  PROT 8.1  ALBUMIN 3.6   CBC:  Lab 06/07/12 0700 06/06/12 0900  WBC 8.0 9.2  NEUTROABS -- --  HGB 10.4* 11.3*  HCT 32.1* 33.5*  MCV 94.7 93.8  PLT 362 401*    Recent Results (from the past 240 hour(s))  BODY FLUID CULTURE     Status: Normal   Collection  Time   05/31/12 10:56 AM      Component Value Range Status Comment   Specimen Description FLUID RIGHT PLEURAL   Final    Special Requests Normal   Final    Gram Stain     Final    Value: RARE WBC PRESENT, PREDOMINANTLY MONONUCLEAR     NO ORGANISMS SEEN   Culture NO GROWTH 3 DAYS   Final    Report Status 06/03/2012 FINAL   Final   URINE CULTURE     Status: Normal   Collection Time   06/06/12  9:13 AM      Component Value Range Status Comment   Specimen Description URINE, CATHETERIZED   Final    Special Requests ADDED [id number]   Final    Culture  Setup Time 06/06/2012 18:34   Final    Colony Count NO GROWTH   Final    Culture NO GROWTH   Final    Report Status 06/07/2012 FINAL   Final      Studies: No results found.  Scheduled Meds:    . amiodarone  200 mg Oral BID  . cefTRIAXone (ROCEPHIN)  IV  1 g Intravenous Q24H  . diltiazem  180 mg Oral Daily  . furosemide  80 mg Oral q morning - 10a  . levothyroxine  100 mcg Oral Q breakfast  . pantoprazole  40 mg Oral Daily  . simvastatin  10 mg Oral QHS  . sodium chloride  3 mL Intravenous Q12H  . Warfarin - Pharmacist Dosing Inpatient   Does not apply q1800   Continuous Infusions:   Principal Problem:  *Acute encephalopathy Active Problems:  Hypothyroidism  Chronic respiratory failure with hypoxia  CKD (chronic kidney disease) stage 3, GFR 30-59 ml/min  UTI (lower urinary tract infection)  Atrial fibrillation     [name], MD  [facility] Team 5 Pager [phone] If 7PM-7AM, please contact night-coverage at [email or web address], password [facility] 06/10/2012, 9:44 AM  LOS: 4 days

## 2012-07-17 ENCOUNTER — Encounter: Payer: Self-pay | Admitting: Internal Medicine

## 2012-07-17 ENCOUNTER — Ambulatory Visit (INDEPENDENT_AMBULATORY_CARE_PROVIDER_SITE_OTHER): Payer: Medicare Other | Admitting: Internal Medicine

## 2012-07-17 ENCOUNTER — Other Ambulatory Visit (INDEPENDENT_AMBULATORY_CARE_PROVIDER_SITE_OTHER): Payer: Medicare Other

## 2012-07-17 ENCOUNTER — Ambulatory Visit (INDEPENDENT_AMBULATORY_CARE_PROVIDER_SITE_OTHER)
Admission: RE | Admit: 2012-07-17 | Discharge: 2012-07-17 | Disposition: A | Discharge status: Home / Self Care | Payer: Medicare Other | Source: Ambulatory Visit | Attending: Internal Medicine | Admitting: Internal Medicine

## 2012-07-17 VITALS — BP 120/50 | HR 82 | Temp 97.6°F | Ht 64.5 in | Wt 166.8 lb

## 2012-07-17 DIAGNOSIS — R0600 Dyspnea, unspecified: Secondary | ICD-10-CM

## 2012-07-17 DIAGNOSIS — R0989 Other specified symptoms and signs involving the circulatory and respiratory systems: Secondary | ICD-10-CM

## 2012-07-17 DIAGNOSIS — J9611 Chronic respiratory failure with hypoxia: Secondary | ICD-10-CM

## 2012-07-17 DIAGNOSIS — J90 Pleural effusion, not elsewhere classified: Secondary | ICD-10-CM

## 2012-07-17 DIAGNOSIS — R0609 Other forms of dyspnea: Secondary | ICD-10-CM

## 2012-07-17 DIAGNOSIS — J9610 Chronic respiratory failure, unspecified whether with hypoxia or hypercapnia: Secondary | ICD-10-CM

## 2012-07-17 DIAGNOSIS — R0902 Hypoxemia: Secondary | ICD-10-CM

## 2012-07-17 LAB — CBC WITH DIFFERENTIAL/PLATELET
Basophils Relative: 0.2 % (ref 0.0–3.0)
Eosinophils Absolute: 0.3 10*3/uL (ref 0.0–0.7)
Hemoglobin: 10 g/dL — ABNORMAL LOW (ref 12.0–15.0)
MCHC: 32.8 g/dL (ref 30.0–36.0)
MCV: 90.9 fl (ref 78.0–100.0)
Monocytes Absolute: 1.2 10*3/uL — ABNORMAL HIGH (ref 0.1–1.0)
Neutro Abs: 6.6 10*3/uL (ref 1.4–7.7)
Neutrophils Relative %: 74.2 % (ref 43.0–77.0)
RBC: 3.34 Mil/uL — ABNORMAL LOW (ref 3.87–5.11)

## 2012-07-17 LAB — BASIC METABOLIC PANEL
CO2: 31 mEq/L (ref 19–32)
Calcium: 9.4 mg/dL (ref 8.4–10.5)
Glucose, Bld: 116 mg/dL — ABNORMAL HIGH (ref 70–99)
Sodium: 136 mEq/L (ref 135–145)

## 2012-07-17 NOTE — Patient Instructions (Addendum)
Please remember to go to the lab and x-ray department downstairs for your tests - we will call you with the results when they are available.  Change 02 to 2lpm 24/7 except increase to 4 lpm with walking  Please see patient coordinator before you leave today  to schedule ambulatory/ continuous 02  Please schedule a follow up office visit in 2 weeks, sooner if needed

## 2012-07-17 NOTE — Progress Notes (Signed)
Subjective:    Patient ID: [name], female    DOB: 1925/05/06   MRN: [id number]   Brief patient profile:  87 yowf quit smoking around 2000 with no resp problems with new onset doe since 2012 referred to pulmonary clinic 03/01/2012 by Dr [name].  03/01/2012 1st pulmonary eval/ Wert cc doe x across the room x one year but only under two circumstances:  to go to the BR when wakes up at night to urinate (note does not wake up due to pnd) worse in am p stirs   but recovers p first hour better since started back on lasix  Despite min hx of any leg swelling and no correlation between severity of leg swelling and doe. Sob never occurs at rest.  rec   Start 02 hs  Admit date: 06/06/2012  Discharge date: 06/10/2012  Discharge Diagnoses:  1. Acute encephalopathy, delirium 2. Suspected UTI 3. Superficial skin tears 4. Acute renal failure  5. Chronic respiratory failure, stable 6. Atrial fibrillation, stable    Hospital Course:  [name] was admitted for further evaluation and treatment of acute encephalopathy, delirium. Urinalysis obtained prior to admission was equivocal culture report from that facility was never received. However symptoms and history are most suggestive of UTI with delirium and patient was placed on empiric Rocephin with complete resolution of encephalopathy and delirium over the course of 48 hours. She will complete a course of antibiotics orally as an outpatient. Patient had been prescribed gabapentin as well as narcotics and tramadol however per the facility she had not received this. Her only new medications from previous discharge were warfarin and amiodarone. Her family had noticed brief tremors/transient muscle spasms over the last month or so. There is no evidence of seizure activity. Given resolution of acute encephalopathy on amiodarone no changes were made. Mild acute renal failure resolved. Other chronic issues remained stable during this hospitalization and are detailed  below. She has not required any pain medication here other than Tylenol.  1. Acute encephalopathy, delirium: Resolved. Urine culture negative here, however patient was on Rocephin prior to admission. Urinalysis equivocal at nursing facility, culture pending. Recent blood cultures and pleural fluid culture on revealing on prior admission. MRI brain negative. TSH within normal limits.  2. Suspected UTI: Plan as above  3. Skin tears: Continue wound care (Steri-Strips, keep clean and dry).  4. Acute renal failure/Suspected chronic kidney disease stage III-4: Acute component appears stable. BUN, creatinine, bicarbonate without significant change. On previous hospitalizations for heart failure she was diuresed with the goal to keep on a "dry side".  5. Chronic respiratory failure with hypoxia: Appears stable. Continue oxygen.  6. Chronic diastolic heart failure: Stable.  7. Mild to moderate aortic stenosis and mild to moderate mitral stenosis with moderate to severe mitral regurgitation: not candidate for intervention per cardiology  8. Pulmonary hypertension, moderate  9. Atrial fibrillation: Continue amiodarone. Per cardiology warfarin to keep INR between 2 and 2.5. Also amiodarone 200 BID for 2 weeks then go to once a day, approximately February 6.  10. Recurrent right pleural effusion: Appears stable from last discharge.  11. Hypothyroidism: Stable  12. History of stroke: Stable. Continue Aggrenox, Zocor.   07/17/2012 f/u ov/Wert cc gradually worse x 3 weeks sob, diuretics being adjusted by Donnie Aho. Has begun using 02 during the day as well.  Sob x across the room on Ra, better on 02 with 30 degree orthopnea.  No obvious daytime variabilty or assoc chronic cough or cp  or chest tightness, subjective wheeze overt sinus or hb symptoms. No unusual exp hx    Sleeping ok without nocturnal  or early am exacerbation  of respiratory  c/o's or need for noct saba. Also denies any obvious fluctuation of symptoms  with weather or environmental changes or other aggravating or alleviating factors except as outlined above .  ROS  The following are not active complaints unless bolded sore throat, dysphagia, dental problems, itching, sneezing,  nasal congestion or excess/ purulent secretions, ear ache,   fever, chills, sweats, unintended wt loss, pleuritic or exertional cp, hemoptysis,  orthopnea pnd or leg swelling, presyncope, palpitations, heartburn, abdominal pain, anorexia, nausea, vomiting, diarrhea  or change in bowel or urinary habits, change in stools or urine, dysuria,hematuria,  rash, arthralgias, visual complaints, headache, numbness weakness or ataxia or problems with walking or coordination,  change in mood/affect or memory.         Objective:   Physical Exam  Frail but pleasant amb wf nad needs 2 person assist to get on exam table  07/17/2012   166 Wt Readings from Last 3 Encounters:  03/01/12 161 lb 9.6 oz (73.301 kg)  08/04/11 168 lb (76.204 kg)    HEENT: nl dentition, turbinates, and orophanx. Nl external ear canals without cough reflex   NECK :  without JVD/Nodes/TM/ nl carotid upstrokes bilaterally   LUNGS: no acc muscle use, decreased bs with dullness on R base  CV:  RRR  no s3 or murmur or increase in P2, no edema   ABD:  soft and nontender with nl excursion in the supine position. No bruits or organomegaly, bowel sounds nl  MS:  warm without deformities, calf tenderness, cyanosis or clubbing  SKIN: warm and dry without lesions    NEURO:  alert, approp, no deficits      CXR  07/17/2012 :  1. Slight interval increase in moderate to large right-sided pleural effusion.       Assessment & Plan:

## 2012-07-17 NOTE — Progress Notes (Signed)
Quick Note:  Spoke with pt and notified of results per Dr. Wert. Pt verbalized understanding and denied any questions.  ______ 

## 2012-07-18 NOTE — Progress Notes (Signed)
Quick Note:  Spoke with pt and notified of results per Dr. Wert. Pt verbalized understanding and denied any questions.  ______ 

## 2012-07-18 NOTE — Assessment & Plan Note (Signed)
-   Echo 11/13/08 c/w AS/MS and mod PAH - Spirometry 02/21/12 mostly restrictive changes with ratio 64 - ONO RA 03/05/12 > 267 min @ < 89% so rec 2lpm and repeat ono on 2lpm 03/06/2012 > resolved by ono 2lpm 03/21/12 - 07/17/2012 02 sat 87% at rest then  Walked 2lpm x one lap @ 185 stopped due to  desat 84%> resolved on 4lpm  Multifactorial, worry about amiodarone effects or occult infection but suspect most of the problem is restrictive changes exacerbated by R effusion > diuretic per Donnie Aho, go to 24 02

## 2012-07-18 NOTE — Assessment & Plan Note (Addendum)
-   R thoracentesis 04/18/12  x 1400 cc transudate  -Repeat 05/18/11  X 1300 cc   Clearly hydrostatic and presumably related to valvular heart dz and CRI with tsh and albumin on 05/2011 > rx Donnie Aho with retap prn   She is rapidly approaching end of life concerns and nothing further to add in this clinic x for 02 > address at fu ov

## 2012-07-22 ENCOUNTER — Encounter: Payer: Self-pay | Admitting: Internal Medicine

## 2012-07-22 ENCOUNTER — Ambulatory Visit (INDEPENDENT_AMBULATORY_CARE_PROVIDER_SITE_OTHER): Payer: Medicare Other | Admitting: Internal Medicine

## 2012-07-22 VITALS — BP 130/60 | HR 73 | Temp 97.2°F | Ht 64.5 in | Wt 167.0 lb

## 2012-07-22 DIAGNOSIS — R0902 Hypoxemia: Secondary | ICD-10-CM

## 2012-07-22 DIAGNOSIS — Z79899 Other long term (current) drug therapy: Secondary | ICD-10-CM

## 2012-07-22 DIAGNOSIS — J9610 Chronic respiratory failure, unspecified whether with hypoxia or hypercapnia: Secondary | ICD-10-CM

## 2012-07-22 DIAGNOSIS — Z5181 Encounter for therapeutic drug level monitoring: Secondary | ICD-10-CM

## 2012-07-22 DIAGNOSIS — J9611 Chronic respiratory failure with hypoxia: Secondary | ICD-10-CM

## 2012-07-22 DIAGNOSIS — J90 Pleural effusion, not elsewhere classified: Secondary | ICD-10-CM

## 2012-07-22 MED ORDER — SPIRONOLACTONE 25 MG PO TABS: 25.0000 mg | ORAL_TABLET | Freq: Every day | ORAL | Status: DC

## 2012-07-22 NOTE — Patient Instructions (Addendum)
Change 02 to 2lpm 24/7 except increase to 4 lpm with walking anywhere  Add aldactone 25  mg each am and continue Lasix 80 mg   I will let Dr Donnie Aho know what we did today and about the high sed rate which may suggest your amiodarone is causing you problems.   For drainage / cough ok to try chlortrimeton 4 mg every 6 hours

## 2012-07-23 DIAGNOSIS — Z79899 Other long term (current) drug therapy: Secondary | ICD-10-CM | POA: Insufficient documentation

## 2012-07-23 NOTE — Assessment & Plan Note (Signed)
ESR 120 07/17/12   Concerned about occult incipient amio toxicity given high esr but no cough or convincing non-chf ild so defer further rx concerns  to Dr Donnie Aho as nothing else to do at this point x 02 / diuretics from pulmonary perspective

## 2012-07-23 NOTE — Assessment & Plan Note (Signed)
-   R thoracentesis 04/18/12  x 1400 cc transudate  - Repeat 05/17/12  X 1300 cc   Clinically gradually worse on lasix 80 so add aldactone but only at 25 mg daily as k 4.6 on no K supplements, reccheck bmet one week

## 2012-07-23 NOTE — Progress Notes (Signed)
Subjective:    Patient ID: DEAN WONDER, female    DOB: 1924/05/20   MRN: [id number]   Brief patient profile:  87 yowf quit smoking around 2000 with no resp problems with new onset doe since 2012 referred to pulmonary clinic 03/01/2012 by Dr [name].  03/01/2012 1st pulmonary eval/ Wert cc doe x across the room x one year but only under two circumstances:  to go to the [facility] when wakes up at night to urinate (note does not wake up due to pnd) worse in am p stirs   but recovers p first hour better since started back on lasix  Despite min hx of any leg swelling and no correlation between severity of leg swelling and doe. Sob never occurs at rest.  rec   Start 02 hs  Admit date: 06/06/2012  Discharge date: 06/10/2012  Discharge Diagnoses:  1. Acute encephalopathy, delirium 2. Suspected UTI 3. Superficial skin tears 4. Acute renal failure  5. Chronic respiratory failure, stable 6. Atrial fibrillation, stable    Hospital Course:  Ms. Gerilyn Pilgrim was admitted for further evaluation and treatment of acute encephalopathy, delirium. Urinalysis obtained prior to admission was equivocal culture report from that facility was never received. However symptoms and history are most suggestive of UTI with delirium and patient was placed on empiric Rocephin with complete resolution of encephalopathy and delirium over the course of 48 hours. She will complete a course of antibiotics orally as an outpatient. Patient had been prescribed gabapentin as well as narcotics and tramadol however per the facility she had not received this. Her only new medications from previous discharge were warfarin and amiodarone. Her family had noticed brief tremors/transient muscle spasms over the last month or so. There is no evidence of seizure activity. Given resolution of acute encephalopathy on amiodarone no changes were made. Mild acute renal failure resolved. Other chronic issues remained stable during this hospitalization and are detailed  below. She has not required any pain medication here other than Tylenol.  1. Acute encephalopathy, delirium: Resolved. Urine culture negative here, however patient was on Rocephin prior to admission. Urinalysis equivocal at nursing facility, culture pending. Recent blood cultures and pleural fluid culture on revealing on prior admission. MRI brain negative. TSH within normal limits.  2. Suspected UTI: Plan as above  3. Skin tears: Continue wound care (Steri-Strips, keep clean and dry).  4. Acute renal failure/Suspected chronic kidney disease stage III-4: Acute component appears stable. BUN, creatinine, bicarbonate without significant change. On previous hospitalizations for heart failure she was diuresed with the goal to keep on a "dry side".  5. Chronic respiratory failure with hypoxia: Appears stable. Continue oxygen.  6. Chronic diastolic heart failure: Stable.  7. Mild to moderate aortic stenosis and mild to moderate mitral stenosis with moderate to severe mitral regurgitation: not candidate for intervention per cardiology  8. Pulmonary hypertension, moderate  9. Atrial fibrillation: Continue amiodarone. Per cardiology warfarin to keep INR between 2 and 2.5. Also amiodarone 200 BID for 2 weeks then go to once a day, approximately February 6.  10. Recurrent right pleural effusion: Appears stable from last discharge.  11. Hypothyroidism: Stable  12. History of stroke: Stable. Continue Aggrenox, Zocor.   07/17/2012 f/u ov/Wert cc gradually worse x 3 weeks sob, diuretics being adjusted by Donnie Aho. Has begun using 02 during the day as well.  Sob x across the room on Ra, better on 02 with 30 degree orthopnea. rec Please remember to go to the lab and x-ray department  downstairs for your tests - we will call you with the results when they are available. Change 02 to 2lpm 24/7 except increase to 4 lpm with walking Please see patient coordinator before you leave today  to schedule ambulatory/ continuous  02  07/22/12 acute w/in ov Wert/ cc worse sob "across the room" but not increasing 02 with activity as recommended "I can't see to adjust it" - visiting nurse knew to monitor sats with exertion but not the rec to increase to 4lpm.   No obvious daytime variabilty or assoc chronic cough or cp or chest tightness, subjective wheeze overt sinus or hb symptoms. No unusual exp hx    Sleeping ok without nocturnal  or early am exacerbation  of respiratory  c/o's or need for noct saba. Also denies any obvious fluctuation of symptoms with weather or environmental changes or other aggravating or alleviating factors except as outlined above .  ROS  The following are not active complaints unless bolded sore throat, dysphagia, dental problems, itching, sneezing,  nasal congestion or excess/ purulent secretions, ear ache,   fever, chills, sweats, unintended wt loss, pleuritic or exertional cp, hemoptysis,  orthopnea pnd or leg swelling, presyncope, palpitations, heartburn, abdominal pain, anorexia, nausea, vomiting, diarrhea  or change in bowel or urinary habits, change in stools or urine, dysuria,hematuria,  rash, arthralgias, visual complaints, headache, numbness weakness or ataxia or problems with walking or coordination,  change in mood/affect or memory.         Objective:   Physical Exam  Frail but pleasant amb wf nad needs 2 person assist to get on exam table, one person assist to stand from sitting position  07/17/2012   166 > 07/22/12 167  Wt Readings from Last 3 Encounters:  03/01/12 161 lb 9.6 oz (73.301 kg)  08/04/11 168 lb (76.204 kg)    HEENT: nl dentition, turbinates, and orophanx. Nl external ear canals without cough reflex   NECK :  without JVD/Nodes/TM/ nl carotid upstrokes bilaterally   LUNGS: no acc muscle use, decreased bs with dullness on R base  CV:  RRR  no s3 or murmur or increase in P2, no edema   ABD:  soft and nontender with nl excursion in the supine position. No bruits or  organomegaly, bowel sounds nl  MS:  warm without deformities, calf tenderness, cyanosis or clubbing  SKIN: warm and dry without lesions    NEURO:  alert, approp, no deficits      CXR  07/17/2012 :  1. Slight interval increase in moderate to large right-sided pleural effusion.       Assessment & Plan:

## 2012-07-23 NOTE — Assessment & Plan Note (Addendum)
-   Echo 11/13/08 c/w AS/MS and mod PAH - Spirometry 02/21/12 mostly restrictive changes with ratio 64 - ONO RA 03/05/12 > 267 min @ < 89% so rec 2lpm and repeat ono on 2lpm 03/06/2012 > resolved by ono 2lpm 03/21/12 - 07/17/2012 02 sat 87% at rest then  Walked 2lpm x one lap @ 185 stopped due to  desat 84%> resolved on 4lpm  Pt daughter and visiting nurse did not apparently understand my rec to use 4lpm when ambulating and do not feel a change in this rec is needed but do want to push harder to relieve the R effusion if possible (discussed separately ) > no chang e02 recs  Rx = 2lpm resting/ sleeping, 4lpm with any activity

## 2012-07-29 ENCOUNTER — Ambulatory Visit: Payer: Medicare Other | Admitting: Internal Medicine

## 2012-08-02 ENCOUNTER — Encounter: Payer: Self-pay | Admitting: Cardiology

## 2012-09-02 ENCOUNTER — Ambulatory Visit (INDEPENDENT_AMBULATORY_CARE_PROVIDER_SITE_OTHER)
Admission: RE | Admit: 2012-09-02 | Discharge: 2012-09-02 | Disposition: A | Discharge status: Home / Self Care | Payer: Medicare Other | Source: Ambulatory Visit | Attending: Internal Medicine | Admitting: Internal Medicine

## 2012-09-02 ENCOUNTER — Ambulatory Visit (INDEPENDENT_AMBULATORY_CARE_PROVIDER_SITE_OTHER): Payer: Medicare Other | Admitting: Internal Medicine

## 2012-09-02 ENCOUNTER — Other Ambulatory Visit (INDEPENDENT_AMBULATORY_CARE_PROVIDER_SITE_OTHER): Payer: Medicare Other

## 2012-09-02 ENCOUNTER — Encounter: Payer: Self-pay | Admitting: *Deleted

## 2012-09-02 ENCOUNTER — Encounter: Payer: Self-pay | Admitting: Internal Medicine

## 2012-09-02 VITALS — BP 122/82 | HR 78 | Ht 64.5 in | Wt 170.0 lb

## 2012-09-02 DIAGNOSIS — J9611 Chronic respiratory failure with hypoxia: Secondary | ICD-10-CM

## 2012-09-02 DIAGNOSIS — J9610 Chronic respiratory failure, unspecified whether with hypoxia or hypercapnia: Secondary | ICD-10-CM

## 2012-09-02 DIAGNOSIS — R0902 Hypoxemia: Secondary | ICD-10-CM

## 2012-09-02 DIAGNOSIS — Z5181 Encounter for therapeutic drug level monitoring: Secondary | ICD-10-CM

## 2012-09-02 DIAGNOSIS — Z79899 Other long term (current) drug therapy: Secondary | ICD-10-CM

## 2012-09-02 DIAGNOSIS — J90 Pleural effusion, not elsewhere classified: Secondary | ICD-10-CM

## 2012-09-02 DIAGNOSIS — R0602 Shortness of breath: Secondary | ICD-10-CM

## 2012-09-02 LAB — CBC WITH DIFFERENTIAL/PLATELET
Basophils Relative: 1 % (ref 0.0–3.0)
Eosinophils Absolute: 0.2 10*3/uL (ref 0.0–0.7)
HCT: 27.6 % — ABNORMAL LOW (ref 36.0–46.0)
Lymphs Abs: 0.7 10*3/uL (ref 0.7–4.0)
MCHC: 32.6 g/dL (ref 30.0–36.0)
MCV: 89.3 fl (ref 78.0–100.0)
Monocytes Absolute: 0.6 10*3/uL (ref 0.1–1.0)
Neutro Abs: 3.8 10*3/uL (ref 1.4–7.7)
Neutrophils Relative %: 70.5 % (ref 43.0–77.0)
RBC: 3.09 Mil/uL — ABNORMAL LOW (ref 3.87–5.11)

## 2012-09-02 LAB — BASIC METABOLIC PANEL
CO2: 36 mEq/L — ABNORMAL HIGH (ref 19–32)
Calcium: 9.5 mg/dL (ref 8.4–10.5)
Glucose, Bld: 110 mg/dL — ABNORMAL HIGH (ref 70–99)
Sodium: 136 mEq/L (ref 135–145)

## 2012-09-02 NOTE — Assessment & Plan Note (Signed)
-   Echo 11/13/08 c/w AS/MS and mod PAH - Spirometry 02/21/12 mostly restrictive changes with ratio 64 - ONO RA 03/05/12 > 267 min @ < 89% so rec 2lpm and repeat ono on 2lpm 03/06/2012 > resolved by ono 2lpm 03/21/12 - 07/17/2012 02 sat 87% at rest then  Walked 2lpm x one lap @ 185 stopped due to  desat 84%> resolved on 4lpm  Rx  As of 09/02/12 = 3lpm resting/ sleeping, 4lpm with any activity

## 2012-09-02 NOTE — Assessment & Plan Note (Signed)
-   R thoracentesis 04/18/12  x 1400 cc transudate  -Repeat 05/17/12  X 1300 cc  - Add aldactone 25 mg daily effective 07/23/2012   Ideally needs retap but on coumadin, needs more diuresis but risks worsening renal insufficiency  Clearly the high likelihood of prolonging suffering from pulmonary interventions vastly outweighs any reasonable chance of benefit from offering anything else because medical science has done all it can here to restore health.  Therefore  I don't have any additional recs  except to consider hospice sooner rather than later - paradoxically many patients with respiratory diseases live longer and better once a palliative approach is used in this setting.

## 2012-09-02 NOTE — Assessment & Plan Note (Signed)
ESR 120 07/17/12  Down to 105 09/02/2012  Off amiodarone ? date

## 2012-09-02 NOTE — Patient Instructions (Addendum)
Increase 02 to 3lpm 24/7 except 4lpm with activity  For drainage / cough ok to try chlortrimeton 4 mg every 6 hours as needed   Please remember to go to the lab and x-ray department downstairs for your tests - we will call you with the results when they are available.

## 2012-09-02 NOTE — Progress Notes (Signed)
Subjective:    Patient ID: BERNICE MCAULIFFE, female    DOB: Mar 03, 1925   MRN: [id number]   Brief patient profile:  87 yowf quit smoking around 2000 with no resp problems with new onset doe since 2012 referred to pulmonary clinic 03/01/2012 by Dr [name].  03/01/2012 1st pulmonary eval/ Laterra Lubinski cc doe x across the room x one year but only under two circumstances:  to go to the [facility] when wakes up at night to urinate (note does not wake up due to pnd) worse in am p stirs   but recovers p first hour better since started back on lasix  Despite min hx of any leg swelling and no correlation between severity of leg swelling and doe. Sob never occurs at rest.  rec   Start 02 hs  Admit date: 06/06/2012  Discharge date: 06/10/2012  Discharge Diagnoses:  1. Acute encephalopathy, delirium 2. Suspected UTI 3. Superficial skin tears 4. Acute renal failure  5. Chronic respiratory failure, stable 6. Atrial fibrillation, stable    Hospital Course:  Ms. Gerilyn Pilgrim was admitted for further evaluation and treatment of acute encephalopathy, delirium. Urinalysis obtained prior to admission was equivocal culture report from that facility was never received. However symptoms and history are most suggestive of UTI with delirium and patient was placed on empiric Rocephin with complete resolution of encephalopathy and delirium over the course of 48 hours. She will complete a course of antibiotics orally as an outpatient. Patient had been prescribed gabapentin as well as narcotics and tramadol however per the facility she had not received this. Her only new medications from previous discharge were warfarin and amiodarone. Her family had noticed brief tremors/transient muscle spasms over the last month or so. There is no evidence of seizure activity. Given resolution of acute encephalopathy on amiodarone no changes were made. Mild acute renal failure resolved. Other chronic issues remained stable during this hospitalization and are detailed  below. She has not required any pain medication here other than Tylenol.  1. Acute encephalopathy, delirium: Resolved. Urine culture negative here, however patient was on Rocephin prior to admission. Urinalysis equivocal at nursing facility, culture pending. Recent blood cultures and pleural fluid culture on revealing on prior admission. MRI brain negative. TSH within normal limits.  2. Suspected UTI: Plan as above  3. Skin tears: Continue wound care (Steri-Strips, keep clean and dry).  4. Acute renal failure/Suspected chronic kidney disease stage III-4: Acute component appears stable. BUN, creatinine, bicarbonate without significant change. On previous hospitalizations for heart failure she was diuresed with the goal to keep on a "dry side".  5. Chronic respiratory failure with hypoxia: Appears stable. Continue oxygen.  6. Chronic diastolic heart failure: Stable.  7. Mild to moderate aortic stenosis and mild to moderate mitral stenosis with moderate to severe mitral regurgitation: not candidate for intervention per cardiology  8. Pulmonary hypertension, moderate  9. Atrial fibrillation: Continue amiodarone. Per cardiology warfarin to keep INR between 2 and 2.5. Also amiodarone 200 BID for 2 weeks then go to once a day, approximately February 6.  10. Recurrent right pleural effusion: Appears stable from last discharge.  11. Hypothyroidism: Stable  12. History of stroke: Stable. Continue Aggrenox, Zocor.   07/17/2012 f/u ov/Zaccheus Edmister cc gradually worse x 3 weeks sob, diuretics being adjusted by Donnie Aho. Has begun using 02 during the day as well.  Sob x across the room on Ra, better on 02 with 30 degree orthopnea. rec Please remember to go to the lab and x-ray department  downstairs for your tests - we will call you with the results when they are available. Change 02 to 2lpm 24/7 except increase to 4 lpm with walking Please see patient coordinator before you leave today  to schedule ambulatory/ continuous  02  07/22/12 acute w/in ov Damilola Flamm/ cc worse sob "across the room" but not increasing 02 with activity as recommended "I can't see to adjust it" - visiting nurse knew to monitor sats with exertion but not the rec to increase to 4lpm rec Change 02 to 2lpm 24/7 except increase to 4 lpm with walking anywhere Add aldactone 25  mg each am and continue Lasix 80 mg  I will let Dr Donnie Aho know what we did today and about the high sed rate which may suggest your amiodarone is causing you problems.  For drainage / cough ok to try chlortrimeton 4 mg every 6 hours   09/02/2012 f/u ov/Quindarius Cabello re pleural effusion Chief Complaint  Patient presents with  . Acute Visit    Pt c/o increased SOB x 2 wks. She also c/o prod cough with minimal sputum- ? color.    has not followed directions re 02 or 1st gen H21 (can't see to adjust 02 and depends on daughter to fill pill boxes) and meds changed sev times (reduced diuretics, d/c aldactone due to renal failure.  Leg swelling and sob correlate, indolent, progressively worse. Some R post chest discomfort better with heating blanket than tramadol, not classically pleuritic.  No obvious daytime variabilty or assoc chronic cough or cp or chest tightness, subjective wheeze overt sinus or hb symptoms. No unusual exp hx    Sleeping ok without nocturnal  or early am exacerbation  of respiratory  c/o's or need for noct saba. Also denies any obvious fluctuation of symptoms with weather or environmental changes or other aggravating or alleviating factors except as outlined above .  ROS  The following are not active complaints unless bolded sore throat, dysphagia, dental problems, itching, sneezing,  nasal congestion or excess/ purulent secretions, ear ache,   fever, chills, sweats, unintended wt loss, pleuritic or exertional cp, hemoptysis,  orthopnea pnd or leg swelling, presyncope, palpitations, heartburn, abdominal pain, anorexia, nausea, vomiting, diarrhea  or change in bowel or  urinary habits, change in stools or urine, dysuria,hematuria,  rash, arthralgias, visual complaints, headache, numbness weakness or ataxia or problems with walking or coordination,  change in mood/affect or memory.         Objective:   Physical Exam  Frail but pleasant amb wf nad needs 2 person assist to get on exam table, one person assist to stand from sitting position  07/17/2012   166 > 07/22/12 167 > 09/02/2012  170  Wt Readings from Last 3 Encounters:  03/01/12 161 lb 9.6 oz (73.301 kg)  08/04/11 168 lb (76.204 kg)    HEENT: nl dentition, turbinates, and orophanx. Nl external ear canals without cough reflex   NECK :  without JVD/Nodes/TM/ nl carotid upstrokes bilaterally   LUNGS: no acc muscle use, decreased bs with dullness on R base  CV:  RRR  no s3 or murmur or increase in P2, no edema   ABD:  soft and nontender with nl excursion in the supine position. No bruits or organomegaly, bowel sounds nl  MS:  warm without deformities, calf tenderness, cyanosis or clubbing  SKIN: warm and dry without lesions    NEURO:  alert, approp, no deficits      CXR  09/02/2012 : Increased right pleural  effusion.  09/02/12 ESR down from  120 to 105 off amiodarone        Assessment & Plan:

## 2012-09-04 NOTE — Progress Notes (Signed)
Quick Note:  Spoke with pt and notified of results per Dr. Wert. Pt verbalized understanding and denied any questions.  ______ 

## 2012-09-08 ENCOUNTER — Other Ambulatory Visit: Payer: Self-pay

## 2012-09-08 ENCOUNTER — Encounter (HOSPITAL_COMMUNITY): Payer: Self-pay | Admitting: Emergency Medicine

## 2012-09-08 ENCOUNTER — Emergency Department (HOSPITAL_COMMUNITY): Payer: Medicare Other

## 2012-09-08 ENCOUNTER — Inpatient Hospital Stay (HOSPITAL_COMMUNITY)
Admission: EM | Admit: 2012-09-08 | Discharge: 2012-09-17 | DRG: 291 | Disposition: A | Discharge status: Home / Self Care | Payer: Medicare Other | Attending: Family Medicine | Admitting: Family Medicine

## 2012-09-08 DIAGNOSIS — Z803 Family history of malignant neoplasm of breast: Secondary | ICD-10-CM

## 2012-09-08 DIAGNOSIS — R0600 Dyspnea, unspecified: Secondary | ICD-10-CM | POA: Diagnosis present

## 2012-09-08 DIAGNOSIS — I509 Heart failure, unspecified: Secondary | ICD-10-CM

## 2012-09-08 DIAGNOSIS — Z87891 Personal history of nicotine dependence: Secondary | ICD-10-CM

## 2012-09-08 DIAGNOSIS — I050 Rheumatic mitral stenosis: Secondary | ICD-10-CM

## 2012-09-08 DIAGNOSIS — Z8673 Personal history of transient ischemic attack (TIA), and cerebral infarction without residual deficits: Secondary | ICD-10-CM

## 2012-09-08 DIAGNOSIS — J9610 Chronic respiratory failure, unspecified whether with hypoxia or hypercapnia: Secondary | ICD-10-CM | POA: Diagnosis present

## 2012-09-08 DIAGNOSIS — J90 Pleural effusion, not elsewhere classified: Secondary | ICD-10-CM | POA: Diagnosis present

## 2012-09-08 DIAGNOSIS — Z7901 Long term (current) use of anticoagulants: Secondary | ICD-10-CM

## 2012-09-08 DIAGNOSIS — D649 Anemia, unspecified: Secondary | ICD-10-CM

## 2012-09-08 DIAGNOSIS — H5440 Blindness, one eye, unspecified eye: Secondary | ICD-10-CM | POA: Diagnosis present

## 2012-09-08 DIAGNOSIS — H3530 Unspecified macular degeneration: Secondary | ICD-10-CM | POA: Diagnosis present

## 2012-09-08 DIAGNOSIS — E873 Alkalosis: Secondary | ICD-10-CM | POA: Diagnosis present

## 2012-09-08 DIAGNOSIS — I5033 Acute on chronic diastolic (congestive) heart failure: Secondary | ICD-10-CM | POA: Diagnosis present

## 2012-09-08 DIAGNOSIS — R0902 Hypoxemia: Secondary | ICD-10-CM | POA: Diagnosis present

## 2012-09-08 DIAGNOSIS — D631 Anemia in chronic kidney disease: Secondary | ICD-10-CM

## 2012-09-08 DIAGNOSIS — I119 Hypertensive heart disease without heart failure: Secondary | ICD-10-CM

## 2012-09-08 DIAGNOSIS — N184 Chronic kidney disease, stage 4 (severe): Secondary | ICD-10-CM | POA: Diagnosis present

## 2012-09-08 DIAGNOSIS — Z807 Family history of other malignant neoplasms of lymphoid, hematopoietic and related tissues: Secondary | ICD-10-CM

## 2012-09-08 DIAGNOSIS — E559 Vitamin D deficiency, unspecified: Secondary | ICD-10-CM | POA: Diagnosis present

## 2012-09-08 DIAGNOSIS — I951 Orthostatic hypotension: Secondary | ICD-10-CM | POA: Diagnosis present

## 2012-09-08 DIAGNOSIS — I5032 Chronic diastolic (congestive) heart failure: Secondary | ICD-10-CM

## 2012-09-08 DIAGNOSIS — E785 Hyperlipidemia, unspecified: Secondary | ICD-10-CM | POA: Diagnosis present

## 2012-09-08 DIAGNOSIS — G9340 Encephalopathy, unspecified: Secondary | ICD-10-CM

## 2012-09-08 DIAGNOSIS — I130 Hypertensive heart and chronic kidney disease with heart failure and stage 1 through stage 4 chronic kidney disease, or unspecified chronic kidney disease: Principal | ICD-10-CM | POA: Diagnosis present

## 2012-09-08 DIAGNOSIS — I272 Pulmonary hypertension, unspecified: Secondary | ICD-10-CM | POA: Diagnosis present

## 2012-09-08 DIAGNOSIS — E039 Hypothyroidism, unspecified: Secondary | ICD-10-CM | POA: Diagnosis present

## 2012-09-08 DIAGNOSIS — I4891 Unspecified atrial fibrillation: Secondary | ICD-10-CM | POA: Diagnosis present

## 2012-09-08 DIAGNOSIS — I2510 Atherosclerotic heart disease of native coronary artery without angina pectoris: Secondary | ICD-10-CM

## 2012-09-08 DIAGNOSIS — Z8249 Family history of ischemic heart disease and other diseases of the circulatory system: Secondary | ICD-10-CM

## 2012-09-08 DIAGNOSIS — J9611 Chronic respiratory failure with hypoxia: Secondary | ICD-10-CM

## 2012-09-08 DIAGNOSIS — R195 Other fecal abnormalities: Secondary | ICD-10-CM | POA: Diagnosis present

## 2012-09-08 DIAGNOSIS — I052 Rheumatic mitral stenosis with insufficiency: Secondary | ICD-10-CM | POA: Diagnosis present

## 2012-09-08 DIAGNOSIS — M1710 Unilateral primary osteoarthritis, unspecified knee: Secondary | ICD-10-CM | POA: Diagnosis present

## 2012-09-08 DIAGNOSIS — D509 Iron deficiency anemia, unspecified: Secondary | ICD-10-CM | POA: Diagnosis present

## 2012-09-08 DIAGNOSIS — N183 Chronic kidney disease, stage 3 unspecified: Secondary | ICD-10-CM | POA: Diagnosis present

## 2012-09-08 DIAGNOSIS — R0602 Shortness of breath: Secondary | ICD-10-CM | POA: Diagnosis present

## 2012-09-08 DIAGNOSIS — I2789 Other specified pulmonary heart diseases: Secondary | ICD-10-CM | POA: Diagnosis present

## 2012-09-08 DIAGNOSIS — N189 Chronic kidney disease, unspecified: Secondary | ICD-10-CM | POA: Diagnosis present

## 2012-09-08 HISTORY — DX: Anemia in chronic kidney disease: D63.1

## 2012-09-08 LAB — PRO B NATRIURETIC PEPTIDE: Pro B Natriuretic peptide (BNP): 707.4 pg/mL — ABNORMAL HIGH (ref 0–450)

## 2012-09-08 LAB — CBC WITH DIFFERENTIAL/PLATELET
Basophils Absolute: 0.1 10*3/uL (ref 0.0–0.1)
Eosinophils Relative: 5 % (ref 0–5)
Lymphocytes Relative: 12 % (ref 12–46)
Lymphs Abs: 0.5 10*3/uL — ABNORMAL LOW (ref 0.7–4.0)
MCV: 90 fL (ref 78.0–100.0)
Neutro Abs: 2.9 10*3/uL (ref 1.7–7.7)
Neutrophils Relative %: 67 % (ref 43–77)
Platelets: 292 10*3/uL (ref 150–400)
RBC: 2.59 MIL/uL — ABNORMAL LOW (ref 3.87–5.11)
WBC: 4.3 10*3/uL (ref 4.0–10.5)

## 2012-09-08 LAB — BASIC METABOLIC PANEL
CO2: 38 mEq/L — ABNORMAL HIGH (ref 19–32)
Chloride: 98 mEq/L (ref 96–112)
Glucose, Bld: 148 mg/dL — ABNORMAL HIGH (ref 70–99)
Potassium: 4 mEq/L (ref 3.5–5.1)
Sodium: 141 mEq/L (ref 135–145)

## 2012-09-08 LAB — PROTIME-INR
INR: 2.28 — ABNORMAL HIGH (ref 0.00–1.49)
Prothrombin Time: 24.1 seconds — ABNORMAL HIGH (ref 11.6–15.2)

## 2012-09-08 MED ORDER — ACETAMINOPHEN 325 MG PO TABS: 650.0000 mg | ORAL_TABLET | Freq: Four times a day (QID) | ORAL | Status: DC | PRN

## 2012-09-08 MED ORDER — ALBUTEROL SULFATE (5 MG/ML) 0.5% IN NEBU: 2.5000 mg | INHALATION_SOLUTION | RESPIRATORY_TRACT | Status: DC | PRN

## 2012-09-08 MED ORDER — TRAMADOL HCL 50 MG PO TABS
50.0000 mg | ORAL_TABLET | Freq: Two times a day (BID) | ORAL | Status: DC
  Administered 2012-09-08 – 2012-09-17 (×18): 50 mg via ORAL
  Filled 2012-09-08 (×19): qty 1

## 2012-09-08 MED ORDER — FUROSEMIDE 10 MG/ML IJ SOLN
40.0000 mg | Freq: Once | INTRAMUSCULAR | Status: AC
  Administered 2012-09-08: 40 mg via INTRAVENOUS
  Filled 2012-09-08: qty 4

## 2012-09-08 MED ORDER — TRAMADOL HCL 50 MG PO TABS
50.0000 mg | ORAL_TABLET | Freq: Once | ORAL | Status: AC
  Administered 2012-09-08: 50 mg via ORAL
  Filled 2012-09-08: qty 1

## 2012-09-08 MED ORDER — FOLIC ACID 1 MG PO TABS
1.0000 mg | ORAL_TABLET | Freq: Every day | ORAL | Status: DC
  Administered 2012-09-08 – 2012-09-16 (×9): 1 mg via ORAL
  Filled 2012-09-08 (×10): qty 1

## 2012-09-08 MED ORDER — VITAMIN D3 25 MCG (1000 UNIT) PO TABS
1000.0000 [IU] | ORAL_TABLET | Freq: Every morning | ORAL | Status: DC
  Administered 2012-09-09 – 2012-09-17 (×9): 1000 [IU] via ORAL
  Filled 2012-09-08 (×9): qty 1

## 2012-09-08 MED ORDER — SODIUM CHLORIDE 0.9 % IJ SOLN
3.0000 mL | Freq: Two times a day (BID) | INTRAMUSCULAR | Status: DC
  Administered 2012-09-08 – 2012-09-17 (×16): 3 mL via INTRAVENOUS

## 2012-09-08 MED ORDER — SODIUM CHLORIDE 0.9 % IJ SOLN
3.0000 mL | Freq: Two times a day (BID) | INTRAMUSCULAR | Status: DC
  Administered 2012-09-08 – 2012-09-17 (×9): 3 mL via INTRAVENOUS

## 2012-09-08 MED ORDER — GLUCOSAMINE-CHONDROITIN 500-400 MG PO TABS: 1.0000 | ORAL_TABLET | Freq: Two times a day (BID) | ORAL | Status: DC

## 2012-09-08 MED ORDER — ONDANSETRON HCL 4 MG/2ML IJ SOLN: 4.0000 mg | Freq: Four times a day (QID) | INTRAMUSCULAR | Status: DC | PRN

## 2012-09-08 MED ORDER — FERROUS SULFATE 325 (65 FE) MG PO TABS
325.0000 mg | ORAL_TABLET | Freq: Every day | ORAL | Status: DC
  Administered 2012-09-08 – 2012-09-16 (×9): 325 mg via ORAL
  Filled 2012-09-08 (×10): qty 1

## 2012-09-08 MED ORDER — FUROSEMIDE 20 MG PO TABS
20.0000 mg | ORAL_TABLET | Freq: Every morning | ORAL | Status: DC
  Administered 2012-09-09: 20 mg via ORAL
  Filled 2012-09-08: qty 1

## 2012-09-08 MED ORDER — LEVOTHYROXINE SODIUM 137 MCG PO TABS
137.0000 ug | ORAL_TABLET | Freq: Every day | ORAL | Status: DC
  Administered 2012-09-09: 137 ug via ORAL
  Filled 2012-09-08 (×3): qty 1

## 2012-09-08 MED ORDER — OCUVITE PO TABS
1.0000 | ORAL_TABLET | Freq: Two times a day (BID) | ORAL | Status: DC
  Administered 2012-09-08 – 2012-09-17 (×18): 1 via ORAL
  Filled 2012-09-08 (×23): qty 1

## 2012-09-08 MED ORDER — SIMVASTATIN 10 MG PO TABS
10.0000 mg | ORAL_TABLET | Freq: Every day | ORAL | Status: DC
  Administered 2012-09-08 – 2012-09-16 (×9): 10 mg via ORAL
  Filled 2012-09-08 (×10): qty 1

## 2012-09-08 MED ORDER — ONDANSETRON HCL 4 MG PO TABS: 4.0000 mg | ORAL_TABLET | Freq: Four times a day (QID) | ORAL | Status: DC | PRN

## 2012-09-08 MED ORDER — DILTIAZEM HCL ER COATED BEADS 180 MG PO CP24
180.0000 mg | ORAL_CAPSULE | Freq: Every morning | ORAL | Status: DC
  Administered 2012-09-09 – 2012-09-13 (×5): 180 mg via ORAL
  Filled 2012-09-08 (×6): qty 1

## 2012-09-08 MED ORDER — ADULT MULTIVITAMIN W/MINERALS CH
1.0000 | ORAL_TABLET | Freq: Every morning | ORAL | Status: DC
  Administered 2012-09-09 – 2012-09-17 (×9): 1 via ORAL
  Filled 2012-09-08 (×9): qty 1

## 2012-09-08 MED ORDER — ACETAMINOPHEN 650 MG RE SUPP: 650.0000 mg | Freq: Four times a day (QID) | RECTAL | Status: DC | PRN

## 2012-09-08 MED ORDER — PANTOPRAZOLE SODIUM 40 MG PO TBEC: 40.0000 mg | DELAYED_RELEASE_TABLET | Freq: Every day | ORAL | Status: DC

## 2012-09-08 NOTE — ED Notes (Addendum)
Alert, NAD, calm, eating [place] sandwich, SPO2 decreased while eating,  increased to 4L, VSS. Pending inpt bed assignment.

## 2012-09-08 NOTE — ED Notes (Signed)
SOB x1 week. 3L [place] all time, SpO2 on EMS arrival 90%. SpO2 97% on ED arrival.

## 2012-09-08 NOTE — Progress Notes (Signed)
ANTICOAGULATION CONSULT NOTE - Initial Consult  Pharmacy Consult for heparin Indication: atrial fibrillation  Allergies  Allergen Reactions  . Lisinopril Other (See Comments)    pancreatitis    Patient Measurements: Height: 5\' 5"  (165.1 cm) Weight: 166 lb 3.2 oz (75.388 kg) IBW/kg (Calculated) : 57 Heparin Dosing Weight: 72kg  Vital Signs: Temp: 97 F (36.1 C) (04/27 2220) Temp src: Oral (04/27 1809) BP: 142/7 mmHg (04/27 2220) Pulse Rate: 80 (04/27 2220)  Labs:  Recent Labs  09/08/12 1823  HGB 7.4*  HCT 23.3*  PLT 292  LABPROT 24.1*  INR 2.28*  CREATININE 1.28*  TROPONINI <0.30    Estimated Creatinine Clearance: 32.7 ml/min (by C-G formula based on Cr of 1.28).   Medical History: Past Medical History  Diagnosis Date  . Stroke   . Anemia   . Hyperlipidemia   . Vitamin D deficiency   . Hypothyroid   . Pulmonary hypertension   . Chronic respiratory failure with hypoxia   . Blindness of right eye     Macular degeneration  . Macular degeneration   . Hypertensive heart disease 04/16/2012  . CHF (congestive heart failure)   . Hypertension   . UTI (urinary tract infection)   . Arthritis     KNEES    Medications:  Prescriptions prior to admission  Medication Sig Dispense Refill  . beta carotene w/minerals (OCUVITE) tablet Take 1 tablet by mouth 2 (two) times daily.       . cholecalciferol (VITAMIN D) 1000 UNITS tablet Take 1,000 Units by mouth every morning.       . diltiazem (CARDIZEM CD) 180 MG 24 hr capsule Take 180 mg by mouth every morning.       . Ferrous Sulfate (IRON) 325 (65 FE) MG TABS Take 1 tablet by mouth at bedtime.       . folic acid (FOLVITE) 1 MG tablet Take 1 mg by mouth at bedtime.       . furosemide (LASIX) 20 MG tablet Take 20 mg by mouth every morning.       Marland Kitchen glucosamine-chondroitin 500-400 MG tablet Take 1 tablet by mouth 2 (two) times daily.       Marland Kitchen levothyroxine (SYNTHROID, LEVOTHROID) 137 MCG tablet Take 137 mcg by mouth daily  before breakfast.      . Multiple Vitamin (MULITIVITAMIN WITH MINERALS) TABS Take 1 tablet by mouth every morning.       Marland Kitchen omeprazole (PRILOSEC) 40 MG capsule Take 40 mg by mouth every morning.       . simvastatin (ZOCOR) 10 MG tablet Take 10 mg by mouth at bedtime.      . traMADol (ULTRAM) 50 MG tablet Take 50 mg by mouth 2 (two) times daily.       Marland Kitchen warfarin (COUMADIN) 5 MG tablet Take 2.5-5 mg by mouth See admin instructions. On Saturday take 2.5mg  & take 5mg  on all other days       Scheduled:  . beta carotene w/minerals  1 tablet Oral BID  . [START ON 09/09/2012] cholecalciferol  1,000 Units Oral q morning - 10a  . [START ON 09/09/2012] diltiazem  180 mg Oral q morning - 10a  . ferrous sulfate  325 mg Oral QHS  . folic acid  1 mg Oral QHS  . [COMPLETED] furosemide  40 mg Intravenous Once  . [START ON 09/09/2012] furosemide  20 mg Oral q morning - 10a  . [START ON 09/09/2012] levothyroxine  137 mcg Oral QAC breakfast  . [  START ON 09/09/2012] multivitamin with minerals  1 tablet Oral q morning - 10a  . [START ON 09/09/2012] pantoprazole  40 mg Oral Daily  . simvastatin  10 mg Oral QHS  . sodium chloride  3 mL Intravenous Q12H  . sodium chloride  3 mL Intravenous Q12H  . [COMPLETED] traMADol  50 mg Oral Once  . traMADol  50 mg Oral BID  . [DISCONTINUED] glucosamine-chondroitin  1 tablet Oral BID    Assessment: 79yo female c/o SOB x1wk, on 3L [place] at all times, has h/o recurrent pleural effusions requiring thoracentesis, was recently seen by MD who notes pleural effusion was increasing but was not candidate for thoracentesis at the time d/t Coumadin, now being admitted for heparin transition, currently with therapeutic INR.  Goal of Therapy:  Heparin level 0.3-0.7 units/ml Monitor platelets by anticoagulation protocol: Yes   Plan:  Will monitor INR and begin heparin when INR <2.  Vernard Gambles, PharmD, BCPS  09/08/2012,10:27 PM

## 2012-09-08 NOTE — ED Notes (Signed)
Pt alert, NAD, calm, interactive, resps e/u, speaking in clear complete sentences, pt & family updated, family at [facility] x2, 2nd IV established, EDP resident in to room.

## 2012-09-08 NOTE — ED Provider Notes (Signed)
History     CSN: [id number]  Arrival date & time 09/08/12  1719   First MD Initiated Contact with Patient 09/08/12 1720      Chief Complaint  Patient presents with  . Shortness of Breath    (Consider location/radiation/quality/duration/timing/severity/associated sxs/prior treatment) HPI Comments: 51 y F with PMH of diastolic CHF last EF measured was 55% on December 2013 mitral fibrillation on Coumadin, recurrent pleural effusion transudative had required thoracentesis x3 previously, aortic stenosis/mitral regurgitation and stenosis, chronic kidney disease who presents due to worsening shortness of breath.  Lying flat makes it worse.  Pt feels this episode is similar to prior effusions requiring drainage.  She also reports that she was seen by her Pulmonologist earlier this week and that she was told she would likely need admission for thoracentesis.  No fevers, chills, cough, n/v, diarrhea, abd pain or other complaints.  She has had to increase her O2 usage to 4L all the time.   Patient is a 79 y.o. female presenting with shortness of breath. The history is provided by the patient and a relative.  Shortness of Breath Severity:  Moderate Onset quality:  Gradual Timing:  Intermittent Progression:  Worsening Chronicity:  Recurrent   Past Medical History  Diagnosis Date  . Stroke   . Anemia   . Hyperlipidemia   . Vitamin D deficiency   . Hypothyroid   . Pulmonary hypertension   . Chronic respiratory failure with hypoxia   . Blindness of right eye     Macular degeneration  . Macular degeneration   . Hypertensive heart disease 04/16/2012  . CHF (congestive heart failure)   . Hypertension   . UTI (urinary tract infection)   . Arthritis     KNEES    Past Surgical History  Procedure Laterality Date  . Cataract extraction, bilateral      Family History  Problem Relation Age of Onset  . Heart disease Mother   . Heart disease Father   . Heart disease Brother   . Breast  cancer Daughter   . Lymphoma Brother     History  Substance Use Topics  . Smoking status: Former Smoker -- 0.50 packs/day for 50 years    Types: Cigarettes    Quit date: 05/15/1998  . Smokeless tobacco: Never Used  . Alcohol Use: No    OB History   Grav Para Term Preterm Abortions TAB SAB Ect Mult Living                  Review of Systems  Respiratory: Positive for shortness of breath.   All other systems reviewed and are negative.    Allergies  Lisinopril  Home Medications   No current outpatient prescriptions on file.  BP 138/45  Pulse 72  Temp(Src) 97.9 F (36.6 C) (Oral)  Resp 22  SpO2 98%  Physical Exam  Vitals reviewed. Constitutional: She is oriented to person, place, and time. She appears well-developed and well-nourished.  HENT:  Right Ear: External ear normal.  Left Ear: External ear normal.  Mouth/Throat: No oropharyngeal exudate.  Eyes: Conjunctivae and EOM are normal. Pupils are equal, round, and reactive to light.  Neck: Normal range of motion. Neck supple.  Cardiovascular: Normal rate, regular rhythm and intact distal pulses.  Exam reveals no gallop and no friction rub.   Murmur heard. Pulmonary/Chest: Effort normal. No respiratory distress.  BS diminished in RLB, 4L [place]  Abdominal: Soft. Bowel sounds are normal. She exhibits no distension. There is  no tenderness.  Musculoskeletal: Normal range of motion. She exhibits edema (1+).  Neurological: She is alert and oriented to person, place, and time. No cranial nerve deficit.  Skin: Skin is warm and dry. No rash noted.  Psychiatric: She has a normal mood and affect.    ED Course  Procedures (including critical care time)  Labs Reviewed  CBC WITH DIFFERENTIAL - Abnormal; Notable for the following:    RBC 2.59 (*)    Hemoglobin 7.4 (*)    HCT 23.3 (*)    RDW 15.8 (*)    Lymphs Abs 0.5 (*)    Monocytes Relative 15 (*)    Basophils Relative 2 (*)    All other components within normal  limits  BASIC METABOLIC PANEL - Abnormal; Notable for the following:    CO2 38 (*)    Glucose, Bld 148 (*)    BUN 31 (*)    Creatinine, Ser 1.28 (*)    GFR calc non Af Amer 37 (*)    GFR calc Af Amer 43 (*)    All other components within normal limits  PROTIME-INR - Abnormal; Notable for the following:    Prothrombin Time 24.1 (*)    INR 2.28 (*)    All other components within normal limits  PRO B NATRIURETIC PEPTIDE - Abnormal; Notable for the following:    Pro B Natriuretic peptide (BNP) 707.4 (*)    All other components within normal limits  OCCULT BLOOD, POC DEVICE - Abnormal; Notable for the following:    Fecal Occult Bld POSITIVE (*)    All other components within normal limits  TROPONIN I   Dg Chest 2 View  09/08/2012  *RADIOLOGY REPORT*  Clinical Data: Short of breath.  Right-sided back pain.  Irregular heart beat.  CHEST - 2 VIEW  Comparison: 09/02/2012.  Findings: Slight increase in the right pleural effusion which now layers more dependently.  Increasing left basilar subsegmental atelectasis.  Pulmonary vascular congestion.  The findings suggest volume overload/CHF.  Cardiopericardial silhouette appears similar.  IMPRESSION: Increasing right pleural effusion, now moderate.  Associated atelectasis and increasing atelectasis at the left lung base. Pulmonary vascular congestion with cephalization of blood flow suggest volume overload.   Original Report Authenticated By: Andreas Newport, M.D.     Date: 09/09/2012  Rate: 71  Rhythm: normal sinus rhythm  QRS Axis: normal  Intervals: PR prolonged and QT prolonged  ST/T Wave abnormalities: normal  Conduction Disutrbances:first-degree A-V block   Narrative Interpretation: NSR, 1st degree HB, technically poor tracing  Old EKG Reviewed: She was in afib with RVR during last EKG 06/08/12 so limited ability to compare tracings    1. SOB (shortness of breath)   2. Pleural effusion   3. Anemia   4. Acute on chronic diastolic  congestive heart failure       MDM   22 y F with PMH of diastolic CHF last EF measured was 55% on December 2013 mitral fibrillation on Coumadin, recurrent pleural effusion transudative had required thoracentesis x3 previously, aortic stenosis/mitral regurgitation and stenosis, chronic kidney disease who presents due to worsening shortness of breath.  Lying flat makes it worse.  Pt feels this episode is similar to prior effusions requiring drainage.  She also reports that she was seen by her Pulmonologist earlier this week and that she was told she would likely need admission for thoracentesis.  No fevers, chills, cough, n/v, diarrhea, abd pain or other complaints.  She has had to increase her O2  usage to 4L all the time.  AFVSS, somewhat pale.  Lung sounds decreased in RLB.  Abd soft, NT.  1+ edema.  It is felt this is likely progression of her known transudative effusion.  She has had her Lasix decreased lately due to kidney problems per the pt.  CBC, BMP, coags, EKG, CXR, bnp/trop.  9:38 PM. Hgb noted to have dropped to 7.4.  Pt denies melena/hematochezia and vaginal bleeding.  Lasix given.  Hemoccult + with dark brown stool.  Hospitalist consulted for admission.  Disposition: Admit  Condition: Stable  Pt seen in conjunction with my attending, Dr. Linwood Dibbles.  Oleh Genin, MD PGY-II [facility] Emergency Medicine Resident    Oleh Genin, MD 09/09/12 1224

## 2012-09-08 NOTE — H&P (Signed)
[facility] History and Physical  [name] WUJ:[id number] DOB: 09/12/1924 DOA: 09/08/2012  Referring physician: Dr. Beverely Pace. PCP: [name], MD  Specialists: Dr. Sherene Sires pulmonologist.                      Dr. Donnie Aho cardiologist.  Chief Complaint: Shortness of breath.  HPI: [name] is a 79 y.o. female with known history of diastolic CHF last EF measured was 55% on December 2013 mitral fibrillation on Coumadin, recurrent pleural effusion transudative had required thoracentesis previously, aortic stenosis/mitral regurgitation and stenosis, chronic kidney disease presented to the ER because of worsening shortness of breath. Patient has shortness of breath on exertion and increased presently on minimal exertion. Denies any fever chills chest pain. In the ER chest x-ray revealed increasing pleural effusion and also showed worsening hemoglobin. Patient states she has poor vision and not sure if she has been having rectal bleed. Patient has not used any NSAIDs. Stool for a blood was positive but ER physician status was not black but brown in color. Last 2 weeks patient's Lasix dose was decreased to 20 mg daily because of worsening kidney function. At this time patient was given one dose of Lasix 40 mg IV and will be admitted for further management of her CHF.  Review of Systems: As presented in the history of presenting illness, rest negative.  Past Medical History  Diagnosis Date  . Stroke   . Anemia   . Hyperlipidemia   . Vitamin D deficiency   . Hypothyroid   . Pulmonary hypertension   . Chronic respiratory failure with hypoxia   . Blindness of right eye     Macular degeneration  . Macular degeneration   . Hypertensive heart disease 04/16/2012  . CHF (congestive heart failure)   . Hypertension   . UTI (urinary tract infection)   . Arthritis     KNEES   Past Surgical History  Procedure Laterality Date  . Cataract extraction, bilateral     Social History:  reports that  she quit smoking about 14 years ago. Her smoking use included Cigarettes. She has a 25 pack-year smoking history. She has never used smokeless tobacco. She reports that she does not drink alcohol or use illicit drugs. Lives at home. where does patient live-- Not sure. Can patient participate in ADLs?  Allergies  Allergen Reactions  . Lisinopril Other (See Comments)    pancreatitis    Family History  Problem Relation Age of Onset  . Heart disease Mother   . Heart disease Father   . Heart disease Brother   . Breast cancer Daughter   . Lymphoma Brother       Prior to Admission medications   Medication Sig Start Date End Date Taking? Authorizing Provider  beta carotene w/minerals (OCUVITE) tablet Take 1 tablet by mouth 2 (two) times daily.    Yes Historical Provider, MD  cholecalciferol (VITAMIN D) 1000 UNITS tablet Take 1,000 Units by mouth every morning.    Yes Historical Provider, MD  diltiazem (CARDIZEM CD) 180 MG 24 hr capsule Take 180 mg by mouth every morning.  06/03/12  Yes Estela Isaiah Blakes, MD  Ferrous Sulfate (IRON) 325 (65 FE) MG TABS Take 1 tablet by mouth at bedtime.    Yes Historical Provider, MD  folic acid (FOLVITE) 1 MG tablet Take 1 mg by mouth at bedtime.    Yes Historical Provider, MD  furosemide (LASIX) 20 MG tablet Take 20 mg by  mouth every morning.    Yes Historical Provider, MD  glucosamine-chondroitin 500-400 MG tablet Take 1 tablet by mouth 2 (two) times daily.    Yes Historical Provider, MD  levothyroxine (SYNTHROID, LEVOTHROID) 137 MCG tablet Take 137 mcg by mouth daily before breakfast.   Yes Historical Provider, MD  Multiple Vitamin (MULITIVITAMIN WITH MINERALS) TABS Take 1 tablet by mouth every morning.    Yes Historical Provider, MD  omeprazole (PRILOSEC) 40 MG capsule Take 40 mg by mouth every morning.    Yes Historical Provider, MD  simvastatin (ZOCOR) 10 MG tablet Take 10 mg by mouth at bedtime.   Yes Historical Provider, MD  traMADol (ULTRAM) 50  MG tablet Take 50 mg by mouth 2 (two) times daily.    Yes Historical Provider, MD  warfarin (COUMADIN) 5 MG tablet Take 2.5-5 mg by mouth See admin instructions. On Saturday take 2.5mg  & take 5mg  on all other days 06/03/12  Yes Estela Isaiah Blakes, MD   Physical Exam: Filed Vitals:   09/08/12 1945 09/08/12 2000 09/08/12 2015 09/08/12 2030  BP: 144/56 142/55 143/57 149/61  Pulse: 69 66 70 73  Temp:      TempSrc:      Resp: 28 32 24 22  SpO2: 97% 98% 97% 97%     General:  Well-developed well-nourished.  Eyes: Poor vision in both eyes.  ENT: No discharge from the ears eyes nose or mouth.  Neck: No mass felt.  Cardiovascular: S1-S2 heard.  Respiratory: No rhonchi no crepitations.  Abdomen: Soft nontender bowel sounds present.  Skin: No rash.  Musculoskeletal: Bilateral lower extremity.  Psychiatric: Appears normal.  Neurologic: Poor vision in both eyes. Moves all extremities.  Labs on Admission:  Basic Metabolic Panel:  Recent Labs Lab 09/02/12 1239 09/08/12 1823  NA 136 141  K 4.6 4.0  CL 92* 98  CO2 36* 38*  GLUCOSE 110* 148*  BUN 32* 31*  CREATININE 1.6* 1.28*  CALCIUM 9.5 9.5   Liver Function Tests: No results found for this basename: AST, ALT, ALKPHOS, BILITOT, PROT, ALBUMIN,  in the last 168 hours No results found for this basename: LIPASE, AMYLASE,  in the last 168 hours No results found for this basename: AMMONIA,  in the last 168 hours CBC:  Recent Labs Lab 09/02/12 1239 09/08/12 1823  WBC 5.4 4.3  NEUTROABS 3.8 2.9  HGB 9.0* 7.4*  HCT 27.6* 23.3*  MCV 89.3 90.0  PLT 288.0 292   Cardiac Enzymes:  Recent Labs Lab 09/08/12 1823  TROPONINI <0.30    BNP (last 3 results)  Recent Labs  07/17/12 0957 09/02/12 1239 09/08/12 1823  PROBNP 142.0* 168.0* 707.4*   CBG: No results found for this basename: GLUCAP,  in the last 168 hours  Radiological Exams on Admission: Dg Chest 2 View  09/08/2012  *RADIOLOGY REPORT*  Clinical  Data: Short of breath.  Right-sided back pain.  Irregular heart beat.  CHEST - 2 VIEW  Comparison: 09/02/2012.  Findings: Slight increase in the right pleural effusion which now layers more dependently.  Increasing left basilar subsegmental atelectasis.  Pulmonary vascular congestion.  The findings suggest volume overload/CHF.  Cardiopericardial silhouette appears similar.  IMPRESSION: Increasing right pleural effusion, now moderate.  Associated atelectasis and increasing atelectasis at the left lung base. Pulmonary vascular congestion with cephalization of blood flow suggest volume overload.   Original Report Authenticated By: Andreas Newport, M.D.     EKG: Independently reviewed. Sinus rhythm.  Assessment/Plan Principal Problem:   SOB (  shortness of breath) Active Problems:   Hypothyroidism   Pleural effusion   Acute on chronic diastolic congestive heart failure   Anemia   1. CHF with history of diastolic CHF last EF measured 16% on December 2013 - at this time patient was given one dose of IV Lasix and we will continue with by mouth Lasix home dose. Patient CHF could have also worsened because of anemia. Given the pleural effusion and patient's shortness of breath patient probably may need thoracentesis for which I have consulted pulmonary. I have ordered a repeat CBC stat and if there is any further worsening of hemoglobin may need transfusion. For now we will hold Coumadin and place patient on heparin in anticipation of procedures or if patient requires Coumadin reversal. 2. Anemia stool occult blood positive - closely follow CBC. See #1. Protonix IV. 3. A. fib presently rate controlled - patient is on Cardizem we'll continue that and is off amiodarone. See #1 with regarding to anticoagulation. 4. Hypothyroidism - continue Synthroid. Check TSH. 5. Chronic kidney disease - closely follow intake output and metabolic panel with daily weights. 6. History of CVA.    Code Status: Full code.   Family Communication: Patient's daughter at the bedside.  Disposition Plan: Admit to inpatient.    Tyree Fluharty N. [facility] Pager [phone].  If 7PM-7AM, please contact night-coverage [email or web address] Password [facility] 09/08/2012, 9:03 PM

## 2012-09-08 NOTE — ED Notes (Signed)
Admitting MD into room, at BS 

## 2012-09-08 NOTE — ED Notes (Addendum)
Family [name]) called at home [phone]), updated on room assignment and unit contact number.

## 2012-09-08 NOTE — ED Notes (Signed)
Patient states that she has pain in her back, it spasms when she sits too long and she doesn't move for long periods of time.  Patient medicated per orders.

## 2012-09-08 NOTE — ED Provider Notes (Signed)
I reviewed and interpreted the EKG during the patient's evaluation in the ED and agree with the resident's interpretation. I saw and evaluated the patient, reviewed the resident's note and I agree with the findings and plan.  History of recurrent pleural effusions requiring thoracentesis. The patient feels like she has been getting progressively short of breath over the last week. Patient had seen her Dr. and noted that the infusion was increasing.  The patient was seen just 6 days ago. Patient is on Coumadin however and she was not a candidate for thoracentesis at that time. We will repeat labs x-rays and reassess.  Celene Kras, MD 09/09/12 [phone]

## 2012-09-09 DIAGNOSIS — J9610 Chronic respiratory failure, unspecified whether with hypoxia or hypercapnia: Secondary | ICD-10-CM

## 2012-09-09 DIAGNOSIS — J90 Pleural effusion, not elsewhere classified: Secondary | ICD-10-CM

## 2012-09-09 DIAGNOSIS — G9340 Encephalopathy, unspecified: Secondary | ICD-10-CM

## 2012-09-09 DIAGNOSIS — R0902 Hypoxemia: Secondary | ICD-10-CM

## 2012-09-09 DIAGNOSIS — I5030 Unspecified diastolic (congestive) heart failure: Secondary | ICD-10-CM

## 2012-09-09 LAB — COMPREHENSIVE METABOLIC PANEL
Albumin: 2.9 g/dL — ABNORMAL LOW (ref 3.5–5.2)
Alkaline Phosphatase: 93 U/L (ref 39–117)
BUN: 29 mg/dL — ABNORMAL HIGH (ref 6–23)
CO2: 39 mEq/L — ABNORMAL HIGH (ref 19–32)
Chloride: 97 mEq/L (ref 96–112)
Creatinine, Ser: 1.33 mg/dL — ABNORMAL HIGH (ref 0.50–1.10)
GFR calc Af Amer: 41 mL/min — ABNORMAL LOW (ref >90)
GFR calc non Af Amer: 35 mL/min — ABNORMAL LOW (ref >90)
Glucose, Bld: 64 mg/dL — ABNORMAL LOW (ref 70–99)
Potassium: 3.6 mEq/L (ref 3.5–5.1)
Total Bilirubin: 0.2 mg/dL — ABNORMAL LOW (ref 0.3–1.2)

## 2012-09-09 LAB — CBC WITH DIFFERENTIAL/PLATELET
Basophils Absolute: 0 10*3/uL (ref 0.0–0.1)
Lymphocytes Relative: 16 % (ref 12–46)
Lymphs Abs: 0.8 10*3/uL (ref 0.7–4.0)
Neutro Abs: 3 10*3/uL (ref 1.7–7.7)
Neutrophils Relative %: 62 % (ref 43–77)
Platelets: 306 10*3/uL (ref 150–400)
RBC: 2.62 MIL/uL — ABNORMAL LOW (ref 3.87–5.11)
RDW: 16.1 % — ABNORMAL HIGH (ref 11.5–15.5)
WBC: 4.9 10*3/uL (ref 4.0–10.5)

## 2012-09-09 LAB — VITAMIN B12: Vitamin B-12: 1289 pg/mL — ABNORMAL HIGH (ref 211–911)

## 2012-09-09 LAB — CBC
HCT: 23.2 % — ABNORMAL LOW (ref 36.0–46.0)
MCV: 89.6 fL (ref 78.0–100.0)
Platelets: 278 10*3/uL (ref 150–400)
RBC: 2.59 MIL/uL — ABNORMAL LOW (ref 3.87–5.11)
RDW: 15.9 % — ABNORMAL HIGH (ref 11.5–15.5)
WBC: 5.5 10*3/uL (ref 4.0–10.5)

## 2012-09-09 LAB — IRON AND TIBC
Iron: 35 ug/dL — ABNORMAL LOW (ref 42–135)
Saturation Ratios: 9 % — ABNORMAL LOW (ref 20–55)
TIBC: 376 ug/dL (ref 250–470)

## 2012-09-09 LAB — PROTIME-INR: Prothrombin Time: 23.1 seconds — ABNORMAL HIGH (ref 11.6–15.2)

## 2012-09-09 LAB — TSH: TSH: 11.472 u[IU]/mL — ABNORMAL HIGH (ref 0.350–4.500)

## 2012-09-09 LAB — LACTATE DEHYDROGENASE: LDH: 187 U/L (ref 94–250)

## 2012-09-09 LAB — RETICULOCYTES: Retic Ct Pct: 3.1 % (ref 0.4–3.1)

## 2012-09-09 MED ORDER — FUROSEMIDE 80 MG PO TABS
80.0000 mg | ORAL_TABLET | Freq: Every morning | ORAL | Status: DC
  Filled 2012-09-09: qty 1

## 2012-09-09 MED ORDER — LEVOTHYROXINE SODIUM 150 MCG PO TABS
150.0000 ug | ORAL_TABLET | Freq: Every day | ORAL | Status: DC
  Administered 2012-09-10 – 2012-09-17 (×8): 150 ug via ORAL
  Filled 2012-09-09 (×10): qty 1

## 2012-09-09 MED ORDER — PANTOPRAZOLE SODIUM 40 MG IV SOLR
40.0000 mg | Freq: Two times a day (BID) | INTRAVENOUS | Status: DC
  Administered 2012-09-09: 40 mg via INTRAVENOUS
  Filled 2012-09-09 (×3): qty 40

## 2012-09-09 MED ORDER — PANTOPRAZOLE SODIUM 40 MG PO TBEC
40.0000 mg | DELAYED_RELEASE_TABLET | Freq: Every day | ORAL | Status: DC
  Administered 2012-09-09 – 2012-09-17 (×9): 40 mg via ORAL
  Filled 2012-09-09 (×9): qty 1

## 2012-09-09 NOTE — Progress Notes (Signed)
PULMONARY  / CRITICAL CARE MEDICINE  Name: [name] MRN: [id number] DOB: November 12, 1924    ADMISSION DATE:  09/08/2012 CONSULTATION DATE:  09/08/2012   REFERRING MD :  Lonia Blood  CHIEF COMPLAINT:  Dyspnea  BRIEF PATIENT DESCRIPTION:  79 yo female former smoker admitted with progressive dyspnea and cough.  She has hx of recurrent right pleural effusion s/p multiple thoracentesis procedures.  PCCM consulted to assess dyspnea, and recurrent right pleural effusion first noted October 2013.  She is followed by Dr. Sherene Sires in office.  STUDIES:  02/21/12 Spirometry >> FEV1 0.67 (41%), FEV1% 64 04/16/12 Echo >> mod LVH, EF 55%, mild AR, mod MR, mod LA dilation, PAS 44 mmHg 04/18/12 Rt thoracentesis >> protein 2.5, LDH 76, WBC 575 (1N, 80L, 109M), small lymphocytes on cytology 05/31/12 Rt thoracentesis >> protein 2.9, LDH 101, WBC 465 (1N, 80L, 73M, 1E), small lymphocytes on cytology >> flow cytometry negative  SUBJECTIVE:  C/o dyspnea with minimal exertion.  She felt better after last thoracentesis.  She is having cough with sputum.  She denies chest pain.  VITAL SIGNS: Temp:  [97 F (36.1 C)-97.9 F (36.6 C)] 97.2 F (36.2 C) (04/28 1413) Pulse Rate:  [66-86] 75 (04/28 1413) Resp:  [14-32] 18 (04/28 1413) BP: (127-160)/(45-122) 139/58 mmHg (04/28 1413) SpO2:  [88 %-100 %] 98 % (04/28 1413) FiO2 (%):  [100 %] 100 % (04/27 2119) Weight:  [163 lb 6.4 oz (74.118 kg)-169 lb 15.6 oz (77.1 kg)] 163 lb 6.4 oz (74.118 kg) (04/28 0514) 3 liters   PHYSICAL EXAMINATION: General: No distress Neuro: Alert, follows commands HEENT:  No sinus tenderness Cardiovascular:  Irregular, 2/6 SM  Lungs: decreased BS Rt base, no wheeze Abdomen: soft, non tender Musculoskeletal: no edema Skin: no rashes   Recent Labs Lab 09/08/12 1823 09/09/12 0630  NA 141 142  K 4.0 3.6  CL 98 97  CO2 38* 39*  BUN 31* 29*  CREATININE 1.28* 1.33*  GLUCOSE 148* 64*    Recent Labs Lab 09/08/12 1823  09/09/12 0040 09/09/12 0630  HGB 7.4* 7.5* 7.5*  HCT 23.3* 23.2* 23.7*  WBC 4.3 5.5 4.9  PLT 292 278 306   Dg Chest 2 View  09/08/2012  *RADIOLOGY REPORT*  Clinical Data: Short of breath.  Right-sided back pain.  Irregular heart beat.  CHEST - 2 VIEW  Comparison: 09/02/2012.  Findings: Slight increase in the right pleural effusion which now layers more dependently.  Increasing left basilar subsegmental atelectasis.  Pulmonary vascular congestion.  The findings suggest volume overload/CHF.  Cardiopericardial silhouette appears similar.  IMPRESSION: Increasing right pleural effusion, now moderate.  Associated atelectasis and increasing atelectasis at the left lung base. Pulmonary vascular congestion with cephalization of blood flow suggest volume overload.   Original Report Authenticated By: Andreas Newport, M.D.     Lab Results  Component Value Date   INR 2.15* 09/09/2012   INR 2.28* 09/08/2012   INR 1.58* 06/10/2012    ASSESSMENT / PLAN:  Progressive dyspnea with recurrent Rt pleural effusion.   Previous fluid analysis has shown benign, transudate.  Has significant cardiac disease, but no improvement with diuresis.  No hx of renal or liver disease. Plan: -repeat thoracentesis when INR < 2 -will send fluid for repeat analysis -may be beneficial to consider pleur-x catheter for symptomatic management of effusion  Former smoker with hx of COPD. Plan: -PRN bronchodilators  Acute on chronic hypoxia. Plan: -adjust oxygen to keep SpO2 > 92%  Hx of A fib, valvular  heart disease. Plan: -per primary team  Anemia. Plan: -per primary team and GI  Coralyn Helling, MD [facility] Pulmonary/Critical Care 09/09/2012, 3:06 PM Pager:  [phone] After 3pm call: [phone]

## 2012-09-09 NOTE — Progress Notes (Signed)
Utilization Review Completed.   Kaari Zeigler, RN, BSN Nurse Case Manager  [phone]  

## 2012-09-09 NOTE — Consult Note (Signed)
[facility] CONSULT Reason for consult: Hemoccult positive stool and anemia Referring Physician: [facility]. PCP: Dr. Lorelle Formosa is an 79 y.o. female.  HPI: 79 year old who was admitted with worsening congestive heart failure and anemia. Her stools in the emergency room or brown but he MacColl positive. Hemoglobin 7.4 on admission and had been higher in the past. She apparently has had a long history of iron deficiency anemia. She has been taking oral iron and has been followed by Dr. [name]. She has fairly significant renal insufficiency, hypothyroidism, congestive heart failure. She denies abdominal pain, dyspepsia, or visible on stool. She denies melena. Her vision is poor and she notes that she may have had bleeding from her rectum and missed. She has been on nonsteroidal past. She has had her current pleural effusion's that are transudate and have required thoracentesis in the past. She has not been using any NSAIDs recently. The patient has been seen in the past for anemia by Dr. Matthias Hughs. She had EGD colonoscopy 8/09. This reveal hiatal hernia esophageal ring. EGD was otherwise negative. Colonoscopy revealed diverticulosis in the sigmoid colon once on meter adenomatous polyp in the rectum.  Past Medical History  Diagnosis Date  . Stroke   . Anemia   . Hyperlipidemia   . Vitamin D deficiency   . Hypothyroid   . Pulmonary hypertension   . Chronic respiratory failure with hypoxia   . Blindness of right eye     Macular degeneration  . Macular degeneration   . Hypertensive heart disease 04/16/2012  . CHF (congestive heart failure)   . Hypertension   . UTI (urinary tract infection)   . Arthritis     KNEES    Past Surgical History  Procedure Laterality Date  . Cataract extraction, bilateral      Family History  Problem Relation Age of Onset  . Heart disease Mother   . Heart disease Father   . Heart disease Brother   . Breast cancer Daughter   . Lymphoma  Brother     Social History:  reports that she quit smoking about 14 years ago. Her smoking use included Cigarettes. She has a 25 pack-year smoking history. She has never used smokeless tobacco. She reports that she does not drink alcohol or use illicit drugs.  Allergies:  Allergies  Allergen Reactions  . Lisinopril Other (See Comments)    pancreatitis    Medications; . beta carotene w/minerals  1 tablet Oral BID  . cholecalciferol  1,000 Units Oral q morning - 10a  . diltiazem  180 mg Oral q morning - 10a  . ferrous sulfate  325 mg Oral QHS  . folic acid  1 mg Oral QHS  . furosemide  20 mg Oral q morning - 10a  . [START ON 09/10/2012] levothyroxine  150 mcg Oral QAC breakfast  . multivitamin with minerals  1 tablet Oral q morning - 10a  . pantoprazole  40 mg Oral Q1200  . simvastatin  10 mg Oral QHS  . sodium chloride  3 mL Intravenous Q12H  . sodium chloride  3 mL Intravenous Q12H  . traMADol  50 mg Oral BID   PRN Meds acetaminophen, acetaminophen, albuterol, ondansetron (ZOFRAN) IV, ondansetron Results for orders placed during the hospital encounter of 09/08/12 (from the past 48 hour(s))  CBC WITH DIFFERENTIAL     Status: Abnormal   Collection Time    09/08/12  6:23 PM      Result Value Range  WBC 4.3  4.0 - 10.5 K/uL   RBC 2.59 (*) 3.87 - 5.11 MIL/uL   Hemoglobin 7.4 (*) 12.0 - 15.0 g/dL   HCT 16.1 (*) 09.6 - 04.5 %   MCV 90.0  78.0 - 100.0 fL   MCH 28.6  26.0 - 34.0 pg   MCHC 31.8  30.0 - 36.0 g/dL   RDW 40.9 (*) 81.1 - 91.4 %   Platelets 292  150 - 400 K/uL   Neutrophils Relative 67  43 - 77 %   Neutro Abs 2.9  1.7 - 7.7 K/uL   Lymphocytes Relative 12  12 - 46 %   Lymphs Abs 0.5 (*) 0.7 - 4.0 K/uL   Monocytes Relative 15 (*) 3 - 12 %   Monocytes Absolute 0.7  0.1 - 1.0 K/uL   Eosinophils Relative 5  0 - 5 %   Eosinophils Absolute 0.2  0.0 - 0.7 K/uL   Basophils Relative 2 (*) 0 - 1 %   Basophils Absolute 0.1  0.0 - 0.1 K/uL  BASIC METABOLIC PANEL     Status:  Abnormal   Collection Time    09/08/12  6:23 PM      Result Value Range   Sodium 141  135 - 145 mEq/L   Potassium 4.0  3.5 - 5.1 mEq/L   Chloride 98  96 - 112 mEq/L   CO2 38 (*) 19 - 32 mEq/L   Glucose, Bld 148 (*) 70 - 99 mg/dL   BUN 31 (*) 6 - 23 mg/dL   Creatinine, Ser 7.82 (*) 0.50 - 1.10 mg/dL   Calcium 9.5  8.4 - 95.6 mg/dL   GFR calc non Af Amer 37 (*) >90 mL/min   GFR calc Af Amer 43 (*) >90 mL/min   Comment:            The eGFR has been calculated     using the CKD EPI equation.     This calculation has not been     validated in all clinical     situations.     eGFR's persistently     <90 mL/min signify     possible Chronic Kidney Disease.  PROTIME-INR     Status: Abnormal   Collection Time    09/08/12  6:23 PM      Result Value Range   Prothrombin Time 24.1 (*) 11.6 - 15.2 seconds   INR 2.28 (*) 0.00 - 1.49  PRO B NATRIURETIC PEPTIDE     Status: Abnormal   Collection Time    09/08/12  6:23 PM      Result Value Range   Pro B Natriuretic peptide (BNP) 707.4 (*) 0 - 450 pg/mL  TROPONIN I     Status: None   Collection Time    09/08/12  6:23 PM      Result Value Range   Troponin I <0.30  <0.30 ng/mL   Comment:            Due to the release kinetics of cTnI,     a negative result within the first hours     of the onset of symptoms does not rule out     myocardial infarction with certainty.     If myocardial infarction is still suspected,     repeat the test at appropriate intervals.  OCCULT BLOOD, POC DEVICE     Status: Abnormal   Collection Time    09/08/12  9:15 PM      Result  Value Range   Fecal Occult Bld POSITIVE (*) NEGATIVE  CBC     Status: Abnormal   Collection Time    09/09/12 12:40 AM      Result Value Range   WBC 5.5  4.0 - 10.5 K/uL   RBC 2.59 (*) 3.87 - 5.11 MIL/uL   Hemoglobin 7.5 (*) 12.0 - 15.0 g/dL   HCT 16.1 (*) 09.6 - 04.5 %   MCV 89.6  78.0 - 100.0 fL   MCH 29.0  26.0 - 34.0 pg   MCHC 32.3  30.0 - 36.0 g/dL   RDW 40.9 (*) 81.1 -  15.5 %   Platelets 278  150 - 400 K/uL  TYPE AND SCREEN     Status: None   Collection Time    09/09/12 12:42 AM      Result Value Range   ABO/RH(D) AB POS     Antibody Screen NEG     Sample Expiration 09/12/2012    TROPONIN I     Status: None   Collection Time    09/09/12 12:42 AM      Result Value Range   Troponin I <0.30  <0.30 ng/mL   Comment:            Due to the release kinetics of cTnI,     a negative result within the first hours     of the onset of symptoms does not rule out     myocardial infarction with certainty.     If myocardial infarction is still suspected,     repeat the test at appropriate intervals.  ABO/RH     Status: None   Collection Time    09/09/12 12:42 AM      Result Value Range   ABO/RH(D) AB POS    PROTIME-INR     Status: Abnormal   Collection Time    09/09/12  6:30 AM      Result Value Range   Prothrombin Time 23.1 (*) 11.6 - 15.2 seconds   INR 2.15 (*) 0.00 - 1.49  COMPREHENSIVE METABOLIC PANEL     Status: Abnormal   Collection Time    09/09/12  6:30 AM      Result Value Range   Sodium 142  135 - 145 mEq/L   Potassium 3.6  3.5 - 5.1 mEq/L   Chloride 97  96 - 112 mEq/L   CO2 39 (*) 19 - 32 mEq/L   Glucose, Bld 64 (*) 70 - 99 mg/dL   BUN 29 (*) 6 - 23 mg/dL   Creatinine, Ser 9.14 (*) 0.50 - 1.10 mg/dL   Calcium 9.3  8.4 - 78.2 mg/dL   Total Protein 6.9  6.0 - 8.3 g/dL   Albumin 2.9 (*) 3.5 - 5.2 g/dL   AST 24  0 - 37 U/L   ALT 11  0 - 35 U/L   Alkaline Phosphatase 93  39 - 117 U/L   Total Bilirubin 0.2 (*) 0.3 - 1.2 mg/dL   GFR calc non Af Amer 35 (*) >90 mL/min   GFR calc Af Amer 41 (*) >90 mL/min   Comment:            The eGFR has been calculated     using the CKD EPI equation.     This calculation has not been     validated in all clinical     situations.     eGFR's persistently     <90 mL/min signify  possible Chronic Kidney Disease.  CBC WITH DIFFERENTIAL     Status: Abnormal   Collection Time    09/09/12  6:30 AM       Result Value Range   WBC 4.9  4.0 - 10.5 K/uL   RBC 2.62 (*) 3.87 - 5.11 MIL/uL   Hemoglobin 7.5 (*) 12.0 - 15.0 g/dL   HCT 16.1 (*) 09.6 - 04.5 %   MCV 90.5  78.0 - 100.0 fL   MCH 28.6  26.0 - 34.0 pg   MCHC 31.6  30.0 - 36.0 g/dL   RDW 40.9 (*) 81.1 - 91.4 %   Platelets 306  150 - 400 K/uL   Neutrophils Relative 62  43 - 77 %   Neutro Abs 3.0  1.7 - 7.7 K/uL   Lymphocytes Relative 16  12 - 46 %   Lymphs Abs 0.8  0.7 - 4.0 K/uL   Monocytes Relative 15 (*) 3 - 12 %   Monocytes Absolute 0.7  0.1 - 1.0 K/uL   Eosinophils Relative 6 (*) 0 - 5 %   Eosinophils Absolute 0.3  0.0 - 0.7 K/uL   Basophils Relative 1  0 - 1 %   Basophils Absolute 0.0  0.0 - 0.1 K/uL  TSH     Status: Abnormal   Collection Time    09/09/12  6:30 AM      Result Value Range   TSH 11.472 (*) 0.350 - 4.500 uIU/mL    Dg Chest 2 View  09/08/2012  *RADIOLOGY REPORT*  Clinical Data: Short of breath.  Right-sided back pain.  Irregular heart beat.  CHEST - 2 VIEW  Comparison: 09/02/2012.  Findings: Slight increase in the right pleural effusion which now layers more dependently.  Increasing left basilar subsegmental atelectasis.  Pulmonary vascular congestion.  The findings suggest volume overload/CHF.  Cardiopericardial silhouette appears similar.  IMPRESSION: Increasing right pleural effusion, now moderate.  Associated atelectasis and increasing atelectasis at the left lung base. Pulmonary vascular congestion with cephalization of blood flow suggest volume overload.   Original Report Authenticated By: Andreas Newport, M.D.                Blood pressure 127/48, pulse 82, temperature 97.6 F (36.4 C), temperature source Oral, resp. rate 20, height 5\' 5"  (1.651 m), weight 74.118 kg (163 lb 6.4 oz), SpO2 97.00%.  Physical exam:   General-- alert and oriented white female in no distress Heart-- regular rate and rhythm without murmurs are gallops Lungs--clear with dull breath sounds and lower lungs Abdomen--  soft and nontender   Assessment: 1. Iron deficiency anemia/trace heme positive stool. This could all be multifactorial. She certainly is not having any ongoing G.I. bleeding. She definitely could benefit from the iron. Colonoscopy in EGD were essentially nonmarketable about 5 years ago. At this point I would defer further endoscopic evaluation until her congestive heart failure has improved.  2. Congestive heart failure 3. Chronic renal insufficiency 4. Pulmonary hypertension  Plan: we will follow her along with you. I would go ahead and continue her empirically a PPI therapy. I would consider giving her IV iron in order to replenish her iron stores   Donat Humble JR,Damian Hofstra L 09/09/2012, 1:54 PM

## 2012-09-09 NOTE — Progress Notes (Signed)
[facility] PROGRESS NOTE  [name] ZOX:[id number] DOB: 1925/03/26 DOA: 09/08/2012 PCP: [name], MD  [name] is a 79 y.o. female with known history of diastolic CHF last EF measured was 55% on December 2013, atrial  fibrillation on Coumadin, recurrent pleural effusion transudative had required thoracentesis previously, aortic stenosis/mitral regurgitation and stenosis, chronic kidney disease presented to the ER because of worsening shortness of breath. Patient has shortness of breath on exertion and increased presently on minimal exertion. Denies any fever chills chest pain. In the ER chest x-ray revealed increasing pleural effusion and also showed drop in hemoglobin. Patient states she has poor vision and not sure if she has been having rectal bleed. Patient has not used any NSAIDs. Stool for a blood was positive but ER physician status was not black but brown in color. Last 2 weeks patient's Lasix dose was decreased to 20 mg daily because of worsening kidney function. At this time patient was given one dose of Lasix 40 mg IV and will be admitted for further management of her CHF, pleural effusion, dyspnea.     Assessment/Plan: 1. CHF with history of diastolic CHF, mitral stenosis and mitral regurgitation  last EF measured 55% on December 2013 - continue diuretics.   Patient's dyspnea could have also worsened because of anemia.   Recurrent transudative pleural effusions (first mentioned in 2010 on a CT scan of the chest)  Last time fluid was sent for analysis 05/2012 it was conclusive transudate  - patient has diastolic dysfunction, pulm HTN, low albumin, CKD and hypothyroidism  - suspect these factors are all combining to cause the reaccumulation of pleural fluid immediately after each thoracentesis.    2. Anemia with stool occult blood positive - closely follow CBC. Patient was started on  Protonix IV and received one dose on 4/27 - we then have changed her to po Cardizem. .  She has been started on Coumadin during admission in 05/2012. Prior to that admission hemoglobin was around 10.5 (noted in 04/2012). She seems to have had a slow but steady drop for the past few months - currently she is at 7.5 and holding steady. Has not had a workup for anemia since 2010 - will order . Will also consult Dr. Randa Evens with [facility] . Suspect her anemia is combination of CKD, thyroid ds, chronic GI loss.   A. fib presently rate controlled - patient is on Cardizem we'll continue that. She is now off amiodarone. Coumadin on hold for now.    Hypothyroidism -   TSH revealing uncontrolled hypothyroidism TSH 11.472.  Synthroid increased from 137 mcg to 150 mcg daily (maybe she decompensated due to recent amiodarone use).   Chronic kidney disease - closely follow intake output and metabolic panel with daily weights.    History of CVA   Code Status: full Family Communication:patient  Disposition Plan: home   Consultants:  Dr. Sondra Come   PCCM  Dr. [facility]   Procedures:    Antibiotics:  HPI/Subjective: C/o DOE  Objective: Filed Vitals:   09/08/12 2220 09/09/12 0100 09/09/12 0514 09/09/12 1002  BP: 142/70 139/57 140/78 127/48  Pulse: 80 77 86 82  Temp: 97 F (36.1 C) 97.2 F (36.2 C) 97.6 F (36.4 C)   TempSrc:  Oral Oral   Resp:  20 20   Height: 5\' 5"  (1.651 m)     Weight: 75.388 kg (166 lb 3.2 oz)  74.118 kg (163 lb 6.4 oz)   SpO2: 99% 97% 97%  Patient Vitals for the past 24 hrs:  BP Temp Temp src Pulse Resp SpO2 Height Weight  09/09/12 1002 127/48 mmHg - - 82 - - - -  09/09/12 0514 140/78 mmHg 97.6 F (36.4 C) Oral 86 20 97 % - 74.118 kg (163 lb 6.4 oz)  09/09/12 0100 139/57 mmHg 97.2 F (36.2 C) Oral 77 20 97 % - -  09/08/12 2220 142/70 mmHg 97 F (36.1 C) - 80 - 99 % 5\' 5"  (1.651 m) 75.388 kg (166 lb 3.2 oz)  09/08/12 2201 - - - - - - 5' 4.5" (1.638 m) 77.1 kg (169 lb 15.6 oz)  09/08/12 2145 148/54 mmHg - - 80 20 97 % - -  09/08/12 2130 151/61  mmHg - - 80 16 99 % - -  09/08/12 2119 - - - - - 97 % - -  09/08/12 2115 160/122 mmHg - - 84 24 89 % - -  09/08/12 2100 154/57 mmHg - - 79 21 88 % - -  09/08/12 2045 142/51 mmHg - - 74 22 97 % - -  09/08/12 2030 149/61 mmHg - - 73 22 97 % - -  09/08/12 2015 143/57 mmHg - - 70 24 97 % - -  09/08/12 2000 142/55 mmHg - - 66 32 98 % - -  09/08/12 1945 144/56 mmHg - - 69 28 97 % - -  09/08/12 1943 - - - 70 20 98 % - -  09/08/12 1942 133/69 mmHg - - - - - - -  09/08/12 1900 147/56 mmHg - - 72 19 100 % - -  09/08/12 1845 145/56 mmHg - - 72 24 99 % - -  09/08/12 1830 143/54 mmHg - - 70 21 99 % - -  09/08/12 1815 135/52 mmHg - - 69 15 100 % - -  09/08/12 1809 137/49 mmHg 97.3 F (36.3 C) Oral - 20 99 % - -  09/08/12 1800 140/54 mmHg - - 70 18 100 % - -  09/08/12 1745 133/56 mmHg - - 71 17 100 % - -  09/08/12 1730 141/53 mmHg - - 69 14 97 % - -  09/08/12 1717 138/45 mmHg 97.9 F (36.6 C) Oral 72 22 98 % - -     Intake/Output Summary (Last 24 hours) at 09/09/12 1321 Last data filed at 09/09/12 1243  Gross per 24 hour  Intake    463 ml  Output   1650 ml  Net  -1187 ml   Filed Weights   09/08/12 2201 09/08/12 2220 09/09/12 0514  Weight: 77.1 kg (169 lb 15.6 oz) 75.388 kg (166 lb 3.2 oz) 74.118 kg (163 lb 6.4 oz)    Exam:   General:  axox3  Cardiovascular: irreg irreg 3/6 systolic murmur   Respiratory: decreased sounds at bases   Abdomen: soft  Musculoskeletal: peripheral edema    Data Reviewed: Basic Metabolic Panel:  Recent Labs Lab 09/08/12 1823 09/09/12 0630  NA 141 142  K 4.0 3.6  CL 98 97  CO2 38* 39*  GLUCOSE 148* 64*  BUN 31* 29*  CREATININE 1.28* 1.33*  CALCIUM 9.5 9.3   Liver Function Tests:  Recent Labs Lab 09/09/12 0630  AST 24  ALT 11  ALKPHOS 93  BILITOT 0.2*  PROT 6.9  ALBUMIN 2.9*   No results found for this basename: LIPASE, AMYLASE,  in the last 168 hours No results found for this basename: AMMONIA,  in the last 168  hours CBC:  Recent  Labs Lab 09/08/12 1823 09/09/12 0040 09/09/12 0630  WBC 4.3 5.5 4.9  NEUTROABS 2.9  --  3.0  HGB 7.4* 7.5* 7.5*  HCT 23.3* 23.2* 23.7*  MCV 90.0 89.6 90.5  PLT 292 278 306   Cardiac Enzymes:  Recent Labs Lab 09/08/12 1823 09/09/12 0042  TROPONINI <0.30 <0.30   BNP (last 3 results)  Recent Labs  07/17/12 0957 09/02/12 1239 09/08/12 1823  PROBNP 142.0* 168.0* 707.4*   CBG: No results found for this basename: GLUCAP,  in the last 168 hours  No results found for this or any previous visit (from the past 240 hour(s)).   Studies: Dg Chest 2 View  09/08/2012  *RADIOLOGY REPORT*  Clinical Data: Short of breath.  Right-sided back pain.  Irregular heart beat.  CHEST - 2 VIEW  Comparison: 09/02/2012.  Findings: Slight increase in the right pleural effusion which now layers more dependently.  Increasing left basilar subsegmental atelectasis.  Pulmonary vascular congestion.  The findings suggest volume overload/CHF.  Cardiopericardial silhouette appears similar.  IMPRESSION: Increasing right pleural effusion, now moderate.  Associated atelectasis and increasing atelectasis at the left lung base. Pulmonary vascular congestion with cephalization of blood flow suggest volume overload.   Original Report Authenticated By: Andreas Newport, M.D.     Scheduled Meds: . beta carotene w/minerals  1 tablet Oral BID  . cholecalciferol  1,000 Units Oral q morning - 10a  . diltiazem  180 mg Oral q morning - 10a  . ferrous sulfate  325 mg Oral QHS  . folic acid  1 mg Oral QHS  . furosemide  20 mg Oral q morning - 10a  . [START ON 09/10/2012] levothyroxine  150 mcg Oral QAC breakfast  . multivitamin with minerals  1 tablet Oral q morning - 10a  . pantoprazole  40 mg Oral Q1200  . simvastatin  10 mg Oral QHS  . sodium chloride  3 mL Intravenous Q12H  . sodium chloride  3 mL Intravenous Q12H  . traMADol  50 mg Oral BID   Continuous Infusions:   Principal Problem:   SOB  (shortness of breath) Active Problems:   Dyspnea   Hypothyroidism   Pleural effusion   Chronic respiratory failure with hypoxia   Pulmonary hypertension   Acute on chronic diastolic congestive heart failure   CKD (chronic kidney disease) stage 3, GFR 30-59 ml/min   Atrial fibrillation   Anemia        Donte Kary  [facility] Pager [phone]. If 7PM-7AM, please contact night-coverage at [email or web address], password [facility] 09/09/2012, 1:21 PM  LOS: 1 day

## 2012-09-09 NOTE — Consult Note (Signed)
Cardiology Consult Note  Admit date: 09/08/2012 Name: [name] 79 y.o.  female DOB:  1924/09/02 MRN:  [id number]  Today's date:  09/09/2012  Referring Physician:    [facility]  Primary Physician:    Dr. [name]  Reason for Consultation:    Worsening shortness of breath and congestive heart failure  IMPRESSIONS: 1. Worsening of diastolic congestive heart failure due to reduction in her previously stable diuretic dose as well as the development of concomitant anemia 2. Recurrent right pleural effusion which in the past has been transudative 3. Valvular heart disease with moderate to severe mitral regurgitation, mild mitral stenosis, and mild to moderate aortic stenosis 4. Underlying lung disease with COPD and chronic respiratory failure with hypoxemia 5. History of atrial fibrillation 6. Prior exposure to amiodarone therapy be discontinued on March 21 7. Anemia 8.. History of stroke 9. Chronic kidney disease stage 2-3 with a creatinine that is probably in the 1.5-1.6 range when she is optimally diuresed  RECOMMENDATION: 1. She will need to have additional diuresis. Her current respiratory difficulty started when her furosemide was reduced from 80 mg to 20 mg by her primary care physician. I think that this likely is responsible for decompensation and she will probably need to have a higher baseline creatinine once she has been diuresed. 2. Obtain repeat echocardiogram 3. Address anemia as this is likely contributing to the worsening of her congestive heart failure 4. She is currently in sinus rhythm and would continue off of amiodarone at this time. 5. Consider repeat thoracentesis for symptomatic relief.  HISTORY: This 79 year-old female is well-known to me and was seen with recurrent pleural effusions. Her last echocardiogram was in December and I believe that her mitral regurgitation is more severe than was noted in that report. She has had 2 thoracenteses and during the last  admission had some transient atrial fibrillation that was thought to have worsened her diastolic heart failure. She responded to [facility] and converted to sinus rhythm. On a followup visit with Dr. Sherene Sires she was found to have a sedimentation rate of 120 and after some consideration I discontinued this on March 21. At the time she was done relatively well and was not dyspneic. She had preserved LV systolic function in December and did have significant valvular heart disease with aortic and mitral. She is not a candidate for surgery or further intervention.  Her primary care physician reduced her Lasix about 2 weeks ago because of renal function. We do not have those values but her admission creatinine was 1.6 which was similar to a previous one on discharge. Her BNP was elevated and she had an enlarging effusion. She also was found to be anemic with a hemoglobin of 7.5 no overt GI bleeding. She has not had any anginal pains. She has not had much in the way of edema since admission but did have some mild edema on admission.    Past Medical History  Diagnosis Date  . Stroke   . Anemia   . Hyperlipidemia   . Vitamin D deficiency   . Hypothyroid   . Pulmonary hypertension   . Chronic respiratory failure with hypoxia   . Blindness of right eye     Macular degeneration  . Macular degeneration   . Hypertensive heart disease 04/16/2012  . CHF (congestive heart failure)   . Hypertension   . UTI (urinary tract infection)   . Arthritis     KNEES      Past Surgical  History  Procedure Laterality Date  . Cataract extraction, bilateral       Allergies:  is allergic to lisinopril.   Medications: Prior to Admission medications   Medication Sig Start Date End Date Taking? Authorizing Provider  beta carotene w/minerals (OCUVITE) tablet Take 1 tablet by mouth 2 (two) times daily.    Yes Historical Provider, MD  cholecalciferol (VITAMIN D) 1000 UNITS tablet Take 1,000 Units by mouth every morning.     Yes Historical Provider, MD  diltiazem (CARDIZEM CD) 180 MG 24 hr capsule Take 180 mg by mouth every morning.  06/03/12  Yes Estela Isaiah Blakes, MD  Ferrous Sulfate (IRON) 325 (65 FE) MG TABS Take 1 tablet by mouth at bedtime.    Yes Historical Provider, MD  folic acid (FOLVITE) 1 MG tablet Take 1 mg by mouth at bedtime.    Yes Historical Provider, MD  furosemide (LASIX) 20 MG tablet Take 20 mg by mouth every morning.    Yes Historical Provider, MD  glucosamine-chondroitin 500-400 MG tablet Take 1 tablet by mouth 2 (two) times daily.    Yes Historical Provider, MD  levothyroxine (SYNTHROID, LEVOTHROID) 137 MCG tablet Take 137 mcg by mouth daily before breakfast.   Yes Historical Provider, MD  Multiple Vitamin (MULITIVITAMIN WITH MINERALS) TABS Take 1 tablet by mouth every morning.    Yes Historical Provider, MD  omeprazole (PRILOSEC) 40 MG capsule Take 40 mg by mouth every morning.    Yes Historical Provider, MD  simvastatin (ZOCOR) 10 MG tablet Take 10 mg by mouth at bedtime.   Yes Historical Provider, MD  traMADol (ULTRAM) 50 MG tablet Take 50 mg by mouth 2 (two) times daily.    Yes Historical Provider, MD  warfarin (COUMADIN) 5 MG tablet Take 2.5-5 mg by mouth See admin instructions. On Saturday take 2.5mg  & take 5mg  on all other days 06/03/12  Yes Estela Isaiah Blakes, MD    Family History: Family Status  Relation Status Death Age  . Mother Deceased     died of MI  . Father Deceased 55    heart disease  . Brother Deceased     died of MI    Social History:   reports that she quit smoking about 14 years ago. Her smoking use included Cigarettes. She has a 25 pack-year smoking history. She has never used smokeless tobacco. She reports that she does not drink alcohol or use illicit drugs.   History   Social History Narrative  . No narrative on file    Review of Systems: Her appetite has been fine. There's been no documented GI bleeding. She has had worsening dyspnea. Other  than as noted above the remainder of the review of systems is unremarkable.  Physical Exam: BP 139/58  Pulse 75  Temp(Src) 97.2 F (36.2 C) (Oral)  Resp 18  Ht 5\' 5"  (1.651 m)  Wt 74.118 kg (163 lb 6.4 oz)  BMI 27.19 kg/m2  SpO2 98%  General appearance: alert, cooperative, appears stated age and no distress Head: Normocephalic, without obvious abnormality, atraumatic Eyes: conjunctivae/corneas clear. PERRL, EOM's intact. Fundi benign. Throat: lips, mucosa, and tongue normal; teeth and gums normal Neck: no adenopathy, no carotid bruit and supple, symmetrical, trachea midline Lungs: Rales present in both lung fields in the lower segments with decreased breath sounds present at the right base halfway up area Heart: Regular rhythm with occasional irregular beats. Harsh 2/6 systolic murmur at aortic area and at the apex, no S3 Abdomen:  soft, non-tender; bowel sounds normal; no masses,  no organomegaly Pelvic: deferred Extremities: Chronic venous stasis noted, trace edema Pulses: 2+ and symmetric Neurologic: Grossly normal   Labs: CBC  Recent Labs  09/09/12 0630  WBC 4.9  RBC 2.62*  HGB 7.5*  HCT 23.7*  PLT 306  MCV 90.5  MCH 28.6  MCHC 31.6  RDW 16.1*  LYMPHSABS 0.8  MONOABS 0.7  EOSABS 0.3  BASOSABS 0.0   CMP   Recent Labs  09/09/12 0630  NA 142  K 3.6  CL 97  CO2 39*  GLUCOSE 64*  BUN 29*  CREATININE 1.33*  CALCIUM 9.3  PROT 6.9  ALBUMIN 2.9*  AST 24  ALT 11  ALKPHOS 93  BILITOT 0.2*  GFRNONAA 35*  GFRAA 41*   BNP (last 3 results)  Recent Labs  07/17/12 0957 09/02/12 1239 09/08/12 1823  PROBNP 142.0* 168.0* 707.4*   Cardiac Panel (last 3 results)  Recent Labs  09/08/12 1823 09/09/12 0042  TROPONINI <0.30 <0.30     Radiology: Increased right pleural effusion, pulmonary vascular congestion  EKG: Normal sinus rhythm, IV conduction delay  Signed:  W. Ashley Royalty MD [facility] [place]   Cardiology Consultant  09/09/2012, 5:05  PM

## 2012-09-09 NOTE — Consult Note (Signed)
PULMONARY  / CRITICAL CARE MEDICINE  Name: [name] MRN: [id number] DOB: 04/08/1925    ADMISSION DATE:  09/08/2012 CONSULTATION DATE:  09/09/12  REFERRING MD :  Midge Minium, MD PRIMARY SERVICE: [facility]  CHIEF COMPLAINT:   Recurrent pleural effusion, SOB  BRIEF PATIENT DESCRIPTION:  79 years old female with CHF, HTN, PAH presents with SOB on exertion associated to recurrent ptransudative pleural effusion. PCCM consult called to assist with thoracentesis.   LINES / TUBES: - Peripheral IV's   HISTORY OF PRESENT ILLNESS:   79 years old female with CHF, HTN, PAH presents with SOB on exertion associated to recurrent ptransudative pleural effusion. PCCM consult called to assist with thoracentesis. The patient denies CP, fever, chills. She does have a mild productive cough of whitish sputum that is chronic. At the time of my exam the patient is sleeping, breathing comfortably on 4 L [place] saturating 100%. She is hemodynamically stable. The patient is on coumadin for A. Fib and her INR is 2.28. Coumadin on hold.  PAST MEDICAL HISTORY :  Past Medical History  Diagnosis Date  . Stroke   . Anemia   . Hyperlipidemia   . Vitamin D deficiency   . Hypothyroid   . Pulmonary hypertension   . Chronic respiratory failure with hypoxia   . Blindness of right eye     Macular degeneration  . Macular degeneration   . Hypertensive heart disease 04/16/2012  . CHF (congestive heart failure)   . Hypertension   . UTI (urinary tract infection)   . Arthritis     KNEES   Past Surgical History  Procedure Laterality Date  . Cataract extraction, bilateral     Prior to Admission medications   Medication Sig Start Date End Date Taking? Authorizing Provider  beta carotene w/minerals (OCUVITE) tablet Take 1 tablet by mouth 2 (two) times daily.    Yes Historical Provider, MD  cholecalciferol (VITAMIN D) 1000 UNITS tablet Take 1,000 Units by mouth every morning.    Yes Historical Provider, MD   diltiazem (CARDIZEM CD) 180 MG 24 hr capsule Take 180 mg by mouth every morning.  06/03/12  Yes Estela Isaiah Blakes, MD  Ferrous Sulfate (IRON) 325 (65 FE) MG TABS Take 1 tablet by mouth at bedtime.    Yes Historical Provider, MD  folic acid (FOLVITE) 1 MG tablet Take 1 mg by mouth at bedtime.    Yes Historical Provider, MD  furosemide (LASIX) 20 MG tablet Take 20 mg by mouth every morning.    Yes Historical Provider, MD  glucosamine-chondroitin 500-400 MG tablet Take 1 tablet by mouth 2 (two) times daily.    Yes Historical Provider, MD  levothyroxine (SYNTHROID, LEVOTHROID) 137 MCG tablet Take 137 mcg by mouth daily before breakfast.   Yes Historical Provider, MD  Multiple Vitamin (MULITIVITAMIN WITH MINERALS) TABS Take 1 tablet by mouth every morning.    Yes Historical Provider, MD  omeprazole (PRILOSEC) 40 MG capsule Take 40 mg by mouth every morning.    Yes Historical Provider, MD  simvastatin (ZOCOR) 10 MG tablet Take 10 mg by mouth at bedtime.   Yes Historical Provider, MD  traMADol (ULTRAM) 50 MG tablet Take 50 mg by mouth 2 (two) times daily.    Yes Historical Provider, MD  warfarin (COUMADIN) 5 MG tablet Take 2.5-5 mg by mouth See admin instructions. On Saturday take 2.5mg  & take 5mg  on all other days 06/03/12  Yes Estela Isaiah Blakes, MD   Allergies  Allergen Reactions  . Lisinopril Other (See Comments)    pancreatitis    FAMILY HISTORY:  Family History  Problem Relation Age of Onset  . Heart disease Mother   . Heart disease Father   . Heart disease Brother   . Breast cancer Daughter   . Lymphoma Brother    SOCIAL HISTORY:  reports that she quit smoking about 14 years ago. Her smoking use included Cigarettes. She has a 25 pack-year smoking history. She has never used smokeless tobacco. She reports that she does not drink alcohol or use illicit drugs.  REVIEW OF SYSTEMS:  All systems reviewed and found negative except for what I mentioned in the HPI.  SUBJECTIVE:    VITAL SIGNS: Temp:  [97 F (36.1 C)-97.9 F (36.6 C)] 97.2 F (36.2 C) (04/28 0100) Pulse Rate:  [66-84] 77 (04/28 0100) Resp:  [14-32] 20 (04/28 0100) BP: (133-160)/(45-122) 139/57 mmHg (04/28 0100) SpO2:  [88 %-100 %] 97 % (04/28 0100) FiO2 (%):  [100 %] 100 % (04/27 2119) Weight:  [166 lb 3.2 oz (75.388 kg)-169 lb 15.6 oz (77.1 kg)] 166 lb 3.2 oz (75.388 kg) (04/27 2220) HEMODYNAMICS:   VENTILATOR SETTINGS: Vent Mode:  [-]  FiO2 (%):  [100 %] 100 % INTAKE / OUTPUT: Intake/Output     04/27 0701 - 04/28 0700   Urine (mL/kg/hr) 950   Total Output 950   Net -950         PHYSICAL EXAMINATION: General: No apparent distress. Eyes: Anicteric sclerae. ENT: Oropharynx clear. Moist mucous membranes. No thrush Lymph: No cervical, supraclavicular, or axillary lymphadenopathy. Heart: Normal S1, S2. Systolic murmur III/VI. No, rubs, or gallops appreciated. No bruits, equal pulses. Lungs:  Good air movement bilaterally, with diminished breath sounds to the right base only. No crackles or wheezing. Abdomen: Abdomen soft, non-tender and not distended, normoactive bowel sounds. No hepatosplenomegaly or masses. Musculoskeletal: No clubbing or synovitis. 1+ LE edema Skin: No rashes or lesions Neuro: No focal neurologic deficits.  LABS:  Recent Labs Lab 09/02/12 1239 09/08/12 1823 09/09/12 0040 09/09/12 0042  HGB 9.0* 7.4* 7.5*  --   WBC 5.4 4.3 5.5  --   PLT 288.0 292 278  --   NA 136 141  --   --   K 4.6 4.0  --   --   CL 92* 98  --   --   CO2 36* 38*  --   --   GLUCOSE 110* 148*  --   --   BUN 32* 31*  --   --   CREATININE 1.6* 1.28*  --   --   CALCIUM 9.5 9.5  --   --   INR  --  2.28*  --   --   TROPONINI  --  <0.30  --  <0.30  PROBNP 168.0* 707.4*  --   --    No results found for this basename: GLUCAP,  in the last 168 hours  CXR:  - Worsening right pleural effusion. Now moderate in size.   ASSESSMENT:  79 years old with diastolic CHF, A. Fib on coumadin,  PAH  and recurrent transudative pleural effusions. Presents with worsening SOB on exertion. Chest X ray showed worsening recurrent right pleural effusion. Now moderate in size. We agree the patient will benefit from therapeutic thoracentesis. At this time her INR is 2.28. The patient is in stable condition, breathing comfortably at rest and with no signs or symptoms of pneumonia. It is prudent to hold the coumadin  and wait until her INR is < 1.5 to perform her thoracentesis.  PLAN: - Continue to hold coumadin - Agree with diuresis - Will follow INR - Thoracentesis when INR is < 1.5  I have personally obtained a history, examined the patient, evaluated laboratory and imaging results, formulated the assessment and plan and placed orders. Time devoted to patient care services described in this note is 45 minutes.   Overton Mam, MD Pulmonary and Critical Care Medicine [facility] Pager: [phone]  09/09/2012, 2:25 AM

## 2012-09-09 NOTE — Progress Notes (Signed)
Patient ID: [name], female   DOB: 05/30/1924, 79 y.o.   MRN: [id number] [name], [name]    Date of visit:  08/02/2012 DOB:  09/16/1924    Age:  79 yrs. Medical record number:  [id number]     Account number:  [id number] Primary Care Provider: [name] ____________________________ CURRENT DIAGNOSES  1. Congestive Heart Failure Chronic Diastolic  2. Long-term (current) use of anticoagulants  3. Arrhythmia-Atrial Fibrillation  4. Hypertensive Heart Disease-Benign without CHF  5. Mitral Valve Disorder  6. Hyperlipidemia  7. Hypothyroidism  8. Personal history of TIA or stroke without residua ____________________________ ALLERGIES  lisinopril, Pancretitis ____________________________ MEDICATIONS  1. Multi-Vitamin Tab, 1 p.o. daily  2. folic acid 400 mcg Tablet, 1 p.o. daily  3. omeprazole 40 mg capsule,delayed release(DR/EC), 1 p.o. daily  4. tramadol 50 mg tablet, bid  5. Vitamin D3 1,000 unit tablet, 1 p.o. daily  6. Iron (ferrous sulfate) 325 mg (65 mg iron) tablet, 1 p.o. daily  7. Glucosamine-Chond-MSM Complex 375-500-15-0.5 mg tablet, BID  8. Ocuvite 150-30-6-150 mg-unit-mg-mg capsule, BID  9. Calcium 600 + D(3) 600-125 mg-unit tablet, 1 p.o. daily  10. Cardizem CD 180 mg capsule,extended release 24hr, 1 p.o. daily  11. furosemide 80 mg tablet, 1 p.o. daily  12. simvastatin 10 mg tablet, 1 p.o. daily  13. warfarin 5 mg tablet, Take as directed  14. Aldactone 25 mg tablet, 1 p.o. daily  15. levothyroxine 112 mcg tablet, 1 p.o. daily ____________________________ CHIEF COMPLAINTS  Followup of Arrhythmia-Atrial Fibrillation ____________________________ HISTORY OF PRESENT ILLNESS  Patient seen for cardiac followup. She does not feel well today. She complains of feeling jittery and has significant malaise and fatigue. I received a note from the pulmonary physician who wondered whether he could be having reaction to amiodarone. She had a previous pleural effusion that was tapped but  this has now reaccumulated. She has really never had much in the way of edema. She is somewhat limited and walks with a walker and does not have any PND orthopnea. She does have some cough. She doesn't have angina. She was placed on amiodarone for atrial fibrillation. She does not have any bleeding complications from warfarin. The pulmonary physician put her on spironolactone and this also could be causing some of her symptoms. ____________________________ PAST HISTORY  Past Medical Illnesses:  hypertension, hyperlipidemia, hypothyroidism, obesity, CVA without deficits, chronic anemia, macular degeneration;  Cardiovascular Illnesses:  diastolic CHF;  Surgical Procedures:  no prior surgical procedures;  Cardiology Procedures-Invasive:  no previous interventional or invasive cardiology procedures;  Cardiology Procedures-Noninvasive:  echocardiogram December 2013;  LVEF of 55% documented via echocardiogram on 04/16/2012 ,  ____________________________ CARDIO-PULMONARY TEST DATES EKG Date:  07/02/2012;  Echocardiography Date: 04/16/2012;  Chest Xray Date: 04/20/2012;   ____________________________ SOCIAL HISTORY Alcohol Use:  does not use alcohol;  Smoking:  nonsmoker;  Diet:  regular diet;  Lifestyle:  married and 3 daughters;  Exercise:  exercise is limited due to physical disability;  Residence:  lives with daughter;   ____________________________ REVIEW OF SYSTEMS General:  malaise and fatigue  Integumentary:easy bruisability Eyes: decreased acuity, vision loss OD, macular degeneration Ears, Nose, Throat, Mouth:  partial hearing loss Respiratory: mild dyspnea with exertion Cardiovascular:  please review HPI Genitourinary-Female: frequency, nocturia, recurrent urinary tract infections Musculoskeletal:  arthritis of the knees Neurological:  gait disturbance  ____________________________ PHYSICAL EXAMINATION VITAL SIGNS  Blood Pressure:  110/64 Sitting, Left arm, regular cuff  , 110/60 Standing, Left  arm and regular cuff   Pulse:  76/min. Weight:  163.00 lbs. Height:  64"BMI: 28  Constitutional:  pleasant white female, in no acute distress walks with walker Head:  normocephalic, normal hair pattern, no masses or tenderness Eyes:  right eye cloudy, funduscopic exam not performed ENT:  ears, nose and throat reveal no gross abnormalities.  Dentition good. Chest:  reduced breath sounds in right base Cardiac:  irregularly irregular rhythm, normal S1 and S2, no S3 or S4, grade 2/6 systolic murmur Peripheral Pulses:  1+ pedal pulses Extremities & Back:  no deformities, clubbing, cyanosis, erythema or edema observed. Normal muscle strength and tone. Neurological:  no gross motor or sensory deficits noted, affect appropriate, oriented x3. ____________________________ MOST RECENT LIPID PANEL 07/17/11  CHOL TOTL 191 mg/dl, LDL 295 calc, HDL 73 mg/dl, TRIGLYCER 57 mg/dl and CHOL/HDL 2.6 (Calc) ____________________________ IMPRESSIONS/PLAN  1. Malaise and fatigue of uncertain cause 2. Chronic diastolic heart failure 3. Atrial fibrillation paroxysmal 4. Hypertensive heart disease 5. Recurrent pleural effusion 6. Hypoxemia currently using home oxygen  Recommendations:  I am to stop her amiodarone at this time. She is at risk for going back in atrial fibrillation but this could be causing some of her malaise and feeling poorly. She has a host of other issues also going on. I will check a metabolic panel to be sure she does not have hyperkalemia from the spironolactone. ____________________________ TODAYS ORDERS  1. Coag Clinic Visit: Coag OV 7 days  2. Return Visit: 6 weeks  3. Basic Metabolic Panel: Today                       ____________________________ Cardiology Physician:  Darden Palmer MD [facility]

## 2012-09-10 ENCOUNTER — Inpatient Hospital Stay (HOSPITAL_COMMUNITY): Payer: Medicare Other

## 2012-09-10 DIAGNOSIS — I4891 Unspecified atrial fibrillation: Secondary | ICD-10-CM

## 2012-09-10 LAB — BASIC METABOLIC PANEL
BUN: 27 mg/dL — ABNORMAL HIGH (ref 6–23)
CO2: 40 mEq/L (ref 19–32)
Chloride: 95 mEq/L — ABNORMAL LOW (ref 96–112)
Creatinine, Ser: 1.28 mg/dL — ABNORMAL HIGH (ref 0.50–1.10)
GFR calc Af Amer: 43 mL/min — ABNORMAL LOW (ref >90)
Glucose, Bld: 103 mg/dL — ABNORMAL HIGH (ref 70–99)
Potassium: 4 mEq/L (ref 3.5–5.1)

## 2012-09-10 LAB — BODY FLUID CELL COUNT WITH DIFFERENTIAL
Eos, Fluid: 1 %
Lymphs, Fluid: 85 %
Monocyte-Macrophage-Serous Fluid: 13 % — ABNORMAL LOW (ref 50–90)
Neutrophil Count, Fluid: 1 % (ref 0–25)
Total Nucleated Cell Count, Fluid: 455 uL (ref 0–1000)

## 2012-09-10 LAB — PROTIME-INR
INR: 1.7 — ABNORMAL HIGH (ref 0.00–1.49)
Prothrombin Time: 19.4 s — ABNORMAL HIGH (ref 11.6–15.2)

## 2012-09-10 LAB — CBC
HCT: 23.7 % — ABNORMAL LOW (ref 36.0–46.0)
Hemoglobin: 7.4 g/dL — ABNORMAL LOW (ref 12.0–15.0)
MCV: 91.2 fL (ref 78.0–100.0)
RBC: 2.6 MIL/uL — ABNORMAL LOW (ref 3.87–5.11)
RDW: 16.1 % — ABNORMAL HIGH (ref 11.5–15.5)
WBC: 5.3 10*3/uL (ref 4.0–10.5)

## 2012-09-10 LAB — PROTEIN, BODY FLUID: Total protein, fluid: 2.5 g/dL

## 2012-09-10 LAB — LACTATE DEHYDROGENASE, PLEURAL OR PERITONEAL FLUID: LD, Fluid: 90 U/L — ABNORMAL HIGH (ref 3–23)

## 2012-09-10 LAB — GLUCOSE, SEROUS FLUID: Glucose, Fluid: 124 mg/dL

## 2012-09-10 MED ORDER — FUROSEMIDE 10 MG/ML IJ SOLN
INTRAMUSCULAR | Status: AC
  Filled 2012-09-10: qty 4

## 2012-09-10 MED ORDER — SODIUM CHLORIDE 0.9 % IV SOLN
250.0000 mg | Freq: Once | INTRAVENOUS | Status: AC
  Administered 2012-09-10: 250 mg via INTRAVENOUS
  Filled 2012-09-10: qty 20

## 2012-09-10 MED ORDER — FUROSEMIDE 10 MG/ML IJ SOLN
40.0000 mg | Freq: Three times a day (TID) | INTRAMUSCULAR | Status: DC
  Administered 2012-09-10 – 2012-09-11 (×4): 40 mg via INTRAVENOUS
  Filled 2012-09-10 (×5): qty 4

## 2012-09-10 NOTE — Progress Notes (Signed)
[facility] PROGRESS NOTE Subjective Pt had another paracentesis today due to dyspnea No gross GI bleeding. Hg stable.  Objective: Vital signs in last 24 hours: Temp:  [97.2 F (36.2 C)-97.4 F (36.3 C)] 97.3 F (36.3 C) (04/29 0439) Pulse Rate:  [75-88] 88 (04/29 0439) Resp:  [18-20] 20 (04/29 0439) BP: (120-139)/(39-85) 120/39 mmHg (04/29 0937) SpO2:  [81 %-98 %] 81 % (04/29 0849) Weight:  [74.072 kg (163 lb 4.8 oz)] 74.072 kg (163 lb 4.8 oz) (04/29 0439) Last BM Date: 09/08/12  Intake/Output from previous day: 04/28 0701 - 04/29 0700 In: 683 [P.O.:680; I.V.:3] Out: 1625 [Urine:1625] Intake/Output this shift: Total I/O In: 363 [P.O.:360; I.V.:3] Out: 875 [Urine:875]   Lab Results:  Recent Labs  09/08/12 1823 09/09/12 0040 09/09/12 0630 09/10/12 0639  WBC 4.3 5.5 4.9 5.3  HGB 7.4* 7.5* 7.5* 7.4*  HCT 23.3* 23.2* 23.7* 23.7*  PLT 292 278 306 312   BMET  Recent Labs  09/08/12 1823 09/09/12 0630 09/10/12 0639  NA 141 142 140  K 4.0 3.6 4.0  CL 98 97 95*  CO2 38* 39* 40*  CREATININE 1.28* 1.33* 1.28*   LFT  Recent Labs  09/09/12 0630  PROT 6.9  AST 24  ALT 11  ALKPHOS 93  BILITOT 0.2*   PT/INR  Recent Labs  09/08/12 1823 09/09/12 0630 09/10/12 0639  LABPROT 24.1* 23.1* 19.4*  INR 2.28* 2.15* 1.70*   PANCREAS No results found for this basename: LIPASE,  in the last 72 hours       Studies/Results: Dg Chest 2 View  09/08/2012  *RADIOLOGY REPORT*  Clinical Data: Short of breath.  Right-sided back pain.  Irregular heart beat.  CHEST - 2 VIEW  Comparison: 09/02/2012.  Findings: Slight increase in the right pleural effusion which now layers more dependently.  Increasing left basilar subsegmental atelectasis.  Pulmonary vascular congestion.  The findings suggest volume overload/CHF.  Cardiopericardial silhouette appears similar.  IMPRESSION: Increasing right pleural effusion, now moderate.  Associated atelectasis and increasing  atelectasis at the left lung base. Pulmonary vascular congestion with cephalization of blood flow suggest volume overload.   Original Report Authenticated By: Andreas Newport, M.D.    Dg Chest Port 1 View  09/10/2012  *RADIOLOGY REPORT*  Clinical Data: Follow-up right thoracentesis.  PORTABLE CHEST - 1 VIEW  Comparison: 09/08/2012  Findings: Decreasing right pleural effusion following thoracentesis.  No pneumothorax.  Small right pleural effusion with right lower lobe airspace disease persists.  Stable left basilar atelectasis or scarring.  Underlying COPD.  IMPRESSION: Decreasing right pleural effusion and right lower lobe airspace disease following thoracentesis.  No pneumothorax.   Original Report Authenticated By: Charlett Nose, M.D.     Medications: I have reviewed the patient's current medications.  Assessment/Plan: 1. Anemia/trace positive stools. No gross bleeding Main issue at this time is CHF and dyspnea. Not sure EGD would change treatment at this time and would continue PPI therapy. Will follow   Herberth Deharo JR,Branden Vine L 09/10/2012, 12:40 PM

## 2012-09-10 NOTE — Evaluation (Signed)
Physical Therapy Evaluation Patient Details Name: [name] MRN: [id number] DOB: 1924-07-03 Today's Date: 09/10/2012 Time: 8295-6213 PT Time Calculation (min): 14 min  PT Assessment / Plan / Recommendation Clinical Impression  Pt is an 79 yo female admitted for SOB. Pt ambulation limited this date due to SpO2 dropping into high 70s, low 80s with standing only on 3Lo2 via [place]. Pt planned to have thoracentesis this morning. Will re-evaluate ambulation and stair negotionat 4/30. Suspect pt to safe d/c home with spouse.    PT Assessment  Patient needs continued PT services    Follow Up Recommendations  No PT follow up;Supervision/Assistance - 24 hour    Does the patient have the potential to tolerate intense rehabilitation      Barriers to Discharge        Equipment Recommendations  None recommended by PT    Recommendations for Other Services     Frequency Min 2X/week    Precautions / Restrictions Precautions Precautions: Fall Precaution Comments: pt on 3 LO2 via , dec SpO2 with all mobility Restrictions Weight Bearing Restrictions: No   Pertinent Vitals/Pain Pt denies pain, except back pain at night only      Mobility  Bed Mobility Bed Mobility: Sit to Supine Supine to Sit: 4: Min assist Sitting - Scoot to Edge of Bed: 5: Supervision Sit to Supine: 5: Supervision Details for Bed Mobility Assistance: no labored effort, safe technique Transfers Transfers: Sit to Stand;Stand to Sit Sit to Stand: 4: Min guard;With upper extremity assist;From bed Stand to Sit: 4: Min guard;With upper extremity assist;To bed Details for Transfer Assistance: safe technique, mild SOB, SpO2 down to 81% Ambulation/Gait Ambulation/Gait Assistance: Not tested (comment) (pt's SpO2 dec to 81 in standing position) Ambulation/Gait Assistance Details: pt unable to tolerate ambulation at this time    Exercises     PT Diagnosis: Generalized weakness  PT Problem List: Decreased activity  tolerance PT Treatment Interventions: Stair training;Gait training;Functional mobility training   PT Goals Acute Rehab PT Goals PT Goal Formulation: With patient Time For Goal Achievement: 09/17/12 Potential to Achieve Goals: Good Pt will go Supine/Side to Sit: Independently;with HOB 0 degrees PT Goal: Supine/Side to Sit - Progress: Goal set today Pt will go Sit to Stand: Independently;with upper extremity assist PT Goal: Sit to Stand - Progress: Goal set today Pt will Ambulate: >150 feet;with modified independence;with least restrictive assistive device PT Goal: Ambulate - Progress: Goal set today Pt will Go Up / Down Stairs: 3-5 stairs;with supervision;with rail(s) PT Goal: Up/Down Stairs - Progress: Goal set today  Visit Information  Last PT Received On: 09/10/12 Assistance Needed: +1 PT/OT Co-Evaluation/Treatment: Yes    Subjective Data  Subjective: Pt received supine in bed with report "They are going to drain this fluid off my lungs in a little bit." Patient Stated Goal: home   Prior Functioning  Home Living Lives With: Spouse Available Help at Discharge: Family;Available 24 hours/day Type of Home: House Home Access: Stairs to enter [employer] of Steps: 2 front one rail; 4 back 2 rails can reach Home Layout: One level Bathroom Shower/Tub: Engineer, manufacturing systems: Handicapped height Home Adaptive Equipment: Quad cane;Grab bars in shower;Shower chair with back Additional Comments: On 3 liters at home, had been on 2 until this last week Prior Function Level of Independence: Needs assistance;Independent with assistive device(s) [facility]) Needs Assistance: Light Housekeeping Light Housekeeping: Total Able to Take Stairs?: Yes Driving: No Vocation: Retired Musician: HOH (hearing aid on the right) Dominant Hand:  Right    Cognition  Cognition Arousal/Alertness: Awake/alert Behavior During Therapy: WFL for tasks  assessed/performed Overall Cognitive Status: Within Functional Limits for tasks assessed    Extremity/Trunk Assessment Right Upper Extremity Assessment RUE ROM/Strength/Tone: Within functional levels Left Upper Extremity Assessment LUE ROM/Strength/Tone: Within functional levels Right Lower Extremity Assessment RLE ROM/Strength/Tone: Within functional levels Left Lower Extremity Assessment LLE ROM/Strength/Tone: Within functional levels Trunk Assessment Trunk Assessment: Normal   Balance Balance Balance Assessed: Yes Static Sitting Balance Static Sitting - Balance Support: Right upper extremity supported;Feet supported Static Sitting - Level of Assistance: 7: Independent Static Sitting - Comment/# of Minutes: 10 minutes with no leaning or LOB  End of Session PT - End of Session Activity Tolerance: Patient tolerated treatment well Patient left: in bed;with call bell/phone within reach Nurse Communication: Mobility status  GP     Marcene Brawn 09/10/2012, 10:15 AM  Lewis Shock, PT, DPT Pager #: [phone] Office #: [phone]

## 2012-09-10 NOTE — Evaluation (Signed)
Occupational Therapy Evaluation Patient Details Name: [name] MRN: [id number] DOB: 1924-05-20 Today's Date: 09/10/2012 Time: 4098-1191 OT Time Calculation (min): 14 min  OT Assessment / Plan / Recommendation Clinical Impression  This 79 yo female admitted SOB, CHF, and decrease in O2 sats with activity on 3 liters (per pt for thorocentsis today) presents to acute OT with problems below. Will benefit from acute OT without need for follow up.    OT Assessment  Patient needs continued OT Services    Follow Up Recommendations  No OT follow up    Barriers to Discharge None    Equipment Recommendations  None recommended by OT       Frequency  Min 2X/week    Precautions / Restrictions Precautions Precautions: Fall Restrictions Weight Bearing Restrictions: No   Pertinent Vitals/Pain O2 sats dropped to 81 on 3 liters of O2 just with sit to stand and pt fixing the pad on her bed    ADL  Eating/Feeding: Simulated;Independent Where Assessed - Eating/Feeding: Edge of bed Grooming: Simulated;Set up;Supervision/safety Where Assessed - Grooming: Unsupported sitting Upper Body Bathing: Simulated;Set up;Supervision/safety Where Assessed - Upper Body Bathing: Unsupported sitting Lower Body Bathing: Simulated;Min guard Where Assessed - Lower Body Bathing: Unsupported sit to stand Upper Body Dressing: Simulated;Supervision/safety;Set up Where Assessed - Upper Body Dressing: Unsupported sitting Lower Body Dressing: Simulated;Min guard Where Assessed - Lower Body Dressing: Unsupported sit to stand Toilet Transfer: Simulated;Min guard Toilet Transfer Method: Sit to Barista:  (sit to stand from bed) Toileting - Architect and Hygiene: Simulated;Min guard Where Assessed - Toileting Clothing Manipulation and Hygiene: Standing Transfers/Ambulation Related to ADLs: Min guard A sit to stand and stand to sit ADL Comments: Can cross legs to get to feet     OT Diagnosis: Generalized weakness  OT Problem List: Decreased activity tolerance;Cardiopulmonary status limiting activity OT Treatment Interventions: Self-care/ADL training;Energy conservation;Patient/family education;Balance training   OT Goals Acute Rehab OT Goals OT Goal Formulation: With patient Time For Goal Achievement: 09/17/12 Potential to Achieve Goals: Good ADL Goals Pt Will Perform Grooming: with modified independence;Unsupported;Standing at sink;Sitting at sink ADL Goal: Grooming - Progress: Goal set today Pt Will Transfer to Toilet: with modified independence;Ambulation;with DME;Grab bars;Comfort height toilet;3-in-1;Regular height toilet Pt Will Perform Toileting - Clothing Manipulation: Independently;Standing ADL Goal: Toileting - Clothing Manipulation - Progress: Goal set today Pt Will Perform Toileting - Hygiene: Independently;Sit to stand from 3-in-1/toilet ADL Goal: Toileting - Hygiene - Progress: Goal set today Miscellaneous OT Goals Miscellaneous OT Goal #1: Pt will use purse lipped breathing prn during OT session. OT Goal: Miscellaneous Goal #1 - Progress: Goal set today Miscellaneous OT Goal #2: Pt will be aware of energy conservation techiniques from handout that may benefit her. OT Goal: Miscellaneous Goal #2 - Progress: Goal set today  Visit Information  Last OT Received On: 09/10/12 PT/OT Co-Evaluation/Treatment: Yes    Subjective Data  Subjective: I haven't bben able to do much with all this fluid on me   Prior Functioning     Home Living Lives With: Spouse Available Help at Discharge: Family;Available 24 hours/day Type of Home: House Home Access: Stairs to enter [employer] of Steps: 2 front one rail; 4 back 2 rails can reach Home Layout: One level Bathroom Shower/Tub: Engineer, manufacturing systems: Handicapped height Home Adaptive Equipment: Quad cane;Grab bars in shower;Shower chair with back Additional Comments: On 3 liters  at home, had been on 2 until this last week Prior Function Level of Independence: Needs  assistance;Independent with assistive device(s) [facility]) Needs Assistance: Light Housekeeping Light Housekeeping: Total Able to Take Stairs?: Yes Driving: No Vocation: Retired Musician: HOH (hearing aid on the right) Dominant Hand: Right         Vision/Perception Vision - History Baseline Vision: Legally blind (blind right eye, vision bad in left eye) Patient Visual Report: No change from baseline   Cognition  Cognition Arousal/Alertness: Awake/alert Behavior During Therapy: WFL for tasks assessed/performed Overall Cognitive Status: Within Functional Limits for tasks assessed    Extremity/Trunk Assessment Right Upper Extremity Assessment RUE ROM/Strength/Tone: Within functional levels Left Upper Extremity Assessment LUE ROM/Strength/Tone: Within functional levels Right Lower Extremity Assessment RLE ROM/Strength/Tone: Within functional levels Left Lower Extremity Assessment LLE ROM/Strength/Tone: Within functional levels Trunk Assessment Trunk Assessment: Normal     Mobility Bed Mobility Bed Mobility: Sit to Supine Supine to Sit: 4: Min assist Sitting - Scoot to Edge of Bed: 5: Supervision Sit to Supine: 5: Supervision Transfers Transfers: Sit to Stand;Stand to Sit Sit to Stand: 4: Min guard;With upper extremity assist;From bed Stand to Sit: 4: Min guard;With upper extremity assist;To bed           End of Session OT - End of Session Activity Tolerance:  (Pt limited by decrease in O2 sats with activity) Patient left: in bed;with call bell/phone within reach;with bed alarm set       Evette Georges [phone] 09/10/2012, 9:02 AM

## 2012-09-10 NOTE — Procedures (Signed)
Thoracentesis Procedure Note  Pre-operative Diagnosis: Pleural effusion  Post-operative Diagnosis: same  Indications: 79 yo female with recurrent right pleural effusion and progressive dyspnea.  Procedure Details  Consent: Informed consent was obtained. Risks of the procedure were discussed including: infection, bleeding, pain, pneumothorax.  Under sterile conditions the patient was positioned. Betadine solution and sterile drapes were utilized.  1% plain lidocaine was used to anesthetize the right 7th rib space. Fluid was obtained without any difficulties and minimal blood loss.  A dressing was applied to the wound and wound care instructions were provided.   Findings 1500 ml of semi-opaque, yellow pleural fluid was obtained.  Complications:  None; patient tolerated the procedure well.          Condition: stable  Plan A follow up chest x-ray was ordered. Bed Rest for 2 hours. Specimen sent for glucose, protein, LDH, cell count, gram stain and culture, and cytology.   Attending Attestation: I performed the procedure.

## 2012-09-10 NOTE — Progress Notes (Addendum)
PULMONARY  / CRITICAL CARE MEDICINE  Name: [name] MRN: [id number] DOB: [birth year]    ADMISSION DATE:  09/08/2012 CONSULTATION DATE:  09/08/2012   REFERRING MD :  Lonia Blood  CHIEF COMPLAINT:  Dyspnea  BRIEF PATIENT DESCRIPTION:  79 yo female former smoker admitted with progressive dyspnea and cough.  She has hx of recurrent right pleural effusion s/p multiple thoracentesis procedures.  PCCM consulted to assess dyspnea, and recurrent right pleural effusion first noted October 2013.  She is followed by Dr. Sherene Sires in office.  STUDIES:  02/21/12 Spirometry >> FEV1 0.67 (41%), FEV1% 64 04/16/12 Echo >> mod LVH, EF 55%, mild AR, mod MR, mod LA dilation, PAS 44 mmHg 04/18/12 Rt thoracentesis >> protein 2.5, LDH 76, WBC 575 (1N, 80L, 64M), small lymphocytes on cytology 05/31/12 Rt thoracentesis >> protein 2.9, LDH 101, WBC 465 (1N, 80L, 25M, 1E), small lymphocytes on cytology >> flow cytometry negative 09/10/12 Rt thoracentesis >>  SUBJECTIVE:  Still has dry cough and dyspnea with activity.  VITAL SIGNS: Temp:  [97.2 F (36.2 C)-97.4 F (36.3 C)] 97.3 F (36.3 C) (04/29 0439) Pulse Rate:  [75-88] 88 (04/29 0439) Resp:  [18-20] 20 (04/29 0439) BP: (120-139)/(39-85) 120/39 mmHg (04/29 0937) SpO2:  [81 %-98 %] 81 % (04/29 0849) Weight:  [163 lb 4.8 oz (74.072 kg)] 163 lb 4.8 oz (74.072 kg) (04/29 0439) 3 liters [place]  PHYSICAL EXAMINATION: General: No distress Neuro: Alert, follows commands HEENT:  No sinus tenderness Cardiovascular:  Irregular, 2/6 SM  Lungs: decreased BS Rt base, no wheeze Abdomen: soft, non tender Musculoskeletal: no edema Skin: no rashes   Recent Labs Lab 09/08/12 1823 09/09/12 0630 09/10/12 0639  NA 141 142 140  K 4.0 3.6 4.0  CL 98 97 95*  CO2 38* 39* 40*  BUN 31* 29* 27*  CREATININE 1.28* 1.33* 1.28*  GLUCOSE 148* 64* 103*    Recent Labs Lab 09/09/12 0040 09/09/12 0630 09/10/12 0639  HGB 7.5* 7.5* 7.4*  HCT 23.2* 23.7* 23.7*  WBC 5.5 4.9  5.3  PLT 278 306 312   Dg Chest 2 View  09/08/2012  *RADIOLOGY REPORT*  Clinical Data: Short of breath.  Right-sided back pain.  Irregular heart beat.  CHEST - 2 VIEW  Comparison: 09/02/2012.  Findings: Slight increase in the right pleural effusion which now layers more dependently.  Increasing left basilar subsegmental atelectasis.  Pulmonary vascular congestion.  The findings suggest volume overload/CHF.  Cardiopericardial silhouette appears similar.  IMPRESSION: Increasing right pleural effusion, now moderate.  Associated atelectasis and increasing atelectasis at the left lung base. Pulmonary vascular congestion with cephalization of blood flow suggest volume overload.   Original Report Authenticated By: Andreas Newport, M.D.     Lab Results  Component Value Date   INR 1.70* 09/10/2012   INR 2.15* 09/09/2012   INR 2.28* 09/08/2012    ASSESSMENT / PLAN:  Progressive dyspnea with recurrent Rt pleural effusion.   Previous fluid analysis has shown benign, transudate.  Has significant cardiac disease, but no improvement with diuresis.  No hx of renal or liver disease. Plan: -f/u thoracentesis results from 4/29 -may be beneficial to consider pleur-x catheter for symptomatic management of effusion depending on repeat fluid analysis  Former smoker with hx of COPD. Plan: -PRN bronchodilators  Acute on chronic hypoxia. Plan: -adjust oxygen to keep SpO2 > 92%  Hx of A fib, valvular heart disease. Plan: -per primary team -coumadin held due to possible GI bleeding  Anemia. Plan: -per primary team  and GI  Coralyn Helling, MD [facility] Pulmonary/Critical Care 09/10/2012, 10:11 AM Pager:  [phone] After 3pm call: [phone]   Spoke with pt's daughter, [name].  Updated her about thoracentesis.  Explained that her mother may need pleur-x catheter depending on fluid analysis and whether pleural effusion re-accumulates.  Coralyn Helling, MD [facility] Pulmonary/Critical Care 09/10/2012, 11:05  AM Pager:  [phone] After 3pm call: [phone]

## 2012-09-10 NOTE — Progress Notes (Signed)
  Echocardiogram 2D Echocardiogram has been performed.  Ellender Hose A 09/10/2012, 12:40 PM

## 2012-09-10 NOTE — Progress Notes (Addendum)
[facility] PROGRESS NOTE  [name] RUE:[id number] DOB: January 28, 1925 DOA: 09/08/2012 PCP: [name], MD  [name] is a 79 y.o. female with known history of diastolic CHF last EF measured was 55% on December 2013, atrial  fibrillation on Coumadin, recurrent pleural effusion transudative had required thoracentesis previously, aortic stenosis/mitral regurgitation and stenosis, chronic kidney disease presented to the ER because of worsening shortness of breath. Patient has shortness of breath on exertion and increased presently on minimal exertion. Denies any fever chills chest pain. In the ER chest x-ray revealed increasing pleural effusion and also showed drop in hemoglobin. Patient states she has poor vision and not sure if she has been having rectal bleed. Patient has not used any NSAIDs. Stool for a blood was positive but ER physician status was not black but brown in color. Last 2 weeks patient's Lasix dose was decreased to 20 mg daily because of worsening kidney function. At this time patient was given one dose of Lasix 40 mg IV and will be admitted for further management of her CHF, pleural effusion, dyspnea.     Assessment/Plan: CHF with history of diastolic CHF, mitral stenosis and mitral regurgitation  last EF measured 55% on December 2013 - continue diuretics. Dose of Furosemide was increased to 80 mg daily per recommendations from Cardiology on 4/28.   Patient's dyspnea could have also worsened because of anemia.   Recurrent transudative pleural effusions (first mentioned in 2010 on a CT scan of the chest)  Last time fluid was sent for analysis 05/2012 it was conclusive transudate  - patient has diastolic dysfunction, pulm HTN, low albumin, CKD and hypothyroidism  - suspect these factors are all combining to cause the reaccumulation of pleural fluid immediately after each thoracentesis.     Anemia with stool occult blood positive - closely follow CBC. Patient was started on   Protonix IV and received one dose on 4/27 - we then have changed her to po Cardizem. . She has been started on Coumadin during admission in 05/2012. Prior to that admission hemoglobin was around 10.5 (noted in 04/2012). She seems to have had a slow but steady drop for the past few months - currently she is at 7.5 and holding steady. Has not had a workup for anemia since 2010 - will order . Will also consult Dr. Randa Evens with [facility] . Suspect her anemia is combination of CKD, thyroid ds, chronic GI loss.  Anemia panel from 4/28 indicates combination of iron deficiency and chronic disease anemia. Iv Iron prescribed on 4/29.   Hx of A. fib presently in NSR  - patient is on Cardizem we'll continue that. She is now off amiodarone. Coumadin on hold for now.    Hypothyroidism -   TSH revealing uncontrolled hypothyroidism TSH 11.472.  Synthroid increased from 137 mcg to 150 mcg daily on 4/28 (maybe she decompensated due to recent amiodarone use).   Chronic kidney disease - closely follow intake output and metabolic panel with daily weights.    History of CVA   Code Status: full Family Communication:patient  Disposition Plan: home   Consultants:  Dr. Sondra Come   PCCM  Dr. Randa Evens [facility]   Procedures:    Antibiotics:  HPI/Subjective: C/o DOE  Objective: Filed Vitals:   09/09/12 2054 09/09/12 2101 09/10/12 0136 09/10/12 0439  BP: 134/49  127/49 138/85  Pulse: 84  80 88  Temp: 97.3 F (36.3 C)  97.3 F (36.3 C) 97.3 F (36.3 C)  TempSrc: Oral  Oral Oral  Resp: 20  18 20   Height:      Weight:    74.072 kg (163 lb 4.8 oz)  SpO2: 90% 92% 95% 93%   Patient Vitals for the past 24 hrs:  BP Temp Temp src Pulse Resp SpO2 Weight  09/10/12 0439 138/85 mmHg 97.3 F (36.3 C) Oral 88 20 93 % 74.072 kg (163 lb 4.8 oz)  09/10/12 0136 127/49 mmHg 97.3 F (36.3 C) Oral 80 18 95 % -  09/09/12 2101 - - - - - 92 % -  09/09/12 2054 134/49 mmHg 97.3 F (36.3 C) Oral 84 20 90 % -  09/09/12 1813  138/45 mmHg 97.4 F (36.3 C) Oral 80 18 97 % -  09/09/12 1413 139/58 mmHg 97.2 F (36.2 C) Oral 75 18 98 % -  09/09/12 1002 127/48 mmHg - - 82 - - -     Intake/Output Summary (Last 24 hours) at 09/10/12 0806 Last data filed at 09/10/12 0440  Gross per 24 hour  Intake    683 ml  Output   1625 ml  Net   -942 ml   Filed Weights   09/08/12 2220 09/09/12 0514 09/10/12 0439  Weight: 75.388 kg (166 lb 3.2 oz) 74.118 kg (163 lb 6.4 oz) 74.072 kg (163 lb 4.8 oz)    Exam:   General:  axox3  Cardiovascular: rrr 3/6 systolic murmur   Respiratory: decreased sounds at bases   Abdomen: soft  Musculoskeletal: peripheral edema    Data Reviewed: Basic Metabolic Panel:  Recent Labs Lab 09/08/12 1823 09/09/12 0630  NA 141 142  K 4.0 3.6  CL 98 97  CO2 38* 39*  GLUCOSE 148* 64*  BUN 31* 29*  CREATININE 1.28* 1.33*  CALCIUM 9.5 9.3   Liver Function Tests:  Recent Labs Lab 09/09/12 0630  AST 24  ALT 11  ALKPHOS 93  BILITOT 0.2*  PROT 6.9  ALBUMIN 2.9*   No results found for this basename: LIPASE, AMYLASE,  in the last 168 hours No results found for this basename: AMMONIA,  in the last 168 hours CBC:  Recent Labs Lab 09/08/12 1823 09/09/12 0040 09/09/12 0630 09/10/12 0639  WBC 4.3 5.5 4.9 5.3  NEUTROABS 2.9  --  3.0  --   HGB 7.4* 7.5* 7.5* 7.4*  HCT 23.3* 23.2* 23.7* 23.7*  MCV 90.0 89.6 90.5 91.2  PLT 292 278 306 312   Cardiac Enzymes:  Recent Labs Lab 09/08/12 1823 09/09/12 0042  TROPONINI <0.30 <0.30   BNP (last 3 results)  Recent Labs  07/17/12 0957 09/02/12 1239 09/08/12 1823  PROBNP 142.0* 168.0* 707.4*   CBG: No results found for this basename: GLUCAP,  in the last 168 hours  No results found for this or any previous visit (from the past 240 hour(s)).   Studies: Dg Chest 2 View  09/08/2012  *RADIOLOGY REPORT*  Clinical Data: Short of breath.  Right-sided back pain.  Irregular heart beat.  CHEST - 2 VIEW  Comparison: 09/02/2012.   Findings: Slight increase in the right pleural effusion which now layers more dependently.  Increasing left basilar subsegmental atelectasis.  Pulmonary vascular congestion.  The findings suggest volume overload/CHF.  Cardiopericardial silhouette appears similar.  IMPRESSION: Increasing right pleural effusion, now moderate.  Associated atelectasis and increasing atelectasis at the left lung base. Pulmonary vascular congestion with cephalization of blood flow suggest volume overload.   Original Report Authenticated By: Andreas Newport, M.D.     Scheduled Meds: . beta  carotene w/minerals  1 tablet Oral BID  . cholecalciferol  1,000 Units Oral q morning - 10a  . diltiazem  180 mg Oral q morning - 10a  . ferrous sulfate  325 mg Oral QHS  . folic acid  1 mg Oral QHS  . furosemide  80 mg Oral q morning - 10a  . levothyroxine  150 mcg Oral QAC breakfast  . multivitamin with minerals  1 tablet Oral q morning - 10a  . pantoprazole  40 mg Oral Q1200  . simvastatin  10 mg Oral QHS  . sodium chloride  3 mL Intravenous Q12H  . sodium chloride  3 mL Intravenous Q12H  . traMADol  50 mg Oral BID   Continuous Infusions:   Principal Problem:   SOB (shortness of breath) Active Problems:   Dyspnea   Hypothyroidism   Pleural effusion   Chronic respiratory failure with hypoxia   Pulmonary hypertension   Acute on chronic diastolic congestive heart failure   CKD (chronic kidney disease) stage 3, GFR 30-59 ml/min   Atrial fibrillation   Anemia        Serinity Ware  [facility] Pager [phone]. If 7PM-7AM, please contact night-coverage at [email or web address], password [email or web address] 09/10/2012, 8:06 AM  LOS: 2 days

## 2012-09-10 NOTE — Progress Notes (Signed)
CRITICAL VALUE ALERT  Critical value received: CO2-40  Date of notification:09/10/12  Time of notification: 0840  Critical value read back: yes  Nurse who received alert: Junie Panning RN  MD notified (1st page): Lavera Guise  Time of first page: 0910  MD notified (2nd page):  Time of second page:  Responding MD:  Lavera Guise  Time MD responded:  0910   No new orders given

## 2012-09-10 NOTE — Progress Notes (Signed)
Subjective:  Still dyspneic but stable.  O2 sats still drop with activity  Objective:  Vital Signs in the last 24 hours: BP 138/85  Pulse 88  Temp(Src) 97.3 F (36.3 C) (Oral)  Resp 20  Ht 5\' 5"  (1.651 m)  Wt 74.072 kg (163 lb 4.8 oz)  BMI 27.17 kg/m2  SpO2 81%  Physical Exam: Elderly WF in NAD Lungs:  Rales left base, reduced breath sounds right base Cardiac:  Regular rhythm, normal S1 and S2, no S3, 2/6 systolic murmur Extremities:  trace edema present  Intake/Output from previous day: 04/28 0701 - 04/29 0700 In: 683 [P.O.:680; I.V.:3] Out: 1625 [Urine:1625] Weight Filed Weights   09/08/12 2220 09/09/12 0514 09/10/12 0439  Weight: 75.388 kg (166 lb 3.2 oz) 74.118 kg (163 lb 6.4 oz) 74.072 kg (163 lb 4.8 oz)    Lab Results: Basic Metabolic Panel:  Recent Labs  08/65/78 0630 09/10/12 0639  NA 142 140  K 3.6 4.0  CL 97 95*  CO2 39* 40*  GLUCOSE 64* 103*  BUN 29* 27*  CREATININE 1.33* 1.28*    CBC:  Recent Labs  09/08/12 1823  09/09/12 0630 09/10/12 0639  WBC 4.3  < > 4.9 5.3  NEUTROABS 2.9  --  3.0  --   HGB 7.4*  < > 7.5* 7.4*  HCT 23.3*  < > 23.7* 23.7*  MCV 90.0  < > 90.5 91.2  PLT 292  < > 306 312  < > = values in this interval not displayed.  BNP    Component Value Date/Time   PROBNP 707.4* 09/08/2012 1823    PROTIME: Lab Results  Component Value Date   INR 1.70* 09/10/2012   INR 2.15* 09/09/2012   INR 2.28* 09/08/2012    Telemetry: Sinus with first degree block and PVC's  Assessment/Plan:  1. Chronic diastolic CHF due to valvular heart disease and LVH worsened by reduction in diuretic dose and anemia 2. Severe anemia 3. Stage 3 CKD 4. Mitral and aortic valve disease  Rec:  While in house IV diuresis.  Await ECHO.  Consider transfusion.Thoracentesis.   Darden Palmer  MD [facility]  09/10/2012, 8:55 AM

## 2012-09-10 NOTE — Plan of Care (Signed)
Problem: Phase I Progression Outcomes Goal: EF % per last Echo/documented,Core Reminder form on chart Outcome: Progressing Pt EF 55% per echo on 04/16/12, plan for possible Echo this admission Goal: Up in chair, BRP Outcome: Completed/Met Date Met:  09/10/12 Pt up to [facility] with 1 asst.

## 2012-09-11 ENCOUNTER — Inpatient Hospital Stay (HOSPITAL_COMMUNITY): Payer: Medicare Other

## 2012-09-11 DIAGNOSIS — N183 Chronic kidney disease, stage 3 (moderate): Secondary | ICD-10-CM

## 2012-09-11 DIAGNOSIS — J9610 Chronic respiratory failure, unspecified whether with hypoxia or hypercapnia: Secondary | ICD-10-CM

## 2012-09-11 DIAGNOSIS — I2789 Other specified pulmonary heart diseases: Secondary | ICD-10-CM

## 2012-09-11 LAB — BASIC METABOLIC PANEL
BUN: 32 mg/dL — ABNORMAL HIGH (ref 6–23)
CO2: 44 mEq/L (ref 19–32)
Calcium: 9.5 mg/dL (ref 8.4–10.5)
Chloride: 93 mEq/L — ABNORMAL LOW (ref 96–112)
Creatinine, Ser: 1.73 mg/dL — ABNORMAL HIGH (ref 0.50–1.10)
GFR calc Af Amer: 30 mL/min — ABNORMAL LOW (ref >90)

## 2012-09-11 LAB — CBC
HCT: 25.3 % — ABNORMAL LOW (ref 36.0–46.0)
MCH: 28.8 pg (ref 26.0–34.0)
MCV: 91 fL (ref 78.0–100.0)
RDW: 16.3 % — ABNORMAL HIGH (ref 11.5–15.5)
WBC: 6 10*3/uL (ref 4.0–10.5)

## 2012-09-11 MED ORDER — FUROSEMIDE 40 MG PO TABS
40.0000 mg | ORAL_TABLET | Freq: Two times a day (BID) | ORAL | Status: DC
  Administered 2012-09-11 – 2012-09-13 (×4): 40 mg via ORAL
  Filled 2012-09-11 (×6): qty 1

## 2012-09-11 MED ORDER — FUROSEMIDE 10 MG/ML IJ SOLN: 40.0000 mg | Freq: Three times a day (TID) | INTRAMUSCULAR | Status: DC

## 2012-09-11 NOTE — Progress Notes (Signed)
OT Cancellation Note  Patient Details Name: [name] MRN: [id number] DOB: [birth year]   Cancelled Treatment:    Reason Eval/Treat Not Completed: Medical issues which prohibited therapy(nurse recommending to hold off on therapy until later today or tomorrow.  MD will give pt lasix to determine if this helps dizziness and drop in BP  Turner, BrittanyOTAS 09/11/2012, 9:19 AM  Ignacia Palma, OTR/L [phone] 09/11/2012

## 2012-09-11 NOTE — Progress Notes (Signed)
PULMONARY  / CRITICAL CARE MEDICINE  Name: [name] MRN: [id number] DOB: 08-28-1924    ADMISSION DATE:  09/08/2012 CONSULTATION DATE:  09/08/2012   REFERRING MD :  Lonia Blood  CHIEF COMPLAINT:  Dyspnea  BRIEF PATIENT DESCRIPTION:  79 yo female former smoker admitted with progressive dyspnea and cough.  She has hx of recurrent right pleural effusion s/p multiple thoracentesis procedures.  PCCM consulted to assess dyspnea, and recurrent right pleural effusion first noted October 2013.  She is followed by Dr. Sherene Sires in office.  STUDIES:  02/21/12 Spirometry >> FEV1 0.67 (41%), FEV1% 64 04/16/12 Echo >> mod LVH, EF 55%, mild AR, mod MR, mod LA dilation, PAS 44 mmHg 04/18/12 Rt thoracentesis >> protein 2.5, LDH 76, WBC 575 (1N, 80L, 42M), small lymphocytes on cytology 05/31/12 Rt thoracentesis >> protein 2.9, LDH 101, WBC 465 (1N, 80L, 59M, 1E), small lymphocytes on cytology >> flow cytometry negative 09/10/12 Rt thoracentesis >> glucose 124, protein 2.5, LDH 90, WBC 455 (1N, 85L, 38M, 1E)  SUBJECTIVE:  Breathing better.  Denies chest pain.  VITAL SIGNS: Temp:  [97.2 F (36.2 C)-97.7 F (36.5 C)] 97.6 F (36.4 C) (04/30 0616) Pulse Rate:  [71-79] 79 (04/30 0616) Resp:  [18-19] 18 (04/30 0616) BP: (108-143)/(31-53) 120/49 mmHg (04/30 0616) SpO2:  [97 %-99 %] 97 % (04/30 0616) Weight:  [158 lb 9.6 oz (71.94 kg)] 158 lb 9.6 oz (71.94 kg) (04/30 0559) 3 liters [place]  PHYSICAL EXAMINATION: General: No distress Neuro: Alert, follows commands HEENT:  No sinus tenderness Cardiovascular:  Irregular, 2/6 SM  Lungs: decreased BS Rt base, no wheeze Abdomen: soft, non tender Musculoskeletal: no edema Skin: no rashes   Recent Labs Lab 09/09/12 0630 09/10/12 0639 09/11/12 0407  NA 142 140 140  K 3.6 4.0 4.2  CL 97 95* 93*  CO2 39* 40* 44*  BUN 29* 27* 32*  CREATININE 1.33* 1.28* 1.73*  GLUCOSE 64* 103* 90    Recent Labs Lab 09/09/12 0630 09/10/12 0639 09/11/12 0407  HGB 7.5*  7.4* 8.0*  HCT 23.7* 23.7* 25.3*  WBC 4.9 5.3 6.0  PLT 306 312 333   Dg Chest 2 View  09/11/2012  *RADIOLOGY REPORT*  Clinical Data: Right pleural effusion  CHEST - 2 VIEW  Comparison: September 10, 2012.  Findings: Bilateral pleural effusions are again noted with right greater than left.  These are not significantly changed compared to prior exam.  Slightly increased right basilar opacity is noted concerning for edema or pneumonia.  No pneumothorax is noted.  Bony thorax is intact.  IMPRESSION: Stable bilateral pleural effusions with right greater than left. Slightly increased right lower lobe opacity is noted concerning for edema or possibly pneumonia.   Original Report Authenticated By: Lupita Raider.,  M.D.    Dg Chest Port 1 View  09/10/2012  *RADIOLOGY REPORT*  Clinical Data: Follow-up right thoracentesis.  PORTABLE CHEST - 1 VIEW  Comparison: 09/08/2012  Findings: Decreasing right pleural effusion following thoracentesis.  No pneumothorax.  Small right pleural effusion with right lower lobe airspace disease persists.  Stable left basilar atelectasis or scarring.  Underlying COPD.  IMPRESSION: Decreasing right pleural effusion and right lower lobe airspace disease following thoracentesis.  No pneumothorax.   Original Report Authenticated By: Charlett Nose, M.D.     Lab Results  Component Value Date   INR 1.70* 09/10/2012   INR 2.15* 09/09/2012   INR 2.28* 09/08/2012    ASSESSMENT / PLAN:  Progressive dyspnea with recurrent transudate Rt  pleural effusion.   Previous fluid analysis has shown benign, transudate.  Has significant cardiac disease, but no improvement with diuresis.  No hx of renal or liver disease. Plan: -f/u pleural fluid cytology results from 4/29 -may be beneficial to consider pleur-x catheter for symptomatic management of effusion depending on repeat fluid analysis  Former smoker with hx of COPD. Plan: -PRN bronchodilators  Acute on chronic hypoxia. Plan: -adjust oxygen  to keep SpO2 > 92%  Hx of A fib, valvular heart disease. Plan: -per primary team -coumadin held due to possible GI bleeding  Anemia. Plan: -per primary team and GI  Coralyn Helling, MD [facility] Pulmonary/Critical Care 09/11/2012, 10:49 AM Pager:  [phone] After 3pm call: [phone]

## 2012-09-11 NOTE — Progress Notes (Signed)
Subjective:  Weight down significantly and Cr is up.  Not as SOB.  Thoracentesis yesterday of 1500 cc.  Still some exercise induced hypoxemia  Objective:  Vital Signs in the last 24 hours: BP 109/56  Pulse 72  Temp(Src) 97.5 F (36.4 C) (Oral)  Resp 20  Ht 5\' 5"  (1.651 m)  Wt 71.94 kg (158 lb 9.6 oz)  BMI 26.39 kg/m2  SpO2 96%  Physical Exam: Elderly WF in NAD Lungs:  Rales left base, reduced breath sounds right base Cardiac:  Regular rhythm, normal S1 and S2, no S3, 2/6 systolic murmur Extremities:  trace edema present  Intake/Output from previous day: 04/29 0701 - 04/30 0700 In: 1083 [P.O.:1080; I.V.:3] Out: 2415 [Urine:2415] Weight Filed Weights   09/09/12 0514 09/10/12 0439 09/11/12 0559  Weight: 74.118 kg (163 lb 6.4 oz) 74.072 kg (163 lb 4.8 oz) 71.94 kg (158 lb 9.6 oz)    Lab Results: Basic Metabolic Panel:  Recent Labs  40/98/11 0639 09/11/12 0407  NA 140 140  K 4.0 4.2  CL 95* 93*  CO2 40* 44*  GLUCOSE 103* 90  BUN 27* 32*  CREATININE 1.28* 1.73*    CBC:  Recent Labs  09/08/12 1823  09/09/12 0630 09/10/12 0639 09/11/12 0407  WBC 4.3  < > 4.9 5.3 6.0  NEUTROABS 2.9  --  3.0  --   --   HGB 7.4*  < > 7.5* 7.4* 8.0*  HCT 23.3*  < > 23.7* 23.7* 25.3*  MCV 90.0  < > 90.5 91.2 91.0  PLT 292  < > 306 312 333  < > = values in this interval not displayed.  BNP    Component Value Date/Time   PROBNP 707.4* 09/08/2012 1823    PROTIME: Lab Results  Component Value Date   INR 1.70* 09/10/2012   INR 2.15* 09/09/2012   INR 2.28* 09/08/2012    Telemetry: Sinus with first degree block and PVC's  Assessment/Plan:  1. Chronic diastolic CHF due to valvular heart disease and LVH worsened by reduction in diuretic dose and anemia Currently is better and I suspect is euvolemic now. 2. Severe anemia 3. Stage 3 CKD 4. Mitral and aortic valve disease  Rec:  Change to po diuretics.  Check repeat BNP.  Await correction of anemia. Check  Orthostatics.Darden Palmer  MD [facility] Cardiology  09/11/2012, 3:17 PM

## 2012-09-11 NOTE — Progress Notes (Signed)
Physical Therapy Treatment Patient Details Name: [name] MRN: [id number] DOB: 1924/06/15 Today's Date: 09/11/2012 Time: 6295-2841 PT Time Calculation (min): 32 min  PT Assessment / Plan / Recommendation Comments on Treatment Session  Pt's 02 sats remained >90% on 3L 02 but pt c/o feeling "faint" & "room turning black" with short distance from bed>sink however, she was able to ambulate recliner<>bathroom at end of session with no reports of faintness.  RN was made aware.        Follow Up Recommendations  No PT follow up;Supervision/Assistance - 24 hour     Does the patient have the potential to tolerate intense rehabilitation     Barriers to Discharge        Equipment Recommendations  None recommended by PT    Recommendations for Other Services    Frequency Min 2X/week   Plan Discharge plan remains appropriate    Precautions / Restrictions Precautions Precautions: Fall Restrictions Weight Bearing Restrictions: No    Pertinent Vitals/Pain BP:  Sitting after ambulation 10':  103/40.           Standing for 2 mins:  84/22  02 sats:   >90% 3L 02 with ambulating bed<>sink               Mid 80's RA with ambulating recliner<>bathroom    Mobility  Bed Mobility Bed Mobility: Supine to Sit;Sitting - Scoot to Edge of Bed Supine to Sit: 4: Min guard;HOB flat;With rails Sitting - Scoot to Edge of Bed: 5: Supervision Transfers Transfers: Sit to Stand;Stand to Sit Sit to Stand: 4: Min guard;With upper extremity assist;With armrests;From bed;From chair/3-in-1;From toilet Stand to Sit: 4: Min guard;With upper extremity assist;With armrests;To chair/3-in-1;To toilet Details for Transfer Assistance: Performed sit<>stand 5x's from recliner for strengthening & activity tolerance.  Pt demonstrates safe technique.   Ambulation/Gait Ambulation/Gait Assistance: 4: Min guard Ambulation Distance (Feet): 60 Feet (10' x 2 + 15' x 2) Assistive device: Rolling walker Ambulation/Gait Assistance  Details: Pt ambulated with RW to sink then she began to c/o she felt "faint" & assisted pt to sit in chair.  BP 103/40 sitting.  Symptoms resolved therefore had pt stand.  Pt began to feel faint again. BP 84/22 while standing.  Pt with seated rest break to allow symptoms to resolve & then assisted back to recliner.   After getting positioned in recliner pt requesting to use bathroom & wanting to ambulate to bathroom rather than use BSC.  No c/o faintness with distance to<>from bathroom but 02 sats did drop to 84% RA.    Gait Pattern: Step-to pattern;Decreased stride length (decreased step height) Stairs: No Wheelchair Mobility Wheelchair Mobility: No      PT Goals Acute Rehab PT Goals Time For Goal Achievement: 09/17/12 Potential to Achieve Goals: Good Pt will go Supine/Side to Sit: Independently;with HOB 0 degrees PT Goal: Supine/Side to Sit - Progress: Progressing toward goal Pt will go Sit to Stand: Independently;with upper extremity assist PT Goal: Sit to Stand - Progress: Progressing toward goal Pt will Ambulate: >150 feet;with modified independence;with least restrictive assistive device PT Goal: Ambulate - Progress: Progressing toward goal Pt will Go Up / Down Stairs: 3-5 stairs;with supervision;with rail(s)  Visit Information  Last PT Received On: 09/11/12 Assistance Needed: +1    Subjective Data  Patient Stated Goal: home   Cognition  Cognition Arousal/Alertness: Awake/alert Behavior During Therapy: WFL for tasks assessed/performed Overall Cognitive Status: Within Functional Limits for tasks assessed    Balance  End of Session PT - End of Session Equipment Utilized During Treatment: Gait belt;Oxygen Activity Tolerance:  (decreased BP ) Patient left: in chair;with call bell/phone within reach;with nursing in room Nurse Communication: Mobility status (Pt's BP)     Verdell Face, PTA [phone] 09/11/2012

## 2012-09-11 NOTE — Progress Notes (Signed)
[facility] PROGRESS NOTE Subjective  we are seeing the patient for anemia and tracing positive stool. In addition to this, she is having problems with congestive heart failure and recurrent pleural effusion requiring repetitive thoracentesis. She also has a fib and her Coumadin is on hold. To these issues the patient has chronic hypoxia requiring chronic 02.  Objective: Vital signs in last 24 hours: Temp:  [97.2 F (36.2 C)-97.7 F (36.5 C)] 97.6 F (36.4 C) (04/30 0616) Pulse Rate:  [71-79] 79 (04/30 0616) Resp:  [18-19] 18 (04/30 0616) BP: (120-143)/(36-53) 120/49 mmHg (04/30 0616) SpO2:  [97 %-99 %] 97 % (04/30 0616) Weight:  [71.94 kg (158 lb 9.6 oz)] 71.94 kg (158 lb 9.6 oz) (04/30 0559) Last BM Date: 09/08/12  Intake/Output from previous day: 04/29 0701 - 04/30 0700 In: 1083 [P.O.:1080; I.V.:3] Out: 2415 [Urine:2415] Intake/Output this shift: Total I/O In: 240 [P.O.:240] Out: 200 [Urine:200]  PE: General-- patient will O2 cannula Heart-- Lungs--diminish breath sounds bilaterally Abdomen-- soft and nontender  Lab Results:  Recent Labs  09/08/12 1823 09/09/12 0040 09/09/12 0630 09/10/12 0639 09/11/12 0407  WBC 4.3 5.5 4.9 5.3 6.0  HGB 7.4* 7.5* 7.5* 7.4* 8.0*  HCT 23.3* 23.2* 23.7* 23.7* 25.3*  PLT 292 278 306 312 333   BMET  Recent Labs  09/08/12 1823 09/09/12 0630 09/10/12 0639 09/11/12 0407  NA 141 142 140 140  K 4.0 3.6 4.0 4.2  CL 98 97 95* 93*  CO2 38* 39* 40* 44*  CREATININE 1.28* 1.33* 1.28* 1.73*   LFT  Recent Labs  09/09/12 0630  PROT 6.9  AST 24  ALT 11  ALKPHOS 93  BILITOT 0.2*   PT/INR  Recent Labs  09/08/12 1823 09/09/12 0630 09/10/12 0639  LABPROT 24.1* 23.1* 19.4*  INR 2.28* 2.15* 1.70*   PANCREAS No results found for this basename: LIPASE,  in the last 72 hours       Studies/Results: Dg Chest 2 View  09/11/2012  *RADIOLOGY REPORT*  Clinical Data: Right pleural effusion  CHEST - 2 VIEW   Comparison: September 10, 2012.  Findings: Bilateral pleural effusions are again noted with right greater than left.  These are not significantly changed compared to prior exam.  Slightly increased right basilar opacity is noted concerning for edema or pneumonia.  No pneumothorax is noted.  Bony thorax is intact.  IMPRESSION: Stable bilateral pleural effusions with right greater than left. Slightly increased right lower lobe opacity is noted concerning for edema or possibly pneumonia.   Original Report Authenticated By: Lupita Raider.,  M.D.    Dg Chest Port 1 View  09/10/2012  *RADIOLOGY REPORT*  Clinical Data: Follow-up right thoracentesis.  PORTABLE CHEST - 1 VIEW  Comparison: 09/08/2012  Findings: Decreasing right pleural effusion following thoracentesis.  No pneumothorax.  Small right pleural effusion with right lower lobe airspace disease persists.  Stable left basilar atelectasis or scarring.  Underlying COPD.  IMPRESSION: Decreasing right pleural effusion and right lower lobe airspace disease following thoracentesis.  No pneumothorax.   Original Report Authenticated By: Charlett Nose, M.D.     Medications: I have reviewed the patient's current medications.  Assessment/Plan: 1. Anemia with heme positive stool. Patient's hemoglobin stable and there's no sign of gross G.I. bleeding. With all of her other medical problems I would not recommend endoscopic evaluation at this time but would continue her on PPI therapy. If her medical condition improves and her stools remain positive we could consider endoscopic evaluation at that time.  Kaida Games JR,Javonna Balli L 09/11/2012, 1:31 PM

## 2012-09-11 NOTE — Progress Notes (Signed)
[facility] PROGRESS NOTE  [name] ZOX:[id number] DOB: 11/12/1924 DOA: 09/08/2012 PCP: [name], MD  Assessment/Plan: 1. CHF with h/o of diastolic CHF, mitral stenosis and mitral regurgitation with last EF measured at 55 percent. - Cardiology on board and managing.  Will defer management to them and follow up with their recommendations. - IV diuretics have been ordered and currently awaiting Echocardiogram results. Thoracentesis performed.  2. Recurrent transudative pleural effusions (first mentioned in 2010 on a CT scan of the chest)  Last time fluid was sent for analysis 05/2012 it was conclusive transudate  - patient has diastolic dysfunction, pulm HTN, low albumin, CKD and hypothyroidism - suspect these factors are all combining to cause the reaccumulation of pleural fluid immediately after each thoracentesis - Pulmonologist on board and making further recommendations.  3. Anemia with stool occult blood positive - Gi on board and following. - Recommends continuing PPI.  No plans for EGD at this juncture  4. H/o Afib - Plan on continuing Cardizem. Hold coumadin given # 3  5. Hypothyroidism - TSH recently increased with last check showing TSH level of 11.472.  Synthroid increased from 137 mcg to 150 mcg daily.  Will continue to monitor - Once discharged will need to get TSH levels rechecked in 6 wks  6. CKD - Continue to monitor daily serum creatinines. - creatinin likely increasing due to lasix administration.  Code Status: full Family Communication: No family at bedside. Disposition Plan: With improvement in medical condition.  Patient reportedly had some dizziness and decrease with standing.  Discussed with nursing holding next lasix dose.   Consultants:  Pulm  GI  Cardiology  Procedures:  2 D echocardiogram  Antibiotics:  none  HPI/Subjective: No acute issues reported overnight.  Reportedly patient had syncope with standing when trying to work  with Physical therapy today.  No new complaints otherwise.  Objective: Filed Vitals:   09/10/12 1500 09/10/12 2111 09/11/12 0559 09/11/12 0616  BP: 126/36 143/53  120/49  Pulse: 71 76  79  Temp: 97.2 F (36.2 C) 97.7 F (36.5 C)  97.6 F (36.4 C)  TempSrc: Oral Oral  Oral  Resp: 19 18  18   Height:      Weight:   71.94 kg (158 lb 9.6 oz)   SpO2: 97% 99%  97%    Intake/Output Summary (Last 24 hours) at 09/11/12 1243 Last data filed at 09/11/12 0840  Gross per 24 hour  Intake    960 ml  Output   1740 ml  Net   -780 ml   Filed Weights   09/09/12 0514 09/10/12 0439 09/11/12 0559  Weight: 74.118 kg (163 lb 6.4 oz) 74.072 kg (163 lb 4.8 oz) 71.94 kg (158 lb 9.6 oz)    Exam:   General:  Pt in NAD, Alert and Awake  Cardiovascular: S1 and S2 WNL, no rubs  Respiratory: no increased work of breathing, no wheezes, Rhales BL at bases  Abdomen: soft, NT, ND  Musculoskeletal: no cyanosis or clubbing   Data Reviewed: Basic Metabolic Panel:  Recent Labs Lab 09/08/12 1823 09/09/12 0630 09/10/12 0639 09/11/12 0407  NA 141 142 140 140  K 4.0 3.6 4.0 4.2  CL 98 97 95* 93*  CO2 38* 39* 40* 44*  GLUCOSE 148* 64* 103* 90  BUN 31* 29* 27* 32*  CREATININE 1.28* 1.33* 1.28* 1.73*  CALCIUM 9.5 9.3 9.4 9.5   Liver Function Tests:  Recent Labs Lab 09/09/12 0630  AST 24  ALT 11  ALKPHOS 93  BILITOT 0.2*  PROT 6.9  ALBUMIN 2.9*   No results found for this basename: LIPASE, AMYLASE,  in the last 168 hours No results found for this basename: AMMONIA,  in the last 168 hours CBC:  Recent Labs Lab 09/08/12 1823 09/09/12 0040 09/09/12 0630 09/10/12 0639 09/11/12 0407  WBC 4.3 5.5 4.9 5.3 6.0  NEUTROABS 2.9  --  3.0  --   --   HGB 7.4* 7.5* 7.5* 7.4* 8.0*  HCT 23.3* 23.2* 23.7* 23.7* 25.3*  MCV 90.0 89.6 90.5 91.2 91.0  PLT 292 278 306 312 333   Cardiac Enzymes:  Recent Labs Lab 09/08/12 1823 09/09/12 0042  TROPONINI <0.30 <0.30   BNP (last 3  results)  Recent Labs  07/17/12 0957 09/02/12 1239 09/08/12 1823  PROBNP 142.0* 168.0* 707.4*   CBG: No results found for this basename: GLUCAP,  in the last 168 hours  Recent Results (from the past 240 hour(s))  BODY FLUID CULTURE     Status: None   Collection Time    09/10/12 10:23 AM      Result Value Range Status   Specimen Description PLEURAL FLUID RIGHT   Final   Special Requests 7.0ML FLUID   Final   Gram Stain PENDING   Incomplete   Culture NO GROWTH 1 DAY   Final   Report Status PENDING   Incomplete     Studies: Dg Chest 2 View  09/11/2012  *RADIOLOGY REPORT*  Clinical Data: Right pleural effusion  CHEST - 2 VIEW  Comparison: September 10, 2012.  Findings: Bilateral pleural effusions are again noted with right greater than left.  These are not significantly changed compared to prior exam.  Slightly increased right basilar opacity is noted concerning for edema or pneumonia.  No pneumothorax is noted.  Bony thorax is intact.  IMPRESSION: Stable bilateral pleural effusions with right greater than left. Slightly increased right lower lobe opacity is noted concerning for edema or possibly pneumonia.   Original Report Authenticated By: Lupita Raider.,  M.D.    Dg Chest Port 1 View  09/10/2012  *RADIOLOGY REPORT*  Clinical Data: Follow-up right thoracentesis.  PORTABLE CHEST - 1 VIEW  Comparison: 09/08/2012  Findings: Decreasing right pleural effusion following thoracentesis.  No pneumothorax.  Small right pleural effusion with right lower lobe airspace disease persists.  Stable left basilar atelectasis or scarring.  Underlying COPD.  IMPRESSION: Decreasing right pleural effusion and right lower lobe airspace disease following thoracentesis.  No pneumothorax.   Original Report Authenticated By: Charlett Nose, M.D.     Scheduled Meds: . beta carotene w/minerals  1 tablet Oral BID  . cholecalciferol  1,000 Units Oral q morning - 10a  . diltiazem  180 mg Oral q morning - 10a  . ferrous  sulfate  325 mg Oral QHS  . folic acid  1 mg Oral QHS  . furosemide  40 mg Intravenous Q8H  . levothyroxine  150 mcg Oral QAC breakfast  . multivitamin with minerals  1 tablet Oral q morning - 10a  . pantoprazole  40 mg Oral Q1200  . simvastatin  10 mg Oral QHS  . sodium chloride  3 mL Intravenous Q12H  . sodium chloride  3 mL Intravenous Q12H  . traMADol  50 mg Oral BID   Continuous Infusions:   Active Problems:   Dyspnea   Hypothyroidism   Pleural effusion   Chronic respiratory failure with hypoxia   Pulmonary hypertension   Acute on chronic  diastolic congestive heart failure   CKD (chronic kidney disease) stage 3, GFR 30-59 ml/min   Atrial fibrillation   Anemia    Time spent: > 35 minutes    Penny Pia  [facility] Pager [phone] If 7PM-7AM, please contact night-coverage at [email or web address], password [facility] 09/11/2012, 12:43 PM  LOS: 3 days

## 2012-09-11 NOTE — Progress Notes (Signed)
CO2 44 this AM. CO2 40 on 4/29 AM. MD paged. Will continue to monitor. Baron Hamper, RN 09/11/2012

## 2012-09-12 DIAGNOSIS — E039 Hypothyroidism, unspecified: Secondary | ICD-10-CM

## 2012-09-12 DIAGNOSIS — I2510 Atherosclerotic heart disease of native coronary artery without angina pectoris: Secondary | ICD-10-CM

## 2012-09-12 DIAGNOSIS — I119 Hypertensive heart disease without heart failure: Secondary | ICD-10-CM

## 2012-09-12 DIAGNOSIS — I5032 Chronic diastolic (congestive) heart failure: Secondary | ICD-10-CM

## 2012-09-12 LAB — BASIC METABOLIC PANEL
CO2: 42 mEq/L (ref 19–32)
Calcium: 9.4 mg/dL (ref 8.4–10.5)
Creatinine, Ser: 1.7 mg/dL — ABNORMAL HIGH (ref 0.50–1.10)
GFR calc non Af Amer: 26 mL/min — ABNORMAL LOW (ref >90)
Glucose, Bld: 108 mg/dL — ABNORMAL HIGH (ref 70–99)

## 2012-09-12 LAB — CBC
HCT: 29.2 % — ABNORMAL LOW (ref 36.0–46.0)
Platelets: 319 10*3/uL (ref 150–400)
RBC: 3.28 MIL/uL — ABNORMAL LOW (ref 3.87–5.11)
RDW: 16.7 % — ABNORMAL HIGH (ref 11.5–15.5)
WBC: 6.4 10*3/uL (ref 4.0–10.5)

## 2012-09-12 LAB — PREPARE RBC (CROSSMATCH)

## 2012-09-12 NOTE — Progress Notes (Signed)
I have reviewed and agree with this note.  Cathy Ardis Lawley, OTR/L [phone] 09/12/2012   

## 2012-09-12 NOTE — Progress Notes (Signed)
Subjective:  Breathing is better.  Weight down from admission.  BNP lower.  Still hypoxemic with activity.  Objective:  Vital Signs in the last 24 hours: BP 118/48  Pulse 95  Temp(Src) 98 F (36.7 C) (Oral)  Resp 17  Ht 5\' 5"  (1.651 m)  Wt 72.394 kg (159 lb 9.6 oz)  BMI 26.56 kg/m2  SpO2 99%  Physical Exam: Elderly WF in NAD Lungs:  Rales both bases Cardiac:  Regular rhythm, normal S1 and S2, no S3, 3/6 systolic murmur Extremities:  trace edema present  Intake/Output from previous day: 04/30 0701 - 05/01 0700 In: 960 [P.O.:960] Out: 1100 [Urine:1100] Weight Filed Weights   09/10/12 0439 09/11/12 0559 09/12/12 0606  Weight: 74.072 kg (163 lb 4.8 oz) 71.94 kg (158 lb 9.6 oz) 72.394 kg (159 lb 9.6 oz)    Lab Results: Basic Metabolic Panel:  Recent Labs  81/19/14 0407 09/12/12 0500  NA 140 139  K 4.2 4.0  CL 93* 90*  CO2 44* 42*  GLUCOSE 90 108*  BUN 32* 36*  CREATININE 1.73* 1.70*    CBC:  Recent Labs  09/10/12 0639 09/11/12 0407  WBC 5.3 6.0  HGB 7.4* 8.0*  HCT 23.7* 25.3*  MCV 91.2 91.0  PLT 312 333    BNP    Component Value Date/Time   PROBNP 530.7* 09/12/2012 0500    PROTIME: Lab Results  Component Value Date   INR 1.70* 09/10/2012   INR 2.15* 09/09/2012   INR 2.28* 09/08/2012    Telemetry: Sinus with first degree block and PVC's  Assessment/Plan:  1. Chronic diastolic CHF due to valvular heart disease and LVH worsened by reduction in diuretic dose and anemia Currently is better and I suspect is euvolemic now. 2. Severe anemia 3. Stage 3 CKD 4. Mitral and aortic valve disease 5.  Not entirely clear that effusions are all CHF, may have some sort of pulmonary process going on  Rec:  Needs to correct anemia, back on po diuretics.  Watch weight and output.  Darden Palmer  MD [facility]  09/12/2012, 9:43 AM

## 2012-09-12 NOTE — Progress Notes (Deleted)
Occupational Therapy Treatment Patient Details Name: [name] MRN: [id number] DOB: 06/16/1924 Today's Date: 09/12/2012 Time: 2130-8657 OT Time Calculation (min): 29 min  OT Assessment / Plan / Recommendation Comments on Treatment Session Upon entering room pt had O2 out.  Pt was 90% on RA. Pt was limited this session by BP dropping showing orthostatic symptoms. Pt was only able to get from bed to recliner this session due to BP. PTA and OTAS communicated with RN regarding O2 RN stated she would follow up with MD.    Follow Up Recommendations  No OT follow up             Frequency Min 2X/week   Plan Discharge plan remains appropriate    Precautions / Restrictions Precautions Precautions: Fall Precaution Comments: pt on 3 LO2 via [place], decreased SpO2 with all mobility Restrictions Weight Bearing Restrictions: No       ADL  Toilet Transfer: Simulated;Min guard Toilet Transfer Method: Stand pivot Acupuncturist: Other (comment) (bed>recliner) Equipment Used: Gait belt;Rolling walker Transfers/Ambulation Related to ADLs: Pt min guard A for ambulation from EOB>recliner ADL Comments: Pt limited by BP dropping      OT Goals ADL Goals ADL Goal: Statistician - Progress: Not progressing (same as eval due to medical issues) Miscellaneous OT Goals OT Goal: Miscellaneous Goal #2 - Progress: Progressing toward goals  Visit Information  Last OT Received On: 09/12/12 Assistance Needed: +1 PT/OT Co-Evaluation/Treatment: Yes    Subjective Data  Subjective: [name] is going to stay with me when I leave [facility]      Cognition  Cognition Arousal/Alertness: Awake/alert Behavior During Therapy: WFL for tasks assessed/performed Overall Cognitive Status: Within Functional Limits for tasks assessed    Mobility  Bed Mobility Bed Mobility: Supine to Sit;Sitting - Scoot to Edge of Bed Supine to Sit: 4: Min guard;With rails;HOB flat Sitting - Scoot to Edge of Bed: 4: Min  guard Details for Bed Mobility Assistance: Pt needed no VCs for bed mobility Transfers Transfers: Sit to Stand;Stand to Sit Sit to Stand: 4: Min guard;From bed;From elevated surface;With upper extremity assist Stand to Sit: 4: Min guard;With upper extremity assist;With armrests;To chair/3-in-1 Details for Transfer Assistance: 1 VC to keep rw with her as turning to sit and to have proper hand placement for stand>sit       Balance Balance Balance Assessed: No   End of Session OT - End of Session Equipment Utilized During Treatment: Gait belt Activity Tolerance: Treatment limited secondary to medical complications (Comment) (BP dropping(orthostaic symptoms) Patient left: in chair;with call bell/phone within reach;with nursing in room Nurse Communication: Mobility status;Other (comment) (BP dropping with mobility)       Nilesh Stegall, [place] 09/12/2012, 1:50 PM

## 2012-09-12 NOTE — Progress Notes (Signed)
[facility] PROGRESS NOTE Subjective patient breathing a little better. Hemoglobin is stable. No gross G.I. bleeding.  Objective: Vital signs in last 24 hours: Temp:  [97.5 F (36.4 C)-98.2 F (36.8 C)] 98 F (36.7 C) (05/01 0606) Pulse Rate:  [72-95] 95 (05/01 0616) Resp:  [16-20] 17 (05/01 0606) BP: (104-143)/(27-56) 104/27 mmHg (05/01 0616) SpO2:  [96 %-100 %] 99 % (05/01 0606) Weight:  [72.394 kg (159 lb 9.6 oz)] 72.394 kg (159 lb 9.6 oz) (05/01 0606) Last BM Date: 09/11/12  Intake/Output from previous day: 04/30 0701 - 05/01 0700 In: 960 [P.O.:960] Out: 1100 [Urine:1100] Intake/Output this shift: Total I/O In: 240 [P.O.:240] Out: -   PE:  Abdomen-- soft nontender  Lab Results:  Recent Labs  09/10/12 0639 09/11/12 0407  WBC 5.3 6.0  HGB 7.4* 8.0*  HCT 23.7* 25.3*  PLT 312 333   BMET  Recent Labs  09/10/12 0639 09/11/12 0407 09/12/12 0500  NA 140 140 139  K 4.0 4.2 4.0  CL 95* 93* 90*  CO2 40* 44* 42*  CREATININE 1.28* 1.73* 1.70*   LFT No results found for this basename: PROT, AST, ALT, ALKPHOS, BILITOT, BILIDIR, IBILI,  in the last 72 hours PT/INR  Recent Labs  09/10/12 0639  LABPROT 19.4*  INR 1.70*   PANCREAS No results found for this basename: LIPASE,  in the last 72 hours       Studies/Results: Dg Chest 2 View  09/11/2012  *RADIOLOGY REPORT*  Clinical Data: Right pleural effusion  CHEST - 2 VIEW  Comparison: September 10, 2012.  Findings: Bilateral pleural effusions are again noted with right greater than left.  These are not significantly changed compared to prior exam.  Slightly increased right basilar opacity is noted concerning for edema or pneumonia.  No pneumothorax is noted.  Bony thorax is intact.  IMPRESSION: Stable bilateral pleural effusions with right greater than left. Slightly increased right lower lobe opacity is noted concerning for edema or possibly pneumonia.   Original Report Authenticated By: Lupita Raider.,  M.D.    Dg Chest Port 1 View  09/10/2012  *RADIOLOGY REPORT*  Clinical Data: Follow-up right thoracentesis.  PORTABLE CHEST - 1 VIEW  Comparison: 09/08/2012  Findings: Decreasing right pleural effusion following thoracentesis.  No pneumothorax.  Small right pleural effusion with right lower lobe airspace disease persists.  Stable left basilar atelectasis or scarring.  Underlying COPD.  IMPRESSION: Decreasing right pleural effusion and right lower lobe airspace disease following thoracentesis.  No pneumothorax.   Original Report Authenticated By: Charlett Nose, M.D.     Medications: I have reviewed the patient's current medications.  Assessment/Plan: 1. Iron deficiency anemia/trace positive stool. Patient's hemoglobin is stable and there is no clinically obvious G.I. bleeding. At this point, I feel that she would be high risk for colonoscopy given her congestive heart failure and pulmonary complications. We will recheck her stools in the hospital, if positive consider barium enema to rule out gross lesion of the colon.   [name],[name] 09/12/2012, 8:20 AM

## 2012-09-12 NOTE — Progress Notes (Signed)
[facility] PROGRESS NOTE  [name] ZOX:[id number] DOB: 12/01/1924 DOA: 09/08/2012 PCP: [name], MD  Assessment/Plan: 1. CHF with h/o of diastolic CHF, mitral stenosis and mitral regurgitation with last EF measured at 55 percent. - Cardiology on board and managing.  Will defer management to them and follow up with their recommendations. - Currently on oral lasix regimen. Thoracentesis performed. - EF 55-60% with no regional wall motion abnormality.  2. Recurrent transudative pleural effusions (first mentioned in 2010 on a CT scan of the chest)   transudate per current work up [employer] on board and making further recommendations. - If pleural fluid accumulates rapidly patient may benefit from Pleur-x catheter - f/u with pleural fluid cytology 4/29  3. Anemia with stool occult blood positive - Gi on board and following. - Given recent reports of hemoccult possitive stool and  hgb at 8 with cardiac disease agree with transfusing 1 unit of PRBC which patient is agreeable to at this point. - Recommends continuing PPI.  No plans for EGD at this juncture  4. H/o Afib - Plan on continuing Cardizem. Hold coumadin given # 3  5. Hypothyroidism - TSH recently increased with last check showing TSH level of 11.472.  Synthroid increased from 137 mcg to 150 mcg daily.  Will continue to monitor - Once discharged will need to get TSH levels rechecked in 6 wks  6. CKD - Continue to monitor daily serum creatinines. - creatinin likely increasing due to lasix administration.  Code Status: full Family Communication: No family at bedside. Disposition Plan: With improvement in medical condition.  Lasix changed to po regimen.   Consultants:  Pulm  GI  Cardiology  Procedures:  2 D echocardiogram  Antibiotics:  none  HPI/Subjective: No acute issues reported overnight.  No new complaints at this juncture.  Objective: Filed Vitals:   09/12/12 1055 09/12/12 1056  09/12/12 1100 09/12/12 1119  BP: 128/86 84/33 114/46 112/42  Pulse:    73  Temp:    97.4 F (36.3 C)  TempSrc:    Oral  Resp:    16  Height:      Weight:      SpO2: 95%  97%     Intake/Output Summary (Last 24 hours) at 09/12/12 1144 Last data filed at 09/12/12 0940  Gross per 24 hour  Intake    960 ml  Output   1050 ml  Net    -90 ml   Filed Weights   09/10/12 0439 09/11/12 0559 09/12/12 0606  Weight: 74.072 kg (163 lb 4.8 oz) 71.94 kg (158 lb 9.6 oz) 72.394 kg (159 lb 9.6 oz)    Exam:   General:  Pt in NAD, Alert and Awake  Cardiovascular: S1 and S2 WNL, no rubs  Respiratory: no increased work of breathing, no wheezes, Rhales BL at bases  Abdomen: soft, NT, ND  Musculoskeletal: no cyanosis or clubbing   Data Reviewed: Basic Metabolic Panel:  Recent Labs Lab 09/08/12 1823 09/09/12 0630 09/10/12 0639 09/11/12 0407 09/12/12 0500  NA 141 142 140 140 139  K 4.0 3.6 4.0 4.2 4.0  CL 98 97 95* 93* 90*  CO2 38* 39* 40* 44* 42*  GLUCOSE 148* 64* 103* 90 108*  BUN 31* 29* 27* 32* 36*  CREATININE 1.28* 1.33* 1.28* 1.73* 1.70*  CALCIUM 9.5 9.3 9.4 9.5 9.4   Liver Function Tests:  Recent Labs Lab 09/09/12 0630  AST 24  ALT 11  ALKPHOS 93  BILITOT 0.2*  PROT 6.9  ALBUMIN 2.9*   No results found for this basename: LIPASE, AMYLASE,  in the last 168 hours No results found for this basename: AMMONIA,  in the last 168 hours CBC:  Recent Labs Lab 09/08/12 1823 09/09/12 0040 09/09/12 0630 09/10/12 0639 09/11/12 0407  WBC 4.3 5.5 4.9 5.3 6.0  NEUTROABS 2.9  --  3.0  --   --   HGB 7.4* 7.5* 7.5* 7.4* 8.0*  HCT 23.3* 23.2* 23.7* 23.7* 25.3*  MCV 90.0 89.6 90.5 91.2 91.0  PLT 292 278 306 312 333   Cardiac Enzymes:  Recent Labs Lab 09/08/12 1823 09/09/12 0042  TROPONINI <0.30 <0.30   BNP (last 3 results)  Recent Labs  09/02/12 1239 09/08/12 1823 09/12/12 0500  PROBNP 168.0* 707.4* 530.7*   CBG: No results found for this basename: GLUCAP,   in the last 168 hours  Recent Results (from the past 240 hour(s))  BODY FLUID CULTURE     Status: None   Collection Time    09/10/12 10:23 AM      Result Value Range Status   Specimen Description PLEURAL FLUID RIGHT   Final   Special Requests 7.0ML FLUID   Final   Gram Stain     Final   Value: RARE WBC PRESENT, PREDOMINANTLY MONONUCLEAR     NO ORGANISMS SEEN   Culture NO GROWTH 1 DAY   Final   Report Status PENDING   Incomplete     Studies: Dg Chest 2 View  09/11/2012  *RADIOLOGY REPORT*  Clinical Data: Right pleural effusion  CHEST - 2 VIEW  Comparison: September 10, 2012.  Findings: Bilateral pleural effusions are again noted with right greater than left.  These are not significantly changed compared to prior exam.  Slightly increased right basilar opacity is noted concerning for edema or pneumonia.  No pneumothorax is noted.  Bony thorax is intact.  IMPRESSION: Stable bilateral pleural effusions with right greater than left. Slightly increased right lower lobe opacity is noted concerning for edema or possibly pneumonia.   Original Report Authenticated By: Lupita Raider.,  M.D.     Scheduled Meds: . beta carotene w/minerals  1 tablet Oral BID  . cholecalciferol  1,000 Units Oral q morning - 10a  . diltiazem  180 mg Oral q morning - 10a  . ferrous sulfate  325 mg Oral QHS  . folic acid  1 mg Oral QHS  . furosemide  40 mg Oral BID  . levothyroxine  150 mcg Oral QAC breakfast  . multivitamin with minerals  1 tablet Oral q morning - 10a  . pantoprazole  40 mg Oral Q1200  . simvastatin  10 mg Oral QHS  . sodium chloride  3 mL Intravenous Q12H  . sodium chloride  3 mL Intravenous Q12H  . traMADol  50 mg Oral BID   Continuous Infusions:   Active Problems:   Dyspnea   Hypothyroidism   Pleural effusion   Chronic respiratory failure with hypoxia   Pulmonary hypertension   Acute on chronic diastolic congestive heart failure   CKD (chronic kidney disease) stage 3, GFR 30-59 ml/min    Atrial fibrillation   Anemia    Time spent: > 35 minutes    Penny Pia  [facility] Pager [phone] If 7PM-7AM, please contact night-coverage at [email or web address], password [facility] 09/12/2012, 11:44 AM  LOS: 4 days

## 2012-09-12 NOTE — Progress Notes (Signed)
Occupational Therapy Treatment Patient Details Name: YELITZA REACH MRN: [id number] DOB: [birth year] Today's Date: 09/12/2012 Time: 7829-5621 OT Time Calculation (min): 29 min  OT Assessment / Plan / Recommendation Comments on Treatment Session Upon entering room pt had O2 out.  Pt was 90% on RA. Pt was limited this session by BP dropping showing orthostatic symptoms. Pt was only able to get from bed to recliner this session due to BP. PTA and OTAS communicated with RN regarding O2 RN stated she would follow up with MD.  OTAS discussed with pt about sitting when completing task to conserve energy.    Follow Up Recommendations  No OT follow up             Frequency Min 2X/week   Plan Discharge plan remains appropriate    Precautions / Restrictions Precautions Precautions: Fall Precaution Comments: pt on 3 LO2 via [place], decreased SpO2 with all mobility Restrictions Weight Bearing Restrictions: No       ADL  Toilet Transfer: Simulated;Min guard Toilet Transfer Method: Stand pivot Acupuncturist: Other (comment) (bed>recliner) Equipment Used: Gait belt;Rolling walker Transfers/Ambulation Related to ADLs: Pt min guard A for ambulation from EOB>recliner ADL Comments: Pt limited by BP dropping     OT Goals ADL Goals ADL Goal: Toilet Transfer - Progress: Not progressing (same as eval due to medical issues) Miscellaneous OT Goals OT Goal: Miscellaneous Goal #2 - Progress: Progressing toward goals  Visit Information  Last OT Received On: 09/12/12 Assistance Needed: +1 PT/OT Co-Evaluation/Treatment: Yes    Subjective Data  Subjective: [name] is going to stay with me when I leave [facility]      Cognition  Cognition Arousal/Alertness: Awake/alert Behavior During Therapy: WFL for tasks assessed/performed Overall Cognitive Status: Within Functional Limits for tasks assessed    Mobility  Bed Mobility Bed Mobility: Supine to Sit;Sitting - Scoot to Edge of Bed Supine to  Sit: 4: Min guard;With rails;HOB flat Sitting - Scoot to Edge of Bed: 4: Min guard Details for Bed Mobility Assistance: Pt needed no VCs for bed mobility Transfers Transfers: Sit to Stand;Stand to Sit Sit to Stand: 4: Min guard;From bed;From elevated surface;With upper extremity assist Stand to Sit: 4: Min guard;With upper extremity assist;With armrests;To chair/3-in-1 Details for Transfer Assistance: 1 VC to keep rw with her as turning to sit and to have proper hand placement for stand>sit       Balance Balance Balance Assessed: No   End of Session OT - End of Session Equipment Utilized During Treatment: Gait belt Activity Tolerance: Treatment limited secondary to medical complications (Comment) (BP dropping(orthostaic symptoms) Patient left: in chair;with call bell/phone within reach;with nursing in room Nurse Communication: Mobility status;Other (comment) (BP dropping with mobility)       Jatniel Verastegui, [place] 09/12/2012, 2:06 PM

## 2012-09-12 NOTE — Progress Notes (Signed)
Physical Therapy Treatment Patient Details Name: [name] MRN: [id number] DOB: 05/08/1925 Today's Date: 09/12/2012 Time: 4098-1191 PT Time Calculation (min): 29 min  PT Assessment / Plan / Recommendation Comments on Treatment Session  Pt's mobility cont's to be limited today by orthostatic BP.  See vitals section of note.  Pt has yet to ambulate beyond 10' in therapy at this time.  Needs to increase ambulation distance & perform stairs before d/c home.      Follow Up Recommendations  No PT follow up;Supervision/Assistance - 24 hour     Does the patient have the potential to tolerate intense rehabilitation     Barriers to Discharge        Equipment Recommendations  Rolling walker with 5" wheels    Recommendations for Other Services    Frequency Min 2X/week   Plan Discharge plan remains appropriate    Precautions / Restrictions Precautions Precautions: Fall Precaution Comments: Pt on 3L 02 via [place].  Watch orthostatic BP.   Restrictions Weight Bearing Restrictions: No   Pertinent Vitals/Pain 02 sats:  91% RA upon arrival pt at rest                95% 3L 02 at rest before sitting EOB                97% 3L 02 after performing SPT  BP:  114/44 supine          128/86 sitting EOB          84/34 Standing          114/46 sitting in recliner x 2 mins.     Mobility  Bed Mobility Bed Mobility: Supine to Sit;Sitting - Scoot to Edge of Bed Supine to Sit: 5: Supervision;HOB flat Sitting - Scoot to Edge of Bed: 5: Supervision Details for Bed Mobility Assistance: Pt needed no VCs for bed mobility Transfers Transfers: Sit to Stand;Stand to Sit Sit to Stand: 5: Supervision;With upper extremity assist;From bed Stand to Sit: 5: Supervision;With upper extremity assist;With armrests;To chair/3-in-1 Details for Transfer Assistance: Cues for safe body positioning before sitting, & safe hand placement Ambulation/Gait Ambulation/Gait Assistance: Not tested (comment) (due to orthostatic BP)       PT Goals Acute Rehab PT Goals Time For Goal Achievement: 09/17/12 Potential to Achieve Goals: Good Pt will go Supine/Side to Sit: Independently;with HOB 0 degrees PT Goal: Supine/Side to Sit - Progress: Progressing toward goal Pt will go Sit to Stand: Independently;with upper extremity assist PT Goal: Sit to Stand - Progress: Progressing toward goal Pt will Ambulate: >150 feet;with modified independence;with least restrictive assistive device Pt will Go Up / Down Stairs: 3-5 stairs;with supervision;with rail(s)  Visit Information  Last PT Received On: 09/12/12 Assistance Needed: +1    Subjective Data  Patient Stated Goal: home   Cognition  Cognition Arousal/Alertness: Awake/alert Behavior During Therapy: WFL for tasks assessed/performed Overall Cognitive Status: Within Functional Limits for tasks assessed    Balance  Balance Balance Assessed: No  End of Session PT - End of Session Equipment Utilized During Treatment: Gait belt;Oxygen Activity Tolerance: Other (comment) (limited by orthostatic BP) Patient left: in chair;with call bell/phone within reach Nurse Communication: Mobility status (orthostatic BP)     Verdell Face, PTA [phone] 09/12/2012

## 2012-09-12 NOTE — Progress Notes (Signed)
Pt CO2 level 42 this AM. Yesterday CO2 level was 44. MD paged. Will continue to monitor. Baron Hamper, RN 09/12/2012 6:51 AM

## 2012-09-12 NOTE — Progress Notes (Signed)
PULMONARY  / CRITICAL CARE MEDICINE  Name: HERMINA BARNARD MRN: [id number] DOB: Jun 27, 1924    ADMISSION DATE:  09/08/2012 CONSULTATION DATE:  09/08/2012   REFERRING MD :  Lonia Blood  CHIEF COMPLAINT:  Dyspnea  BRIEF PATIENT DESCRIPTION:  79 yo female former smoker admitted with progressive dyspnea and cough.  She has hx of recurrent right pleural effusion s/p multiple thoracentesis procedures.  PCCM consulted to assess dyspnea, and recurrent right pleural effusion first noted October 2013.  She is followed by Dr. Sherene Sires in office.  STUDIES:  02/21/12 Spirometry >> FEV1 0.67 (41%), FEV1% 64 04/16/12 Echo >> mod LVH, EF 55%, mild AR, mod MR, mod LA dilation, PAS 44 mmHg 04/18/12 Rt thoracentesis >> protein 2.5, LDH 76, WBC 575 (1N, 80L, 62M), small lymphocytes on cytology 05/31/12 Rt thoracentesis >> protein 2.9, LDH 101, WBC 465 (1N, 80L, 30M, 1E), small lymphocytes on cytology >> flow cytometry negative 09/10/12 Rt thoracentesis >> glucose 124, protein 2.5, LDH 90, WBC 455 (1N, 85L, 27M, 1E); cytology >> no malignant cells, mesothelial cells present, lymphocytes present  SUBJECTIVE:  Breathing better.  Denies chest pain.  VITAL SIGNS: Temp:  [97.5 F (36.4 C)-98.2 F (36.8 C)] 98 F (36.7 C) (05/01 0606) Pulse Rate:  [72-95] 95 (05/01 0616) Resp:  [16-20] 17 (05/01 0606) BP: (104-143)/(27-56) 118/48 mmHg (05/01 0900) SpO2:  [96 %-100 %] 99 % (05/01 0606) Weight:  [72.394 kg (159 lb 9.6 oz)] 72.394 kg (159 lb 9.6 oz) (05/01 0606) 3 liters [place]  PHYSICAL EXAMINATION: General: No distress Neuro: Alert, follows commands HEENT:  No sinus tenderness Cardiovascular:  Irregular, 2/6 SM  Lungs: decreased BS Rt base, no wheeze Abdomen: soft, non tender Musculoskeletal: no edema Skin: no rashes   Recent Labs Lab 09/10/12 0639 09/11/12 0407 09/12/12 0500  NA 140 140 139  K 4.0 4.2 4.0  CL 95* 93* 90*  CO2 40* 44* 42*  BUN 27* 32* 36*  CREATININE 1.28* 1.73* 1.70*  GLUCOSE 103* 90  108*    Recent Labs Lab 09/09/12 0630 09/10/12 0639 09/11/12 0407  HGB 7.5* 7.4* 8.0*  HCT 23.7* 23.7* 25.3*  WBC 4.9 5.3 6.0  PLT 306 312 333   Dg Chest 2 View  09/11/2012  *RADIOLOGY REPORT*  Clinical Data: Right pleural effusion  CHEST - 2 VIEW  Comparison: September 10, 2012.  Findings: Bilateral pleural effusions are again noted with right greater than left.  These are not significantly changed compared to prior exam.  Slightly increased right basilar opacity is noted concerning for edema or pneumonia.  No pneumothorax is noted.  Bony thorax is intact.  IMPRESSION: Stable bilateral pleural effusions with right greater than left. Slightly increased right lower lobe opacity is noted concerning for edema or possibly pneumonia.   Original Report Authenticated By: Lupita Raider.,  M.D.     Lab Results  Component Value Date   INR 1.70* 09/10/2012   INR 2.15* 09/09/2012   INR 2.28* 09/08/2012    ASSESSMENT / PLAN:  Progressive dyspnea with recurrent transudate Rt pleural effusion.   Again the fluid is a transudate with lymphocytes. No evidence malignancy. Has significant cardiac disease, but no improvement with diuresis.  No hx of renal or liver disease. Plan: -f/u pleural fluid cytology results from 4/29 -may be beneficial to consider pleur-x catheter for symptomatic management of effusion if it quickly reaccumulates. May be able to be managed by cardiac regimen alone as she is now euvolemic.   Former smoker with hx  of COPD. Plan: -PRN bronchodilators; there are no scheduled BD's on her home medicine list.  -should follow with Dr Sherene Sires after discharge  Acute on chronic hypoxia. Plan: -adjust oxygen to keep SpO2 > 92%  Hx of A fib, valvular heart disease. Plan: -per primary team -coumadin held due to possible GI bleeding  Anemia. Plan: -per primary team and GI  Please call if we can help further.   Levy Pupa, MD, PhD 09/12/2012, 10:37 AM [facility] [phone] or if no answer [phone]

## 2012-09-13 DIAGNOSIS — E785 Hyperlipidemia, unspecified: Secondary | ICD-10-CM

## 2012-09-13 LAB — TYPE AND SCREEN
ABO/RH(D): AB POS
Antibody Screen: NEGATIVE

## 2012-09-13 LAB — BODY FLUID CULTURE: Culture: NO GROWTH

## 2012-09-13 LAB — BASIC METABOLIC PANEL
Chloride: 89 mEq/L — ABNORMAL LOW (ref 96–112)
Creatinine, Ser: 1.7 mg/dL — ABNORMAL HIGH (ref 0.50–1.10)
GFR calc Af Amer: 30 mL/min — ABNORMAL LOW (ref >90)
Potassium: 3.8 mEq/L (ref 3.5–5.1)
Sodium: 137 mEq/L (ref 135–145)

## 2012-09-13 MED ORDER — ACETAZOLAMIDE 250 MG PO TABS
250.0000 mg | ORAL_TABLET | Freq: Two times a day (BID) | ORAL | Status: DC
  Administered 2012-09-13 – 2012-09-14 (×4): 250 mg via ORAL
  Filled 2012-09-13 (×6): qty 1

## 2012-09-13 MED ORDER — FUROSEMIDE 40 MG PO TABS
40.0000 mg | ORAL_TABLET | Freq: Every day | ORAL | Status: DC
  Administered 2012-09-14: 40 mg via ORAL
  Filled 2012-09-13 (×2): qty 1

## 2012-09-13 NOTE — Progress Notes (Signed)
Subjective:  Feels better. .  NO chest pain or SOB at rest.  Still some hypoxemia.  Having some orthostasis.  Objective:  Vital Signs in the last 24 hours: BP 124/68  Pulse 81  Temp(Src) 97.6 F (36.4 C) (Oral)  Resp 20  Ht 5\' 5"  (1.651 m)  Wt 72.3 kg (159 lb 6.3 oz)  BMI 26.52 kg/m2  SpO2 88%  Physical Exam: Elderly WF in NAD Lungs:  Rales both bases Cardiac:  Regular rhythm, normal S1 and S2, no S3, 3/6 systolic murmur Extremities: No edema present  Intake/Output from previous day: 05/01 0701 - 05/02 0700 In: 960 [P.O.:960] Out: 1875 [Urine:1875] Weight Filed Weights   09/11/12 0559 09/12/12 0606 09/13/12 0509  Weight: 71.94 kg (158 lb 9.6 oz) 72.394 kg (159 lb 9.6 oz) 72.3 kg (159 lb 6.3 oz)    Lab Results: Basic Metabolic Panel:  Recent Labs  16/10/96 0500 09/13/12 0536  NA 139 137  K 4.0 3.8  CL 90* 89*  CO2 42* 44*  GLUCOSE 108* 119*  BUN 36* 34*  CREATININE 1.70* 1.70*    CBC:  Recent Labs  09/11/12 0407 09/12/12 1740  WBC 6.0 6.4  HGB 8.0* 9.4*  HCT 25.3* 29.2*  MCV 91.0 89.0  PLT 333 319    BNP    Component Value Date/Time   PROBNP 530.7* 09/12/2012 0500    PROTIME: Lab Results  Component Value Date   INR 1.70* 09/10/2012   INR 2.15* 09/09/2012   INR 2.28* 09/08/2012    Telemetry: Sinus with first degree block and PVC's  Assessment/Plan:  1. Chronic diastolic CHF due to valvular heart disease and LVH worsened by reduction in diuretic dose and anemia Currently is better and I suspect is euvolemic now. 2. Severe anemia better  3. Stage 3 CKD 4. Mitral and aortic valve disease 5.  Not entirely clear that effusions are all CHF, may have some sort of pulmonary process going on 6. Contraction alkalosis  Rec:  Reduce lasix to daily.  Add diamox for 3 days for contraction alkalosis.   Darden Palmer  MD [facility]  09/13/2012, 1:24 PM

## 2012-09-13 NOTE — Progress Notes (Signed)
CRITICAL VALUE ALERT  Critical value received: Blood Chem CO2 - 44  Date of notification:  09/13/12  Time of notification:  0646  Critical value read back:yes  Nurse who received alert:  Leanor Kail  MD notified (1st page):  L. Harduk  Time of first page:  [phone]  MD notified (2nd page):L. Harduk  Time of second page:  Responding MD:  [id number]  Time MD responded:  Still awaiting

## 2012-09-13 NOTE — Progress Notes (Signed)
Physical Therapy Treatment Patient Details Name: [name] MRN: [id number] DOB: 1924-09-18 Today's Date: 09/13/2012 Time: 4098-1191 PT Time Calculation (min): 28 min  PT Assessment / Plan / Recommendation Comments on Treatment Session  Pt moving well.  Orthostatic BP readings initially with supine>standing EOB, but pt asymptomatic therefore ambulated to sink & performed ADL then rechecked vitals with BP back WNL at this time.  Pt able to ambulate ~150' with RW & performed stair training with no reports of dizziness/feeling "faint".      Follow Up Recommendations  No PT follow up;Supervision/Assistance - 24 hour     Does the patient have the potential to tolerate intense rehabilitation     Barriers to Discharge        Equipment Recommendations       Recommendations for Other Services    Frequency Min 2X/week   Plan Discharge plan remains appropriate    Precautions / Restrictions Precautions Precautions: Fall Precaution Comments: Pt on 3L 02 via [place].  Watch orthostatic BP.   Restrictions Weight Bearing Restrictions: No    Pertinent Vitals/Pain BP:  129/55 supine          122/48 sitting          99/40 standing (asymptomatic)          126/50 Standing at sink x 5 mins    Mobility  Bed Mobility Bed Mobility: Supine to Sit;Sitting - Scoot to Edge of Bed Supine to Sit: 6: Modified independent (Device/Increase time) Sitting - Scoot to Edge of Bed: 6: Modified independent (Device/Increase time) Details for Bed Mobility Assistance: Pt needed no VCs for bed mobility Transfers Transfers: Sit to Stand;Stand to Sit Sit to Stand: 6: Modified independent (Device/Increase time);With upper extremity assist;From bed;With armrests Stand to Sit: 6: Modified independent (Device/Increase time);With upper extremity assist;To chair/3-in-1;With armrests Details for Transfer Assistance: Pt needed no cues for safety with rw and hand placement from sit<>stand Ambulation/Gait Ambulation/Gait  Assistance: 4: Min guard Ambulation Distance (Feet): 150 Feet Assistive device: Rolling walker Ambulation/Gait Assistance Details: Guarding for safety due to BP issues in past sessions.  Orthostatic BP readings initially but leveled out with standing at sink x 5 mins. (pt asymptomatic with orthostatic readings at EOB).   Gait Pattern: Step-through pattern;Decreased stride length Stairs: Yes Stairs Assistance: 4: Min assist Stairs Assistance Details (indicate cue type and reason): (A) for balance.  Pt used 1 rail on Rt with bil UE's.  Increased time & cautious.   Stair Management Technique: One rail Right;Step to pattern;Sideways Number of Stairs: 5 Wheelchair Mobility Wheelchair Mobility: No      PT Goals Acute Rehab PT Goals Time For Goal Achievement: 09/17/12 Potential to Achieve Goals: Good Pt will go Supine/Side to Sit: Independently;with HOB 0 degrees PT Goal: Supine/Side to Sit - Progress: Progressing toward goal Pt will go Sit to Stand: Independently;with upper extremity assist PT Goal: Sit to Stand - Progress: Progressing toward goal Pt will Ambulate: >150 feet;with modified independence;with least restrictive assistive device PT Goal: Ambulate - Progress: Progressing toward goal Pt will Go Up / Down Stairs: 3-5 stairs;with supervision;with rail(s) PT Goal: Up/Down Stairs - Progress: Progressing toward goal  Visit Information  Last PT Received On: 09/13/12 Assistance Needed: +1    Subjective Data  Patient Stated Goal: home   Cognition  Cognition Arousal/Alertness: Awake/alert Behavior During Therapy: WFL for tasks assessed/performed Overall Cognitive Status: Within Functional Limits for tasks assessed    Balance  Balance Balance Assessed: Yes Dynamic Standing Balance Dynamic Standing -  Balance Support: During functional activity;No upper extremity supported Dynamic Standing - Level of Assistance: 6: Modified independent (Device/Increase time) Dynamic Standing -  Comments: 5 mins  End of Session PT - End of Session Equipment Utilized During Treatment: Gait belt;Oxygen Activity Tolerance: Patient tolerated treatment well Patient left: in chair;with call bell/phone within reach Nurse Communication: Mobility status     Verdell Face, [place] [phone] 09/13/2012

## 2012-09-13 NOTE — Progress Notes (Signed)
[facility] PROGRESS NOTE  [name] Sick JWJ:[id number] DOB: 02/03/1925 DOA: 09/08/2012 PCP: [name], MD  Assessment/Plan: 1. CHF with h/o of diastolic CHF, mitral stenosis and mitral regurgitation with last EF measured at 55 percent. - Cardiology on board and managing.  Will defer management to them and follow up with their recommendations. - Currently on oral lasix regimen. Thoracentesis performed. - EF 55-60% with no regional wall motion abnormality. - Lasix to be reduced by cardiology.  2. Recurrent transudative pleural effusions (first mentioned in 2010 on a CT scan of the chest)   transudate per current work up [employer] on board and making further recommendations. - If pleural fluid accumulates rapidly patient may benefit from Pleur-x catheter - f/u with pleural fluid cytology 4/29  3. Anemia with stool occult blood positive - Gi on board and following. - Given recent reports of hemoccult possitive stool and  hgb at 8 with cardiac disease agree with transfusing 1 unit of PRBC which patient is agreeable to at this point. - Recommends continuing PPI.  No plans for EGD at this juncture  4. H/o Afib - Plan on continuing Cardizem. Hold coumadin given # 3  5. Hypothyroidism - TSH recently increased with last check showing TSH level of 11.472.  Synthroid increased from 137 mcg to 150 mcg daily.  Will continue to monitor - Once discharged will need to get TSH levels rechecked in 6 wks  6. CKD - Continue to monitor daily serum creatinines. - creatinin likely increasing due to lasix administration.  Code Status: full Family Communication: No family at bedside. Disposition Plan: With improvement in medical condition.  Lasix changed to po regimen.   Consultants:  Pulm  GI  Cardiology  Procedures:  2 D echocardiogram  Antibiotics:  none  HPI/Subjective: No acute issues reported overnight.  No new complaints at this juncture.  Objective: Filed Vitals:    09/13/12 0910 09/13/12 0915 09/13/12 0928 09/13/12 1439  BP: 99/40 126/50 124/68 130/54  Pulse:    75  Temp:    97.6 F (36.4 C)  TempSrc:    Oral  Resp:    18  Height:      Weight:      SpO2:   88% 98%    Intake/Output Summary (Last 24 hours) at 09/13/12 1652 Last data filed at 09/13/12 1345  Gross per 24 hour  Intake    940 ml  Output   1500 ml  Net   -560 ml   Filed Weights   09/11/12 0559 09/12/12 0606 09/13/12 0509  Weight: 71.94 kg (158 lb 9.6 oz) 72.394 kg (159 lb 9.6 oz) 72.3 kg (159 lb 6.3 oz)    Exam:   General:  Pt in NAD, Alert and Awake  Cardiovascular: S1 and S2 WNL, no rubs  Respiratory: no increased work of breathing, no wheezes, Rhales BL at bases  Abdomen: soft, NT, ND  Musculoskeletal: no cyanosis or clubbing   Data Reviewed: Basic Metabolic Panel:  Recent Labs Lab 09/09/12 0630 09/10/12 0639 09/11/12 0407 09/12/12 0500 09/13/12 0536  NA 142 140 140 139 137  K 3.6 4.0 4.2 4.0 3.8  CL 97 95* 93* 90* 89*  CO2 39* 40* 44* 42* 44*  GLUCOSE 64* 103* 90 108* 119*  BUN 29* 27* 32* 36* 34*  CREATININE 1.33* 1.28* 1.73* 1.70* 1.70*  CALCIUM 9.3 9.4 9.5 9.4 9.3   Liver Function Tests:  Recent Labs Lab 09/09/12 0630  AST 24  ALT 11  ALKPHOS 93  BILITOT 0.2*  PROT 6.9  ALBUMIN 2.9*   No results found for this basename: LIPASE, AMYLASE,  in the last 168 hours No results found for this basename: AMMONIA,  in the last 168 hours CBC:  Recent Labs Lab 09/08/12 1823 09/09/12 0040 09/09/12 0630 09/10/12 0639 09/11/12 0407 09/12/12 1740  WBC 4.3 5.5 4.9 5.3 6.0 6.4  NEUTROABS 2.9  --  3.0  --   --   --   HGB 7.4* 7.5* 7.5* 7.4* 8.0* 9.4*  HCT 23.3* 23.2* 23.7* 23.7* 25.3* 29.2*  MCV 90.0 89.6 90.5 91.2 91.0 89.0  PLT 292 278 306 312 333 319   Cardiac Enzymes:  Recent Labs Lab 09/08/12 1823 09/09/12 0042  TROPONINI <0.30 <0.30   BNP (last 3 results)  Recent Labs  09/02/12 1239 09/08/12 1823 09/12/12 0500  PROBNP  168.0* 707.4* 530.7*   CBG: No results found for this basename: GLUCAP,  in the last 168 hours  Recent Results (from the past 240 hour(s))  BODY FLUID CULTURE     Status: None   Collection Time    09/10/12 10:23 AM      Result Value Range Status   Specimen Description PLEURAL FLUID RIGHT   Final   Special Requests 7.0ML FLUID   Final   Gram Stain     Final   Value: RARE WBC PRESENT, PREDOMINANTLY MONONUCLEAR     NO ORGANISMS SEEN   Culture NO GROWTH 3 DAYS   Final   Report Status 09/13/2012 FINAL   Final     Studies: No results found.  Scheduled Meds: . acetaZOLAMIDE  250 mg Oral BID  . beta carotene w/minerals  1 tablet Oral BID  . cholecalciferol  1,000 Units Oral q morning - 10a  . diltiazem  180 mg Oral q morning - 10a  . ferrous sulfate  325 mg Oral QHS  . folic acid  1 mg Oral QHS  . [START ON 09/14/2012] furosemide  40 mg Oral Daily  . levothyroxine  150 mcg Oral QAC breakfast  . multivitamin with minerals  1 tablet Oral q morning - 10a  . pantoprazole  40 mg Oral Q1200  . simvastatin  10 mg Oral QHS  . sodium chloride  3 mL Intravenous Q12H  . sodium chloride  3 mL Intravenous Q12H  . traMADol  50 mg Oral BID   Continuous Infusions:   Active Problems:   Dyspnea   Hypothyroidism   Pleural effusion   Chronic respiratory failure with hypoxia   Pulmonary hypertension   Acute on chronic diastolic congestive heart failure   CKD (chronic kidney disease) stage 3, GFR 30-59 ml/min   Atrial fibrillation   Anemia    Time spent: > 35 minutes    [name]  [facility] Pager [phone] If 7PM-7AM, please contact night-coverage at [email or web address], password [facility] 09/13/2012, 4:52 PM  LOS: 5 days

## 2012-09-13 NOTE — Progress Notes (Signed)
Utilization Review Completed.   Cayleen Benjamin, RN, BSN Nurse Case Manager  [phone]  

## 2012-09-14 DIAGNOSIS — I951 Orthostatic hypotension: Secondary | ICD-10-CM

## 2012-09-14 LAB — BASIC METABOLIC PANEL
BUN: 40 mg/dL — ABNORMAL HIGH (ref 6–23)
Chloride: 90 mEq/L — ABNORMAL LOW (ref 96–112)
GFR calc Af Amer: 32 mL/min — ABNORMAL LOW (ref >90)
Potassium: 3.5 mEq/L (ref 3.5–5.1)

## 2012-09-14 MED ORDER — DILTIAZEM HCL ER COATED BEADS 120 MG PO CP24
120.0000 mg | ORAL_CAPSULE | Freq: Every morning | ORAL | Status: DC
  Administered 2012-09-14: 120 mg via ORAL
  Filled 2012-09-14: qty 1

## 2012-09-14 NOTE — Progress Notes (Signed)
[name] 11:38 AM  Subjective: Patient without any GI complaint and no signs of active bleeding and asked by Dr. Randa Evens my partner to look in on today  and her family history is negative and her heart and lungs are her biggest problem and she was on Coumadin as an outpatient Objective: Vital signs stable afebrile no acute distress abdomen is soft nontender labs reviewed not clearly iron deficient with normal range ferritin Assessment: Multiple medical problem  Plan: Let me know when okay from a heart and lungs standpoint if you would truly like a GI work up or she can be seen by her gastroenterologist Dr. Leonard Schwartz. as an outpt and consider repeat guaiac again [facility]

## 2012-09-14 NOTE — Progress Notes (Signed)
[facility] PROGRESS NOTE  [name] WUJ:[id number] DOB: 01/03/1925 DOA: 09/08/2012 PCP: [name], MD  Assessment/Plan: 1. CHF with h/o of diastolic CHF, mitral stenosis and mitral regurgitation with last EF measured at 55 percent. - Cardiology on board and managing.  Will defer management to them and follow up with their recommendations. - Currently on oral lasix regimen. Thoracentesis performed. - EF 55-60% with no regional wall motion abnormality. - Lasix to be reduced by cardiology.  2. Recurrent transudative pleural effusions (first mentioned in 2010 on a CT scan of the chest)   transudate per current work up [employer] on board and making further recommendations. - If pleural fluid accumulates rapidly patient may benefit from Pleur-x catheter - f/u with pleural fluid cytology 4/29 - Most likely repeat chest x ray next am.  3. Anemia with stool occult blood positive - Gi on board and following. - Given recent reports of hemoccult possitive stool and  hgb at 8 with cardiac disease agree with transfusing 1 unit of PRBC which patient is agreeable to at this point. - Recommends continuing PPI.  No plans for EGD at this juncture  4. H/o Afib - Plan on continuing Cardizem. Hold coumadin given # 3  5. Hypothyroidism - TSH recently increased with last check showing TSH level of 11.472.  Synthroid increased from 137 mcg to 150 mcg daily.  Will continue to monitor - Once discharged will need to get TSH levels rechecked in 6 wks  6. CKD - Continue to monitor daily serum creatinines. - creatinin likely increasing due to lasix administration.  7. Orthostatic Hypotension - New problem which has been affecting disposition.   - No red flags reported on telemetry - Last blood pressure on standing 76/39 - Will plan on decreasing cardizem cd from 180 to 120 mg po daily - I would appreciate cardiology input in this matter as I suspect that lasix and cardizem are the most  likely cause.  Code Status: full Family Communication: No family at bedside. Disposition Plan: With improvement in medical condition.  Lasix changed to po regimen.   Consultants:  Pulm  GI  Cardiology  Procedures:  2 D echocardiogram  Antibiotics:  none  HPI/Subjective: Patient mentions that when she stands up she feels weak.    Objective: Filed Vitals:   09/14/12 9562 09/14/12 0643 09/14/12 0644 09/14/12 0655  BP: 129/43 122/45 76/39   Pulse: 83 85 90   Temp: 97.2 F (36.2 C)     TempSrc: Oral     Resp: 18  20   Height:      Weight:    72.167 kg (159 lb 1.6 oz)  SpO2: 97% 95% 96%     Intake/Output Summary (Last 24 hours) at 09/14/12 1014 Last data filed at 09/14/12 0839  Gross per 24 hour  Intake    803 ml  Output    750 ml  Net     53 ml   Filed Weights   09/13/12 0509 09/14/12 0500 09/14/12 0655  Weight: 72.3 kg (159 lb 6.3 oz) 72.167 kg (159 lb 1.6 oz) 72.167 kg (159 lb 1.6 oz)    Exam:   General:  Pt in NAD, Alert and Awake  Cardiovascular: S1 and S2 WNL, no rubs  Respiratory: no increased work of breathing, no wheezes, Rhales BL at bases mild  Abdomen: soft, NT, ND  Musculoskeletal: no cyanosis or clubbing   Data Reviewed: Basic Metabolic Panel:  Recent Labs Lab 09/10/12 0639 09/11/12 0407 09/12/12  0500 09/13/12 0536 09/14/12 0510  NA 140 140 139 137 136  K 4.0 4.2 4.0 3.8 3.5  CL 95* 93* 90* 89* 90*  CO2 40* 44* 42* 44* 38*  GLUCOSE 103* 90 108* 119* 119*  BUN 27* 32* 36* 34* 40*  CREATININE 1.28* 1.73* 1.70* 1.70* 1.64*  CALCIUM 9.4 9.5 9.4 9.3 9.1   Liver Function Tests:  Recent Labs Lab 09/09/12 0630  AST 24  ALT 11  ALKPHOS 93  BILITOT 0.2*  PROT 6.9  ALBUMIN 2.9*   No results found for this basename: LIPASE, AMYLASE,  in the last 168 hours No results found for this basename: AMMONIA,  in the last 168 hours CBC:  Recent Labs Lab 09/08/12 1823 09/09/12 0040 09/09/12 0630 09/10/12 0639 09/11/12 0407  09/12/12 1740  WBC 4.3 5.5 4.9 5.3 6.0 6.4  NEUTROABS 2.9  --  3.0  --   --   --   HGB 7.4* 7.5* 7.5* 7.4* 8.0* 9.4*  HCT 23.3* 23.2* 23.7* 23.7* 25.3* 29.2*  MCV 90.0 89.6 90.5 91.2 91.0 89.0  PLT 292 278 306 312 333 319   Cardiac Enzymes:  Recent Labs Lab 09/08/12 1823 09/09/12 0042  TROPONINI <0.30 <0.30   BNP (last 3 results)  Recent Labs  09/02/12 1239 09/08/12 1823 09/12/12 0500  PROBNP 168.0* 707.4* 530.7*   CBG: No results found for this basename: GLUCAP,  in the last 168 hours  Recent Results (from the past 240 hour(s))  BODY FLUID CULTURE     Status: None   Collection Time    09/10/12 10:23 AM      Result Value Range Status   Specimen Description PLEURAL FLUID RIGHT   Final   Special Requests 7.0ML FLUID   Final   Gram Stain     Final   Value: RARE WBC PRESENT, PREDOMINANTLY MONONUCLEAR     NO ORGANISMS SEEN   Culture NO GROWTH 3 DAYS   Final   Report Status 09/13/2012 FINAL   Final     Studies: No results found.  Scheduled Meds: . acetaZOLAMIDE  250 mg Oral BID  . beta carotene w/minerals  1 tablet Oral BID  . cholecalciferol  1,000 Units Oral q morning - 10a  . diltiazem  120 mg Oral q morning - 10a  . ferrous sulfate  325 mg Oral QHS  . folic acid  1 mg Oral QHS  . furosemide  40 mg Oral Daily  . levothyroxine  150 mcg Oral QAC breakfast  . multivitamin with minerals  1 tablet Oral q morning - 10a  . pantoprazole  40 mg Oral Q1200  . simvastatin  10 mg Oral QHS  . sodium chloride  3 mL Intravenous Q12H  . sodium chloride  3 mL Intravenous Q12H  . traMADol  50 mg Oral BID   Continuous Infusions:   Active Problems:   Dyspnea   Hypothyroidism   Pleural effusion   Chronic respiratory failure with hypoxia   Pulmonary hypertension   Acute on chronic diastolic congestive heart failure   CKD (chronic kidney disease) stage 3, GFR 30-59 ml/min   Atrial fibrillation   Anemia    Time spent: > 35 minutes    Penny Pia  [facility] Pager [phone] If 7PM-7AM, please contact night-coverage at [email or web address], password [facility] 09/14/2012, 10:14 AM  LOS: 6 days

## 2012-09-14 NOTE — Progress Notes (Signed)
SUBJECTIVE:  No complaints of SOB but still having problems with orthostasis - Hospitalists decreased Cardizem dose this am  OBJECTIVE:   Vitals:   Filed Vitals:   09/14/12 0643 09/14/12 0644 09/14/12 0655 09/14/12 1031  BP: 122/45 76/39  122/43  Pulse: 85 90    Temp:      TempSrc:      Resp:  20    Height:      Weight:   72.167 kg (159 lb 1.6 oz)   SpO2: 95% 96%     I&O's:   Intake/Output Summary (Last 24 hours) at 09/14/12 1114 Last data filed at 09/14/12 0839  Gross per 24 hour  Intake    803 ml  Output    750 ml  Net     53 ml   TELEMETRY: Reviewed telemetry pt in NSR:     PHYSICAL EXAM General: Well developed, well nourished, in no acute distress Head: Eyes PERRLA, No xanthomas.   Normal cephalic and atramatic  Lungs:   Clear bilaterally to auscultation and percussion. Heart:   HRRR S1 S2 Pulses are 2+ & equal. Abdomen: Bowel sounds are positive, abdomen soft and non-tender without masses Extremities:   No clubbing, cyanosis or edema.  DP +1 Neuro: Alert and oriented X 3. Psych:  Good affect, responds appropriately   LABS: Basic Metabolic Panel:  Recent Labs  45/40/98 0536 09/14/12 0510  NA 137 136  K 3.8 3.5  CL 89* 90*  CO2 44* 38*  GLUCOSE 119* 119*  BUN 34* 40*  CREATININE 1.70* 1.64*  CALCIUM 9.3 9.1   Liver Function Tests: No results found for this basename: AST, ALT, ALKPHOS, BILITOT, PROT, ALBUMIN,  in the last 72 hours No results found for this basename: LIPASE, AMYLASE,  in the last 72 hours CBC:  Recent Labs  09/12/12 1740  WBC 6.4  HGB 9.4*  HCT 29.2*  MCV 89.0  PLT 319   Cardiac Enzymes: No results found for this basename: CKTOTAL, CKMB, CKMBINDEX, TROPONINI,  in the last 72 hours BNP: No components found with this basename: POCBNP,  D-Dimer: No results found for this basename: DDIMER,  in the last 72 hours Hemoglobin A1C: No results found for this basename: HGBA1C,  in the last 72 hours Fasting Lipid Panel: No results  found for this basename: CHOL, HDL, LDLCALC, TRIG, CHOLHDL, LDLDIRECT,  in the last 72 hours Thyroid Function Tests: No results found for this basename: TSH, T4TOTAL, FREET3, T3FREE, THYROIDAB,  in the last 72 hours Anemia Panel: No results found for this basename: VITAMINB12, FOLATE, FERRITIN, TIBC, IRON, RETICCTPCT,  in the last 72 hours Coag Panel:   Lab Results  Component Value Date   INR 1.70* 09/10/2012   INR 2.15* 09/09/2012   INR 2.28* 09/08/2012    RADIOLOGY: Dg Chest 2 View  09/11/2012  *RADIOLOGY REPORT*  Clinical Data: Right pleural effusion  CHEST - 2 VIEW  Comparison: September 10, 2012.  Findings: Bilateral pleural effusions are again noted with right greater than left.  These are not significantly changed compared to prior exam.  Slightly increased right basilar opacity is noted concerning for edema or pneumonia.  No pneumothorax is noted.  Bony thorax is intact.  IMPRESSION: Stable bilateral pleural effusions with right greater than left. Slightly increased right lower lobe opacity is noted concerning for edema or possibly pneumonia.   Original Report Authenticated By: Lupita Raider.,  M.D.    Dg Chest 2 View  09/08/2012  *RADIOLOGY REPORT*  Clinical Data: Short of breath.  Right-sided back pain.  Irregular heart beat.  CHEST - 2 VIEW  Comparison: 09/02/2012.  Findings: Slight increase in the right pleural effusion which now layers more dependently.  Increasing left basilar subsegmental atelectasis.  Pulmonary vascular congestion.  The findings suggest volume overload/CHF.  Cardiopericardial silhouette appears similar.  IMPRESSION: Increasing right pleural effusion, now moderate.  Associated atelectasis and increasing atelectasis at the left lung base. Pulmonary vascular congestion with cephalization of blood flow suggest volume overload.   Original Report Authenticated By: Andreas Newport, M.D.    Dg Chest 2 View  09/02/2012  *RADIOLOGY REPORT*  Clinical Data: Pleural effusion.   Shortness of breath.  CHEST - 2 VIEW  Comparison: PA and lateral chest 07/17/2012.  Findings: Right pleural effusion has increased in size since the prior study and now fills approximately one half of the right chest.  No left effusion is identified.  There is no pneumothorax. Basilar airspace disease on the right is likely due to compressive atelectasis.  Heart size is normal.  IMPRESSION: Increased right pleural effusion.   Original Report Authenticated By: Holley Dexter, M.D.    Dg Chest Port 1 View  09/10/2012  *RADIOLOGY REPORT*  Clinical Data: Follow-up right thoracentesis.  PORTABLE CHEST - 1 VIEW  Comparison: 09/08/2012  Findings: Decreasing right pleural effusion following thoracentesis.  No pneumothorax.  Small right pleural effusion with right lower lobe airspace disease persists.  Stable left basilar atelectasis or scarring.  Underlying COPD.  IMPRESSION: Decreasing right pleural effusion and right lower lobe airspace disease following thoracentesis.  No pneumothorax.   Original Report Authenticated By: Charlett Nose, M.D.     Assessment/Plan:  1. Chronic diastolic CHF due to valvular heart disease and LVH worsened by reduction in diuretic dose and anemia Currently is better and I suspect is euvolemic now.  2. Severe anemia better  3. Stage 3 CKD  4. Mitral and aortic valve disease  5. Not entirely clear that effusions are all CHF, may have some sort of pulmonary process going on - s/p thoracentesis 6. Contraction alkalosis - CO2 improved on diamox 7.  Orthostatic hypotension - Cardizem dose decreased today by hospitalist - woul dgo ahead and stop altogether for now.  Compression hose for orthostasis      Quintella Reichert, MD  09/14/2012  11:14 AM

## 2012-09-15 ENCOUNTER — Inpatient Hospital Stay (HOSPITAL_COMMUNITY): Payer: Medicare Other

## 2012-09-15 DIAGNOSIS — I951 Orthostatic hypotension: Secondary | ICD-10-CM

## 2012-09-15 DIAGNOSIS — R0602 Shortness of breath: Secondary | ICD-10-CM

## 2012-09-15 LAB — PRO B NATRIURETIC PEPTIDE: Pro B Natriuretic peptide (BNP): 514.5 pg/mL — ABNORMAL HIGH (ref 0–450)

## 2012-09-15 LAB — BASIC METABOLIC PANEL
CO2: 38 mEq/L — ABNORMAL HIGH (ref 19–32)
Chloride: 92 mEq/L — ABNORMAL LOW (ref 96–112)
Sodium: 138 mEq/L (ref 135–145)

## 2012-09-15 NOTE — Progress Notes (Signed)
[facility] PROGRESS NOTE  [name] ZOX:[id number] DOB: 29-Jun-1924 DOA: 09/08/2012 PCP: [name], MD  Assessment/Plan: 1. CHF with h/o of diastolic CHF, mitral stenosis and mitral regurgitation with last EF measured at 55 percent. - Cardiology on board and managing.  Will defer management to them and follow up with their recommendations. - Currently on oral lasix regimen. Thoracentesis performed. - EF 55-60% with no regional wall motion abnormality. - Lasix to be reduced by cardiology.  2. Recurrent transudative pleural effusions (first mentioned in 2010 on a CT scan of the chest)   transudate per current work up [employer] on board and making further recommendations. - If pleural fluid accumulates rapidly patient may benefit from Pleur-x catheter - Pleural fluid cytology 4/29 reported as no malignant cells identified. - Most likely repeat chest x ray next am.  3. Anemia with stool occult blood positive - Gi on board and following. - Given recent reports of hemoccult possitive stool and  hgb at 8 with cardiac disease agree with transfusing 1 unit of PRBC which patient is agreeable to at this point. - Recommends continuing PPI.  No plans for EGD at this juncture - Most likely I would favor having patient f/u with her GI doctor as outpatient, unless otherwise recommended by other physicians involved.  4. H/o Afib - Plan on continuing Cardizem. Hold coumadin given # 3  5. Hypothyroidism - TSH recently increased with last check showing TSH level of 11.472.  Synthroid increased from 137 mcg to 150 mcg daily.  Will continue to monitor - Once discharged will need to get TSH levels rechecked in 6 wks  6. CKD - Continue to monitor daily serum creatinines. - creatinin likely increasing due to lasix administration.  7. Orthostatic Hypotension - New problem which has been affecting disposition.   - No red flags reported on telemetry - Still having orthostatic hypotension  currently - Cardiology recommending holding cardizem.   - I would appreciate cardiology input in this matter.  Code Status: full Family Communication: No family at bedside. Disposition Plan: With improvement in medical condition.  Lasix changed to po regimen.   Consultants:  Pulm  GI  Cardiology  Procedures:  2 D echocardiogram  Antibiotics:  none  HPI/Subjective: Pt is currently feeling better and denies weakness when standing.    Objective: Filed Vitals:   09/15/12 0441 09/15/12 0442 09/15/12 0444 09/15/12 0447  BP: 123/65 123/70 91/55 83/48   Pulse: 96 91 89 98  Temp: 98.3 F (36.8 C)     TempSrc: Oral     Resp: 18 18 18 18   Height:      Weight:   72.5 kg (159 lb 13.3 oz)   SpO2: 96% 96% 92% 92%    Intake/Output Summary (Last 24 hours) at 09/15/12 0916 Last data filed at 09/15/12 0815  Gross per 24 hour  Intake    560 ml  Output   1450 ml  Net   -890 ml   Filed Weights   09/14/12 0500 09/14/12 0655 09/15/12 0444  Weight: 72.167 kg (159 lb 1.6 oz) 72.167 kg (159 lb 1.6 oz) 72.5 kg (159 lb 13.3 oz)    Exam:   General:  Pt in NAD, Alert and Awake  Cardiovascular: S1 and S2 WNL, no rubs  Respiratory: no increased work of breathing, no wheezes, Rhales BL at bases mild  Abdomen: soft, NT, ND  Musculoskeletal: no cyanosis or clubbing   Data Reviewed: Basic Metabolic Panel:  Recent Labs Lab 09/11/12 0407  09/12/12 0500 09/13/12 0536 09/14/12 0510 09/15/12 0530  NA 140 139 137 136 138  K 4.2 4.0 3.8 3.5 3.5  CL 93* 90* 89* 90* 92*  CO2 44* 42* 44* 38* 38*  GLUCOSE 90 108* 119* 119* 104*  BUN 32* 36* 34* 40* 47*  CREATININE 1.73* 1.70* 1.70* 1.64* 1.77*  CALCIUM 9.5 9.4 9.3 9.1 9.2   Liver Function Tests:  Recent Labs Lab 09/09/12 0630  AST 24  ALT 11  ALKPHOS 93  BILITOT 0.2*  PROT 6.9  ALBUMIN 2.9*   No results found for this basename: LIPASE, AMYLASE,  in the last 168 hours No results found for this basename: AMMONIA,  in  the last 168 hours CBC:  Recent Labs Lab 09/08/12 1823 09/09/12 0040 09/09/12 0630 09/10/12 0639 09/11/12 0407 09/12/12 1740  WBC 4.3 5.5 4.9 5.3 6.0 6.4  NEUTROABS 2.9  --  3.0  --   --   --   HGB 7.4* 7.5* 7.5* 7.4* 8.0* 9.4*  HCT 23.3* 23.2* 23.7* 23.7* 25.3* 29.2*  MCV 90.0 89.6 90.5 91.2 91.0 89.0  PLT 292 278 306 312 333 319   Cardiac Enzymes:  Recent Labs Lab 09/08/12 1823 09/09/12 0042  TROPONINI <0.30 <0.30   BNP (last 3 results)  Recent Labs  09/02/12 1239 09/08/12 1823 09/12/12 0500  PROBNP 168.0* 707.4* 530.7*   CBG: No results found for this basename: GLUCAP,  in the last 168 hours  Recent Results (from the past 240 hour(s))  BODY FLUID CULTURE     Status: None   Collection Time    09/10/12 10:23 AM      Result Value Range Status   Specimen Description PLEURAL FLUID RIGHT   Final   Special Requests 7.0ML FLUID   Final   Gram Stain     Final   Value: RARE WBC PRESENT, PREDOMINANTLY MONONUCLEAR     NO ORGANISMS SEEN   Culture NO GROWTH 3 DAYS   Final   Report Status 09/13/2012 FINAL   Final     Studies: No results found.  Scheduled Meds: . acetaZOLAMIDE  250 mg Oral BID  . beta carotene w/minerals  1 tablet Oral BID  . cholecalciferol  1,000 Units Oral q morning - 10a  . ferrous sulfate  325 mg Oral QHS  . folic acid  1 mg Oral QHS  . furosemide  40 mg Oral Daily  . levothyroxine  150 mcg Oral QAC breakfast  . multivitamin with minerals  1 tablet Oral q morning - 10a  . pantoprazole  40 mg Oral Q1200  . simvastatin  10 mg Oral QHS  . sodium chloride  3 mL Intravenous Q12H  . sodium chloride  3 mL Intravenous Q12H  . traMADol  50 mg Oral BID   Continuous Infusions:   Active Problems:   Dyspnea   Hypothyroidism   Pleural effusion   Chronic respiratory failure with hypoxia   Pulmonary hypertension   Acute on chronic diastolic congestive heart failure   CKD (chronic kidney disease) stage 3, GFR 30-59 ml/min   Atrial fibrillation    Anemia   Orthostatic hypotension    Time spent: > 35 minutes    Penny Pia  [facility] Pager [phone] If 7PM-7AM, please contact night-coverage at [email or web address], password [facility] 09/15/2012, 9:16 AM  LOS: 7 days

## 2012-09-15 NOTE — Progress Notes (Signed)
SUBJECTIVE:  No complaints  OBJECTIVE:   Vitals:   Filed Vitals:   09/15/12 0441 09/15/12 0442 09/15/12 0444 09/15/12 0447  BP: 123/65 123/70 91/55 83/48   Pulse: 96 91 89 98  Temp: 98.3 F (36.8 C)     TempSrc: Oral     Resp: 18 18 18 18   Height:      Weight:   72.5 kg (159 lb 13.3 oz)   SpO2: 96% 96% 92% 92%   I&O's:   Intake/Output Summary (Last 24 hours) at 09/15/12 0955 Last data filed at 09/15/12 0815  Gross per 24 hour  Intake    560 ml  Output   1450 ml  Net   -890 ml   TELEMETRY: Reviewed telemetry pt in NSR:     PHYSICAL EXAM General: Well developed, well nourished, in no acute distress Head: Eyes PERRLA, No xanthomas.   Normal cephalic and atramatic  Lungs:   Crackles at bases bilaterally Heart:   HRRR S1 S2 Pulses are 2+ & equal. Abdomen: Bowel sounds are positive, abdomen soft and non-tender without masses Extremities:   No clubbing, cyanosis or edema.  DP +1 Neuro: Alert and oriented X 3. Psych:  Good affect, responds appropriately   LABS: Basic Metabolic Panel:  Recent Labs  16/10/96 0510 09/15/12 0530  NA 136 138  K 3.5 3.5  CL 90* 92*  CO2 38* 38*  GLUCOSE 119* 104*  BUN 40* 47*  CREATININE 1.64* 1.77*  CALCIUM 9.1 9.2   Liver Function Tests: No results found for this basename: AST, ALT, ALKPHOS, BILITOT, PROT, ALBUMIN,  in the last 72 hours No results found for this basename: LIPASE, AMYLASE,  in the last 72 hours CBC:  Recent Labs  09/12/12 1740  WBC 6.4  HGB 9.4*  HCT 29.2*  MCV 89.0  PLT 319   Cardiac Enzymes: No results found for this basename: CKTOTAL, CKMB, CKMBINDEX, TROPONINI,  in the last 72 hours BNP: No components found with this basename: POCBNP,  D-Dimer: No results found for this basename: DDIMER,  in the last 72 hours Hemoglobin A1C: No results found for this basename: HGBA1C,  in the last 72 hours Fasting Lipid Panel: No results found for this basename: CHOL, HDL, LDLCALC, TRIG, CHOLHDL, LDLDIRECT,  in the  last 72 hours Thyroid Function Tests: No results found for this basename: TSH, T4TOTAL, FREET3, T3FREE, THYROIDAB,  in the last 72 hours Anemia Panel: No results found for this basename: VITAMINB12, FOLATE, FERRITIN, TIBC, IRON, RETICCTPCT,  in the last 72 hours Coag Panel:   Lab Results  Component Value Date   INR 1.70* 09/10/2012   INR 2.15* 09/09/2012   INR 2.28* 09/08/2012    RADIOLOGY: Dg Chest 2 View  09/11/2012  *RADIOLOGY REPORT*  Clinical Data: Right pleural effusion  CHEST - 2 VIEW  Comparison: September 10, 2012.  Findings: Bilateral pleural effusions are again noted with right greater than left.  These are not significantly changed compared to prior exam.  Slightly increased right basilar opacity is noted concerning for edema or pneumonia.  No pneumothorax is noted.  Bony thorax is intact.  IMPRESSION: Stable bilateral pleural effusions with right greater than left. Slightly increased right lower lobe opacity is noted concerning for edema or possibly pneumonia.   Original Report Authenticated By: Lupita Raider.,  M.D.    Dg Chest 2 View  09/08/2012  *RADIOLOGY REPORT*  Clinical Data: Short of breath.  Right-sided back pain.  Irregular heart beat.  CHEST - 2  VIEW  Comparison: 09/02/2012.  Findings: Slight increase in the right pleural effusion which now layers more dependently.  Increasing left basilar subsegmental atelectasis.  Pulmonary vascular congestion.  The findings suggest volume overload/CHF.  Cardiopericardial silhouette appears similar.  IMPRESSION: Increasing right pleural effusion, now moderate.  Associated atelectasis and increasing atelectasis at the left lung base. Pulmonary vascular congestion with cephalization of blood flow suggest volume overload.   Original Report Authenticated By: Andreas Newport, M.D.    Dg Chest 2 View  09/02/2012  *RADIOLOGY REPORT*  Clinical Data: Pleural effusion.  Shortness of breath.  CHEST - 2 VIEW  Comparison: PA and lateral chest 07/17/2012.   Findings: Right pleural effusion has increased in size since the prior study and now fills approximately one half of the right chest.  No left effusion is identified.  There is no pneumothorax. Basilar airspace disease on the right is likely due to compressive atelectasis.  Heart size is normal.  IMPRESSION: Increased right pleural effusion.   Original Report Authenticated By: Holley Dexter, M.D.    Dg Chest Port 1 View  09/10/2012  *RADIOLOGY REPORT*  Clinical Data: Follow-up right thoracentesis.  PORTABLE CHEST - 1 VIEW  Comparison: 09/08/2012  Findings: Decreasing right pleural effusion following thoracentesis.  No pneumothorax.  Small right pleural effusion with right lower lobe airspace disease persists.  Stable left basilar atelectasis or scarring.  Underlying COPD.  IMPRESSION: Decreasing right pleural effusion and right lower lobe airspace disease following thoracentesis.  No pneumothorax.   Original Report Authenticated By: Charlett Nose, M.D.     Assessment/Plan:  1. Chronic diastolic CHF due to valvular heart disease and LVH worsened by reduction in diuretic dose and anemia She has some crackles at bases today but cannot determine whether this is due to CHF or from atelectasis from lying in bed or compressive atelectasis from effusions.  Will check a BNP and repeat chest xray 2. Severe anemia better  3. Stage 3 CKD- creatinine trending upward - hold lasix for now 4. Mitral and aortic valve disease  5. Not entirely clear that effusions are all CHF, may have some sort of pulmonary process going on - s/p thoracentesis  6. Contraction alkalosis - CO2 improved on diamox - will d/c diamox today 7. Orthostatic hypotension - BP improved yesterday afternoon but now orthostatic again this am.  Cardizem d/c'd. Compression hose for orthostasis- will hold Lasix for now  8.  Atrial fibrillation with coumadin on hold secondary to anemia       Quintella Reichert, MD  09/15/2012  9:55 AM

## 2012-09-16 LAB — BASIC METABOLIC PANEL
CO2: 40 mEq/L (ref 19–32)
Chloride: 93 mEq/L — ABNORMAL LOW (ref 96–112)
Sodium: 138 mEq/L (ref 135–145)

## 2012-09-16 MED ORDER — FUROSEMIDE 40 MG PO TABS
40.0000 mg | ORAL_TABLET | Freq: Every day | ORAL | Status: DC
  Administered 2012-09-16 – 2012-09-17 (×2): 40 mg via ORAL
  Filled 2012-09-16 (×2): qty 1

## 2012-09-16 NOTE — Progress Notes (Signed)
Physical Therapy Treatment Patient Details Name: [name] MRN: [id number] DOB: February 07, 1925 Today's Date: 09/16/2012 Time: 0801-0830 PT Time Calculation (min): 29 min  PT Assessment / Plan / Recommendation Comments on Treatment Session   Pt able to increase ambulation distance today but sit<>stand transfers appeared more effortful today compared to last PT session (? 2/2 knee pain).   Educated pt on HEP.      Follow Up Recommendations  No PT follow up;Supervision/Assistance - 24 hour     Does the patient have the potential to tolerate intense rehabilitation     Barriers to Discharge        Equipment Recommendations       Recommendations for Other Services    Frequency Min 2X/week   Plan Discharge plan remains appropriate    Precautions / Restrictions Precautions Precautions: Fall Precaution Comments: Pt on 3L 02 via [place].  Watch orthostatic BP.   Restrictions Weight Bearing Restrictions: No    Pertinent Vitals/Pain BP:  111/43 supine         112/42 sitting EOB         101/38  Standing         121/43  Sitting after ambulation  Pt c/o bil knee pain with activity 2/2 arthritis    Mobility  Bed Mobility Bed Mobility: Supine to Sit;Sitting - Scoot to Edge of Bed Supine to Sit: 6: Modified independent (Device/Increase time) Sitting - Scoot to Edge of Bed: 6: Modified independent (Device/Increase time) Transfers Transfers: Sit to Stand;Stand to Sit Sit to Stand: 4: Min guard;With upper extremity assist;From bed Stand to Sit: 5: Supervision;With armrests;To chair/3-in-1;To bed Details for Transfer Assistance: Transfer appeared more effortful today compared to last PT session.  Cues for hand placement & to not push RW to side before turning around to position herself with stand>sit.  Ambulation/Gait Ambulation/Gait Assistance: 5: Supervision Ambulation Distance (Feet): 180 Feet Assistive device: Rolling walker Ambulation/Gait Assistance Details: Cues for proper breathing  technique.  Pt denies dizziness.  C/o bil knee pain but reports this is chronic due to arthritis.  Cues for pt to self monitor activity tolerance & need for rest if needed.   Gait Pattern: Decreased stride length (decreased step height) Stairs: No    Exercises General Exercises - Lower Extremity Ankle Circles/Pumps: AROM;Both;15 reps Long Arc Quad: AROM;Strengthening;Both;10 reps Heel Slides: AROM;Both;10 reps Straight Leg Raises: AROM;Strengthening;Both;10 reps Hip Flexion/Marching: AROM;Both;10 reps     PT Goals Acute Rehab PT Goals Time For Goal Achievement: 09/17/12 Potential to Achieve Goals: Good Pt will go Supine/Side to Sit: Independently;with HOB 0 degrees PT Goal: Supine/Side to Sit - Progress: Progressing toward goal Pt will go Sit to Stand: Independently;with upper extremity assist PT Goal: Sit to Stand - Progress: Progressing toward goal Pt will Ambulate: >150 feet;with modified independence;with least restrictive assistive device PT Goal: Ambulate - Progress: Progressing toward goal Pt will Go Up / Down Stairs: 3-5 stairs;with supervision;with rail(s)  Visit Information  Last PT Received On: 09/16/12 Assistance Needed: +1    Subjective Data      Cognition  Cognition Arousal/Alertness: Awake/alert Behavior During Therapy: WFL for tasks assessed/performed Overall Cognitive Status: Within Functional Limits for tasks assessed    Balance  Static Standing Balance Static Standing - Balance Support: No upper extremity supported Static Standing - Level of Assistance: 5: Stand by assistance Static Standing - Comment/# of Minutes: 2 mins  End of Session PT - End of Session Equipment Utilized During Treatment: Gait belt;Oxygen Activity Tolerance: Patient tolerated  treatment well Patient left: in chair;with call bell/phone within reach Nurse Communication: Mobility status     Verdell Face, [place] [phone] 09/16/2012

## 2012-09-16 NOTE — Progress Notes (Signed)
Utilization Review Completed.   Brenner Visconti, RN, BSN Nurse Case Manager  [phone]  

## 2012-09-16 NOTE — Progress Notes (Addendum)
[facility] PROGRESS NOTE  [name] ZOX:[id number] DOB: [birth year] DOA: 09/08/2012 PCP: [name], MD  Assessment/Plan: 1. CHF with h/o of diastolic CHF, mitral stenosis and mitral regurgitation with last EF measured at 55 percent. - Cardiology on board and managing.  Will defer management to them and follow up with their recommendations. - Currently on oral lasix regimen. Thoracentesis performed. - EF 55-60% with no regional wall motion abnormality. - Lasix managed by cards currently complicated by orthostasis.  2. Recurrent transudative pleural effusions (first mentioned in 2010 on a CT scan of the chest)   transudate per current work up [employer] on board and making further recommendations. - If pleural fluid accumulates rapidly patient may benefit from Pleur-x catheter - Pleural fluid cytology 4/29 reported as no malignant cells identified. - Plan on repeating chest x ray if worsening SOB.  3. Anemia with stool occult blood positive - Gi on board and following. - Given recent reports of hemoccult possitive stool and  hgb at 8 with cardiac disease agree with transfusing 1 unit of PRBC which patient is agreeable to at this point. Addendum: Patient was transfused 1 units of PRBC on 4/30 and did not require any more transfusion - Recommends continuing PPI.  No plans for EGD at this juncture - Most likely I would favor having patient f/u with her GI doctor as outpatient, unless otherwise recommended by other physicians involved.  4. H/o Afib - Plan on continuing Cardizem. Hold coumadin given # 3 Addendum: Cardizem not continued and was being held by cardiology.  5. Hypothyroidism - TSH recently increased with last check showing TSH level of 11.472.  Synthroid increased from 137 mcg to 150 mcg daily.  Will continue to monitor - Once discharged will need to get TSH levels rechecked in 6 wks  6. CKD - Continue to monitor daily serum creatinines. - creatinin likely  increasing due to lasix administration.   7. Orthostatic Hypotension - New problem which has been affecting disposition.   - No red flags reported on telemetry - Still having orthostatic hypotension currently but improved off of cardizem and lasix. - Cardiology recommending holding cardizem.   - I would appreciate cardiology continued input in this matter. - Patient feels improved when standing  Code Status: full Family Communication: No family at bedside. Disposition Plan: With improvement in medical condition.  Lasix changed to po regimen.   Consultants:  Pulm  GI  Cardiology  Procedures:  2 D echocardiogram  Antibiotics:  none  HPI/Subjective: Pt is currently feeling better. No new complaints  Objective: Filed Vitals:   09/16/12 0645 09/16/12 0648 09/16/12 0649 09/16/12 1326  BP: 119/51 119/50 83/37 122/47  Pulse: 87 86 95 75  Temp: 97.8 F (36.6 C)   97.6 F (36.4 C)  TempSrc: Oral   Oral  Resp: 16   18  Height:      Weight:      SpO2: 93%   99%    Intake/Output Summary (Last 24 hours) at 09/16/12 1433 Last data filed at 09/16/12 1318  Gross per 24 hour  Intake    800 ml  Output    850 ml  Net    -50 ml   Filed Weights   09/14/12 0655 09/15/12 0444 09/16/12 0500  Weight: 72.167 kg (159 lb 1.6 oz) 72.5 kg (159 lb 13.3 oz) 72.6 kg (160 lb 0.9 oz)    Exam:   General:  Pt in NAD, Alert and Awake  Cardiovascular: S1 and S2  WNL, no rubs  Respiratory: no increased work of breathing, no wheezes, Rhales BL at bases mild  Abdomen: soft, NT, ND  Musculoskeletal: no cyanosis or clubbing   Data Reviewed: Basic Metabolic Panel:  Recent Labs Lab 09/12/12 0500 09/13/12 0536 09/14/12 0510 09/15/12 0530 09/16/12 0500  NA 139 137 136 138 138  K 4.0 3.8 3.5 3.5 3.6  CL 90* 89* 90* 92* 93*  CO2 42* 44* 38* 38* 40*  GLUCOSE 108* 119* 119* 104* 88  BUN 36* 34* 40* 47* 53*  CREATININE 1.70* 1.70* 1.64* 1.77* 1.70*  CALCIUM 9.4 9.3 9.1 9.2 9.2    Liver Function Tests: No results found for this basename: AST, ALT, ALKPHOS, BILITOT, PROT, ALBUMIN,  in the last 168 hours No results found for this basename: LIPASE, AMYLASE,  in the last 168 hours No results found for this basename: AMMONIA,  in the last 168 hours CBC:  Recent Labs Lab 09/10/12 0639 09/11/12 0407 09/12/12 1740  WBC 5.3 6.0 6.4  HGB 7.4* 8.0* 9.4*  HCT 23.7* 25.3* 29.2*  MCV 91.2 91.0 89.0  PLT 312 333 319   Cardiac Enzymes: No results found for this basename: CKTOTAL, CKMB, CKMBINDEX, TROPONINI,  in the last 168 hours BNP (last 3 results)  Recent Labs  09/08/12 1823 09/12/12 0500 09/15/12 1030  PROBNP 707.4* 530.7* 514.5*   CBG: No results found for this basename: GLUCAP,  in the last 168 hours  Recent Results (from the past 240 hour(s))  BODY FLUID CULTURE     Status: None   Collection Time    09/10/12 10:23 AM      Result Value Range Status   Specimen Description PLEURAL FLUID RIGHT   Final   Special Requests 7.0ML FLUID   Final   Gram Stain     Final   Value: RARE WBC PRESENT, PREDOMINANTLY MONONUCLEAR     NO ORGANISMS SEEN   Culture NO GROWTH 3 DAYS   Final   Report Status 09/13/2012 FINAL   Final     Studies: Dg Chest 2 View  09/15/2012  *RADIOLOGY REPORT*  Clinical Data: CHF.  CHEST - 2 VIEW  Comparison: 09/11/2012  Findings: Moderate right pleural effusion.  Right lower lobe atelectasis or infiltrate, slightly improved.  Improving left basilar opacity.  Underlying COPD.  Heart is borderline in size. Small left pleural effusion on the lateral view, stable.  IMPRESSION: Moderate right pleural effusion and small left pleural effusion, stable.  Improving bibasilar opacities.  COPD.   Original Report Authenticated By: Charlett Nose, M.D.     Scheduled Meds: . beta carotene w/minerals  1 tablet Oral BID  . cholecalciferol  1,000 Units Oral q morning - 10a  . ferrous sulfate  325 mg Oral QHS  . folic acid  1 mg Oral QHS  . furosemide  40 mg  Oral Daily  . levothyroxine  150 mcg Oral QAC breakfast  . multivitamin with minerals  1 tablet Oral q morning - 10a  . pantoprazole  40 mg Oral Q1200  . simvastatin  10 mg Oral QHS  . sodium chloride  3 mL Intravenous Q12H  . sodium chloride  3 mL Intravenous Q12H  . traMADol  50 mg Oral BID   Continuous Infusions:   Active Problems:   Dyspnea   Hypothyroidism   Pleural effusion   Chronic respiratory failure with hypoxia   Pulmonary hypertension   Acute on chronic diastolic congestive heart failure   CKD (chronic kidney disease) stage 3,  GFR 30-59 ml/min   Atrial fibrillation   Anemia   Orthostatic hypotension    Time spent: > 35 minutes    Penny Pia  [facility] Pager [phone] If 7PM-7AM, please contact night-coverage at [email or web address], password [facility] 09/16/2012, 2:33 PM  LOS: 8 days

## 2012-09-16 NOTE — Progress Notes (Signed)
Subjective:  Mild orthostasis. Not really that SOB. .  Objective:  Vital Signs in the last 24 hours: BP 122/47  Pulse 75  Temp(Src) 97.6 F (36.4 C) (Oral)  Resp 18  Ht 5\' 5"  (1.651 m)  Wt 72.6 kg (160 lb 0.9 oz)  BMI 26.63 kg/m2  SpO2 99%  Physical Exam: Elderly WF in NAD Lungs:  Mild rales both bases Cardiac:  Regular rhythm, normal S1 and S2, no S3, 3/6 systolic murmur Extremities: No edema present  Intake/Output from previous day: 05/04 0701 - 05/05 0700 In: 763 [P.O.:760; I.V.:3] Out: 1175 [Urine:1175] Weight Filed Weights   09/14/12 0655 09/15/12 0444 09/16/12 0500  Weight: 72.167 kg (159 lb 1.6 oz) 72.5 kg (159 lb 13.3 oz) 72.6 kg (160 lb 0.9 oz)    Lab Results: Basic Metabolic Panel:  Recent Labs  96/04/54 0530 09/16/12 0500  NA 138 138  K 3.5 3.6  CL 92* 93*  CO2 38* 40*  GLUCOSE 104* 88  BUN 47* 53*  CREATININE 1.77* 1.70*    CBC:   BNP    Component Value Date/Time   PROBNP 514.5* 09/15/2012 1030    PROTIME: Lab Results  Component Value Date   INR 1.70* 09/10/2012   INR 2.15* 09/09/2012   INR 2.28* 09/08/2012    Telemetry: Sinus with first degree block and PVC's  Assessment/Plan:  1. Chronic diastolic CHF due to valvular heart disease and LVH worsened by reduction in diuretic dose and anemia  2. Severe anemia better  3. Stage 3 CKD 4. Mitral and aortic valve disease 5.  Not entirely clear that effusions are all CHF, may have some sort of pulmonary process going on 6. Contraction alkalosis currently off of Diamox 7. Orthostasis better  Rec:  She will need to go home on a dose of diuretic to avoid readmission.  She also will need a higher creatinine. I would have herwear support stockings and  Also try to avoid lying in bed as this will aggravate orthostasis. From cardiac viewpoint is very close to going home.  Darden Palmer  MD [facility] Cardiology  09/16/2012, 1:50 PM

## 2012-09-16 NOTE — Plan of Care (Signed)
Problem: Phase II Progression Outcomes Goal: Dyspnea controlled with activity Outcome: Not Applicable Date Met:  09/16/12 Pt is home 02 dependent

## 2012-09-17 LAB — BASIC METABOLIC PANEL
CO2: 38 mEq/L — ABNORMAL HIGH (ref 19–32)
Glucose, Bld: 92 mg/dL (ref 70–99)
Potassium: 3.7 mEq/L (ref 3.5–5.1)
Sodium: 138 mEq/L (ref 135–145)

## 2012-09-17 MED ORDER — FUROSEMIDE 40 MG PO TABS: 40.0000 mg | ORAL_TABLET | Freq: Two times a day (BID) | ORAL | Status: DC

## 2012-09-17 MED ORDER — LEVOTHYROXINE SODIUM 150 MCG PO TABS: 150.0000 ug | ORAL_TABLET | Freq: Every day | ORAL | Status: DC

## 2012-09-17 NOTE — Discharge Planning (Signed)
When pt. Walked in halls without Oxygen, pt.'s sats dropped to 88%.  Placed pt. On O2 at 2LPM [place] and sats increased to 98%.   Tera Mater, RN, BSN NCM [phone]

## 2012-09-17 NOTE — Progress Notes (Signed)
Subjective:  No orthostasis noted.  Not SOB.  Weight has been stable.  Objective:  Vital Signs in the last 24 hours: BP 122/42  Pulse 83  Temp(Src) 98.2 F (36.8 C) (Oral)  Resp 18  Ht 5\' 5"  (1.651 m)  Wt 72.938 kg (160 lb 12.8 oz)  BMI 26.76 kg/m2  SpO2 97%  Physical Exam: Elderly WF in NAD Lungs:  Mild rales both bases Cardiac:  Regular rhythm, normal S1 and S2, no S3, 3/6 systolic murmur Extremities: No edema present  Intake/Output from previous day: 05/05 0701 - 05/06 0700 In: 713 [P.O.:710; I.V.:3] Out: 1050 [Urine:1050] Weight Filed Weights   09/15/12 0444 09/16/12 0500 09/17/12 0531  Weight: 72.5 kg (159 lb 13.3 oz) 72.6 kg (160 lb 0.9 oz) 72.938 kg (160 lb 12.8 oz)    Lab Results: Basic Metabolic Panel:  Recent Labs  16/10/96 0500 09/17/12 0510  NA 138 138  K 3.6 3.7  CL 93* 94*  CO2 40* 38*  GLUCOSE 88 92  BUN 53* 52*  CREATININE 1.70* 1.59*    CBC:   BNP    Component Value Date/Time   PROBNP 514.5* 09/15/2012 1030    PROTIME: Lab Results  Component Value Date   INR 1.70* 09/10/2012   INR 2.15* 09/09/2012   INR 2.28* 09/08/2012    Telemetry: Sinus with first degree block and PVC's  Assessment/Plan:  1. Chronic diastolic CHF due to valvular heart disease and LVH worsened by reduction in diuretic dose and anemia on admission - currently better 2. Severe anemia better post 1 unit of blood 3. Stage 3 CKD 4. Mitral and aortic valve disease 5.  Not entirely clear that effusions are all CHF, may have some sort of pulmonary process going on 6. Orthostasis better and I think could go home  Rec:  From a cardiac viewpoint OK to go home.  I would send her home on Lasix 40 mg BID and I will need to see her early next week.  She should have home health care and also weigh daily.   Darden Palmer  MD [facility] Cardiology  09/17/2012, 1:08 PM

## 2012-09-17 NOTE — Progress Notes (Signed)
09/17/12 1415 In to speak with pt. about discharge planning.  Pt. has home continuous oxygen at 3LPM [place] with [facility].  Pt. has had [facility] in past and was satisfied with their services.  TC to Lupita Leash, with [facility], to give referral for [facility] RN, and [facility] aide.  Pt. to dc home today with daughter.  Tera Mater, RN, BSN NCM [phone]

## 2012-09-17 NOTE — Progress Notes (Signed)
DC IV, DC Tele, DC Home. Discharge instructions and home medications discussed with patient and patient's family members. Patient and family denied any questions or concerns at this time. Patient leaving unit via wheelchair with O2 and appears in no acute distress.

## 2012-09-17 NOTE — Discharge Summary (Signed)
Physician Discharge Summary  [name] ZOX:[id number] DOB: [birth year] DOA: 09/08/2012  PCP: [name], MD  Admit date: 09/08/2012 Discharge date: 09/17/2012  Time spent: > 40 minutes  Recommendations for Outpatient Follow-up:  1. Please be sure to follow up with Cardiologist 2. Creatinine should be 1.4-1.6 3. Cardiology recommending lasix 40 mg po bid 4. Set up appointment for GI to follow up with h/o GI bleed.  Discharge Diagnoses:  Active Problems:   Dyspnea   Hypothyroidism   Pleural effusion   Chronic respiratory failure with hypoxia   Pulmonary hypertension   Acute on chronic diastolic congestive heart failure   CKD (chronic kidney disease) stage 3, GFR 30-59 ml/min   Atrial fibrillation   Anemia   Orthostatic hypotension   Discharge Condition: stable  Diet recommendation: heart healthy diet  Filed Weights   09/15/12 0444 09/16/12 0500 09/17/12 0531  Weight: 72.5 kg (159 lb 13.3 oz) 72.6 kg (160 lb 0.9 oz) 72.938 kg (160 lb 12.8 oz)    History of present illness:  From original HPI: MADISYN MAWHINNEY is a 79 y.o. female with known history of diastolic CHF last EF measured was 55% on December 2013 mitral fibrillation on Coumadin, recurrent pleural effusion transudative had required thoracentesis previously, aortic stenosis/mitral regurgitation and stenosis, chronic kidney disease presented to the ER because of worsening shortness of breath. Patient has shortness of breath on exertion and increased presently on minimal exertion. Denies any fever chills chest pain. In the ER chest x-ray revealed increasing pleural effusion and also showed worsening hemoglobin. Patient states she has poor vision and not sure if she has been having rectal bleed. Patient has not used any NSAIDs. Stool for a blood was positive but ER physician status was not black but brown in color. Last 2 weeks patient's Lasix dose was decreased to 20 mg daily because of worsening kidney function. At this time  patient was given one dose of Lasix 40 mg IV and will be admitted for further management of her CHF.   Hospital Course:   1. CHF with h/o of diastolic CHF, mitral stenosis and mitral regurgitation with last EF measured at 55 percent. - Cardiology on board and managing. Will defer management to them and follow up with their recommendations.  - Thoracentesis performed while in house and patient currently feeling better. - Lasix on discharge recommended for lasix 40 mg po bid, also creatinine should be from 1.4 to 1.6 - EF 55-60% with no regional wall motion abnormality.   2. Recurrent transudative pleural effusions (first mentioned in 2010 on a CT scan of the chest)  transudate per current work up  [employer] on board and making further recommendations.  - If pleural fluid accumulates rapidly patient may consider Pleur-x catheter  - Pleural fluid cytology 4/29 reported as no malignant cells identified.  - Plan on repeating chest x ray if worsening SOB.   3. Anemia with stool occult blood positive  - Gi on board and following.  - Patient while in house was transfused with PRBC. Has not had any active bleeding and last hemoglobin 9.4 - Recommends continuing PPI. No plans for EGD at this juncture  - Most likely I would favor having patient f/u with her GI doctor as outpatient, unless otherwise recommended by other physicians involved.   4. H/o Afib  - Plan on continuing Cardizem. Hold coumadin given # 3  Cardizem not continued given orthostatic hypotension while in house.  5. Hypothyroidism  - TSH recently increased  with last check showing TSH level of 11.472. Synthroid increased from 137 mcg to 150 mcg daily while in house.   - Once discharged will need to get TSH levels rechecked in 6 wks   6. CKD  - will need creatinine rechecked as outpatient - Creatinine goal should be 1.4-1.6  7. Orthostatic Hypotension  - New problem which has been affecting disposition.  - No red flags  reported on telemetry  - Still having orthostatic hypotension currently but much improved off of cardizem  - Cardiology recommending holding cardizem.  - Patient feels improved when standing and denies any weakness  Procedures: 2 D echocardiogram  Consultations:  Dr. Linford Arnold Irwin Brakeman Siqueiros  Discharge Exam: Ceasar Mons Vitals:   09/16/12 2050 09/17/12 0531 09/17/12 0954 09/17/12 1338  BP: 134/48 118/49 122/42 110/46  Pulse: 76 83 83 80  Temp: 97.4 F (36.3 C) 98.2 F (36.8 C)  97.3 F (36.3 C)  TempSrc: Oral Oral  Oral  Resp: 18 18  18   Height:      Weight:  72.938 kg (160 lb 12.8 oz)    SpO2: 99% 97%  99%    General: Pt in NAD, Alert and Awake Cardiovascular: RRR, no rubs Respiratory: rhales at bases BL, no wheezes  Discharge Instructions  Discharge Orders   Future Orders Complete By Expires     (HEART FAILURE PATIENTS) Call MD:  Anytime you have any of the following symptoms: 1) 3 pound weight gain in 24 hours or 5 pounds in 1 week 2) shortness of breath, with or without a dry hacking cough 3) swelling in the hands, feet or stomach 4) if you have to sleep on extra pillows at night in order to breathe.  As directed     Call MD for:  temperature >100.4  As directed     Diet - low sodium heart healthy  As directed     Discharge instructions  As directed     Comments:      Patient is to follow up with Dr. Donnie Aho early next week (monday or Tuesday).    Increase activity slowly  As directed         Medication List    STOP taking these medications       diltiazem 180 MG 24 hr capsule  Commonly known as:  CARDIZEM CD     warfarin 5 MG tablet  Commonly known as:  COUMADIN      TAKE these medications       beta carotene w/minerals tablet  Take 1 tablet by mouth 2 (two) times daily.     cholecalciferol 1000 UNITS tablet  Commonly known as:  VITAMIN D  Take 1,000 Units by mouth every morning.     folic acid 1 MG tablet  Commonly known as:  FOLVITE   Take 1 mg by mouth at bedtime.     furosemide 40 MG tablet  Commonly known as:  LASIX  Take 1 tablet (40 mg total) by mouth 2 (two) times daily.     glucosamine-chondroitin 500-400 MG tablet  Take 1 tablet by mouth 2 (two) times daily.     Iron 325 (65 FE) MG Tabs  Take 1 tablet by mouth at bedtime.     levothyroxine 150 MCG tablet  Commonly known as:  SYNTHROID, LEVOTHROID  Take 1 tablet (150 mcg total) by mouth daily before breakfast.     multivitamin with minerals Tabs  Take 1 tablet by mouth every morning.  omeprazole 40 MG capsule  Commonly known as:  PRILOSEC  Take 40 mg by mouth every morning.     simvastatin 10 MG tablet  Commonly known as:  ZOCOR  Take 10 mg by mouth at bedtime.     traMADol 50 MG tablet  Commonly known as:  ULTRAM  Take 50 mg by mouth 2 (two) times daily.       Allergies  Allergen Reactions  . Lisinopril Other (See Comments)    pancreatitis       Follow-up Information   Follow up with TILLEY JR,W SPENCER, MD. Schedule an appointment as soon as possible for a visit in 1 week. (Please get appointment next Monday or Tuesday)    Contact information:   [address] [place] [address] [phone]        The results of significant diagnostics from this hospitalization (including imaging, microbiology, ancillary and laboratory) are listed below for reference.    Significant Diagnostic Studies: Dg Chest 2 View  09/15/2012  *RADIOLOGY REPORT*  Clinical Data: CHF.  CHEST - 2 VIEW  Comparison: 09/11/2012  Findings: Moderate right pleural effusion.  Right lower lobe atelectasis or infiltrate, slightly improved.  Improving left basilar opacity.  Underlying COPD.  Heart is borderline in size. Small left pleural effusion on the lateral view, stable.  IMPRESSION: Moderate right pleural effusion and small left pleural effusion, stable.  Improving bibasilar opacities.  COPD.   Original Report Authenticated By: Charlett Nose, M.D.     Dg Chest 2 View  09/11/2012  *RADIOLOGY REPORT*  Clinical Data: Right pleural effusion  CHEST - 2 VIEW  Comparison: September 10, 2012.  Findings: Bilateral pleural effusions are again noted with right greater than left.  These are not significantly changed compared to prior exam.  Slightly increased right basilar opacity is noted concerning for edema or pneumonia.  No pneumothorax is noted.  Bony thorax is intact.  IMPRESSION: Stable bilateral pleural effusions with right greater than left. Slightly increased right lower lobe opacity is noted concerning for edema or possibly pneumonia.   Original Report Authenticated By: Lupita Raider.,  M.D.    Dg Chest 2 View  09/08/2012  *RADIOLOGY REPORT*  Clinical Data: Short of breath.  Right-sided back pain.  Irregular heart beat.  CHEST - 2 VIEW  Comparison: 09/02/2012.  Findings: Slight increase in the right pleural effusion which now layers more dependently.  Increasing left basilar subsegmental atelectasis.  Pulmonary vascular congestion.  The findings suggest volume overload/CHF.  Cardiopericardial silhouette appears similar.  IMPRESSION: Increasing right pleural effusion, now moderate.  Associated atelectasis and increasing atelectasis at the left lung base. Pulmonary vascular congestion with cephalization of blood flow suggest volume overload.   Original Report Authenticated By: Andreas Newport, M.D.    Dg Chest 2 View  09/02/2012  *RADIOLOGY REPORT*  Clinical Data: Pleural effusion.  Shortness of breath.  CHEST - 2 VIEW  Comparison: PA and lateral chest 07/17/2012.  Findings: Right pleural effusion has increased in size since the prior study and now fills approximately one half of the right chest.  No left effusion is identified.  There is no pneumothorax. Basilar airspace disease on the right is likely due to compressive atelectasis.  Heart size is normal.  IMPRESSION: Increased right pleural effusion.   Original Report Authenticated By: Holley Dexter,  M.D.    Dg Chest Port 1 View  09/10/2012  *RADIOLOGY REPORT*  Clinical Data: Follow-up right thoracentesis.  PORTABLE CHEST - 1 VIEW  Comparison: 09/08/2012  Findings: Decreasing right pleural effusion following thoracentesis.  No pneumothorax.  Small right pleural effusion with right lower lobe airspace disease persists.  Stable left basilar atelectasis or scarring.  Underlying COPD.  IMPRESSION: Decreasing right pleural effusion and right lower lobe airspace disease following thoracentesis.  No pneumothorax.   Original Report Authenticated By: Charlett Nose, M.D.     Microbiology: Recent Results (from the past 240 hour(s))  BODY FLUID CULTURE     Status: None   Collection Time    09/10/12 10:23 AM      Result Value Range Status   Specimen Description PLEURAL FLUID RIGHT   Final   Special Requests 7.0ML FLUID   Final   Gram Stain     Final   Value: RARE WBC PRESENT, PREDOMINANTLY MONONUCLEAR     NO ORGANISMS SEEN   Culture NO GROWTH 3 DAYS   Final   Report Status 09/13/2012 FINAL   Final     Labs: Basic Metabolic Panel:  Recent Labs Lab 09/13/12 0536 09/14/12 0510 09/15/12 0530 09/16/12 0500 09/17/12 0510  NA 137 136 138 138 138  K 3.8 3.5 3.5 3.6 3.7  CL 89* 90* 92* 93* 94*  CO2 44* 38* 38* 40* 38*  GLUCOSE 119* 119* 104* 88 92  BUN 34* 40* 47* 53* 52*  CREATININE 1.70* 1.64* 1.77* 1.70* 1.59*  CALCIUM 9.3 9.1 9.2 9.2 9.2   Liver Function Tests: No results found for this basename: AST, ALT, ALKPHOS, BILITOT, PROT, ALBUMIN,  in the last 168 hours No results found for this basename: LIPASE, AMYLASE,  in the last 168 hours No results found for this basename: AMMONIA,  in the last 168 hours CBC:  Recent Labs Lab 09/11/12 0407 09/12/12 1740  WBC 6.0 6.4  HGB 8.0* 9.4*  HCT 25.3* 29.2*  MCV 91.0 89.0  PLT 333 319   Cardiac Enzymes: No results found for this basename: CKTOTAL, CKMB, CKMBINDEX, TROPONINI,  in the last 168 hours BNP: BNP (last 3 results)  Recent  Labs  09/08/12 1823 09/12/12 0500 09/15/12 1030  PROBNP 707.4* 530.7* 514.5*   CBG: No results found for this basename: GLUCAP,  in the last 168 hours     Signed:  Penny Pia  [facility] 09/17/2012, 1:43 PM

## 2012-09-17 NOTE — Progress Notes (Deleted)
09/17/12 1145 In to speak with pt. about discharge planning and medication assistance.  Pt. does not have any insurance at this time, and is interested in medication assistance.  Pt. qualifies for the [facility] program.  NCM will give pt. MATCH letter that will afford the pt. 34days of medications for a $3 co-pay/ RX.  Pt. will have to use a specific pharmacies listed on the [facility] letter, and the pt. has to use the letter within 7days of dc.  Pt. to dc home today.  Tera Mater, RN, BSN NCM [phone]

## 2012-09-17 NOTE — Progress Notes (Signed)
Physical Therapy Treatment Patient Details Name: ZEINA AKKERMAN MRN: [id number] DOB: [birth year] Today's Date: 09/17/2012 Time: 0130-0147 PT Time Calculation (min): 17 min  PT Assessment / Plan / Recommendation Comments on Treatment Session  Pt cont's to move well.  No dizziness reported with any transitional movements/activity.  Pt eager to d/c home.      Follow Up Recommendations  No PT follow up;Supervision/Assistance - 24 hour     Does the patient have the potential to tolerate intense rehabilitation     Barriers to Discharge        Equipment Recommendations       Recommendations for Other Services    Frequency Min 2X/week   Plan Discharge plan remains appropriate    Precautions / Restrictions Precautions Precautions: Fall Precaution Comments: Pt on 2L 02 entire session Restrictions Weight Bearing Restrictions: No    Pertinent Vitals/Pain 94% 2L supplemental 02 with ambulation    Mobility  Bed Mobility Bed Mobility: Supine to Sit;Sitting - Scoot to Edge of Bed Supine to Sit: 7: Independent Sitting - Scoot to Edge of Bed: 7: Independent Transfers Transfers: Sit to Stand;Stand to Sit Sit to Stand: 5: Supervision;With upper extremity assist;From bed Stand to Sit: 5: Supervision;With upper extremity assist;With armrests;To chair/3-in-1 Details for Transfer Assistance: Performed with increased ease today Ambulation/Gait Ambulation/Gait Assistance: 5: Supervision Ambulation Distance (Feet): 200 Feet Assistive device: Rolling walker Ambulation/Gait Assistance Details: cues for safety when stepping over threshold.  Pt steady.   Gait Pattern: Step-through pattern;Decreased stride length Stairs: No      PT Goals Acute Rehab PT Goals Time For Goal Achievement: 09/17/12 Potential to Achieve Goals: Good Pt will go Supine/Side to Sit: Independently;with HOB 0 degrees PT Goal: Supine/Side to Sit - Progress: Met Pt will go Sit to Stand: Independently;with upper extremity  assist PT Goal: Sit to Stand - Progress: Progressing toward goal Pt will Ambulate: >150 feet;with modified independence;with least restrictive assistive device PT Goal: Ambulate - Progress: Progressing toward goal Pt will Go Up / Down Stairs: 3-5 stairs;with supervision;with rail(s)  Visit Information  Last PT Received On: 09/17/12 Assistance Needed: +1    Subjective Data      Cognition  Cognition Arousal/Alertness: Awake/alert Behavior During Therapy: WFL for tasks assessed/performed Overall Cognitive Status: Within Functional Limits for tasks assessed    Balance     End of Session PT - End of Session Equipment Utilized During Treatment: Gait belt;Oxygen Activity Tolerance: Patient tolerated treatment well Patient left: in chair;with call bell/phone within reach Nurse Communication: Mobility status     Verdell Face, [place] [phone] 09/17/2012

## 2012-10-11 ENCOUNTER — Telehealth: Payer: Self-pay | Admitting: Internal Medicine

## 2012-10-11 NOTE — Telephone Encounter (Signed)
Dr. Sherene Sires is this okay with you? Please advise thanks!

## 2012-10-12 NOTE — Telephone Encounter (Signed)
Fine with me

## 2012-10-14 NOTE — Telephone Encounter (Signed)
Spoke with patients daughter-- Patient has been scheduled to see MR July 2 @345pm  (first available) Bjorn Loser at [facility] is aware as well Nothing further needed at this time.

## 2012-10-14 NOTE — Telephone Encounter (Signed)
That is fine with me. Patient can see me. Give first available; I think  Needs 30 min slot   Dr. Kalman Shan, M.D., [facility].C.P Pulmonary and Critical Care Medicine Staff Physician [facility]monary [facility] Critical [facility] Pager: [phone], If no answer or between  15:00h - 7:00h: call [phone]  10/14/2012 8:50 AM

## 2012-10-14 NOTE — Telephone Encounter (Signed)
Dr. Marchelle Gearing, are you okay with the switch? thanks

## 2012-10-15 ENCOUNTER — Ambulatory Visit: Payer: Medicare Other | Admitting: Internal Medicine

## 2012-10-24 ENCOUNTER — Telehealth: Payer: Self-pay | Admitting: Internal Medicine

## 2012-10-24 NOTE — Telephone Encounter (Signed)
Spoke with Samara Deist at [facility]  She states needing order sent to them for o2 She states that that was per the pt's request\, but I want to verify this her [facility] for the pt

## 2012-10-24 NOTE — Telephone Encounter (Signed)
LMTCB

## 2012-10-25 NOTE — Telephone Encounter (Signed)
ATC patient to verify this msg, patient is currently busy. LM with family member for patient to return our offices call

## 2012-10-28 NOTE — Telephone Encounter (Signed)
Spoke with patient and she states that at this time her Insurance is not wanting to cover for O2 Concentrator through her DME comp [employer]). Patient states that she is going to have to hold off on getting the concentrator at this time until they can work something out with her DME company about a cheaper machine or can come up with the money to pay for it out of pocket.   Will send to MW as Lorain Childes of patients progress with O2 Concentrator.

## 2012-10-28 NOTE — Telephone Encounter (Signed)
Not clear where the problem lies here  - ONO RA 03/05/12 > 267 min @ < 89% so rec 2lpm and repeat ono on 2lpm 03/06/2012 > resolved by ono 2lpm 03/21/12  - 07/17/2012 02 sat 87% at rest then Walked 2lpm x one lap @ 185 stopped due to desat 84%> resolved on 4lpm   Will send to [place] to sort out

## 2012-11-13 ENCOUNTER — Institutional Professional Consult (permissible substitution): Payer: Medicare Other | Admitting: Internal Medicine

## 2012-11-14 ENCOUNTER — Ambulatory Visit (INDEPENDENT_AMBULATORY_CARE_PROVIDER_SITE_OTHER): Payer: Medicare Other | Admitting: Internal Medicine

## 2012-11-14 ENCOUNTER — Other Ambulatory Visit (INDEPENDENT_AMBULATORY_CARE_PROVIDER_SITE_OTHER): Payer: Medicare Other

## 2012-11-14 ENCOUNTER — Encounter: Payer: Self-pay | Admitting: Internal Medicine

## 2012-11-14 VITALS — BP 122/60 | HR 80 | Temp 96.8°F | Ht 64.5 in | Wt 166.0 lb

## 2012-11-14 DIAGNOSIS — J90 Pleural effusion, not elsewhere classified: Secondary | ICD-10-CM

## 2012-11-14 NOTE — Patient Instructions (Addendum)
Unclear cause of pleural effusion Conitnue oxygen for now Maybe thyroid is the problem +/- heart issues is the problem Do blood test to rule out autoimmune disease Do 24h urine protein collection to rule out renal cause IF keeps recurring and we do not find  A cause- discussed pleurX catheter and this might be best solution to help shortness of breah Will call you with test results to decide next step

## 2012-11-14 NOTE — Assessment & Plan Note (Signed)
Unclear cause of pleural effusion Conitnue oxygen for now Maybe thyroid is the problem +/- heart issues is the problem Do blood test to rule out autoimmune disease Do 24h urine protein collection to rule out renal cause IF keeps recurring and we do not find  A cause- discussed pleurX catheter and this might be best solution to help shortness of breah Will call you with test results to decide next step Till then continue diureticsd

## 2012-11-14 NOTE — Progress Notes (Signed)
Subjective:    Patient ID: [name], female    DOB: 1924/09/17, 79 y.o.   MRN: [id number] PCP [name], MD  HPI  79 year old female. Former smoker. Mentally sharp thugh hard of hearing. Presents with daughter. Switiching care from  Dr MW to me (MR) Main concern  Is chronic right pleural efuffusion for years and associted dyspnea and associated hypoxemia on oxygen since dec 2013   Has chronic right pleural effusion since July 2010 per my review of imaging (cxr feb 2010 ws clear).  Transudative effusion per thoracenttesis Dec 2013, Jan 2014 and April 2014 thora with non-diagnostic cytology each time and negative flow cytometry in Jan 2014 Normal liver on [place] Feb 2010 and CT abdomen June 2011 Ana negative - dec 2013 ECHO April 2014  - Mild to Moderate mitral stenosis but normal EF Chronic diastolic CHF due to valvulaer heart dz - per DR Donnie Aho notes May 2014 KNown to have chronic renal insuff - creat  1.6gm% And gfr 30s. No hx of proetinuria check Known to have hypothuodisim - April 2014 TSH 11.47 and synthroid increased  This results in class 3 dyspnea and also says she desaturates when she stands up. REview of records show she has exertional desaturations that correct with o2     Past Medical History  Diagnosis Date  . Stroke   . Anemia   . Hyperlipidemia   . Vitamin D deficiency   . Hypothyroid   . Pulmonary hypertension   . Chronic respiratory failure with hypoxia   . Blindness of right eye     Macular degeneration  . Macular degeneration   . Hypertensive heart disease 04/16/2012  . CHF (congestive heart failure)   . Hypertension   . UTI (urinary tract infection)   . Arthritis     KNEES     Family History  Problem Relation Age of Onset  . Heart disease Mother   . Heart disease Father   . Heart disease Brother   . Breast cancer Daughter   . Lymphoma Brother      History   Social History  . Marital Status: Married    Spouse Name: N/A    Number of Children:  N/A  . Years of Education: N/A   Occupational History  . Not on file.   Social History Main Topics  . Smoking status: Former Smoker -- 0.50 packs/day for 50 years    Types: Cigarettes    Quit date: 05/15/1998  . Smokeless tobacco: Never Used  . Alcohol Use: No  . Drug Use: No  . Sexually Active: No   Other Topics Concern  . Not on file   Social History Narrative  . No narrative on file     Allergies  Allergen Reactions  . Lisinopril Other (See Comments)    pancreatitis     Outpatient Prescriptions Prior to Visit  Medication Sig Dispense Refill  . beta carotene w/minerals (OCUVITE) tablet Take 1 tablet by mouth 2 (two) times daily.       . cholecalciferol (VITAMIN D) 1000 UNITS tablet Take 1,000 Units by mouth every morning.       . folic acid (FOLVITE) 1 MG tablet Take 1 mg by mouth at bedtime.       . furosemide (LASIX) 40 MG tablet Take 1 tablet (40 mg total) by mouth 2 (two) times daily.  60 tablet  0  . glucosamine-chondroitin 500-400 MG tablet Take 1 tablet by mouth 2 (two) times daily.       Marland Kitchen  levothyroxine (SYNTHROID, LEVOTHROID) 150 MCG tablet Take 1 tablet (150 mcg total) by mouth daily before breakfast.  30 tablet  0  . Multiple Vitamin (MULITIVITAMIN WITH MINERALS) TABS Take 1 tablet by mouth every morning.       Marland Kitchen omeprazole (PRILOSEC) 40 MG capsule Take 40 mg by mouth every morning.       . simvastatin (ZOCOR) 10 MG tablet Take 10 mg by mouth at bedtime.      . traMADol (ULTRAM) 50 MG tablet Take 50 mg by mouth 2 (two) times daily.       . Ferrous Sulfate (IRON) 325 (65 FE) MG TABS Take 1 tablet by mouth at bedtime.        No facility-administered medications prior to visit.      Review of Systems  Constitutional: Negative for fever and unexpected weight change.  HENT: Negative for ear pain, nosebleeds, congestion, sore throat, rhinorrhea, sneezing, trouble swallowing, dental problem, postnasal drip and sinus pressure.   Eyes: Negative for redness and  itching.  Respiratory: Positive for shortness of breath. Negative for cough, chest tightness and wheezing.   Cardiovascular: Positive for palpitations. Negative for leg swelling.  Gastrointestinal: Negative for nausea and vomiting.  Genitourinary: Negative for dysuria.  Musculoskeletal: Positive for joint swelling and arthralgias.  Skin: Negative for rash.  Neurological: Negative for headaches.  Hematological: Does not bruise/bleed easily.  Psychiatric/Behavioral: Negative for dysphoric mood. The patient is not nervous/anxious.        Objective:   Physical Exam  Vitals reviewed. Constitutional: She is oriented to person, place, and time. She appears well-developed and well-nourished. No distress.  HENT:  Head: Normocephalic and atraumatic.  Right Ear: External ear normal.  Left Ear: External ear normal.  Mouth/Throat: Oropharynx is clear and moist. No oropharyngeal exudate.  oxyen on  Eyes: Conjunctivae and EOM are normal. Pupils are equal, round, and reactive to light. Right eye exhibits no discharge. Left eye exhibits no discharge. No scleral icterus.  Neck: Normal range of motion. Neck supple. No JVD present. No tracheal deviation present. No thyromegaly present.  Cardiovascular: Normal rate, regular rhythm, normal heart sounds and intact distal pulses.  Exam reveals no gallop and no friction rub.   No murmur heard. Pulmonary/Chest: Effort normal. No respiratory distress. She has no wheezes. She has rales. She exhibits no tenderness.  dimished rt base  Abdominal: Soft. Bowel sounds are normal. She exhibits no distension and no mass. There is no tenderness. There is no rebound and no guarding.  Musculoskeletal: Normal range of motion. She exhibits no edema and no tenderness.  Frail somewhat  Lymphadenopathy:    She has no cervical adenopathy.  Neurological: She is alert and oriented to person, place, and time. She has normal reflexes. No cranial nerve deficit. She exhibits normal  muscle tone. Coordination normal.  Skin: Skin is warm and dry. No rash noted. She is not diaphoretic. No erythema. No pallor.  Psychiatric: She has a normal mood and affect. Her behavior is normal. Judgment and thought content normal.          Assessment & Plan:

## 2012-11-18 ENCOUNTER — Other Ambulatory Visit: Payer: Medicare Other

## 2012-11-18 DIAGNOSIS — J90 Pleural effusion, not elsewhere classified: Secondary | ICD-10-CM

## 2012-11-18 LAB — CYCLIC CITRUL PEPTIDE ANTIBODY, IGG: Cyclic Citrullin Peptide Ab: <2 U/mL (ref 0.0–5.0)

## 2012-11-19 LAB — PROTEIN, URINE, 24 HOUR
Protein, 24H Urine: 16 mg/d — ABNORMAL LOW (ref 50–100)
Protein, Urine: 4 mg/dL

## 2012-11-20 LAB — ANTI-DNA ANTIBODY, DOUBLE-STRANDED: ds DNA Ab: 88 IU/mL — ABNORMAL HIGH (ref <30)

## 2012-11-27 ENCOUNTER — Telehealth: Payer: Self-pay | Admitting: Internal Medicine

## 2012-11-27 NOTE — Telephone Encounter (Signed)
Please let daughter know that blood test (autoimmune) and urine test (24h protein colelction) are normal. So this means we fully do not understand cause of the fluid colelcton in right chest beyond the fact is leaky cause.   Options A- watch for another 3 months and see if thyroid medications helps (more preferred)  Or  B) do procedure to place permanent chest tuber (less preferred)  Depending on their response give appt   Dr. Kalman Shan, M.D., [facility].C.P Pulmonary and Critical Care Medicine Staff Physician [facility] Pulmonary and Critical Care Pager: [phone], If no answer or between  15:00h - 7:00h: call [phone]  11/27/2012 6:53 AM

## 2012-12-04 NOTE — Telephone Encounter (Signed)
I spoke with the pt daughter and advised of results and she states she will watch the pt for the 3 months. Reminder placed for appt in 3 months. Carron Curie, CMA

## 2012-12-10 ENCOUNTER — Other Ambulatory Visit: Payer: Self-pay | Admitting: *Deleted

## 2012-12-10 ENCOUNTER — Other Ambulatory Visit (INDEPENDENT_AMBULATORY_CARE_PROVIDER_SITE_OTHER): Payer: Medicare Other

## 2012-12-10 DIAGNOSIS — E7800 Pure hypercholesterolemia, unspecified: Secondary | ICD-10-CM

## 2012-12-10 DIAGNOSIS — E039 Hypothyroidism, unspecified: Secondary | ICD-10-CM

## 2012-12-10 DIAGNOSIS — I10 Essential (primary) hypertension: Secondary | ICD-10-CM

## 2012-12-10 DIAGNOSIS — D649 Anemia, unspecified: Secondary | ICD-10-CM

## 2012-12-10 LAB — CBC
HCT: 33.1 % — ABNORMAL LOW (ref 36.0–46.0)
MCV: 86.6 fl (ref 78.0–100.0)
RDW: 15.7 % — ABNORMAL HIGH (ref 11.5–14.6)
WBC: 7.5 10*3/uL (ref 4.5–10.5)

## 2012-12-10 LAB — LIPID PANEL
HDL: 100.2 mg/dL (ref >39.00)
Total CHOL/HDL Ratio: 2
Triglycerides: 50 mg/dL (ref 0.0–149.0)
VLDL: 10 mg/dL (ref 0.0–40.0)

## 2012-12-10 LAB — T4: T4, Total: 11.7 ug/dL (ref 5.0–12.5)

## 2012-12-10 LAB — COMPREHENSIVE METABOLIC PANEL
ALT: 15 U/L (ref 0–35)
AST: 28 U/L (ref 0–37)
Alkaline Phosphatase: 97 U/L (ref 39–117)
BUN: 40 mg/dL — ABNORMAL HIGH (ref 6–23)
Creatinine, Ser: 2 mg/dL — ABNORMAL HIGH (ref 0.4–1.2)

## 2012-12-10 LAB — LDL CHOLESTEROL, DIRECT: Direct LDL: 94.1 mg/dL

## 2012-12-10 LAB — TSH: TSH: 3.37 u[IU]/mL (ref 0.35–5.50)

## 2012-12-12 ENCOUNTER — Ambulatory Visit
Admission: RE | Admit: 2012-12-12 | Discharge: 2012-12-12 | Disposition: A | Discharge status: Home / Self Care | Payer: Medicare Other | Source: Ambulatory Visit | Attending: Endocrinology | Admitting: Endocrinology

## 2012-12-12 ENCOUNTER — Encounter: Payer: Self-pay | Admitting: Endocrinology

## 2012-12-12 ENCOUNTER — Ambulatory Visit (INDEPENDENT_AMBULATORY_CARE_PROVIDER_SITE_OTHER): Payer: Medicare Other | Admitting: Endocrinology

## 2012-12-12 ENCOUNTER — Other Ambulatory Visit: Payer: Self-pay | Admitting: Endocrinology

## 2012-12-12 VITALS — BP 116/56 | HR 72 | Resp 12 | Ht 64.5 in | Wt 161.8 lb

## 2012-12-12 DIAGNOSIS — R071 Chest pain on breathing: Secondary | ICD-10-CM

## 2012-12-12 DIAGNOSIS — R0789 Other chest pain: Secondary | ICD-10-CM

## 2012-12-12 DIAGNOSIS — R0902 Hypoxemia: Secondary | ICD-10-CM

## 2012-12-12 DIAGNOSIS — N183 Chronic kidney disease, stage 3 unspecified: Secondary | ICD-10-CM

## 2012-12-12 DIAGNOSIS — E039 Hypothyroidism, unspecified: Secondary | ICD-10-CM

## 2012-12-12 DIAGNOSIS — G4734 Idiopathic sleep related nonobstructive alveolar hypoventilation: Secondary | ICD-10-CM

## 2012-12-12 DIAGNOSIS — D649 Anemia, unspecified: Secondary | ICD-10-CM

## 2012-12-12 DIAGNOSIS — J90 Pleural effusion, not elsewhere classified: Secondary | ICD-10-CM

## 2012-12-12 DIAGNOSIS — E785 Hyperlipidemia, unspecified: Secondary | ICD-10-CM

## 2012-12-12 MED ORDER — BACLOFEN 10 MG PO TABS: 10.0000 mg | ORAL_TABLET | ORAL | Status: DC | PRN

## 2012-12-12 NOTE — Patient Instructions (Signed)
Stay on same meds

## 2012-12-12 NOTE — Progress Notes (Signed)
Patient ID: [name], female   DOB: [birth year], 79 y.o.   MRN: [id number]  Chief complaint: pain in back  HISTORY of present illness: Patient complains of pain in her right upper back in the rib cage, previously unable to diagnose the cause with radiological studies.  Has tried various treatments without relief including Lidoderm patch. She is only taking tramadol p.r.n. And also an over-the-counter topical preparation Without much relief . She will sometimes take up to 3 tramadol at the time  Hypothyroidism:  She has been hypothyroid since 3/10 with initial complaints of tiredness and sleepiness. She has required gradually increasing doses.  She is compliant with her Synthroid and not taking iron at the same time.  Does not feel unusually fatigued. The last TSH was performed 5/14 with the result 2.8 and normal again now  Anemia:  Anemia Her last hemoglobin is now 10.7 which is improved. Her hemoglobin earlier was 9.4 in 5/14 .However she still has a relatively low iron saturation with Folivane plus (? Integra generic)  Previously Had GI bleeding from Coumadin and required transfusion in the hospital. Hemoglobin at discharge was 9.2 . She does not feel unusually short of breath or fatigue.  Has been regular with taking her iron .  RENAL insufficiency: etiology unclear. Maybe prerenal or related to nephrosclerosis. Creatinine has been higher since she was treated with Lasix for congestive heart failure. Has required relatively large doses of Lasix to control her CHF previously and is being treated by her cardiologist. The creatinine now 2.0, previously was 1.7 without change in medications  ATRIAL fibrillation: Back in sinus rhythm, last EKG showed sinus rhythm..She is not on Coumadin. She is also on diltiazem for hypertension and heart rate control.   Dyspnea:  not present now. Still on oxygen. She was admitted for shortness of breath this year and apparently had another episode of pleural  effusion treated by thoracentesis.  since then has been on Lasix 40 mg twice a day  She was evaluated by pulmonologist and has a high ANA titer but no other manifestations of lupus  History of Present Illness:    Medication List       This list is accurate as of: 12/12/12 10:38 AM.  Always use your most recent med list.               beta carotene w/minerals tablet  Take 1 tablet by mouth 2 (two) times daily.     calcium carbonate 1250 MG capsule  Take 1,250 mg by mouth 2 (two) times daily with a meal.     CARDIZEM CD 180 MG 24 hr capsule  Generic drug:  diltiazem  Take 180 mg by mouth daily.     cholecalciferol 1000 UNITS tablet  Commonly known as:  VITAMIN D  Take 1,000 Units by mouth every morning.     desoximetasone 0.05 % cream  Commonly known as:  TOPICORT  Apply topically 2 (two) times daily.     folic acid 1 MG tablet  Commonly known as:  FOLVITE  Take 1 mg by mouth at bedtime.     FOLIVANE-PLUS Caps  Take by mouth.     furosemide 40 MG tablet  Commonly known as:  LASIX  Take 1 tablet (40 mg total) by mouth 2 (two) times daily.     glucosamine-chondroitin 500-400 MG tablet  Take 1 tablet by mouth 2 (two) times daily.     Iron 325 (65 FE) MG Tabs  Take 1  tablet by mouth at bedtime.     levothyroxine 150 MCG tablet  Commonly known as:  SYNTHROID, LEVOTHROID  Take 1 tablet (150 mcg total) by mouth daily before breakfast.     multivitamin with minerals Tabs  Take 1 tablet by mouth every morning.     omeprazole 40 MG capsule  Commonly known as:  PRILOSEC  Take 40 mg by mouth every morning.     simvastatin 10 MG tablet  Commonly known as:  ZOCOR  Take 10 mg by mouth at bedtime.     traMADol 50 MG tablet  Commonly known as:  ULTRAM  Take 50 mg by mouth 2 (two) times daily.        Allergies:  Allergies  Allergen Reactions  . Lisinopril Other (See Comments)    pancreatitis    Past Medical History  Diagnosis Date  . Stroke   . Anemia    . Hyperlipidemia   . Vitamin D deficiency   . Hypothyroid   . Pulmonary hypertension   . Chronic respiratory failure with hypoxia   . Blindness of right eye     Macular degeneration  . Macular degeneration   . Hypertensive heart disease 04/16/2012  . CHF (congestive heart failure)   . Hypertension   . UTI (urinary tract infection)   . Arthritis     KNEES    Past Surgical History  Procedure Laterality Date  . Cataract extraction, bilateral      Family History  Problem Relation Age of Onset  . Heart disease Mother   . Heart disease Father   . Heart disease Brother   . Breast cancer Daughter   . Lymphoma Brother     Social History:  reports that she quit smoking about 14 years ago. Her smoking use included Cigarettes. She has a 25 pack-year smoking history. She has never used smokeless tobacco. She reports that she does not drink alcohol or use illicit drugs.  Review of Systems   She has significant visual impairment, had intraocular bleeding previously on Coumadin HYPERCHOLESTEROLEMIA: currently taking only small doses of simvastatin No history of diabetes No history of change in bowel habits She has a good appetite No recent swelling of the feet No numbness in the feet  Appointment on 12/10/2012  Component Date Value Range Status  . WBC 12/10/2012 7.5  4.5 - 10.5 K/uL Final  . RBC 12/10/2012 3.82* 3.87 - 5.11 Mil/uL Final  . Platelets 12/10/2012 305.0  150.0 - 400.0 K/uL Final  . Hemoglobin 12/10/2012 10.7* 12.0 - 15.0 g/dL Final  . HCT 16/02/9603 33.1* 36.0 - 46.0 % Final  . MCV 12/10/2012 86.6  78.0 - 100.0 fl Final  . MCHC 12/10/2012 32.3  30.0 - 36.0 g/dL Final  . RDW 54/01/8118 15.7* 11.5 - 14.6 % Final  . T4, Total 12/10/2012 11.7  5.0 - 12.5 ug/dL Final  . TSH 14/78/2956 3.37  0.35 - 5.50 uIU/mL Final  . Sodium 12/10/2012 136  135 - 145 mEq/L Final  . Potassium 12/10/2012 4.6  3.5 - 5.1 mEq/L Final  . Chloride 12/10/2012 93* 96 - 112 mEq/L Final  . CO2  12/10/2012 32  19 - 32 mEq/L Final  . Glucose, Bld 12/10/2012 92  70 - 99 mg/dL Final  . BUN 21/30/8657 40* 6 - 23 mg/dL Final  . Creatinine, Ser 12/10/2012 2.0* 0.4 - 1.2 mg/dL Final  . Total Bilirubin 12/10/2012 0.4  0.3 - 1.2 mg/dL Final  . Alkaline Phosphatase 12/10/2012 97  39 - 117 U/L Final  . AST 12/10/2012 28  0 - 37 U/L Final  . ALT 12/10/2012 15  0 - 35 U/L Final  . Total Protein 12/10/2012 8.0  6.0 - 8.3 g/dL Final  . Albumin 46/96/2952 3.8  3.5 - 5.2 g/dL Final  . Calcium 84/13/2440 9.9  8.4 - 10.5 mg/dL Final  . GFR 03/11/2535 25.71* >60.00 mL/min Final  . Cholesterol 12/10/2012 216* 0 - 200 mg/dL Final   ATP III Classification       Desirable:  < 200 mg/dL               Borderline High:  200 - 239 mg/dL          High:  > = 644 mg/dL  . Triglycerides 12/10/2012 50.0  0.0 - 149.0 mg/dL Final   Normal:  <034 mg/dLBorderline High:  150 - 199 mg/dL  . HDL 12/10/2012 100.20  >39.00 mg/dL Final  . VLDL 74/25/9563 10.0  0.0 - 40.0 mg/dL Final  . Total CHOL/HDL Ratio 12/10/2012 2   Final                  Men          Women1/2 Average Risk     3.4          3.3Average Risk          5.0          4.42X Average Risk          9.6          7.13X Average Risk          15.0          11.0                      . Direct LDL 12/10/2012 94.1   Final   Optimal:  <100 mg/dLNear or Above Optimal:  100-129 mg/dLBorderline High:  130-159 mg/dLHigh:  160-189 mg/dLVery High:  >190 mg/dL    EXAM:  BP 875/64  Pulse 72  Resp 12  Ht 5' 4.5" (1.638 m)  Wt 154 lb 9.6 oz (70.126 kg)  BMI 26.14 kg/m2  SpO2 97%  She is alert and oriented No tenderness on palpation of her rib cage history but she has a deformity of the rib cage lateral to the spine at about the T6-T7 level She has vesicular breath sound with coarse crepitations on the right side,no wheezing Breath sounds are present at both bases Heart sounds normal No pedal edema  Assessment/Plan:    Rib cage pain: This is chronic and previous  radiological studies have been negative and she has not had any relief with various modalities of treatment. Has been to an orthopedic surgeon also for this. Will re- x-ray her with series because of slight prominence of the rib cage in that area  Hypothyroidism: Adequately controlled  Hyperlipidemia: LDL below 100 but consider increasing simvastatin to 20 mg  CHRONIC renal dysfunction: Creatinine has been fluctuating within 1.7-2.0 range and would consider reducing Lasix if creatinine higher  Anemia: normocytic normochromic but previously has had low iron saturation. Slightly better and she will continue prescription iron; to consider Procrit if significantly lower  Atrial fibrillation, resolved  Tommey Barret 12/12/2012, 10:38 AM

## 2012-12-15 NOTE — Progress Notes (Signed)
Quick Note:  Please let patient know that the x-ray result is normal and no further action needed ______

## 2012-12-16 ENCOUNTER — Telehealth: Payer: Self-pay | Admitting: *Deleted

## 2012-12-16 NOTE — Telephone Encounter (Signed)
Pt is aware of results. 

## 2012-12-16 NOTE — Telephone Encounter (Signed)
Message copied by Hermenia Bers on Mon Dec 16, 2012 12:58 PM ------      Message from: [name]      Created: Sun Dec 15, 2012  6:07 PM       Please let patient know that the x-ray result is normal and no further action needed ------

## 2013-01-29 ENCOUNTER — Other Ambulatory Visit: Payer: Self-pay

## 2013-01-29 DIAGNOSIS — Z1231 Encounter for screening mammogram for malignant neoplasm of breast: Secondary | ICD-10-CM

## 2013-02-03 ENCOUNTER — Other Ambulatory Visit: Payer: Self-pay | Admitting: Endocrinology

## 2013-02-04 ENCOUNTER — Other Ambulatory Visit: Payer: Self-pay | Admitting: *Deleted

## 2013-02-04 MED ORDER — ZOLPIDEM TARTRATE 5 MG PO TABS: 5.0000 mg | ORAL_TABLET | Freq: Every evening | ORAL | Status: DC | PRN

## 2013-02-06 ENCOUNTER — Ambulatory Visit
Admission: RE | Admit: 2013-02-06 | Discharge: 2013-02-06 | Disposition: A | Discharge status: Home / Self Care | Payer: Medicare Other | Source: Ambulatory Visit

## 2013-02-06 DIAGNOSIS — Z1231 Encounter for screening mammogram for malignant neoplasm of breast: Secondary | ICD-10-CM

## 2013-02-07 ENCOUNTER — Other Ambulatory Visit (INDEPENDENT_AMBULATORY_CARE_PROVIDER_SITE_OTHER): Payer: Medicare Other

## 2013-02-07 DIAGNOSIS — D649 Anemia, unspecified: Secondary | ICD-10-CM

## 2013-02-07 DIAGNOSIS — E039 Hypothyroidism, unspecified: Secondary | ICD-10-CM

## 2013-02-07 DIAGNOSIS — N183 Chronic kidney disease, stage 3 unspecified: Secondary | ICD-10-CM

## 2013-02-07 LAB — CBC WITH DIFFERENTIAL/PLATELET
Basophils Absolute: 0.1 10*3/uL (ref 0.0–0.1)
Basophils Relative: 0.9 % (ref 0.0–3.0)
Eosinophils Absolute: 0.3 10*3/uL (ref 0.0–0.7)
Eosinophils Relative: 4.6 % (ref 0.0–5.0)
HCT: 28.3 % — ABNORMAL LOW (ref 36.0–46.0)
Hemoglobin: 9.3 g/dL — ABNORMAL LOW (ref 12.0–15.0)
Lymphs Abs: 0.9 10*3/uL (ref 0.7–4.0)
MCV: 85.4 fl (ref 78.0–100.0)
Monocytes Absolute: 0.8 10*3/uL (ref 0.1–1.0)
Monocytes Relative: 13 % — ABNORMAL HIGH (ref 3.0–12.0)
Neutro Abs: 4.2 10*3/uL (ref 1.4–7.7)
Platelets: 256 10*3/uL (ref 150.0–400.0)
RDW: 17.1 % — ABNORMAL HIGH (ref 11.5–14.6)
WBC: 6.2 10*3/uL (ref 4.5–10.5)

## 2013-02-07 LAB — COMPREHENSIVE METABOLIC PANEL
Albumin: 3.6 g/dL (ref 3.5–5.2)
BUN: 49 mg/dL — ABNORMAL HIGH (ref 6–23)
CO2: 33 mEq/L — ABNORMAL HIGH (ref 19–32)
GFR: 29.88 mL/min — ABNORMAL LOW (ref >60.00)
Glucose, Bld: 96 mg/dL (ref 70–99)
Potassium: 4 mEq/L (ref 3.5–5.1)
Sodium: 137 mEq/L (ref 135–145)
Total Protein: 7.7 g/dL (ref 6.0–8.3)

## 2013-02-07 LAB — TSH: TSH: 1.59 u[IU]/mL (ref 0.35–5.50)

## 2013-02-07 LAB — T4, FREE: Free T4: 1.18 ng/dL (ref 0.60–1.60)

## 2013-02-08 LAB — VITAMIN D 25 HYDROXY (VIT D DEFICIENCY, FRACTURES): Vit D, 25-Hydroxy: 69 ng/mL (ref 30–89)

## 2013-02-11 ENCOUNTER — Encounter: Payer: Self-pay | Admitting: Endocrinology

## 2013-02-11 ENCOUNTER — Ambulatory Visit (INDEPENDENT_AMBULATORY_CARE_PROVIDER_SITE_OTHER): Payer: Medicare Other | Admitting: Endocrinology

## 2013-02-11 VITALS — BP 122/60 | HR 72 | Temp 98.1°F | Resp 12 | Ht 65.0 in | Wt 164.8 lb

## 2013-02-11 DIAGNOSIS — I119 Hypertensive heart disease without heart failure: Secondary | ICD-10-CM

## 2013-02-11 DIAGNOSIS — E039 Hypothyroidism, unspecified: Secondary | ICD-10-CM

## 2013-02-11 DIAGNOSIS — E785 Hyperlipidemia, unspecified: Secondary | ICD-10-CM

## 2013-02-11 DIAGNOSIS — D649 Anemia, unspecified: Secondary | ICD-10-CM

## 2013-02-11 DIAGNOSIS — N183 Chronic kidney disease, stage 3 unspecified: Secondary | ICD-10-CM

## 2013-02-11 NOTE — Progress Notes (Signed)
Patient ID: [name], female   DOB: March 08, 1925, 79 y.o.   MRN: [id number]  Chief complaint: Followup of various issues   HISTORY of present illness:   Anemia:   She has had anemia for quite some time now and this is partly related to iron deficiency and also partly chronic disease  Her last hemoglobin was 10.7 but is now 9.3. Marland Kitchen She has been quite compliant with her  Maylon Cos plus (? Integra generic)  Previously Had GI bleeding from Coumadin and required transfusion in the hospital.She does not feel unusually short of breath or fatigue.  However has had renal insufficiency also  PAIN in upper back: Still complains of pain in her right upper back in the rib cage, this is of unclear etiology and recent chest x-ray did not show any bony changes. Pain is intermittent and somewhat worse when she is trying to do her dishes or standing up   Has tried various treatments without relief including Lidoderm patch. She is only taking tramadol p.r.n. Marland Kitchen She will sometimes take up to 2 tramadol at the time  Hypothyroidism:  She has been hypothyroid since 3/10 with initial complaints of tiredness and sleepiness. She Appears to have reached a plateau on her dosage this year.  She is compliant with her Synthroid and not taking iron at the same time.  Does not feel unusually fatigued. The TSH is again normal.  RENAL insufficiency: etiology unclear. Maybe prerenal or related to nephrosclerosis. Creatinine has been higher since she was treated with Lasix for congestive heart failure. Has  continued to take 80 mg of Lasix to control her CHF and is being treated by her cardiologist. The creatinine now 1.7, previously 2.0  without change in medications  ATRIAL fibrillation: Back in sinus rhythm, last EKG showed sinus rhythm..She is not on Coumadin. She is also on diltiazem for hypertension and heart rate control.   Dyspnea:  not present now. Still on oxygen. She was admitted for shortness of breathearlier  this  year and had another episode of pleural effusion treated by thoracentesis. Recent chest x-ray shows no change in the effusion. She was evaluated by pulmonologist and has a high ANA titer but no other manifestations of lupus  History of Present Illness:    Medication List       This list is accurate as of: 02/11/13 10:53 AM.  Always use your most recent med list.               baclofen 10 MG tablet  Commonly known as:  LIORESAL  Take 1 tablet (10 mg total) by mouth as needed.     beta carotene w/minerals tablet  Take 1 tablet by mouth 2 (two) times daily.     calcium carbonate 1250 MG capsule  Take 1,250 mg by mouth 2 (two) times daily with a meal.     CARDIZEM CD 180 MG 24 hr capsule  Generic drug:  diltiazem  Take 180 mg by mouth daily.     cholecalciferol 1000 UNITS tablet  Commonly known as:  VITAMIN D  Take 1,000 Units by mouth every morning.     desoximetasone 0.05 % cream  Commonly known as:  TOPICORT  Apply topically 2 (two) times daily.     folic acid 1 MG tablet  Commonly known as:  FOLVITE  Take 1 mg by mouth at bedtime.     FOLIVANE-PLUS Caps  Take by mouth.     furosemide 40 MG tablet  Commonly  known as:  LASIX  Take 1 tablet (40 mg total) by mouth 2 (two) times daily.     glucosamine-chondroitin 500-400 MG tablet  Take 1 tablet by mouth 2 (two) times daily.     Iron 325 (65 FE) MG Tabs  Take 1 tablet by mouth at bedtime.     levothyroxine 150 MCG tablet  Commonly known as:  SYNTHROID, LEVOTHROID  Take 1 tablet (150 mcg total) by mouth daily before breakfast.     multivitamin with minerals Tabs tablet  Take 1 tablet by mouth every morning.     omeprazole 40 MG capsule  Commonly known as:  PRILOSEC  Take 40 mg by mouth every morning.     simvastatin 10 MG tablet  Commonly known as:  ZOCOR  Take 10 mg by mouth at bedtime.     traMADol 50 MG tablet  Commonly known as:  ULTRAM  Take 50 mg by mouth 2 (two) times daily.     zolpidem 5 MG  tablet  Commonly known as:  AMBIEN  Take 1 tablet (5 mg total) by mouth at bedtime as needed for sleep.        Allergies:  Allergies  Allergen Reactions  . Lisinopril Other (See Comments)    pancreatitis    Past Medical History  Diagnosis Date  . Stroke   . Anemia   . Hyperlipidemia   . Vitamin D deficiency   . Hypothyroid   . Pulmonary hypertension   . Chronic respiratory failure with hypoxia   . Blindness of right eye     Macular degeneration  . Macular degeneration   . Hypertensive heart disease 04/16/2012  . CHF (congestive heart failure)   . Hypertension   . UTI (urinary tract infection)   . Arthritis     KNEES    Past Surgical History  Procedure Laterality Date  . Cataract extraction, bilateral      Family History  Problem Relation Age of Onset  . Heart disease Mother   . Heart disease Father   . Heart disease Brother   . Breast cancer Daughter   . Lymphoma Brother     Social History:  reports that she quit smoking about 14 years ago. Her smoking use included Cigarettes. She has a 25 pack-year smoking history. She has never used smokeless tobacco. She reports that she does not drink alcohol or use illicit drugs.  Review of Systems   She has significant visual impairment, had intraocular bleeding previously on Coumadin HYPERCHOLESTEROLEMIA: currently taking only small doses of simvastatin No history of diabetes No history of change in bowel habits She has a good appetite No recent swelling of the feet No numbness in the feet  Appointment on 02/07/2013  Component Date Value Range Status  . Sodium 02/07/2013 137  135 - 145 mEq/L Final  . Potassium 02/07/2013 4.0  3.5 - 5.1 mEq/L Final  . Chloride 02/07/2013 96  96 - 112 mEq/L Final  . CO2 02/07/2013 33* 19 - 32 mEq/L Final  . Glucose, Bld 02/07/2013 96  70 - 99 mg/dL Final  . BUN 16/02/9603 49* 6 - 23 mg/dL Final  . Creatinine, Ser 02/07/2013 1.7* 0.4 - 1.2 mg/dL Final  . Total Bilirubin  02/07/2013 0.5  0.3 - 1.2 mg/dL Final  . Alkaline Phosphatase 02/07/2013 83  39 - 117 U/L Final  . AST 02/07/2013 26  0 - 37 U/L Final  . ALT 02/07/2013 11  0 - 35 U/L Final  .  Total Protein 02/07/2013 7.7  6.0 - 8.3 g/dL Final  . Albumin 19/14/7829 3.6  3.5 - 5.2 g/dL Final  . Calcium 56/21/3086 9.5  8.4 - 10.5 mg/dL Final  . GFR 57/84/6962 29.88* >60.00 mL/min Final  . Free T4 02/07/2013 1.18  0.60 - 1.60 ng/dL Final  . TSH 95/28/4132 1.59  0.35 - 5.50 uIU/mL Final  . WBC 02/07/2013 6.2  4.5 - 10.5 K/uL Final  . RBC 02/07/2013 3.32* 3.87 - 5.11 Mil/uL Final  . Hemoglobin 02/07/2013 9.3* 12.0 - 15.0 g/dL Final  . HCT 44/05/270 28.3* 36.0 - 46.0 % Final  . MCV 02/07/2013 85.4  78.0 - 100.0 fl Final  . MCHC 02/07/2013 33.0  30.0 - 36.0 g/dL Final  . RDW 53/66/4403 17.1* 11.5 - 14.6 % Final  . Platelets 02/07/2013 256.0  150.0 - 400.0 K/uL Final  . Neutrophils Relative % 02/07/2013 67.3  43.0 - 77.0 % Final  . Lymphocytes Relative 02/07/2013 14.2  12.0 - 46.0 % Final  . Monocytes Relative 02/07/2013 13.0* 3.0 - 12.0 % Final  . Eosinophils Relative 02/07/2013 4.6  0.0 - 5.0 % Final  . Basophils Relative 02/07/2013 0.9  0.0 - 3.0 % Final  . Neutro Abs 02/07/2013 4.2  1.4 - 7.7 K/uL Final  . Lymphs Abs 02/07/2013 0.9  0.7 - 4.0 K/uL Final  . Monocytes Absolute 02/07/2013 0.8  0.1 - 1.0 K/uL Final  . Eosinophils Absolute 02/07/2013 0.3  0.0 - 0.7 K/uL Final  . Basophils Absolute 02/07/2013 0.1  0.0 - 0.1 K/uL Final  . Vit D, 25-Hydroxy 02/07/2013 69  30 - 89 ng/mL Final   Comment: This assay accurately quantifies Vitamin D, which is the sum of the                          25-Hydroxy forms of Vitamin D2 and D3.  Studies have shown that the                          optimum concentration of 25-Hydroxy Vitamin D is 30 ng/mL or higher.                           Concentrations of Vitamin D between 20 and 29 ng/mL are considered to                          be insufficient and concentrations less  than 20 ng/mL are considered                          to be deficient for Vitamin D.    EXAM:  BP 122/60  Pulse 72  Temp(Src) 98.1 F (36.7 C)  Resp 12  Ht 5\' 5"  (1.651 m)  Wt 164 lb 12.8 oz (74.753 kg)  BMI 27.42 kg/m2  SpO2 98%  She is alert and oriented No tenderness on palpation of her rib cage history but she has a  mild deformity of the rib cage lateral to the spine at about the T6-T7 level She has vesicular breath sound with  a few coarse crepitations on the left base ,no wheezing Breath sounds are present but reduced at the right base along with some dullness  Heart sounds normal No pedal edema  Assessment/Plan:    Rib cage pain: This is chronic and previous  radiological studies have been negative and she has not had any relief with various modalities of treatment. She can continue to take tramadol as needed since the pain is intermittent.   Hypothyroidism: Adequately controlled  Hyperlipidemia: LDL below 100 on the last visit   CHRONIC renal dysfunction: Creatinine has been fluctuating within 1.7-2.0 range and relatively better now   Anemia: normocytic normochromic but previously has had low iron saturation. Slightly worse and need to consider Procrit. We'll refer to hematologist  Atrial fibrillation, resolved  Daryana Whirley 02/11/2013, 10:53 AM

## 2013-02-11 NOTE — Patient Instructions (Signed)
Same meds

## 2013-02-12 ENCOUNTER — Telehealth: Payer: Self-pay | Admitting: Oncology

## 2013-02-12 NOTE — Telephone Encounter (Signed)
S/W PT AND GVE NP APPT 10/14 @ 1:30 W/DR. SHADAD REFERRING DR. [name] DX-CKD STAGE 3, GFR 30-59 ML/MIN WELCOME PACKET MAILED.

## 2013-02-17 ENCOUNTER — Telehealth: Payer: Self-pay | Admitting: Oncology

## 2013-02-17 NOTE — Telephone Encounter (Signed)
C/D 02/17/13 for appt. 02/25/13

## 2013-02-21 ENCOUNTER — Other Ambulatory Visit: Payer: Self-pay | Admitting: Oncology

## 2013-02-21 DIAGNOSIS — D631 Anemia in chronic kidney disease: Secondary | ICD-10-CM

## 2013-02-24 ENCOUNTER — Encounter: Payer: Self-pay | Admitting: Internal Medicine

## 2013-02-24 ENCOUNTER — Ambulatory Visit (INDEPENDENT_AMBULATORY_CARE_PROVIDER_SITE_OTHER)
Admission: RE | Admit: 2013-02-24 | Discharge: 2013-02-24 | Disposition: A | Discharge status: Home / Self Care | Payer: Medicare Other | Source: Ambulatory Visit | Attending: Internal Medicine | Admitting: Internal Medicine

## 2013-02-24 ENCOUNTER — Ambulatory Visit (INDEPENDENT_AMBULATORY_CARE_PROVIDER_SITE_OTHER): Payer: Medicare Other | Admitting: Internal Medicine

## 2013-02-24 VITALS — BP 100/58 | HR 65 | Ht 64.5 in | Wt 168.6 lb

## 2013-02-24 DIAGNOSIS — J9610 Chronic respiratory failure, unspecified whether with hypoxia or hypercapnia: Secondary | ICD-10-CM

## 2013-02-24 DIAGNOSIS — J90 Pleural effusion, not elsewhere classified: Secondary | ICD-10-CM

## 2013-02-24 DIAGNOSIS — J9611 Chronic respiratory failure with hypoxia: Secondary | ICD-10-CM

## 2013-02-24 DIAGNOSIS — R0902 Hypoxemia: Secondary | ICD-10-CM

## 2013-02-24 NOTE — Patient Instructions (Signed)
Please do CXR tpoday; will call with results So far no cause for fluid other than sub-clinical liver issue or heart relaxation issue perhaps Will keep track of anemia workup REturn in 4 months or sooner if needed

## 2013-02-24 NOTE — Progress Notes (Signed)
Subjective:    Patient ID: [name], female    DOB: 1925-02-15, 79 y.o.   MRN: [id number]  HPI  OV 11/14/12 79 year old female. Former smoker. Mentally sharp thugh hard of hearing. Presents with daughter. Switiching care from  Dr MW to me (MR) Main concern  Is chronic right pleural efuffusion for years and associted dyspnea and associated hypoxemia on oxygen since dec 2013   Has chronic right pleural effusion since July 2010 per my review of imaging (cxr feb 2010 ws clear).  Transudative effusion per thoracenttesis Dec 2013, Jan 2014 and April 2014 thora with non-diagnostic cytology each time and negative flow cytometry in Jan 2014 Normal liver on [place] Feb 2010 and CT abdomen June 2011 Ana negative - dec 2013 ECHO April 2014  - Mild to Moderate mitral stenosis but normal EF Chronic diastolic CHF due to valvulaer heart dz - per DR Donnie Aho notes May 2014 (BNP 500-700 APril - May 2014) KNown to have chronic renal insuff - creat  1.6gm% And gfr 30s. No hx of proetinuria check Known to have hypothuodisim - April 2014 TSH 11.47 and synthroid increased and subsequently normalized  This results in class 3 dyspnea and also says she desaturates when she stands up. REview of records show she has exertional desaturations that correct with o2    REC Unclear cause of pleural effusion  Conitnue oxygen for now  Maybe thyroid is the problem +/- heart issues is the problem  Do blood test to rule out autoimmune disease  Do 24h urine protein collection to rule out renal cause  IF keeps recurring and we do not find A cause- discussed pleurX catheter and this might be best solution to help shortness of breah  Will call you with test results to decide next step   OV 02/24/2013  Chronic right transudative pleural effusion followup. Presence of daughter [name]  - At last visit further evaluation for transudative pleural effusion was done. 24-hour urine protein collection was normal. Autoimmune tests were  normal. Therefore we are left with diastolic heart failure as etiology of right-sided chronic transudative pleural effusion. Otherwise this is idiopathic. She has not had any for this chest x-ray since May 2014  - Her pulse oximetry today was 88% on room air at rest. Oxygen 2 L nasal cannula helps with dyspnea.  Past medical history reviewed: She has some anemia and she got a see Dr. Venda Clay for the same  Review of Systems  Constitutional: Negative for fever and unexpected weight change.  HENT: Negative for congestion, dental problem, ear pain, nosebleeds, postnasal drip, rhinorrhea, sinus pressure, sneezing, sore throat and trouble swallowing.   Eyes: Negative for redness and itching.  Respiratory: Negative for cough, chest tightness, shortness of breath and wheezing.   Cardiovascular: Negative for palpitations and leg swelling.  Gastrointestinal: Negative for nausea and vomiting.  Genitourinary: Negative for dysuria.  Musculoskeletal: Negative for joint swelling.  Skin: Negative for rash.  Neurological: Negative for headaches.  Hematological: Does not bruise/bleed easily.  Psychiatric/Behavioral: Negative for dysphoric mood. The patient is not nervous/anxious.        Objective:   Physical Exam  Vitals reviewed. Constitutional: She is oriented to person, place, and time. She appears well-developed and well-nourished. No distress.  Frail female on oxygen  HENT:  Head: Normocephalic and atraumatic.  Right Ear: External ear normal.  Left Ear: External ear normal.  Mouth/Throat: Oropharynx is clear and moist. No oropharyngeal exudate.  Eyes: Conjunctivae and EOM are normal.  Pupils are equal, round, and reactive to light. Right eye exhibits no discharge. Left eye exhibits no discharge. No scleral icterus.  Neck: Normal range of motion. Neck supple. No JVD present. No tracheal deviation present. No thyromegaly present.  Cardiovascular: Normal rate, regular rhythm, normal heart sounds and  intact distal pulses.  Exam reveals no gallop and no friction rub.   No murmur heard. Pulmonary/Chest: Effort normal and breath sounds normal. No respiratory distress. She has no wheezes. She has no rales. She exhibits no tenderness.  Abdominal: Soft. Bowel sounds are normal. She exhibits no distension and no mass. There is no tenderness. There is no rebound and no guarding.  Musculoskeletal: Normal range of motion. She exhibits no edema and no tenderness.  Lymphadenopathy:    She has no cervical adenopathy.  Neurological: She is alert and oriented to person, place, and time. She has normal reflexes. No cranial nerve deficit. She exhibits normal muscle tone. Coordination normal.  Skin: Skin is warm and dry. No rash noted. She is not diaphoretic. No erythema. No pallor.  Psychiatric: She has a normal mood and affect. Her behavior is normal. Judgment and thought content normal.          Assessment & Plan:

## 2013-02-25 ENCOUNTER — Telehealth: Payer: Self-pay | Admitting: Oncology

## 2013-02-25 ENCOUNTER — Ambulatory Visit (HOSPITAL_BASED_OUTPATIENT_CLINIC_OR_DEPARTMENT_OTHER): Payer: Medicare Other | Admitting: Oncology

## 2013-02-25 ENCOUNTER — Ambulatory Visit: Payer: Medicare Other

## 2013-02-25 ENCOUNTER — Encounter: Payer: Self-pay | Admitting: Oncology

## 2013-02-25 ENCOUNTER — Other Ambulatory Visit (HOSPITAL_BASED_OUTPATIENT_CLINIC_OR_DEPARTMENT_OTHER): Payer: Medicare Other | Admitting: Lab

## 2013-02-25 VITALS — BP 139/45 | HR 75 | Temp 97.3°F | Resp 20 | Ht 64.5 in | Wt 167.2 lb

## 2013-02-25 DIAGNOSIS — D631 Anemia in chronic kidney disease: Secondary | ICD-10-CM

## 2013-02-25 DIAGNOSIS — N189 Chronic kidney disease, unspecified: Secondary | ICD-10-CM

## 2013-02-25 DIAGNOSIS — D649 Anemia, unspecified: Secondary | ICD-10-CM

## 2013-02-25 LAB — IRON AND TIBC CHCC
Iron: 39 ug/dL — ABNORMAL LOW (ref 41–142)
TIBC: 349 ug/dL (ref 236–444)

## 2013-02-25 LAB — COMPREHENSIVE METABOLIC PANEL (CC13)
ALT: 8 U/L (ref 0–55)
AST: 22 U/L (ref 5–34)
Albumin: 3.2 g/dL — ABNORMAL LOW (ref 3.5–5.0)
Alkaline Phosphatase: 100 U/L (ref 40–150)
BUN: 45.8 mg/dL — ABNORMAL HIGH (ref 7.0–26.0)
Calcium: 9.9 mg/dL (ref 8.4–10.4)
Glucose: 109 mg/dl (ref 70–140)
Potassium: 4.7 mEq/L (ref 3.5–5.1)
Sodium: 139 mEq/L (ref 136–145)
Total Bilirubin: 0.23 mg/dL (ref 0.20–1.20)
Total Protein: 8.2 g/dL (ref 6.4–8.3)

## 2013-02-25 LAB — CBC WITH DIFFERENTIAL/PLATELET
BASO%: 1.4 % (ref 0.0–2.0)
Basophils Absolute: 0.1 10*3/uL (ref 0.0–0.1)
EOS%: 3.4 % (ref 0.0–7.0)
Eosinophils Absolute: 0.2 10*3/uL (ref 0.0–0.5)
HCT: 28.5 % — ABNORMAL LOW (ref 34.8–46.6)
MCH: 28.2 pg (ref 25.1–34.0)
MCHC: 32.5 g/dL (ref 31.5–36.0)
MCV: 86.6 fL (ref 79.5–101.0)
MONO%: 13.5 % (ref 0.0–14.0)
NEUT#: 5.1 10*3/uL (ref 1.5–6.5)
RBC: 3.29 10*6/uL — ABNORMAL LOW (ref 3.70–5.45)
RDW: 16.5 % — ABNORMAL HIGH (ref 11.2–14.5)
WBC: 7.2 10*3/uL (ref 3.9–10.3)

## 2013-02-25 LAB — CHCC SMEAR

## 2013-02-25 NOTE — Consult Note (Signed)
Reason for Referral: Anemia.   HPI: 79 year old woman with past medical history significant for macular degeneration, hypertension and congestive heart failure. She had been diagnosed with chronic anemia with fluctuating hemoglobin since 2009. She was hospitalized in April of 2014 and her hemoglobin was as low as 8 and required blood transfusion. She was possibly diagnosed with GI bleed due to hemorrhoids which now have subsided. She have been on iron supplements in the past intermittently and most recently was switched to a multivitamin. She does not report any recent need for blood transfusions. Has not reported any recent blood loss. She is not reporting any hematochezia or melena. Does not report any genitourinary bleeding. She does report occasional shortness of breath related to her congestive heart failure and pleural effusions related to that. Clinically, she continues to live with her husband and despite her poor vision is able to perform most activities of daily living. She does not drive but able to prepare meals and clean her house regularly. She does not report any chest pain or cough or hemoptysis. She does not report any nausea or vomiting. Does not report any genitourinary complaints. Does that report any neurological symptoms. Does not report any easy bruisability or constitutional symptoms.   Past Medical History  Diagnosis Date  . Stroke   . Anemia   . Hyperlipidemia   . Vitamin D deficiency   . Hypothyroid   . Pulmonary hypertension   . Chronic respiratory failure with hypoxia   . Blindness of right eye     Macular degeneration  . Macular degeneration   . Hypertensive heart disease 04/16/2012  . CHF (congestive heart failure)   . Hypertension   . UTI (urinary tract infection)   . Arthritis     KNEES  :  Past Surgical History  Procedure Laterality Date  . Cataract extraction, bilateral    :   Current Outpatient Prescriptions  Medication Sig Dispense Refill  .  baclofen (LIORESAL) 10 MG tablet Take 1 tablet (10 mg total) by mouth as needed.  30 each  0  . beta carotene w/minerals (OCUVITE) tablet Take 1 tablet by mouth 2 (two) times daily.       . calcium carbonate 1250 MG capsule Take 1,250 mg by mouth 2 (two) times daily with a meal.      . cholecalciferol (VITAMIN D) 1000 UNITS tablet Take 1,000 Units by mouth every morning.       . desoximetasone (TOPICORT) 0.05 % cream Apply topically 2 (two) times daily.      Marland Kitchen diltiazem (CARDIZEM CD) 180 MG 24 hr capsule Take 180 mg by mouth daily.      Marland Kitchen FeFum-FePoly-FA-B Cmp-C-Biot (FOLIVANE-PLUS) CAPS Take by mouth.      . folic acid (FOLVITE) 1 MG tablet Take 1 mg by mouth at bedtime.       . furosemide (LASIX) 40 MG tablet Take 1 tablet (40 mg total) by mouth 2 (two) times daily.  60 tablet  0  . glucosamine-chondroitin 500-400 MG tablet Take 1 tablet by mouth 2 (two) times daily.       Marland Kitchen levothyroxine (SYNTHROID, LEVOTHROID) 150 MCG tablet Take 1 tablet (150 mcg total) by mouth daily before breakfast.  30 tablet  0  . Multiple Vitamin (MULITIVITAMIN WITH MINERALS) TABS Take 1 tablet by mouth every morning.       Marland Kitchen omeprazole (PRILOSEC) 40 MG capsule Take 40 mg by mouth every morning.       . simvastatin (ZOCOR)  10 MG tablet Take 10 mg by mouth at bedtime.      . traMADol (ULTRAM) 50 MG tablet Take 50 mg by mouth 3 (three) times daily.       Marland Kitchen zolpidem (AMBIEN) 5 MG tablet Take 1 tablet (5 mg total) by mouth at bedtime as needed for sleep.  30 tablet  1   No current facility-administered medications for this visit.      Allergies  Allergen Reactions  . Lisinopril Other (See Comments)    pancreatitis  :  Family History  Problem Relation Age of Onset  . Heart disease Mother   . Heart disease Father   . Heart disease Brother   . Breast cancer Daughter   . Lymphoma Brother   :  History   Social History  . Marital Status: Married    Spouse Name: N/A    Number of Children: N/A  . Years of  Education: N/A   Occupational History  . Not on file.   Social History Main Topics  . Smoking status: Former Smoker -- 0.50 packs/day for 50 years    Types: Cigarettes    Quit date: 05/15/1998  . Smokeless tobacco: Never Used  . Alcohol Use: No  . Drug Use: No  . Sexual Activity: No   Other Topics Concern  . Not on file   Social History Narrative  . No narrative on file  :  Pertinent items are noted in HPI.  Exam: ECOG 1  Blood pressure 139/45, pulse 75, temperature 97.3 F (36.3 C), temperature source Oral, resp. rate 20, height 5' 4.5" (1.638 m), weight 167 lb 3.2 oz (75.841 kg). General appearance: alert, cooperative and appears stated age Head: Normocephalic, without obvious abnormality, atraumatic Throat: lips, mucosa, and tongue normal; teeth and gums normal Neck: no adenopathy, no carotid bruit, no JVD, supple, symmetrical, trachea midline and thyroid not enlarged, symmetric, no tenderness/mass/nodules Resp: rales bilaterally Chest wall: no tenderness Cardio: regular rate and rhythm, S1, S2 normal, no murmur, click, rub or gallop GI: soft, non-tender; bowel sounds normal; no masses,  no organomegaly Extremities: extremities normal, atraumatic, no cyanosis or edema Pulses: 2+ and symmetric Lymph nodes: Cervical, supraclavicular, and axillary nodes normal.   Recent Labs  02/25/13 1349  WBC 7.2  HGB 9.3*  HCT 28.5*  PLT 290      Blood smear review: I. personally reviewed her smear and showed a red cells to be microcytic and slightly hypochromic. There is no red cell fragmentation or schistocytes. The white cells and platelets appeared normal.     Assessment and Plan:  79 year old woman with the following issues:  1. Multifactorial anemia: The differential diagnosis was discussed today with the patient and her daughter that accompanied her today. She probably has an element of anemia of chronic disease, anemia of renal disease given the fact that her  creatinine is around 1.7 with a GFR of less than 30 cc per minute. She could also have a an element of anemia of iron deficiency due to chronic GI blood losses. Other conditions such as myelodysplastic syndrome, multiple myeloma and less likely leukemia are also considerations. To work this up completely, I will repeat iron studies as well as B12 and folic acid as well as a serum protein electrophoresis. From a management standpoint, I do agree with the multivitamin supplementation and I have discussed with her the role of Procrit supplements if her erythropoietin level is indeed low. Risks and benefits of this approach was discussed she  is agreeable to proceed with that in the future if needed. He is asymptomatic at this point and will like to defer that if her hemoglobin drops further or she becomes symptomatic. For the time being, I will repeat your blood count in about 3 months will have another discussion at that time.  2. Chronic renal insufficiency:likely related to chronic diuresis among other factors but we will evaluate her for a plasma cell disorder for completeness.

## 2013-02-25 NOTE — Progress Notes (Signed)
Please consult note.

## 2013-02-25 NOTE — Telephone Encounter (Signed)
gv adn printed appt sched and avs for pt for Jan 2015 °

## 2013-02-25 NOTE — Progress Notes (Signed)
Checked in new pt with no financial concerns. °

## 2013-02-27 LAB — SPEP & IFE WITH QIG
Alpha-1-Globulin: 7.5 % — ABNORMAL HIGH (ref 2.9–4.9)
Alpha-2-Globulin: 14.1 % — ABNORMAL HIGH (ref 7.1–11.8)
Beta 2: 4 % (ref 3.2–6.5)
IgA: <6 mg/dL — ABNORMAL LOW (ref 69–380)
IgG (Immunoglobin G), Serum: 1650 mg/dL (ref 690–1700)
IgM, Serum: 64 mg/dL (ref 52–322)
Total Protein, Serum Electrophoresis: 7.4 g/dL (ref 6.0–8.3)

## 2013-02-27 LAB — ERYTHROPOIETIN: Erythropoietin: 16.3 m[IU]/mL (ref 2.6–18.5)

## 2013-03-02 NOTE — Assessment & Plan Note (Signed)
Please do CXR tpoday; will call with results So far no cause for fluid other than sub-clinical liver issue or heart relaxation issue perhaps Will keep track of anemia workup REturn in 4 months or sooner if needed  

## 2013-03-05 NOTE — Progress Notes (Signed)
Quick Note:  Advised pt of cxr per MR. Pt and pt's daughter verbalized understanding and will keep f/u appt ______

## 2013-03-10 ENCOUNTER — Other Ambulatory Visit: Payer: Self-pay | Admitting: Physician Assistant

## 2013-04-03 ENCOUNTER — Other Ambulatory Visit: Payer: Self-pay | Admitting: *Deleted

## 2013-04-03 ENCOUNTER — Other Ambulatory Visit: Payer: Self-pay | Admitting: Endocrinology

## 2013-04-03 ENCOUNTER — Encounter: Payer: Self-pay | Admitting: Cardiology

## 2013-04-03 NOTE — Progress Notes (Signed)
Patient ID: CARALINE DEUTSCHMAN, female   DOB: [birth year], 79 y.o.   MRN: [id number]   [place], [place]    Date of visit:  04/03/2013 DOB:  10/30/1924    Age:  79 yrs. Medical record number:  [id number] Account number:  [id number] Primary Care Provider: [name] ____________________________ CURRENT DIAGNOSES  1. Congestive Heart Failure Chronic Diastolic  2. Arrhythmia-Atrial Fibrillation  3. Hypertensive Heart Disease-Benign without CHF  4. Mitral Valve Disorder  5. Hyperlipidemia  6. Hypothyroidism  7. Personal history of TIA or stroke without residua  8. Pleural effusion ____________________________ ALLERGIES  Lisinopril, Pancreatitis ____________________________ MEDICATIONS  1. Multi-Vitamin Tab, 1 p.o. daily  2. folic acid 400 mcg Tablet, 1 p.o. daily  3. omeprazole 40 mg capsule,delayed release(DR/EC), 1 p.o. daily  4. Vitamin D3 1,000 unit tablet, 1 p.o. daily  5. Glucosamine-Chond-MSM Complex 375-500-15-0.5 mg tablet, BID  6. Ocuvite 150-30-6-150 mg-unit-mg-mg capsule, BID  7. Calcium 600 + D(3) 600-125 mg-unit tablet, 1 p.o. daily  8. Cardizem CD 180 mg capsule,extended release 24hr, 1 p.o. daily  9. simvastatin 10 mg tablet, 1 p.o. daily  10. levothyroxine 150 mcg tablet, 1 p.o. daily  11. furosemide 40 mg tablet, BID  12. tramadol 50 mg tablet, BID to tid  13. Tylenol Extra Strength 500 mg tablet, PRN  14. Folivane-Plus 125-1 mg capsule, 1 p.o. daily ____________________________ CHIEF COMPLAINTS  Followup of Arrhythmia-Atrial Fibrillation ____________________________ HISTORY OF PRESENT ILLNESS  Patient seen for cardiac followup. She has had a good 3 months since she was here. She has seen the hematologist and is mildly anemic still. She has had no recurrence of the severe anemia and clinically remains in sinus rhythm. She denies angina and has no PND or orthopnea. She has mild edema involving her left leg. The pleural effusion has been under reasonable control. Diabetes has  been under good control and she does have some mild chronic kidney disease. She continues to wear oxygen for significant desaturations but has been able to stay out of the hospital. ____________________________ PAST HISTORY  Past Medical Illnesses:  hypertension, hyperlipidemia, hypothyroidism, obesity, CVA without deficits, chronic anemia, macular degeneration;  Cardiovascular Illnesses:  diastolic CHF, atrial fibrillation-paroxysmal;  Surgical Procedures:  no prior surgical procedures;  Cardiology Procedures-Invasive:  no previous interventional or invasive cardiology procedures;  Cardiology Procedures-Noninvasive:  echocardiogram December 2013;  LVEF of 60% documented via echocardiogram on 09/10/2012,   ____________________________ CARDIO-PULMONARY TEST DATES EKG Date:  07/02/2012;  Echocardiography Date: 09/10/2012;  Chest Xray Date: 09/15/2012;   ____________________________ FAMILY HISTORY Brother -- Coronary Artery Disease, Brother dead Brother -- Coronary Artery Disease, Brother dead Father -- Coronary Artery Disease, Father dead Mother -- Coronary Artery Disease, Mother dead Sister -- Unknown Disease, Sister dead ____________________________ SOCIAL HISTORY Alcohol Use:  does not use alcohol;  Smoking:  nonsmoker;  Diet:  regular diet;  Lifestyle:  married and 3 daughters;  Exercise:  exercise is limited due to physical disability;  Residence:  lives with daughter;   ____________________________ REVIEW OF SYSTEMS General:  malaise and fatigue  Integumentary:easy bruisability Eyes: decreased acuity, vision loss OD, macular degeneration Ears, Nose, Throat, Mouth:  partial hearing loss Respiratory: mild dyspnea with exertion Cardiovascular:  please review HPI Genitourinary-Female: frequency, nocturia, recurrent urinary tract infections Musculoskeletal:  arthritis of the knees Neurological:  gait disturbance  ____________________________ PHYSICAL EXAMINATION VITAL SIGNS  Blood Pressure:   140/60 Sitting, Left arm, regular cuff  , 140/64 Standing, Left arm and regular cuff   Pulse:  84/min. Weight:  170.00  lbs. Height:  64"BMI: 29  Constitutional:  pleasant white female, in no acute distress wearing oxygen Head:  normocephalic, normal hair pattern, no masses or tenderness Eyes:  right eye cloudy, funduscopic exam not performed Neck:  supple, without massess. No JVD, thyromegaly or carotid bruits. Carotid upstroke normal. Chest:  reduced breath sounds in right base, coarse breath sounds and mild rub heard on right Cardiac:  regular rhythm, normal S1 and S2, no S3 or S4, grade 3/6 systolic murmur Peripheral Pulses:  1+ pedal pulses Extremities & Back:  trace edema Neurological:  no gross motor or sensory deficits noted, affect appropriate, oriented x3. ____________________________ MOST RECENT LIPID PANEL 12/10/12  CHOL TOTL 216 mg/dl, LDL 16.1 calc, HDL 096.0 mg/dl, TRIGLYCER 50 mg/dl and CHOL/HDL 2 (Calc) ____________________________ IMPRESSIONS/PLAN  1. Chronic diastolic heart failure 2. Prior history of atrial fibrillation maintaining sinus rhythm clinically 3. Stage 3-4 chronic kidney disease 5. Valvular heart disease with mitral regurgitation  Recommendations:  Medically the patient is doing reasonably well at this time and is now living at home able to do some light housework and is relatively active. I will plan to see her in followup in 6 months at this time but they should call if she develops any issues. In particular it is very important that her diuretic dose not be reduced to avoid recurrence of congestive heart failure. ____________________________ TODAYS ORDERS  1. Return Visit: 6 months  2. 12 Lead EKG: 6 months                       ____________________________ Cardiology Physician:  Darden Palmer MD [facility]

## 2013-04-04 ENCOUNTER — Other Ambulatory Visit: Payer: Self-pay | Admitting: *Deleted

## 2013-04-30 ENCOUNTER — Other Ambulatory Visit: Payer: Self-pay | Admitting: Endocrinology

## 2013-05-01 ENCOUNTER — Telehealth: Payer: Self-pay | Admitting: Oncology

## 2013-05-01 ENCOUNTER — Other Ambulatory Visit (INDEPENDENT_AMBULATORY_CARE_PROVIDER_SITE_OTHER): Payer: Medicare Other

## 2013-05-01 DIAGNOSIS — E785 Hyperlipidemia, unspecified: Secondary | ICD-10-CM

## 2013-05-01 LAB — COMPREHENSIVE METABOLIC PANEL
Alkaline Phosphatase: 88 U/L (ref 39–117)
BUN: 43 mg/dL — ABNORMAL HIGH (ref 6–23)
Calcium: 9.3 mg/dL (ref 8.4–10.5)
Chloride: 95 mEq/L — ABNORMAL LOW (ref 96–112)
Creatinine, Ser: 1.7 mg/dL — ABNORMAL HIGH (ref 0.4–1.2)
Glucose, Bld: 115 mg/dL — ABNORMAL HIGH (ref 70–99)
Sodium: 137 mEq/L (ref 135–145)
Total Bilirubin: 0.3 mg/dL (ref 0.3–1.2)
Total Protein: 7.6 g/dL (ref 6.0–8.3)

## 2013-05-01 LAB — LIPID PANEL
Cholesterol: 186 mg/dL (ref 0–200)
LDL Cholesterol: 93 mg/dL (ref 0–99)
Triglycerides: 72 mg/dL (ref 0.0–149.0)

## 2013-05-01 NOTE — Telephone Encounter (Signed)
called and s/w husband..he wanted me to call back....mailed pt avs..appt sched and letter

## 2013-05-05 ENCOUNTER — Ambulatory Visit: Payer: Medicare Other | Admitting: Endocrinology

## 2013-05-07 ENCOUNTER — Ambulatory Visit: Payer: Medicare Other | Admitting: Endocrinology

## 2013-05-16 ENCOUNTER — Ambulatory Visit: Payer: Medicare Other | Admitting: Endocrinology

## 2013-05-19 ENCOUNTER — Other Ambulatory Visit: Payer: Self-pay | Admitting: Endocrinology

## 2013-05-29 ENCOUNTER — Ambulatory Visit (INDEPENDENT_AMBULATORY_CARE_PROVIDER_SITE_OTHER): Payer: Medicare Other | Admitting: Endocrinology

## 2013-05-29 ENCOUNTER — Encounter: Payer: Self-pay | Admitting: Endocrinology

## 2013-05-29 VITALS — BP 118/60 | HR 69 | Temp 98.3°F | Resp 12 | Ht 65.0 in | Wt 167.1 lb

## 2013-05-29 DIAGNOSIS — R0609 Other forms of dyspnea: Secondary | ICD-10-CM

## 2013-05-29 DIAGNOSIS — N183 Chronic kidney disease, stage 3 unspecified: Secondary | ICD-10-CM

## 2013-05-29 DIAGNOSIS — R071 Chest pain on breathing: Secondary | ICD-10-CM

## 2013-05-29 DIAGNOSIS — E039 Hypothyroidism, unspecified: Secondary | ICD-10-CM

## 2013-05-29 DIAGNOSIS — R0600 Dyspnea, unspecified: Secondary | ICD-10-CM

## 2013-05-29 DIAGNOSIS — R0789 Other chest pain: Secondary | ICD-10-CM

## 2013-05-29 DIAGNOSIS — R0989 Other specified symptoms and signs involving the circulatory and respiratory systems: Secondary | ICD-10-CM

## 2013-05-29 MED ORDER — METAXALONE 800 MG PO TABS: ORAL_TABLET | ORAL | Status: DC

## 2013-05-29 NOTE — Progress Notes (Signed)
Patient ID: [name], female   DOB: Sep 18, 1924, 80 y.o.   MRN: [id number]  Chief complaint: Followup of various issues  HISTORY of present illness:   RENAL insufficiency: etiology unclear. Maybe related to nephrosclerosis. Creatinine has been higher since she was treated with Lasix for congestive heart failure. Has  continued to take 80 mg of Lasix to control her CHF and is being treated by her cardiologist.  The creatinine is now 1.7, previously 2.0  without change in medications  Anemia:   She has had anemia for quite some time now and this is partly related to iron deficiency and also partly chronic disease  Her last hemoglobin was 9.3. Marland Kitchen She has been quite compliant with her  Maylon Cos plus (? Integra generic)  Previously Had GI bleeding from Coumadin and required transfusion in the hospital. She does not feel unusually short of breath or fatigue. No further changes were made after hematology consultation, Procrit injection deferred because she was asymptomatic and she has not been back for followup  PAIN in upper back: Still complains of pain in her right upper back lateral to the spine in the rib cage; this is of unclear etiology and  chest x-ray did not show any abnormality. Pain is intermittent and somewhat worse when she is trying to do her dishes or standing up. She thinks it feels like a spasm. Robaxin caused her to have excessive sedation.   Has tried various treatments without relief including Lidoderm patch. She is only taking tramadol p.r.n. Marland Kitchen She will sometimes take up to 2 tramadol at the time  Hypothyroidism:  She has been hypothyroid since 07/2008 with initial complaints of tiredness and sleepiness. In 2013 and early 2014 had required increases in the dosage dosage  She is compliant with her Synthroid and not taking iron at the same time.  Does not feel unusually fatigued.  Lab Results  Component Value Date   TSH 1.59 02/07/2013    ATRIAL fibrillation: She is in sinus  rhythm lately..She is not on Coumadin. She is also on diltiazem for hypertension and heart rate control.   Dyspnea:   less prominent. Still on oxygen. She was admitted for shortness of breath in 2014 and had another episode of pleural effusion treated by thoracentesis. Last chest x-ray shows no change in the effusion. She was evaluated by pulmonologist and has a high ANA titer but no other manifestations of lupus  History of Present Illness:    Medication List       This list is accurate as of: 05/29/13 10:25 AM.  Always use your most recent med list.               beta carotene w/minerals tablet  Take 1 tablet by mouth 2 (two) times daily.     calcium carbonate 1250 MG capsule  Take 1,250 mg by mouth 2 (two) times daily with a meal.     CARDIZEM CD 180 MG 24 hr capsule  Generic drug:  diltiazem  Take 180 mg by mouth daily.     cholecalciferol 1000 UNITS tablet  Commonly known as:  VITAMIN D  Take 1,000 Units by mouth every morning.     desoximetasone 0.05 % cream  Commonly known as:  TOPICORT  APPLY TOPICALLY ONCE DAILY AS DIRECTED.     FOLIVANE-PLUS Caps  TAKE 1 CAPSULE BY MOUTH ONCE DAILY WITH DINNER.     furosemide 40 MG tablet  Commonly known as:  LASIX  Take 1 tablet (  40 mg total) by mouth 2 (two) times daily.     glucosamine-chondroitin 500-400 MG tablet  Take 1 tablet by mouth 2 (two) times daily.     levothyroxine 150 MCG tablet  Commonly known as:  SYNTHROID, LEVOTHROID  Take 1 tablet (150 mcg total) by mouth daily before breakfast.     multivitamin with minerals Tabs tablet  Take 1 tablet by mouth every morning.     omeprazole 40 MG capsule  Commonly known as:  PRILOSEC  Take 40 mg by mouth every morning.     simvastatin 10 MG tablet  Commonly known as:  ZOCOR  Take 10 mg by mouth at bedtime.     traMADol 50 MG tablet  Commonly known as:  ULTRAM  TAKE 1 TABLET BY MOUTH 3 TIMES DAILY AS NEEDED FOR PAIN     zolpidem 5 MG tablet  Commonly known  as:  AMBIEN  TAKE 1 TABLET BY MOUTH AT BEDTIME AS NEEDED FOR SLEEP        Allergies:  Allergies  Allergen Reactions  . Lisinopril Other (See Comments)    pancreatitis    Past Medical History  Diagnosis Date  . Stroke   . Anemia   . Hyperlipidemia   . Vitamin D deficiency   . Hypothyroid   . Pulmonary hypertension   . Chronic respiratory failure with hypoxia   . Blindness of right eye     Macular degeneration  . Macular degeneration   . Hypertensive heart disease 04/16/2012  . CHF (congestive heart failure)   . Hypertension   . UTI (urinary tract infection)   . Arthritis     KNEES    Past Surgical History  Procedure Laterality Date  . Cataract extraction, bilateral      Family History  Problem Relation Age of Onset  . Heart disease Mother   . Heart disease Father   . Heart disease Brother   . Breast cancer Daughter   . Lymphoma Brother     Social History:  reports that she quit smoking about 15 years ago. Her smoking use included Cigarettes. She has a 25 pack-year smoking history. She has never used smokeless tobacco. She reports that she does not drink alcohol or use illicit drugs.  Review of Systems   She has significant visual impairment, had intraocular bleeding previously on Coumadin HYPERCHOLESTEROLEMIA: currently taking only small doses of simvastatin  Lab Results  Component Value Date   CHOL 186 05/01/2013   HDL 78.20 05/01/2013   LDLCALC 93 05/01/2013   LDLDIRECT 94.1 12/10/2012   TRIG 72.0 05/01/2013   CHOLHDL 2 05/01/2013   No recent swelling of the feet   LABS:  No visits with results within 1 Week(s) from this visit. Latest known visit with results is:  Appointment on 05/01/2013  Component Date Value Range Status  . Cholesterol 05/01/2013 186  0 - 200 mg/dL Final   ATP III Classification       Desirable:  < 200 mg/dL               Borderline High:  200 - 239 mg/dL          High:  > = 161 mg/dL  . Triglycerides 05/01/2013 72.0  0.0 -  149.0 mg/dL Final   Normal:  <096 mg/dLBorderline High:  150 - 199 mg/dL  . HDL 05/01/2013 78.20  >39.00 mg/dL Final  . VLDL 04/54/0981 14.4  0.0 - 40.0 mg/dL Final  . LDL Cholesterol 05/01/2013 93  0 - 99 mg/dL Final  . Total CHOL/HDL Ratio 05/01/2013 2   Final                  Men          Women1/2 Average Risk     3.4          3.3Average Risk          5.0          4.42X Average Risk          9.6          7.13X Average Risk          15.0          11.0                      . Sodium 05/01/2013 137  135 - 145 mEq/L Final  . Potassium 05/01/2013 4.3  3.5 - 5.1 mEq/L Final  . Chloride 05/01/2013 95* 96 - 112 mEq/L Final  . CO2 05/01/2013 33* 19 - 32 mEq/L Final  . Glucose, Bld 05/01/2013 115* 70 - 99 mg/dL Final  . BUN 16/10/960412/18/2014 43* 6 - 23 mg/dL Final  . Creatinine, Ser 05/01/2013 1.7* 0.4 - 1.2 mg/dL Final  . Total Bilirubin 05/01/2013 0.3  0.3 - 1.2 mg/dL Final  . Alkaline Phosphatase 05/01/2013 88  39 - 117 U/L Final  . AST 05/01/2013 26  0 - 37 U/L Final  . ALT 05/01/2013 11  0 - 35 U/L Final  . Total Protein 05/01/2013 7.6  6.0 - 8.3 g/dL Final  . Albumin 54/09/811912/18/2014 3.6  3.5 - 5.2 g/dL Final  . Calcium 14/78/295612/18/2014 9.3  8.4 - 10.5 mg/dL Final  . GFR 21/30/865712/18/2014 29.67* >60.00 mL/min Final    EXAM:  BP 118/60  Pulse 69  Temp(Src) 98.3 F (36.8 C)  Resp 12  Ht 5\' 5"  (1.651 m)  Wt 167 lb 1.6 oz (75.796 kg)  BMI 27.81 kg/m2  SpO2 95%  She is in no current distress, pleasant No tenderness on palpation of her rib cage history but she has a  mild deformity of the rib cage lateral to the spine at about the T6-T7 level She has vesicular breath sound with  a few coarse crepitations on the left base; no wheezing Breath sounds are present but reduced at the right base Heart sounds normal with ejection murmur at base No pedal edema  Assessment/Plan:    Rib cage pain: This is chronic and previous radiological studies have been negative and she has not had any relief with various  modalities of treatment. We'll give her a trial of Skelaxin half to 1 tablet as needed. Also consider baclofen. She can continue to take tramadol as needed since the pain is intermittent.   Hypothyroidism: Last TSH was in 9/14, will check on the next visit  Hyperlipidemia: LDL below 100 and 12/14  Renal dysfunction: Creatinine has been fluctuating within 1.7-2.0 range and now stable at 1.7    Anemia: normocytic normochromic but previously has had low iron saturation. To followup with  To followup  ? CHF: No recurrence  [name] 05/29/2013, 10:25 AM

## 2013-06-04 ENCOUNTER — Other Ambulatory Visit: Payer: Medicare Other

## 2013-06-04 ENCOUNTER — Ambulatory Visit: Payer: Medicare Other | Admitting: Oncology

## 2013-06-18 ENCOUNTER — Encounter (INDEPENDENT_AMBULATORY_CARE_PROVIDER_SITE_OTHER): Payer: Self-pay

## 2013-06-18 ENCOUNTER — Ambulatory Visit (HOSPITAL_BASED_OUTPATIENT_CLINIC_OR_DEPARTMENT_OTHER): Payer: Medicare Other | Admitting: Oncology

## 2013-06-18 ENCOUNTER — Other Ambulatory Visit (HOSPITAL_BASED_OUTPATIENT_CLINIC_OR_DEPARTMENT_OTHER): Payer: Medicare Other

## 2013-06-18 ENCOUNTER — Telehealth: Payer: Self-pay | Admitting: Oncology

## 2013-06-18 ENCOUNTER — Encounter: Payer: Self-pay | Admitting: Oncology

## 2013-06-18 VITALS — BP 132/46 | HR 75 | Temp 96.8°F | Resp 18 | Ht 65.0 in | Wt 167.7 lb

## 2013-06-18 DIAGNOSIS — D539 Nutritional anemia, unspecified: Secondary | ICD-10-CM

## 2013-06-18 DIAGNOSIS — N039 Chronic nephritic syndrome with unspecified morphologic changes: Principal | ICD-10-CM

## 2013-06-18 DIAGNOSIS — D509 Iron deficiency anemia, unspecified: Secondary | ICD-10-CM

## 2013-06-18 DIAGNOSIS — D649 Anemia, unspecified: Secondary | ICD-10-CM

## 2013-06-18 DIAGNOSIS — D631 Anemia in chronic kidney disease: Secondary | ICD-10-CM

## 2013-06-18 DIAGNOSIS — N189 Chronic kidney disease, unspecified: Secondary | ICD-10-CM

## 2013-06-18 LAB — CBC WITH DIFFERENTIAL/PLATELET
BASO%: 1.2 % (ref 0.0–2.0)
Basophils Absolute: 0.1 10*3/uL (ref 0.0–0.1)
EOS%: 2.7 % (ref 0.0–7.0)
Eosinophils Absolute: 0.2 10*3/uL (ref 0.0–0.5)
HCT: 30.9 % — ABNORMAL LOW (ref 34.8–46.6)
HGB: 9.9 g/dL — ABNORMAL LOW (ref 11.6–15.9)
LYMPH%: 10.8 % — ABNORMAL LOW (ref 14.0–49.7)
MCH: 27.2 pg (ref 25.1–34.0)
MCHC: 32 g/dL (ref 31.5–36.0)
MCV: 85 fL (ref 79.5–101.0)
MONO#: 1 10*3/uL — ABNORMAL HIGH (ref 0.1–0.9)
MONO%: 12.5 % (ref 0.0–14.0)
NEUT#: 5.6 10*3/uL (ref 1.5–6.5)
NEUT%: 72.8 % (ref 38.4–76.8)
Platelets: 350 10*3/uL (ref 145–400)
RBC: 3.63 10*6/uL — ABNORMAL LOW (ref 3.70–5.45)
RDW: 15.3 % — AB (ref 11.2–14.5)
WBC: 7.6 10*3/uL (ref 3.9–10.3)
lymph#: 0.8 10*3/uL — ABNORMAL LOW (ref 0.9–3.3)

## 2013-06-18 LAB — COMPREHENSIVE METABOLIC PANEL (CC13)
ALBUMIN: 3.5 g/dL (ref 3.5–5.0)
ALT: 13 U/L (ref 0–55)
AST: 20 U/L (ref 5–34)
Alkaline Phosphatase: 99 U/L (ref 40–150)
Anion Gap: 11 mEq/L (ref 3–11)
BUN: 42.7 mg/dL — ABNORMAL HIGH (ref 7.0–26.0)
CO2: 33 mEq/L — ABNORMAL HIGH (ref 22–29)
Calcium: 10.3 mg/dL (ref 8.4–10.4)
Chloride: 94 mEq/L — ABNORMAL LOW (ref 98–109)
Creatinine: 2 mg/dL — ABNORMAL HIGH (ref 0.6–1.1)
Glucose: 136 mg/dl (ref 70–140)
POTASSIUM: 4.3 meq/L (ref 3.5–5.1)
SODIUM: 138 meq/L (ref 136–145)
TOTAL PROTEIN: 8.1 g/dL (ref 6.4–8.3)
Total Bilirubin: 0.23 mg/dL (ref 0.20–1.20)

## 2013-06-18 NOTE — Progress Notes (Signed)
Hematology and Oncology Follow Up Visit  [name] [id number] 08-29-1924 80 y.o. 06/18/2013 1:26 PM [name], MDKumar, Viviano Simas, MD   Principle Diagnosis: 80 year old woman with multifactorial anemia she is an element of iron deficiency and anemia of renal disease. She was first evaluated in the 2014.  Current therapy: She is under evaluation for possible use of growth factor support.  Interim History:  This is a pleasant 80 year old woman presents today for a followup visit. I initially saw her back in October 2014 for the evaluation of normocytic normochromic anemia. Her hemoglobin at that time was 9.3 and a normal MCV. She had a normal differential at that time. Her workup revealed no abnormalities in her serum protein electrophoresis and iron levels 39 and saturation of 11%. Her creatinine was around 1.7 with a creatinine clearance of less than 30 cc per minute. Since her last visit, she had been doing relatively well. She has not reported any symptoms of fatigue or shortness of breath she is on oxygen for COPD but have not really had any changes in her performance status or activity level.  Medications: I have reviewed the patient's current medications.  Current Outpatient Prescriptions  Medication Sig Dispense Refill  . beta carotene w/minerals (OCUVITE) tablet Take 1 tablet by mouth 2 (two) times daily.       . calcium carbonate 1250 MG capsule Take 1,250 mg by mouth 2 (two) times daily with a meal.      . cholecalciferol (VITAMIN D) 1000 UNITS tablet Take 1,000 Units by mouth every morning.       . desoximetasone (TOPICORT) 0.05 % cream APPLY TOPICALLY ONCE DAILY AS DIRECTED.  60 g  1  . diltiazem (CARDIZEM CD) 180 MG 24 hr capsule Take 180 mg by mouth daily.      Marland Kitchen FeFum-FePoly-FA-B Cmp-C-Biot (FOLIVANE-PLUS) CAPS TAKE 1 CAPSULE BY MOUTH ONCE DAILY WITH DINNER.  30 capsule  6  . furosemide (LASIX) 40 MG tablet Take 1 tablet (40 mg total) by mouth 2 (two) times daily.  60 tablet  0  .  glucosamine-chondroitin 500-400 MG tablet Take 1 tablet by mouth 2 (two) times daily.       Marland Kitchen levothyroxine (SYNTHROID, LEVOTHROID) 150 MCG tablet Take 1 tablet (150 mcg total) by mouth daily before breakfast.  30 tablet  0  . metaxalone (SKELAXIN) 800 MG tablet Tid as needed  60 tablet  2  . Multiple Vitamin (MULITIVITAMIN WITH MINERALS) TABS Take 1 tablet by mouth every morning.       Marland Kitchen omeprazole (PRILOSEC) 40 MG capsule Take 40 mg by mouth every morning.       . simvastatin (ZOCOR) 10 MG tablet Take 10 mg by mouth at bedtime.      . traMADol (ULTRAM) 50 MG tablet TAKE 1 TABLET BY MOUTH 3 TIMES DAILY AS NEEDED FOR PAIN  270 tablet  5  . zolpidem (AMBIEN) 5 MG tablet TAKE 1 TABLET BY MOUTH AT BEDTIME AS NEEDED FOR SLEEP  30 tablet  5   No current facility-administered medications for this visit.     Allergies:  Allergies  Allergen Reactions  . Lisinopril Other (See Comments)    pancreatitis    Past Medical History, Surgical history, Social history, and Family History were reviewed and updated.  Review of Systems: Constitutional:  Negative for fever, chills, night sweats, anorexia, weight loss, pain.  Remaining ROS negative. Physical Exam: Blood pressure 132/46, pulse 75, temperature 96.8 F (36 C), temperature source Oral, resp. rate  18, height 5\' 5"  (1.651 m), weight 167 lb 11.2 oz (76.068 kg), SpO2 98.00%. ECOG: 2 General appearance: alert, cooperative and appears stated age Head: Normocephalic, without obvious abnormality, atraumatic Neck: no adenopathy, no carotid bruit, no JVD, supple, symmetrical, trachea midline and thyroid not enlarged, symmetric, no tenderness/mass/nodules Lymph nodes: Cervical, supraclavicular, and axillary nodes normal. Heart:regular rate and rhythm, S1, S2 normal, no murmur, click, rub or gallop Lung:chest clear, no wheezing, rales, normal symmetric air entry, Heart exam - S1, S2 normal, no murmur, no gallop, rate regular Abdomin: soft, non-tender,  without masses or organomegaly EXT:no erythema, induration, or nodules   Lab Results: Lab Results  Component Value Date   WBC 7.6 06/18/2013   HGB 9.9* 06/18/2013   HCT 30.9* 06/18/2013   MCV 85.0 06/18/2013   PLT 350 06/18/2013     Chemistry      Component Value Date/Time   NA 137 05/01/2013 1037   NA 139 02/25/2013 1349   K 4.3 05/01/2013 1037   K 4.7 02/25/2013 1349   CL 95* 05/01/2013 1037   CO2 33* 05/01/2013 1037   CO2 34* 02/25/2013 1349   BUN 43* 05/01/2013 1037   BUN 45.8* 02/25/2013 1349   CREATININE 1.7* 05/01/2013 1037   CREATININE 2.0* 02/25/2013 1349      Component Value Date/Time   CALCIUM 9.3 05/01/2013 1037   CALCIUM 9.9 02/25/2013 1349   ALKPHOS 88 05/01/2013 1037   ALKPHOS 100 02/25/2013 1349   AST 26 05/01/2013 1037   AST 22 02/25/2013 1349   ALT 11 05/01/2013 1037   ALT 8 02/25/2013 1349   BILITOT 0.3 05/01/2013 1037   BILITOT 0.23 02/25/2013 1349      Impression and Plan:  80 year old woman with the following issues:  1. Multifactorial anemia with an element of chronic renal insufficiency as well as iron deficiency. She is on oral iron supplement without any complications. Her hemoglobin is slightly higher today at 9.9. I discussed with her the role of IV iron as well as growth factor support in the role of Procrit or Aranesp and for the time being she'll deferred in light to stick with oral iron and use injectable if needed to in the future. We'll continue to monitor her and periodically check her CBC and revisit these issues.  2. Chronically insufficiency: Her baseline creatinine is between 1.7 and 2.0 without any evidence to suggest a plasma cell disorder.  [facility] Surgery Center LLCHADAD,Jillian Wesely, MD 2/4/20151:26 PM

## 2013-06-18 NOTE — Telephone Encounter (Signed)
Gave pt appt for lab and MD  for August 2015 °

## 2013-08-22 ENCOUNTER — Other Ambulatory Visit: Payer: Medicare Other

## 2013-08-27 ENCOUNTER — Ambulatory Visit: Payer: Medicare Other | Admitting: Endocrinology

## 2013-09-22 ENCOUNTER — Other Ambulatory Visit: Payer: Self-pay | Admitting: Endocrinology

## 2013-09-25 ENCOUNTER — Other Ambulatory Visit (INDEPENDENT_AMBULATORY_CARE_PROVIDER_SITE_OTHER): Payer: Medicare Other

## 2013-09-25 ENCOUNTER — Encounter: Payer: Self-pay | Admitting: Endocrinology

## 2013-09-25 ENCOUNTER — Other Ambulatory Visit: Payer: Self-pay | Admitting: *Deleted

## 2013-09-25 ENCOUNTER — Ambulatory Visit (INDEPENDENT_AMBULATORY_CARE_PROVIDER_SITE_OTHER): Payer: Medicare Other | Admitting: Endocrinology

## 2013-09-25 VITALS — BP 116/60 | HR 83 | Temp 98.4°F | Resp 16 | Ht 65.0 in | Wt 161.0 lb

## 2013-09-25 DIAGNOSIS — N183 Chronic kidney disease, stage 3 unspecified: Secondary | ICD-10-CM

## 2013-09-25 DIAGNOSIS — J9610 Chronic respiratory failure, unspecified whether with hypoxia or hypercapnia: Secondary | ICD-10-CM

## 2013-09-25 DIAGNOSIS — M546 Pain in thoracic spine: Secondary | ICD-10-CM

## 2013-09-25 DIAGNOSIS — R0902 Hypoxemia: Secondary | ICD-10-CM

## 2013-09-25 DIAGNOSIS — N039 Chronic nephritic syndrome with unspecified morphologic changes: Secondary | ICD-10-CM

## 2013-09-25 DIAGNOSIS — E039 Hypothyroidism, unspecified: Secondary | ICD-10-CM

## 2013-09-25 DIAGNOSIS — D631 Anemia in chronic kidney disease: Secondary | ICD-10-CM

## 2013-09-25 DIAGNOSIS — J9611 Chronic respiratory failure with hypoxia: Secondary | ICD-10-CM

## 2013-09-25 DIAGNOSIS — E7800 Pure hypercholesterolemia, unspecified: Secondary | ICD-10-CM

## 2013-09-25 DIAGNOSIS — N189 Chronic kidney disease, unspecified: Secondary | ICD-10-CM

## 2013-09-25 LAB — COMPREHENSIVE METABOLIC PANEL
ALBUMIN: 3.8 g/dL (ref 3.5–5.2)
ALT: 12 U/L (ref 0–35)
AST: 27 U/L (ref 0–37)
Alkaline Phosphatase: 83 U/L (ref 39–117)
BUN: 43 mg/dL — ABNORMAL HIGH (ref 6–23)
CALCIUM: 9.6 mg/dL (ref 8.4–10.5)
CHLORIDE: 95 meq/L — AB (ref 96–112)
CO2: 32 meq/L (ref 19–32)
Creatinine, Ser: 1.6 mg/dL — ABNORMAL HIGH (ref 0.4–1.2)
GFR: 31.75 mL/min — AB (ref >60.00)
GLUCOSE: 81 mg/dL (ref 70–99)
POTASSIUM: 4.2 meq/L (ref 3.5–5.1)
SODIUM: 136 meq/L (ref 135–145)
TOTAL PROTEIN: 7.7 g/dL (ref 6.0–8.3)
Total Bilirubin: 0.5 mg/dL (ref 0.2–1.2)

## 2013-09-25 LAB — TSH: TSH: 0.7 u[IU]/mL (ref 0.35–4.50)

## 2013-09-25 LAB — T4, FREE: Free T4: 1.13 ng/dL (ref 0.60–1.60)

## 2013-09-25 MED ORDER — TRAMADOL HCL 50 MG PO TABS: ORAL_TABLET | ORAL | Status: AC

## 2013-09-25 NOTE — Progress Notes (Signed)
Patient ID: [name], female   DOB: 11/09/1924, 80 y.o.   MRN: [id number]   Chief complaint: Followup of various issues  History of Present Illness:  PAIN in upper back: Still complains of pain in her right upper back lateral to the spine in the rib cage; this is of unclear etiology and  chest and rib x-rays are consistently normal. In 2011 also had a negative bone scan Pain is intermittent and somewhat worse when she is trying to do her dishes or standing up. She thinks it feels like a spasm. Skelaxin caused her to have excessive sedation.   Has tried various treatments without relief including Lidoderm patch. She is only taking tramadol p.r.n. with some relief   RENAL insufficiency: etiology unclear. Maybe related to nephrosclerosis. Creatinine has been higher since she was treated with Lasix for congestive heart failure. Has been on 40 mg of Lasix twice a day to control her CHF and is being treated by her cardiologist.   Lab Results  Component Value Date   CREATININE 2.0* 06/18/2013   Anemia:   She has had anemia for quite some time now and this is partly related to iron deficiency and also  chronic disease  Her last hemoglobin was 9.9, slightly better. She has been quite compliant with her  Maylon Cos plus (? Integra generic)  Previously Had GI bleeding from Coumadin and required transfusion in the hospital. She does not feel unusually short of breath or fatigue. No further recommendations were made after hematology consultation in 2/15, Procrit injection deferred because she was asymptomatic and she is due for followup in August  Hypothyroidism:  She has been hypothyroid since 07/2008 with initial complaints of tiredness and sleepiness. In 2013 and early 2014 had required increases in the dosage dosage  She is compliant with her Synthroid and not taking iron at the same time.  Does not feel unusually fatigued.  Lab Results  Component Value Date   TSH 1.59 02/07/2013    ATRIAL  fibrillation: She is in sinus rhythm lately..She is not on Coumadin. She is also on diltiazem for hypertension and heart rate control.   Dyspnea:  Is less prominent. Still on oxygen most of the time.  She was admitted for shortness of breath in 2014 and had another episode of pleural effusion treated by thoracentesis. Last chest x-ray shows no change in the effusion. She was evaluated by pulmonologist and has a high ANA titer but no other manifestations of lupus      Medication List       This list is accurate as of: 09/25/13  1:39 PM.  Always use your most recent med list.               beta carotene w/minerals tablet  Take 1 tablet by mouth 2 (two) times daily.     calcium carbonate 1250 MG capsule  Take 1,250 mg by mouth 2 (two) times daily with a meal.     CARDIZEM CD 180 MG 24 hr capsule  Generic drug:  diltiazem  Take 180 mg by mouth daily.     cholecalciferol 1000 UNITS tablet  Commonly known as:  VITAMIN D  Take 1,000 Units by mouth every morning.     desoximetasone 0.05 % cream  Commonly known as:  TOPICORT  APPLY TOPICALLY ONCE DAILY AS DIRECTED.     FOLIVANE-PLUS Caps  TAKE 1 CAPSULE BY MOUTH ONCE DAILY WITH DINNER.     furosemide 40 MG tablet  Commonly  known as:  LASIX  TAKE 1 TABLET BY MOUTH TWICE DAILY     glucosamine-chondroitin 500-400 MG tablet  Take 1 tablet by mouth 2 (two) times daily.     levothyroxine 150 MCG tablet  Commonly known as:  SYNTHROID, LEVOTHROID  TAKE 1 TABLET BY MOUTH EVERY MORNING ON AN EMPTY STOMACH.     metaxalone 800 MG tablet  Commonly known as:  SKELAXIN  Tid as needed     multivitamin with minerals Tabs tablet  Take 1 tablet by mouth every morning.     omeprazole 40 MG capsule  Commonly known as:  PRILOSEC  TAKE 1 CAPSULE BY MOUTH ONCE DAILY     simvastatin 10 MG tablet  Commonly known as:  ZOCOR  Take 10 mg by mouth at bedtime.     traMADol 50 MG tablet  Commonly known as:  ULTRAM  TAKE 1 TABLET BY MOUTH 3  TIMES DAILY AS NEEDED FOR PAIN     zolpidem 5 MG tablet  Commonly known as:  AMBIEN  TAKE 1 TABLET BY MOUTH AT BEDTIME AS NEEDED FOR SLEEP        Allergies:  Allergies  Allergen Reactions  . Lisinopril Other (See Comments)    pancreatitis    Past Medical History  Diagnosis Date  . Stroke   . Anemia   . Hyperlipidemia   . Vitamin D deficiency   . Hypothyroid   . Pulmonary hypertension   . Chronic respiratory failure with hypoxia   . Blindness of right eye     Macular degeneration  . Macular degeneration   . Hypertensive heart disease 04/16/2012  . CHF (congestive heart failure)   . Hypertension   . UTI (urinary tract infection)   . Arthritis     KNEES    Past Surgical History  Procedure Laterality Date  . Cataract extraction, bilateral      Family History  Problem Relation Age of Onset  . Heart disease Mother   . Heart disease Father   . Heart disease Brother   . Breast cancer Daughter   . Lymphoma Brother     Social History:  reports that she quit smoking about 15 years ago. Her smoking use included Cigarettes. She has a 25 pack-year smoking history. She has never used smokeless tobacco. She reports that she does not drink alcohol or use illicit drugs.  Review of Systems   She has significant visual impairment, had intraocular bleeding previously on Coumadin HYPERCHOLESTEROLEMIA: currently taking only small doses of simvastatin  Lab Results  Component Value Date   CHOL 186 05/01/2013   HDL 78.20 05/01/2013   LDLCALC 93 05/01/2013   LDLDIRECT 94.1 12/10/2012   TRIG 72.0 05/01/2013   CHOLHDL 2 05/01/2013   No recent swelling of the feet   LABS:  No visits with results within 1 Week(s) from this visit. Latest known visit with results is:  Appointment on 06/18/2013  Component Date Value Ref Range Status  . WBC 06/18/2013 7.6  3.9 - 10.3 10e3/uL Final  . NEUT# 06/18/2013 5.6  1.5 - 6.5 10e3/uL Final  . HGB 06/18/2013 9.9* 11.6 - 15.9 g/dL Final   . HCT 16/10/960402/08/2013 30.9* 34.8 - 46.6 % Final  . Platelets 06/18/2013 350  145 - 400 10e3/uL Final  . MCV 06/18/2013 85.0  79.5 - 101.0 fL Final  . MCH 06/18/2013 27.2  25.1 - 34.0 pg Final  . MCHC 06/18/2013 32.0  31.5 - 36.0 g/dL Final  . RBC 54/09/811902/08/2013  3.63* 3.70 - 5.45 10e6/uL Final  . RDW 06/18/2013 15.3* 11.2 - 14.5 % Final  . lymph# 06/18/2013 0.8* 0.9 - 3.3 10e3/uL Final  . MONO# 06/18/2013 1.0* 0.1 - 0.9 10e3/uL Final  . Eosinophils Absolute 06/18/2013 0.2  0.0 - 0.5 10e3/uL Final  . Basophils Absolute 06/18/2013 0.1  0.0 - 0.1 10e3/uL Final  . NEUT% 06/18/2013 72.8  38.4 - 76.8 % Final  . LYMPH% 06/18/2013 10.8* 14.0 - 49.7 % Final  . MONO% 06/18/2013 12.5  0.0 - 14.0 % Final  . EOS% 06/18/2013 2.7  0.0 - 7.0 % Final  . BASO% 06/18/2013 1.2  0.0 - 2.0 % Final  . Sodium 06/18/2013 138  136 - 145 mEq/L Final  . Potassium 06/18/2013 4.3  3.5 - 5.1 mEq/L Final  . Chloride 06/18/2013 94* 98 - 109 mEq/L Final  . CO2 06/18/2013 33* 22 - 29 mEq/L Final  . Glucose 06/18/2013 136  70 - 140 mg/dl Final  . BUN 91/47/829502/08/2013 42.7* 7.0 - 26.0 mg/dL Final  . Creatinine 62/13/086502/08/2013 2.0* 0.6 - 1.1 mg/dL Final  . Total Bilirubin 06/18/2013 0.23  0.20 - 1.20 mg/dL Final  . Alkaline Phosphatase 06/18/2013 99  40 - 150 U/L Final  . AST 06/18/2013 20  5 - 34 U/L Final  . ALT 06/18/2013 13  0 - 55 U/L Final  . Total Protein 06/18/2013 8.1  6.4 - 8.3 g/dL Final  . Albumin 78/46/962902/08/2013 3.5  3.5 - 5.0 g/dL Final  . Calcium 52/84/132402/08/2013 10.3  8.4 - 10.4 mg/dL Final  . Anion Gap 40/10/272502/08/2013 11  3 - 11 mEq/L Final    EXAM:  BP 116/60  Pulse 83  Temp(Src) 98.4 F (36.9 C)  Resp 16  Ht 5\' 5"  (1.651 m)  Wt 161 lb (73.029 kg)  BMI 26.79 kg/m2  SpO2 92%  She is in no  respiratory distress  No tenderness on palpation of her rib cage history but she has a  mild prominence of the rib cage lateral to the spine at about the T6-T7 level No pedal edema  Assessment/Plan:    Rib cage pain: This is chronic  and previous radiological studies have been negative and she has not had any relief with various modalities of treatment. She agrees to see Dr. Ethelene Halamos for the pain management, alternatively can go to a pain management clinic   Hypothyroidism: Last TSH was in 9/14, will check  Hyperlipidemia: LDL below 100 previously  Renal dysfunction: Creatinine has been fluctuating within 1.7-2.0 range    Anemia: normocytic normochromic but previously has had low iron saturation. Asymptomatic and is being followed by hematologist  Respiratory failure: Stable and no occurrence of pleural effusion  Reather Littlerjay Arietta Eisenstein 09/25/2013, 1:39 PM

## 2013-09-26 NOTE — Progress Notes (Signed)
Quick Note:  Please let patient know that thyroid is okay, kidney test better ______

## 2013-10-29 ENCOUNTER — Telehealth: Payer: Self-pay | Admitting: Endocrinology

## 2013-10-29 NOTE — Telephone Encounter (Signed)
[facility] current [facility] for pt, pt is blind that's why Maxine GlennMonica need to make sure everything good on her med list. Please fax med list to Gaspar BiddingMonica Clay at [phone] if this is ok.

## 2013-11-27 ENCOUNTER — Other Ambulatory Visit: Payer: Self-pay | Admitting: Endocrinology

## 2013-12-01 ENCOUNTER — Ambulatory Visit (INDEPENDENT_AMBULATORY_CARE_PROVIDER_SITE_OTHER): Payer: Medicare Other | Admitting: Internal Medicine

## 2013-12-01 ENCOUNTER — Other Ambulatory Visit (INDEPENDENT_AMBULATORY_CARE_PROVIDER_SITE_OTHER): Payer: Medicare Other

## 2013-12-01 ENCOUNTER — Encounter (INDEPENDENT_AMBULATORY_CARE_PROVIDER_SITE_OTHER): Payer: Self-pay

## 2013-12-01 ENCOUNTER — Ambulatory Visit (INDEPENDENT_AMBULATORY_CARE_PROVIDER_SITE_OTHER)
Admission: RE | Admit: 2013-12-01 | Discharge: 2013-12-01 | Disposition: A | Discharge status: Home / Self Care | Payer: Medicare Other | Source: Ambulatory Visit | Attending: Internal Medicine | Admitting: Internal Medicine

## 2013-12-01 ENCOUNTER — Encounter: Payer: Self-pay | Admitting: Internal Medicine

## 2013-12-01 VITALS — BP 140/58 | HR 76 | Temp 97.6°F | Ht 64.75 in | Wt 162.0 lb

## 2013-12-01 DIAGNOSIS — R0989 Other specified symptoms and signs involving the circulatory and respiratory systems: Secondary | ICD-10-CM

## 2013-12-01 DIAGNOSIS — R0600 Dyspnea, unspecified: Secondary | ICD-10-CM

## 2013-12-01 DIAGNOSIS — R0609 Other forms of dyspnea: Secondary | ICD-10-CM

## 2013-12-01 DIAGNOSIS — E039 Hypothyroidism, unspecified: Secondary | ICD-10-CM

## 2013-12-01 DIAGNOSIS — J9610 Chronic respiratory failure, unspecified whether with hypoxia or hypercapnia: Secondary | ICD-10-CM

## 2013-12-01 DIAGNOSIS — R0902 Hypoxemia: Secondary | ICD-10-CM

## 2013-12-01 DIAGNOSIS — J9611 Chronic respiratory failure with hypoxia: Secondary | ICD-10-CM

## 2013-12-01 DIAGNOSIS — J90 Pleural effusion, not elsewhere classified: Secondary | ICD-10-CM

## 2013-12-01 LAB — CBC WITH DIFFERENTIAL/PLATELET
BASOS PCT: 0.5 % (ref 0.0–3.0)
Basophils Absolute: 0 10*3/uL (ref 0.0–0.1)
EOS PCT: 3 % (ref 0.0–5.0)
Eosinophils Absolute: 0.2 10*3/uL (ref 0.0–0.7)
HCT: 32.7 % — ABNORMAL LOW (ref 36.0–46.0)
HEMOGLOBIN: 10.8 g/dL — AB (ref 12.0–15.0)
Lymphocytes Relative: 14.8 % (ref 12.0–46.0)
Lymphs Abs: 1.1 10*3/uL (ref 0.7–4.0)
MCHC: 33.2 g/dL (ref 30.0–36.0)
MCV: 85.9 fl (ref 78.0–100.0)
MONO ABS: 1 10*3/uL (ref 0.1–1.0)
Monocytes Relative: 13.5 % — ABNORMAL HIGH (ref 3.0–12.0)
Neutro Abs: 4.9 10*3/uL (ref 1.4–7.7)
Neutrophils Relative %: 68.2 % (ref 43.0–77.0)
Platelets: 318 10*3/uL (ref 150.0–400.0)
RBC: 3.8 Mil/uL — AB (ref 3.87–5.11)
RDW: 16.1 % — ABNORMAL HIGH (ref 11.5–15.5)
WBC: 7.2 10*3/uL (ref 4.0–10.5)

## 2013-12-01 LAB — BASIC METABOLIC PANEL
BUN: 44 mg/dL — ABNORMAL HIGH (ref 6–23)
CHLORIDE: 96 meq/L (ref 96–112)
CO2: 32 meq/L (ref 19–32)
Calcium: 9.5 mg/dL (ref 8.4–10.5)
Creatinine, Ser: 1.7 mg/dL — ABNORMAL HIGH (ref 0.4–1.2)
GFR: 30.44 mL/min — ABNORMAL LOW (ref >60.00)
Glucose, Bld: 108 mg/dL — ABNORMAL HIGH (ref 70–99)
POTASSIUM: 4.2 meq/L (ref 3.5–5.1)
Sodium: 137 mEq/L (ref 135–145)

## 2013-12-01 LAB — SEDIMENTATION RATE: Sed Rate: 88 mm/hr — ABNORMAL HIGH (ref 0–22)

## 2013-12-01 LAB — BRAIN NATRIURETIC PEPTIDE: Pro B Natriuretic peptide (BNP): 98 pg/mL (ref 0.0–100.0)

## 2013-12-01 NOTE — Progress Notes (Signed)
Subjective:     Patient ID: [name], female   DOB: [birth year],   MRN: [id number]  HPI   [age] yowf with chronic transudative effusion f/u by Lavinia SharpsM Ramaswamy previously referred to pulmonary clinic for ? Lung mass/ effusion on MRI courtesy of Dr Duane LopeAlan Ross   12/01/2013 f/u ov/[name] re:  Chief Complaint  Patient presents with  . Acute Visit    Referred today per Dr [name] for eval of lung mass.  Pt states mass found on MRI that she had done to eval back pain. She c/o increased SOB for the past 2 wks.     Back pain worse and sits up a lot in recliner but not worse sob  Sob with chores around the house on 2lpm "worse than usual" x sev weeks corresponding to worse back pain > MRI showing "lung mass" Ok when walking slow/Flat on 2lpm, problems with inclines only   No obvious day to day or daytime variabilty or assoc chronic cough or cp or chest tightness, subjective wheeze overt sinus or hb symptoms. No unusual exp hx or h/o childhood pna/ asthma or knowledge of premature birth.  Sleeping ok in recliner without nocturnal  or early am exacerbation  of respiratory  c/o's or need for noct saba. Also denies any obvious fluctuation of symptoms with weather or environmental changes or other aggravating or alleviating factors except as outlined above   Current Medications, Allergies, Complete Past Medical History, Past Surgical History, Family History, and Social History were reviewed in [facility] electronic medical record.  ROS  The following are not active complaints unless bolded sore throat, dysphagia, dental problems, itching, sneezing,  nasal congestion or excess/ purulent secretions, ear ache,   fever, chills, sweats, unintended wt loss, pleuritic or exertional cp, hemoptysis,  orthopnea pnd or leg swelling, presyncope, palpitations, heartburn, abdominal pain, anorexia, nausea, vomiting, diarrhea  or change in bowel or urinary habits, change in stools or urine, dysuria,hematuria,  rash, arthralgias,  visual complaints, headache, numbness weakness or ataxia or problems with walking or coordination,  change in mood/affect or memory.         Review of Systems     Objective:   Physical Exam  Anxious wf very hard of hearing  Wt Readings from Last 3 Encounters:  12/01/13 162 lb (73.483 kg)  09/25/13 161 lb (73.029 kg)  06/18/13 167 lb 11.2 oz (76.068 kg)      HEENT: nl dentition, turbinates, and orophanx. Nl external ear canals without cough reflex   NECK :  without JVD/Nodes/TM/ nl carotid upstrokes bilaterally   LUNGS: no acc muscle use,  Dullness with decreased bs R base only    CV:  RRR  no s3 or murmur or increase in P2, no edema   ABD:  soft and nontender with nl excursion in the supine position. No bruits or organomegaly, bowel sounds nl  MS:  warm without deformities, calf tenderness, cyanosis or clubbing  SKIN: warm and dry without lesions    NEURO:  alert, approp, no deficits    chest X-ray 12/01/13 Stable lung volumes. Moderate size right pleural effusion persists  and has not significantly changed in size or configuration.  Superimposed patchy right infrahilar opacity also not significantly  changed. No pneumothorax or pulmonary edema. Stable left lung  parenchyma     Lab Results  Component Value Date   ESRSEDRATE 88* 12/01/2013   ESRSEDRATE 98* 11/14/2012   ESRSEDRATE 105* 09/02/2012     Lab Results  Component  Value Date   PROBNP 98.0 12/01/2013    Recent Labs Lab 12/01/13 1507  NA 137  K 4.2  CL 96  CO2 32  BUN 44*  CREATININE 1.7*  GLUCOSE 108*    Recent Labs Lab 12/01/13 1507  HGB 10.8*  HCT 32.7*  WBC 7.2  PLT 318.0      Lab Results  Component Value Date   TSH 2.24 12/01/2013         Assessment:

## 2013-12-01 NOTE — Patient Instructions (Addendum)
Please remember to go to the lab and x-ray department downstairs for your tests - we will call you with the results when they are available.    Follow up with Dr Marchelle Gearingamaswamy in 2 weeks, to ER in meantime if breathing on 02 gets worse or adjust the 02 to a goal of over 90%

## 2013-12-02 LAB — TSH: TSH: 2.24 u[IU]/mL (ref 0.35–4.50)

## 2013-12-02 NOTE — Assessment & Plan Note (Addendum)
-   Echo 11/13/08 c/w AS/MS and mod PAH - Spirometry 02/21/12 mostly restrictive changes with ratio 64 - ONO RA 03/05/12 > 267 min @ < 89% so rec 2lpm and repeat ono on 2lpm 03/06/2012 > resolved by ono 2lpm 03/21/12  Lab Results  Component Value Date   PROBNP 98.0 12/01/2013    Lab Results  Component Value Date   CREATININE 1.7* 12/01/2013   CREATININE 1.6* 09/25/2013   CREATININE 2.0* 06/18/2013   CREATININE 1.7* 05/01/2013   CREATININE 2.0* 02/25/2013      No evidence of chf at this point and on dry side so no increase lasix needed

## 2013-12-02 NOTE — Assessment & Plan Note (Signed)
tsh now nl, unlikely related to effusion at this point

## 2013-12-02 NOTE — Assessment & Plan Note (Signed)
-   Echo 11/13/08 c/w AS/MS and mod PAH - Spirometry 02/21/12 mostly restrictive changes with ratio 64 - ONO RA 03/05/12 > 267 min @ < 89% so rec 2lpm and repeat ono on 2lpm 03/06/2012 > resolved by ono 2lpm 03/21/12 - 07/17/2012 02 sat 87% at rest then  Walked 2lpm x one lap @ 185 stopped due to  desat 84%> resolved on 4lpm - 12/01/2013  Walked 2lpm  2 laps @ 185 ft each stopped due to  Fatigue > sob/ no desat   Rx  As of 12/01/13  = 2lpm  Sleeping and with any activity/ 1lpm at rest

## 2013-12-02 NOTE — Assessment & Plan Note (Addendum)
-   R thoracentesis 04/18/12  x 1400 cc transudate  -Repeat 05/17/12  X 1300 cc   No new findings on cxr and I suspect the MR dx of "lung mass" is result of chronic effusion/ atx and nothing more (strongly doubt mass)  Not clear the benefit of pursuing more fluid analysis at this point is > risk of repeat t-centesis as likely as adhesions from prev studies but defer the final call to Dr Marchelle Gearingamaswamy, her pulmonologist of record.  Discussed in detail all the  indications, usual  risks and alternatives  relative to the benefits with patient who agrees to proceed with continued conservative rx .

## 2013-12-03 NOTE — Progress Notes (Signed)
Quick Note:  Spoke with pt and notified of results per Dr. Wert. Pt verbalized understanding and denied any questions.  ______ 

## 2013-12-15 ENCOUNTER — Ambulatory Visit: Payer: Medicare Other | Admitting: Adult Health

## 2013-12-16 ENCOUNTER — Other Ambulatory Visit: Payer: Medicare Other

## 2013-12-16 ENCOUNTER — Ambulatory Visit: Payer: Medicare Other | Admitting: Oncology

## 2013-12-19 ENCOUNTER — Other Ambulatory Visit: Payer: Self-pay | Admitting: Endocrinology

## 2013-12-19 NOTE — Telephone Encounter (Signed)
Patient is requesting a refill of this medication, I don't see it in her list.  Okay to fill? FOLIVANE PLUS CAP.

## 2013-12-26 ENCOUNTER — Ambulatory Visit: Payer: Medicare Other | Admitting: Endocrinology

## 2013-12-29 ENCOUNTER — Telehealth: Payer: Self-pay | Admitting: Endocrinology

## 2013-12-29 ENCOUNTER — Other Ambulatory Visit: Payer: Self-pay | Admitting: Endocrinology

## 2013-12-29 NOTE — Telephone Encounter (Signed)
No follow up scheduled, what is Topicort for?

## 2013-12-29 NOTE — Telephone Encounter (Signed)
Please see below and advise.

## 2013-12-29 NOTE — Telephone Encounter (Signed)
Call jim at [facility] can they alternate the topicort to either desernide or flusemilone.

## 2013-12-29 NOTE — Telephone Encounter (Signed)
Patient said it's for eczema.

## 2013-12-30 ENCOUNTER — Other Ambulatory Visit: Payer: Self-pay | Admitting: *Deleted

## 2013-12-30 ENCOUNTER — Encounter: Payer: Self-pay | Admitting: Internal Medicine

## 2013-12-30 ENCOUNTER — Telehealth: Payer: Self-pay | Admitting: Emergency Medicine

## 2013-12-30 ENCOUNTER — Ambulatory Visit (INDEPENDENT_AMBULATORY_CARE_PROVIDER_SITE_OTHER): Payer: Medicare Other | Admitting: Internal Medicine

## 2013-12-30 VITALS — BP 144/78 | HR 64 | Ht 64.0 in | Wt 167.0 lb

## 2013-12-30 DIAGNOSIS — J90 Pleural effusion, not elsewhere classified: Secondary | ICD-10-CM

## 2013-12-30 MED ORDER — TRIAMCINOLONE ACETONIDE 0.5 % EX OINT: 1.0000 "application " | TOPICAL_OINTMENT | Freq: Two times a day (BID) | CUTANEOUS | Status: AC

## 2013-12-30 NOTE — Patient Instructions (Addendum)
Rt interscapular  Pain   - likely difficult to treat myofascial spasm but could be made worse by right pleural effusion  Plan  - do diagnostic and therapeutic right thoracentesis - will set it up at [facility]

## 2013-12-30 NOTE — Telephone Encounter (Signed)
rx sent, called patients daughter to let her mom know to make an appointment

## 2013-12-30 NOTE — Telephone Encounter (Signed)
Triamcinolone 0.5% cream, 30 g tube, no refill. Please make followup appointment

## 2013-12-30 NOTE — Progress Notes (Signed)
Subjective:    Patient ID: [name], female    DOB: 01-15-1925, 80 y.o.   MRN: [id number]  HPI OV 11/14/12 80 year old female. Former smoker. Mentally sharp thugh hard of hearing. Presents with daughter. Switiching care from  Dr MW to me (MR) Main concern  Is chronic right pleural efuffusion for years and associted dyspnea and associated hypoxemia on oxygen since dec 2013   Has chronic right pleural effusion since July 2010 per my review of imaging (cxr feb 2010 ws clear).  Transudative effusion per thoracenttesis Dec 2013, Jan 2014 and April 2014 thora with non-diagnostic cytology each time and negative flow cytometry in Jan 2014 Normal liver on [place] Feb 2010 and CT abdomen June 2011 Ana negative - dec 2013 ECHO April 2014  - Mild to Moderate mitral stenosis but normal EF Chronic diastolic CHF due to valvulaer heart dz - per DR Donnie Aho notes May 2014 (BNP 500-700 APril - May 2014) KNown to have chronic renal insuff - creat  1.6gm% And gfr 30s. No hx of proetinuria check Known to have hypothuodisim - April 2014 TSH 11.47 and synthroid increased and subsequently normalized  This results in class 3 dyspnea and also says she desaturates when she stands up. REview of records show she has exertional desaturations that correct with o2    REC Unclear cause of pleural effusion  Conitnue oxygen for now  Maybe thyroid is the problem +/- heart issues is the problem  Do blood test to rule out autoimmune disease  Do 24h urine protein collection to rule out renal cause  IF keeps recurring and we do not find A cause- discussed pleurX catheter and this might be best solution to help shortness of breah  Will call you with test results to decide next step   OV 02/24/2013  Chronic right transudative pleural effusion followup. Presence of daughter [name]  - At last visit further evaluation for transudative pleural effusion was done. 24-hour urine protein collection was normal. Autoimmune tests were  normal. Therefore we are left with diastolic heart failure as etiology of right-sided chronic transudative pleural effusion. Otherwise this is idiopathic. She has not had any for this chest x-ray since May 2014  - Her pulse oximetry today was 88% on room air at rest. Oxygen 2 L nasal cannula helps with dyspnea.  Past medical history reviewed: She has some anemia and she got a see Dr. Venda Rodes for the same   12/01/2013 f/u ov/Wert re:  Chief Complaint  Patient presents with  . Acute Visit    Referred today per Dr Tenny Craw for eval of lung mass.  Pt states mass found on MRI that she had done to eval back pain. She c/o increased SOB for the past 2 wks.     Back pain worse and sits up a lot in recliner but not worse sob  Sob with chores around the house on 2lpm "worse than usual" x sev weeks corresponding to worse back pain > MRI showing "lung mass" Ok when walking slow/Flat on 2lpm, problems with inclines only   No obvious day to day or daytime variabilty or assoc chronic cough or cp or chest tightness, subjective wheeze overt sinus or hb symptoms. No unusual exp hx or h/o childhood pna/ asthma or knowledge of premature birth.  Sleeping ok in recliner without nocturnal  or early am exacerbation  of respiratory  c/o's or need for noct saba. Also denies any obvious fluctuation of symptoms with weather or environmental  changes or other aggravating or alleviating factors except as outlined above     OV 12/30/2013  Chief Complaint  Patient presents with  . Follow-up    Pt last seen by MW on 12/01/2013 for an acute visit. Pt states her breathing has not improved since last OV. Pt c/o DOE and right subscapular pain. Pt denies cough and CP/tightness.      Followup chronic right pleural effusion that has been deemed a transudate   - Not seen her since October 2014. She says that for the last several years she has had chronic right interscapular myofascial spasm and pain of insidious onset. Made worse  by certain movements but relieved with massage and applying direct trigger point therapy. This has gotten progressively worse in the last several months. She did see orthopedic physician for this and apparently an MRI of the spine was done and she was told that she only has "spurs". During this time discussion revolved around the possibility that the pleural effusion could be causing some of the symptoms which are described as moderate and severe. Therefore she is here. Husband says that there was some confusion initially in the orthopedic office for this pleural effusion constituted a mass on the spinal film but this has been excluded. Most recently chest x-ray 12/01/2013 shows persistent of chronic stable moderate to large right pleural effusion. She absolutely wants the hypothesis of the fact that the pleural effusion can be causing her symptoms ruled out. There are no other issues Review of Systems  Constitutional: Negative for fever and unexpected weight change.  HENT: Negative for congestion, dental problem, ear pain, nosebleeds, postnasal drip, rhinorrhea, sinus pressure, sneezing, sore throat and trouble swallowing.   Eyes: Negative for redness and itching.  Respiratory: Positive for shortness of breath. Negative for cough, chest tightness and wheezing.   Cardiovascular: Negative for palpitations and leg swelling.  Gastrointestinal: Negative for nausea and vomiting.  Genitourinary: Negative for dysuria.  Musculoskeletal: Negative for joint swelling.  Skin: Negative for rash.  Neurological: Negative for headaches.  Hematological: Does not bruise/bleed easily.  Psychiatric/Behavioral: Negative for dysphoric mood. The patient is not nervous/anxious.        Objective:   Physical Exam  Vitals reviewed. Constitutional: She is oriented to person, place, and time. She appears well-developed and well-nourished. No distress.  Blind right eye O2 on  HENT:  Head: Normocephalic and atraumatic.    Right Ear: External ear normal.  Left Ear: External ear normal.  Mouth/Throat: Oropharynx is clear and moist. No oropharyngeal exudate.  Eyes: Conjunctivae and EOM are normal. Pupils are equal, round, and reactive to light. Right eye exhibits no discharge. Left eye exhibits no discharge. No scleral icterus.  Neck: Normal range of motion. Neck supple. No JVD present. No tracheal deviation present. No thyromegaly present.  Cardiovascular: Normal rate, regular rhythm, normal heart sounds and intact distal pulses.  Exam reveals no gallop and no friction rub.   No murmur heard. Pulmonary/Chest: Effort normal and breath sounds normal. No respiratory distress. She has no wheezes. She has no rales. She exhibits no tenderness.  Kyphotic Stony dullness and decreased AE on right Trigger points present on right chest  Abdominal: Soft. Bowel sounds are normal. She exhibits no distension and no mass. There is no tenderness. There is no rebound and no guarding.  Musculoskeletal: Normal range of motion. She exhibits no edema and no tenderness.  Lymphadenopathy:    She has no cervical adenopathy.  Neurological: She is alert and oriented  to person, place, and time. She has normal reflexes. No cranial nerve deficit. She exhibits normal muscle tone. Coordination normal.  Skin: Skin is warm and dry. No rash noted. She is not diaphoretic. No erythema. No pallor.  Psychiatric: She has a normal mood and affect. Her behavior is normal. Judgment and thought content normal.     Filed Vitals:   12/30/13 1000  BP: 144/78  Pulse: 64  Height: 5\' 4"  (1.626 m)  Weight: 167 lb (75.751 kg)  SpO2: 98%         Assessment & Plan:  Rt interscapular  Pain   - likely difficult to treat myofascial spasm but could be made worse by right pleural effusion  Plan  - do diagnostic and therapeutic right thoracentesis - will set it up at [place] [facility]

## 2013-12-30 NOTE — Telephone Encounter (Signed)
Called and spoke to pt. Informed pt of time, date and location of thoracent[facility] Pt verbalized understanding and denied any further questions or concerns at this time.

## 2014-01-04 NOTE — Assessment & Plan Note (Signed)
Rt interscapular  Pain   - likely difficult to treat myofascial spasm but could be made worse by right pleural effusion  Plan  - do diagnostic and therapeutic right thoracentesis - will set it up at [facility]  The fluid removal will be done by PCCM service   - they will remove not more than 1.5L  - fluid labs to be sent: cell count, gram stain and culture, cytology for malignant cells, chemistries for LDH, albumin,  Protein, glucose, triglyceride and lipase  - blood labs to be sent on same day / time: cbc, ldh, protein, albumin glucose, and lipase   Risks of pneumothorax, hemothorax, vasovagal syncope, REPE all explained. Patient agrees and willing to proceed  > 50% of this > 25 min visit spent in face to face counseling (15 min visit converted to 25 min)

## 2014-01-12 ENCOUNTER — Ambulatory Visit (HOSPITAL_COMMUNITY)
Admission: RE | Admit: 2014-01-12 | Discharge: 2014-01-12 | Disposition: A | Discharge status: Home / Self Care | Payer: Medicare Other | Source: Ambulatory Visit | Attending: Internal Medicine | Admitting: Internal Medicine

## 2014-01-12 ENCOUNTER — Ambulatory Visit (HOSPITAL_COMMUNITY)
Admission: RE | Admit: 2014-01-12 | Discharge: 2014-01-12 | Disposition: A | Discharge status: Home / Self Care | Payer: Medicare Other | Source: Ambulatory Visit | Attending: Pulmonary Disease | Admitting: Pulmonary Disease

## 2014-01-12 VITALS — BP 150/65 | HR 71 | Resp 23

## 2014-01-12 DIAGNOSIS — Z9889 Other specified postprocedural states: Secondary | ICD-10-CM | POA: Diagnosis not present

## 2014-01-12 DIAGNOSIS — J438 Other emphysema: Secondary | ICD-10-CM | POA: Insufficient documentation

## 2014-01-12 DIAGNOSIS — J9819 Other pulmonary collapse: Secondary | ICD-10-CM | POA: Diagnosis not present

## 2014-01-12 DIAGNOSIS — J90 Pleural effusion, not elsewhere classified: Secondary | ICD-10-CM | POA: Diagnosis not present

## 2014-01-12 DIAGNOSIS — I517 Cardiomegaly: Secondary | ICD-10-CM | POA: Insufficient documentation

## 2014-01-12 LAB — BODY FLUID CELL COUNT WITH DIFFERENTIAL
Eos, Fluid: 0 %
Lymphs, Fluid: 79 %
Monocyte-Macrophage-Serous Fluid: 5 % — ABNORMAL LOW (ref 50–90)
NEUTROPHIL FLUID: 16 % (ref 0–25)
Total Nucleated Cell Count, Fluid: 381 cu mm (ref 0–1000)

## 2014-01-12 LAB — PROTEIN, BODY FLUID: Total protein, fluid: 2 g/dL

## 2014-01-12 LAB — LACTATE DEHYDROGENASE, PLEURAL OR PERITONEAL FLUID: LD FL: 96 U/L — AB (ref 3–23)

## 2014-01-12 SURGERY — Surgical Case
Anesthesia: *Unknown

## 2014-01-12 MED ORDER — IBUPROFEN 600 MG PO TABS
600.0000 mg | ORAL_TABLET | Freq: Once | ORAL | Status: AC
  Administered 2014-01-12: 600 mg via ORAL
  Filled 2014-01-12: qty 1

## 2014-01-12 NOTE — Procedures (Signed)
Thoracentesis Procedure Note  Pre-operative Diagnosis: right pleural effusion  Post-operative Diagnosis: loculated right pleural effusion   Indications: diagnostics   Procedure Details  Consent: Informed consent was obtained. Risks of the procedure were discussed including: infection, bleeding, pain, pneumothorax.  Under sterile conditions the patient was positioned. Betadine solution and sterile drapes were utilized.  1% buffered lidocaine was used to anesthetize the pleural space which was identified via real time [place] and noted to be loculated to the right . Fluid was obtained without any difficulties and minimal blood loss.  A dressing was applied to the wound and wound care instructions were provided.   Findings 250 ml of turbid blood tinged pleural fluid was obtained. A sample was sent to Pathology, cell counts, as well as for micro studies.  Complications:  None; patient tolerated the procedure well.  Did have some pain at end of procedure suspect due what is almost certainly exudative effusion and pleural irritation        Condition: stable  Plan A follow up chest x-ray was ordered. Tylenol 650 mg. for pain.  I was present for and supervised the entire procedure performed by ACNP Alonza Smoker, MD ; [facility] service [place] [phone].  After 5:30 PM or weekends, call [phone]

## 2014-01-12 NOTE — Progress Notes (Signed)
Pre procedure BP 180/48, RR 13, Sat 92, HR 77. PA Pete with drew 200 cc fluid and samples sent to lab. Post procedure BP 142/60, RR 25, Sat 92, HR 72. Pt. did very well during procedure, Dr. Lenna Gilford ibuprofen 600 mg given by endo nurse.CXR ordered and taken per MD. CXR good for pt to leave.  Billy Fischer, MD ; [facility] [phone].  After 5:30 PM or weekends, call [phone]

## 2014-01-14 LAB — ADENOSINE DEAMINASE, FLUID: ADENOSINE DEAMINASE FL: 8.8 U/L — AB (ref <7.6)

## 2014-01-15 LAB — ANA, BODY FLUID: ANTI-NUCLEAR AB, IGG: NOT DETECTED

## 2014-01-16 LAB — BODY FLUID CULTURE
Culture: NO GROWTH
SPECIAL REQUESTS: NORMAL

## 2014-01-16 LAB — RHEUMATOID FACTORS, FLUID: CORTISOL #1 (BASE): NEGATIVE

## 2014-01-26 ENCOUNTER — Telehealth: Payer: Self-pay | Admitting: Internal Medicine

## 2014-01-26 NOTE — Telephone Encounter (Signed)
They are supposed to fu 02/02/14  Results from thora was never forwarded to me by ordereing doc. Looks transudate and non diagnostic.   Called to give [name] results but she deferred to husband   - paind back of chest did not improve with thora  - explained VATS thoracoscopy needed to dx fluid but concern of risks so likely defer  - explained we monitor with uncertainty and if got worse can do pleurx - he likes this approach  Will discuss again during ov 9/21/5  Dr. Kalman Shan, M.D., F.C.C.P Pulmonary and Critical Care Medicine Staff Physician [facility] Pager: [phone], If no answer or between  15:00h - 7:00h: call [phone]  01/26/2014 6:42 PM

## 2014-01-26 NOTE — Telephone Encounter (Signed)
Spoke with patient-she is aware that MR [place] at this [place] and I will send message to him to advise on results asap as patient has been waiting for a couple of weeks now.   MR please advise tonight so we may call the patient tomorrow with results. Thanks.

## 2014-02-02 ENCOUNTER — Ambulatory Visit (INDEPENDENT_AMBULATORY_CARE_PROVIDER_SITE_OTHER): Payer: Medicare Other | Admitting: Internal Medicine

## 2014-02-02 ENCOUNTER — Encounter: Payer: Self-pay | Admitting: Internal Medicine

## 2014-02-02 VITALS — BP 132/60 | HR 82 | Ht 64.0 in | Wt 155.0 lb

## 2014-02-02 DIAGNOSIS — J90 Pleural effusion, not elsewhere classified: Secondary | ICD-10-CM

## 2014-02-02 NOTE — Patient Instructions (Signed)
Unclear cause of recurrent right sided effusion pleura Glad you are better after most recent fluid removal Do not recommend surgical VATS procedure for diagnosis Recommend continued observation If pleural fluid recurrence become major issue: then will recommend PleurX catheter  Followup  9 months or sooner if needed

## 2014-02-02 NOTE — Progress Notes (Signed)
   Subjective:    Patient ID: [name], female    DOB: 1924/07/16, 80 y.o.   MRN: [id number]  HPI    OV 02/02/2014  FU recurrent right pleural effusion  Underwsent rt thora 01/12/14  Due ot myofascial symptoms. Dyspnea improved after that but myofascial spasms persists. FLuid is transudate and non-diagnostic agaih. SHe has lost 10# after reducing salt intake and weight monitoring. Feeling well overall. She is not inclined for VATS procedure appropriately. She is accepting of expectant approach. No new issues.    Review of Systems  Constitutional: Negative for fever and unexpected weight change.  HENT: Negative for congestion, dental problem, ear pain, nosebleeds, postnasal drip, rhinorrhea, sinus pressure, sneezing, sore throat and trouble swallowing.   Eyes: Negative for redness and itching.  Respiratory: Negative for cough, chest tightness, shortness of breath and wheezing.   Cardiovascular: Negative for palpitations and leg swelling.  Gastrointestinal: Negative for nausea and vomiting.  Genitourinary: Negative for dysuria.  Musculoskeletal: Negative for joint swelling.  Skin: Negative for rash.  Neurological: Negative for headaches.  Hematological: Does not bruise/bleed easily.  Psychiatric/Behavioral: Negative for dysphoric mood. The patient is not nervous/anxious.        Objective:   Physical Exam  Filed Vitals:   02/02/14 1334  BP: 132/60  Pulse: 82  Height:  (1.626 m)  Weight: 155 lb (70.308 kg)  SpO2: 96%   als reviewed. Constitutional: She is oriented to person, place, and time. She appears well-developed and well-nourished. No distress.  Blind right eye O2 on  HENT:  Head: Normocephalic and atraumatic.  Right Ear: External ear normal.  Left Ear: External ear normal.  Mouth/Throat: Oropharynx is clear and moist. No oropharyngeal exudate.  Eyes: Conjunctivae and EOM are normal. Pupils are equal, round, and reactive to light. Right eye exhibits no  discharge. Left eye exhibits no discharge. No scleral icterus.  Neck: Normal range of motion. Neck supple. No JVD present. No tracheal deviation present. No thyromegaly present.  Cardiovascular: Normal rate, regular rhythm, normal heart sounds and intact distal pulses.  Exam reveals no gallop and no friction rub.   No murmur heard. Pulmonary/Chest: Effort normal and breath sounds normal. No respiratory distress. She has no wheezes. She has no rales. She exhibits no tenderness.  Improved air eentry on right Trigger points present on right chest  Abdominal: Soft. Bowel sounds are normal. She exhibits no distension and no mass. There is no tenderness. There is no rebound and no guarding.Kyphotic  Musculoskeletal: Normal range of motion. She exhibits no edema and no tenderness.  Lymphadenopathy:    She has no cervical adenopathy.  Neurological: She is alert and oriented to person, place, and time. She has normal reflexes. No cranial nerve deficit. She exhibits normal muscle tone. Coordination normal.  Skin: Skin is warm and dry. No rash noted. She is not diaphoretic. No erythema. No pallor.  Psychiatric: She has a normal mood and affect. Her behavior is normal. Judgment and thought content normal.          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

## 2014-03-23 ENCOUNTER — Other Ambulatory Visit: Payer: Self-pay | Admitting: Endocrinology

## 2014-03-26 ENCOUNTER — Ambulatory Visit (INDEPENDENT_AMBULATORY_CARE_PROVIDER_SITE_OTHER)
Admission: RE | Admit: 2014-03-26 | Discharge: 2014-03-26 | Disposition: A | Discharge status: Home / Self Care | Payer: Medicare Other | Source: Ambulatory Visit | Attending: Internal Medicine | Admitting: Internal Medicine

## 2014-03-26 ENCOUNTER — Ambulatory Visit (INDEPENDENT_AMBULATORY_CARE_PROVIDER_SITE_OTHER): Payer: Medicare Other | Admitting: Internal Medicine

## 2014-03-26 ENCOUNTER — Encounter: Payer: Self-pay | Admitting: Internal Medicine

## 2014-03-26 ENCOUNTER — Other Ambulatory Visit (INDEPENDENT_AMBULATORY_CARE_PROVIDER_SITE_OTHER): Payer: Medicare Other

## 2014-03-26 VITALS — BP 112/60 | HR 67 | Temp 98.0°F | Ht 64.75 in | Wt 164.0 lb

## 2014-03-26 DIAGNOSIS — R0600 Dyspnea, unspecified: Secondary | ICD-10-CM

## 2014-03-26 DIAGNOSIS — J90 Pleural effusion, not elsewhere classified: Secondary | ICD-10-CM

## 2014-03-26 DIAGNOSIS — E038 Other specified hypothyroidism: Secondary | ICD-10-CM

## 2014-03-26 DIAGNOSIS — J9611 Chronic respiratory failure with hypoxia: Secondary | ICD-10-CM

## 2014-03-26 LAB — CBC WITH DIFFERENTIAL/PLATELET
BASOS ABS: 0 10*3/uL (ref 0.0–0.1)
Basophils Relative: 0.5 % (ref 0.0–3.0)
EOS ABS: 0.2 10*3/uL (ref 0.0–0.7)
Eosinophils Relative: 3.6 % (ref 0.0–5.0)
HCT: 30.6 % — ABNORMAL LOW (ref 36.0–46.0)
Hemoglobin: 9.7 g/dL — ABNORMAL LOW (ref 12.0–15.0)
LYMPHS PCT: 10.6 % — AB (ref 12.0–46.0)
Lymphs Abs: 0.7 10*3/uL (ref 0.7–4.0)
MCHC: 31.8 g/dL (ref 30.0–36.0)
MCV: 86.2 fl (ref 78.0–100.0)
Monocytes Absolute: 0.8 10*3/uL (ref 0.1–1.0)
Monocytes Relative: 11.3 % (ref 3.0–12.0)
NEUTROS PCT: 74 % (ref 43.0–77.0)
Neutro Abs: 5.1 10*3/uL (ref 1.4–7.7)
PLATELETS: 322 10*3/uL (ref 150.0–400.0)
RBC: 3.55 Mil/uL — ABNORMAL LOW (ref 3.87–5.11)
RDW: 15.4 % (ref 11.5–15.5)
WBC: 6.9 10*3/uL (ref 4.0–10.5)

## 2014-03-26 LAB — BASIC METABOLIC PANEL
BUN: 44 mg/dL — ABNORMAL HIGH (ref 6–23)
CALCIUM: 9.6 mg/dL (ref 8.4–10.5)
CO2: 35 mEq/L — ABNORMAL HIGH (ref 19–32)
Chloride: 93 mEq/L — ABNORMAL LOW (ref 96–112)
Creatinine, Ser: 1.8 mg/dL — ABNORMAL HIGH (ref 0.4–1.2)
GFR: 28.46 mL/min — AB (ref >60.00)
Glucose, Bld: 121 mg/dL — ABNORMAL HIGH (ref 70–99)
Potassium: 4.4 mEq/L (ref 3.5–5.1)
SODIUM: 136 meq/L (ref 135–145)

## 2014-03-26 LAB — TSH: TSH: 2.93 u[IU]/mL (ref 0.35–4.50)

## 2014-03-26 LAB — SEDIMENTATION RATE: SED RATE: 81 mm/h — AB (ref 0–22)

## 2014-03-26 LAB — BRAIN NATRIURETIC PEPTIDE: Pro B Natriuretic peptide (BNP): 186 pg/mL — ABNORMAL HIGH (ref 0.0–100.0)

## 2014-03-26 MED ORDER — SPIRONOLACTONE 25 MG PO TABS: 25.0000 mg | ORAL_TABLET | Freq: Every day | ORAL | Status: AC

## 2014-03-26 NOTE — Patient Instructions (Signed)
Change 02 to where you increase to 3lpm just while walking/ otherwise leave on 2lpm  Add aldactone 25 mg each am to your present meds   Please remember to go to the lab and x-ray department downstairs for your tests - we will call you with the results when they are available.   See Ramaswamy 945 am Dec 4th

## 2014-03-26 NOTE — Progress Notes (Signed)
Subjective:    Patient ID: [name], female    DOB: May 09, 1925,     MRN: [id number]  HPI     OV 02/02/2014  FU recurrent right pleural effusion  Underwsent rt thora 01/12/14  Due ot myofascial symptoms. Dyspnea improved after that but myofascial spasms persists. FLuid is transudate and non-diagnostic agaih. SHe has lost 10# after reducing salt intake and weight monitoring. Feeling well overall. She is not inclined for VATS procedure appropriately. She is accepting of expectant approach. No new issues.  rec Unclear cause of recurrent right sided effusion pleura Glad you are better after most recent fluid removal Do not recommend surgical VATS procedure for diagnosis Recommend continued observation   03/26/2014 acute  ov/[name] re: worse sob  Chief Complaint  Patient presents with  . Acute Visit    Pt c/o increased DOE and cough for x 2 wks. She relates cough to PND- prod with clear sputum. She states that she is SOB walking short distances like to the bathroom from her bedroom. She also c/o SOB when just lying down.   onset was indolent, pattern is progressive doe to point where sob across the room on 2lpm ? Using consistently   No obvious day to day or daytime variabilty or assoc chronic cough or cp or chest tightness, subjective wheeze overt sinus or hb symptoms. No unusual exp hx or h/o childhood pna/ asthma or knowledge of premature birth.  Sleeping ok without nocturnal  or early am exacerbation  of respiratory  c/o's or need for noct saba. Also denies any obvious fluctuation of symptoms with weather or environmental changes or other aggravating or alleviating factors except as outlined above   Current Medications, Allergies, Complete Past Medical History, Past Surgical History, Family History, and Social History were reviewed in [facility] electronic medical record.  ROS  The following are not active complaints unless bolded sore throat, dysphagia, dental problems,  itching, sneezing,  nasal congestion or excess/ purulent secretions, ear ache,   fever, chills, sweats, unintended wt loss, pleuritic or exertional cp, hemoptysis,  orthopnea pnd or leg swelling, presyncope, palpitations, heartburn, abdominal pain, anorexia, nausea, vomiting, diarrhea  or change in bowel or urinary habits, change in stools or urine, dysuria,hematuria,  rash, arthralgias, visual complaints, headache, numbness weakness or ataxia or problems with walking or coordination,  change in mood/affect or memory.              Objective:   Physical Exam   amb wf nad  Wt Readings from Last 3 Encounters:  03/26/14 164 lb (74.39 kg)  02/02/14 155 lb (70.308 kg)  12/30/13 167 lb (75.751 kg)    Vital signs reviewed  HEENT: nl dentition, turbinates, and orophanx. Nl external ear canals without cough reflex   NECK :  without JVD/Nodes/TM/ nl carotid upstrokes bilaterally   LUNGS: no acc muscle use, minimal decrease bs R base with dullness  CV:  RRR  no s3 or murmur or increase in P2,  Trace  bilateral pitting lower ext edema   ABD:  soft and nontender with nl excursion in the supine position. No bruits or organomegaly, bowel sounds nl  MS:  warm without deformities, calf tenderness, cyanosis or clubbing  SKIN: warm and dry without lesions    NEURO:  alert, approp, no deficits     cxr 03/26/14 Little change in loculated right pleural effusion and adjacent airspace disease. Correlation with prior thoracentesis recommended. This could be further evaluated with CT if clinically  warranted.    Recent Labs Lab 03/26/14 1504  NA 136  K 4.4  CL 93*  CO2 35*  BUN 44*  CREATININE 1.8*  GLUCOSE 121*   Lab Results  Component Value Date   CREATININE 1.8* 03/26/2014   CREATININE 1.7* 12/01/2013   CREATININE 1.6* 09/25/2013   CREATININE 2.0* 06/18/2013   CREATININE 2.0* 02/25/2013     Recent Labs Lab 03/26/14 1504  HGB 9.7*  HCT 30.6*  WBC 6.9  PLT 322.0     Lab  Results  Component Value Date   TSH 2.93 03/26/2014     Lab Results  Component Value Date   PROBNP 186.0* 03/26/2014     Lab Results  Component Value Date   ESRSEDRATE 81* 03/26/2014             Assessment & Plan:

## 2014-03-28 NOTE — Assessment & Plan Note (Signed)
-   Echo 11/13/08 c/w AS/MS and mod PH - Spirometry 02/21/12 mostly restrictive changes with ratio 64 - ONO RA 03/05/12 > 267 min @ < 89% so rec 2lpm and repeat ono on 2lpm 03/06/2012 > resolved by ono 2lpm 03/21/12 - 07/17/2012 02 sat 87% at rest then  Walked 2lpm x one lap @ 185 stopped due to  desat 84%> resolved on 4lpm - 12/01/2013  Walked 2lpm  2 laps @ 185 ft each stopped due to  Fatigue > sob/ no desat  - 03/26/2014  Walked 2 lpm  1 laps @ 185 ft each stopped due to  Sob and desat to 77% so rec increase to 3lpm with walking   Using 2lpm 24/7 as of 03/26/2014  > increased to 3lpm with activity 03/26/14

## 2014-03-28 NOTE — Assessment & Plan Note (Signed)
-   R thoracentesis 04/18/12  x 1400 cc transudate  -Repeat 05/17/12  X 1300 cc  -01/12/14 x 250 cc = transudative features    Since transudative and already on lasix with improving creat trend and still some peripheral edema rec add aldactone 25 mg q am and f/u with Dr Marchelle Gearingamaswamy in 2 weeks

## 2014-03-28 NOTE — Assessment & Plan Note (Addendum)
Lab Results  Component Value Date   TSH 2.93 03/26/2014     Adequate control on present rx, reviewed not likely to be contributing to effusion  > no change in rx needed

## 2014-04-17 ENCOUNTER — Ambulatory Visit: Payer: Medicare Other | Admitting: Internal Medicine

## 2014-04-26 ENCOUNTER — Inpatient Hospital Stay (HOSPITAL_COMMUNITY)
Admission: EM | Admit: 2014-04-26 | Discharge: 2014-05-15 | DRG: 208 | Disposition: E | Payer: Medicare Other | Attending: Internal Medicine | Admitting: Internal Medicine

## 2014-04-26 ENCOUNTER — Inpatient Hospital Stay (HOSPITAL_COMMUNITY): Payer: Medicare Other

## 2014-04-26 ENCOUNTER — Encounter (HOSPITAL_COMMUNITY): Payer: Self-pay | Admitting: Emergency Medicine

## 2014-04-26 ENCOUNTER — Emergency Department (HOSPITAL_COMMUNITY): Payer: Medicare Other

## 2014-04-26 DIAGNOSIS — I4892 Unspecified atrial flutter: Secondary | ICD-10-CM | POA: Diagnosis present

## 2014-04-26 DIAGNOSIS — K819 Cholecystitis, unspecified: Secondary | ICD-10-CM

## 2014-04-26 DIAGNOSIS — I480 Paroxysmal atrial fibrillation: Secondary | ICD-10-CM | POA: Diagnosis present

## 2014-04-26 DIAGNOSIS — J9601 Acute respiratory failure with hypoxia: Secondary | ICD-10-CM

## 2014-04-26 DIAGNOSIS — H3530 Unspecified macular degeneration: Secondary | ICD-10-CM | POA: Diagnosis present

## 2014-04-26 DIAGNOSIS — Z79899 Other long term (current) drug therapy: Secondary | ICD-10-CM | POA: Diagnosis not present

## 2014-04-26 DIAGNOSIS — I5023 Acute on chronic systolic (congestive) heart failure: Secondary | ICD-10-CM | POA: Diagnosis not present

## 2014-04-26 DIAGNOSIS — I052 Rheumatic mitral stenosis with insufficiency: Secondary | ICD-10-CM | POA: Diagnosis present

## 2014-04-26 DIAGNOSIS — Z66 Do not resuscitate: Secondary | ICD-10-CM | POA: Diagnosis present

## 2014-04-26 DIAGNOSIS — R0602 Shortness of breath: Secondary | ICD-10-CM | POA: Diagnosis present

## 2014-04-26 DIAGNOSIS — Z978 Presence of other specified devices: Secondary | ICD-10-CM

## 2014-04-26 DIAGNOSIS — G9341 Metabolic encephalopathy: Secondary | ICD-10-CM | POA: Diagnosis not present

## 2014-04-26 DIAGNOSIS — I272 Other secondary pulmonary hypertension: Secondary | ICD-10-CM | POA: Diagnosis present

## 2014-04-26 DIAGNOSIS — R6521 Severe sepsis with septic shock: Secondary | ICD-10-CM | POA: Diagnosis not present

## 2014-04-26 DIAGNOSIS — E039 Hypothyroidism, unspecified: Secondary | ICD-10-CM | POA: Diagnosis present

## 2014-04-26 DIAGNOSIS — K810 Acute cholecystitis: Secondary | ICD-10-CM | POA: Diagnosis present

## 2014-04-26 DIAGNOSIS — E872 Acidosis: Secondary | ICD-10-CM | POA: Diagnosis present

## 2014-04-26 DIAGNOSIS — Z9981 Dependence on supplemental oxygen: Secondary | ICD-10-CM

## 2014-04-26 DIAGNOSIS — Z8673 Personal history of transient ischemic attack (TIA), and cerebral infarction without residual deficits: Secondary | ICD-10-CM

## 2014-04-26 DIAGNOSIS — J9621 Acute and chronic respiratory failure with hypoxia: Secondary | ICD-10-CM | POA: Diagnosis not present

## 2014-04-26 DIAGNOSIS — I469 Cardiac arrest, cause unspecified: Secondary | ICD-10-CM | POA: Diagnosis not present

## 2014-04-26 DIAGNOSIS — F419 Anxiety disorder, unspecified: Secondary | ICD-10-CM | POA: Diagnosis present

## 2014-04-26 DIAGNOSIS — E875 Hyperkalemia: Secondary | ICD-10-CM | POA: Diagnosis present

## 2014-04-26 DIAGNOSIS — H5441 Blindness, right eye, normal vision left eye: Secondary | ICD-10-CM | POA: Diagnosis present

## 2014-04-26 DIAGNOSIS — D631 Anemia in chronic kidney disease: Secondary | ICD-10-CM | POA: Diagnosis present

## 2014-04-26 DIAGNOSIS — I5032 Chronic diastolic (congestive) heart failure: Secondary | ICD-10-CM | POA: Diagnosis present

## 2014-04-26 DIAGNOSIS — M170 Bilateral primary osteoarthritis of knee: Secondary | ICD-10-CM | POA: Diagnosis present

## 2014-04-26 DIAGNOSIS — A419 Sepsis, unspecified organism: Secondary | ICD-10-CM | POA: Diagnosis not present

## 2014-04-26 DIAGNOSIS — I130 Hypertensive heart and chronic kidney disease with heart failure and stage 1 through stage 4 chronic kidney disease, or unspecified chronic kidney disease: Secondary | ICD-10-CM | POA: Diagnosis present

## 2014-04-26 DIAGNOSIS — R1011 Right upper quadrant pain: Secondary | ICD-10-CM

## 2014-04-26 DIAGNOSIS — R739 Hyperglycemia, unspecified: Secondary | ICD-10-CM | POA: Diagnosis present

## 2014-04-26 DIAGNOSIS — Z87891 Personal history of nicotine dependence: Secondary | ICD-10-CM | POA: Diagnosis not present

## 2014-04-26 DIAGNOSIS — J189 Pneumonia, unspecified organism: Secondary | ICD-10-CM | POA: Diagnosis present

## 2014-04-26 DIAGNOSIS — R000 Tachycardia, unspecified: Secondary | ICD-10-CM

## 2014-04-26 DIAGNOSIS — E785 Hyperlipidemia, unspecified: Secondary | ICD-10-CM | POA: Diagnosis present

## 2014-04-26 DIAGNOSIS — J90 Pleural effusion, not elsewhere classified: Secondary | ICD-10-CM | POA: Diagnosis present

## 2014-04-26 DIAGNOSIS — J9600 Acute respiratory failure, unspecified whether with hypoxia or hypercapnia: Secondary | ICD-10-CM | POA: Diagnosis present

## 2014-04-26 DIAGNOSIS — N184 Chronic kidney disease, stage 4 (severe): Secondary | ICD-10-CM | POA: Diagnosis present

## 2014-04-26 DIAGNOSIS — I248 Other forms of acute ischemic heart disease: Secondary | ICD-10-CM | POA: Diagnosis present

## 2014-04-26 DIAGNOSIS — N179 Acute kidney failure, unspecified: Secondary | ICD-10-CM | POA: Diagnosis present

## 2014-04-26 DIAGNOSIS — N189 Chronic kidney disease, unspecified: Secondary | ICD-10-CM

## 2014-04-26 DIAGNOSIS — G8929 Other chronic pain: Secondary | ICD-10-CM | POA: Diagnosis present

## 2014-04-26 DIAGNOSIS — N183 Chronic kidney disease, stage 3 (moderate): Secondary | ICD-10-CM

## 2014-04-26 HISTORY — DX: Chronic kidney disease, stage 3 (moderate): N18.3

## 2014-04-26 HISTORY — DX: Pleural effusion, not elsewhere classified: J90

## 2014-04-26 HISTORY — DX: Unspecified osteoarthritis, unspecified site: M19.90

## 2014-04-26 HISTORY — DX: Hypothyroidism, unspecified: E03.9

## 2014-04-26 HISTORY — DX: Rheumatic mitral stenosis: I05.0

## 2014-04-26 HISTORY — DX: Chronic kidney disease, unspecified: N18.9

## 2014-04-26 HISTORY — DX: Personal history of transient ischemic attack (TIA), and cerebral infarction without residual deficits: Z86.73

## 2014-04-26 HISTORY — DX: Chronic diastolic (congestive) heart failure: I50.32

## 2014-04-26 HISTORY — DX: Anemia in chronic kidney disease: D63.1

## 2014-04-26 HISTORY — DX: Chronic kidney disease, stage 3 unspecified: N18.30

## 2014-04-26 LAB — COMPREHENSIVE METABOLIC PANEL
ALK PHOS: 120 U/L — AB (ref 39–117)
ALT: 18 U/L (ref 0–35)
AST: 39 U/L — ABNORMAL HIGH (ref 0–37)
Albumin: 3.3 g/dL — ABNORMAL LOW (ref 3.5–5.2)
Anion gap: 16 — ABNORMAL HIGH (ref 5–15)
BUN: 79 mg/dL — ABNORMAL HIGH (ref 6–23)
CO2: 25 meq/L (ref 19–32)
Calcium: 9.5 mg/dL (ref 8.4–10.5)
Chloride: 96 mEq/L (ref 96–112)
Creatinine, Ser: 2.69 mg/dL — ABNORMAL HIGH (ref 0.50–1.10)
GFR, EST AFRICAN AMERICAN: 17 mL/min — AB (ref >90)
GFR, EST NON AFRICAN AMERICAN: 15 mL/min — AB (ref >90)
GLUCOSE: 126 mg/dL — AB (ref 70–99)
POTASSIUM: 6.2 meq/L — AB (ref 3.7–5.3)
Sodium: 137 mEq/L (ref 137–147)
Total Bilirubin: 0.2 mg/dL — ABNORMAL LOW (ref 0.3–1.2)
Total Protein: 7.9 g/dL (ref 6.0–8.3)

## 2014-04-26 LAB — CBC WITH DIFFERENTIAL/PLATELET
Basophils Absolute: 0 10*3/uL (ref 0.0–0.1)
Basophils Relative: 0 % (ref 0–1)
EOS ABS: 0.2 10*3/uL (ref 0.0–0.7)
Eosinophils Relative: 2 % (ref 0–5)
HCT: 30 % — ABNORMAL LOW (ref 36.0–46.0)
HEMOGLOBIN: 9.3 g/dL — AB (ref 12.0–15.0)
LYMPHS ABS: 0.9 10*3/uL (ref 0.7–4.0)
LYMPHS PCT: 12 % (ref 12–46)
MCH: 27.3 pg (ref 26.0–34.0)
MCHC: 31 g/dL (ref 30.0–36.0)
MCV: 88 fL (ref 78.0–100.0)
MONOS PCT: 11 % (ref 3–12)
Monocytes Absolute: 0.8 10*3/uL (ref 0.1–1.0)
NEUTROS PCT: 75 % (ref 43–77)
Neutro Abs: 5.5 10*3/uL (ref 1.7–7.7)
Platelets: 378 10*3/uL (ref 150–400)
RBC: 3.41 MIL/uL — AB (ref 3.87–5.11)
RDW: 17.2 % — ABNORMAL HIGH (ref 11.5–15.5)
WBC: 7.4 10*3/uL (ref 4.0–10.5)

## 2014-04-26 LAB — URINALYSIS, ROUTINE W REFLEX MICROSCOPIC
BILIRUBIN URINE: NEGATIVE
Glucose, UA: NEGATIVE mg/dL
HGB URINE DIPSTICK: NEGATIVE
KETONES UR: NEGATIVE mg/dL
NITRITE: NEGATIVE
PROTEIN: NEGATIVE mg/dL
Specific Gravity, Urine: 1.015 (ref 1.005–1.030)
UROBILINOGEN UA: 0.2 mg/dL (ref 0.0–1.0)
pH: 5 (ref 5.0–8.0)

## 2014-04-26 LAB — TROPONIN I: Troponin I: <0.3 ng/mL (ref <0.30)

## 2014-04-26 LAB — PRO B NATRIURETIC PEPTIDE: Pro B Natriuretic peptide (BNP): 15788 pg/mL — ABNORMAL HIGH (ref 0–450)

## 2014-04-26 LAB — URINE MICROSCOPIC-ADD ON

## 2014-04-26 LAB — D-DIMER, QUANTITATIVE (NOT AT ARMC): D DIMER QUANT: 3.57 ug{FEU}/mL — AB (ref 0.00–0.48)

## 2014-04-26 LAB — POTASSIUM: POTASSIUM: 5.7 meq/L — AB (ref 3.7–5.3)

## 2014-04-26 MED ORDER — FUROSEMIDE 10 MG/ML IJ SOLN
40.0000 mg | Freq: Once | INTRAMUSCULAR | Status: AC
  Administered 2014-04-26: 40 mg via INTRAVENOUS
  Filled 2014-04-26: qty 4

## 2014-04-26 MED ORDER — FENTANYL CITRATE 0.05 MG/ML IJ SOLN
25.0000 ug | Freq: Once | INTRAMUSCULAR | Status: AC
  Administered 2014-04-26: 25 ug via INTRAVENOUS
  Filled 2014-04-26: qty 2

## 2014-04-26 MED ORDER — MORPHINE SULFATE 2 MG/ML IJ SOLN: 2.0000 mg | INTRAMUSCULAR | Status: DC | PRN

## 2014-04-26 MED ORDER — DILTIAZEM HCL ER COATED BEADS 180 MG PO CP24: 180.0000 mg | ORAL_CAPSULE | Freq: Every day | ORAL | Status: DC

## 2014-04-26 MED ORDER — SODIUM CHLORIDE 0.9 % IV BOLUS (SEPSIS)
250.0000 mL | Freq: Once | INTRAVENOUS | Status: AC
  Administered 2014-04-26: 250 mL via INTRAVENOUS

## 2014-04-26 MED ORDER — DILTIAZEM HCL 25 MG/5ML IV SOLN
10.0000 mg | Freq: Once | INTRAVENOUS | Status: AC
  Administered 2014-04-26: 10 mg via INTRAVENOUS
  Filled 2014-04-26: qty 5

## 2014-04-26 NOTE — ED Notes (Signed)
Pt here from home with c/o of increase SOB no wheezing. States her oxygen drops when she stands. 3L of oxygen normally. HR has been increased for the past 2 days. AAO.

## 2014-04-26 NOTE — ED Notes (Signed)
Patient made aware of sample needed. Patient will call out when ready to give sample. Will need assistance and walker to get to the restroom.

## 2014-04-26 NOTE — ED Provider Notes (Signed)
CSN: [id number]     Arrival date & time 05/04/2014  1724 History   First MD Initiated Contact with Patient 04/20/2014 1748     Chief Complaint  Patient presents with  . Shortness of Breath     (Consider location/radiation/quality/duration/timing/severity/associated sxs/prior Treatment) Patient is a 80 y.o. female presenting with shortness of breath.  Shortness of Breath Severity:  Severe Onset quality:  Gradual Duration: several weeks. Timing:  Constant Progression:  Worsening Chronicity:  New Context: emotional upset (pt lost her daughter 3 weeks ago.)   Context comment:  History of recurrent right pleural effusions.  Last drained at the end of September. Relieved by:  Rest Worsened by:  Exertion Associated symptoms: no chest pain, no cough and no fever   Associated symptoms comment:  Right thoracic back pain   Past Medical History  Diagnosis Date  . Stroke   . Anemia   . Hyperlipidemia   . Vitamin D deficiency   . Hypothyroid   . Pulmonary hypertension   . Chronic respiratory failure with hypoxia   . Blindness of right eye     Macular degeneration  . Macular degeneration   . Hypertensive heart disease 04/16/2012  . CHF (congestive heart failure)   . Hypertension   . UTI (urinary tract infection)   . Arthritis     KNEES   Past Surgical History  Procedure Laterality Date  . Cataract extraction, bilateral     Family History  Problem Relation Age of Onset  . Heart disease Mother   . Heart disease Father   . Heart disease Brother   . Breast cancer Daughter   . Lymphoma Brother    History  Substance Use Topics  . Smoking status: Former Smoker -- 0.50 packs/day for 50 years    Types: Cigarettes    Quit date: 05/15/1978  . Smokeless tobacco: Never Used  . Alcohol Use: No   OB History    No data available     Review of Systems  Constitutional: Negative for fever.  Respiratory: Positive for shortness of breath. Negative for cough.   Cardiovascular: Negative  for chest pain.  All other systems reviewed and are negative.     Allergies  Lisinopril  Home Medications   Prior to Admission medications   Medication Sig Start Date End Date Taking? Authorizing Provider  beta carotene w/minerals (OCUVITE) tablet Take 1 tablet by mouth 2 (two) times daily.    Yes Historical Provider, MD  calcium carbonate 1250 MG capsule Take 1,250 mg by mouth 2 (two) times daily with a meal.   Yes Historical Provider, MD  cholecalciferol (VITAMIN D) 1000 UNITS tablet Take 1,000 Units by mouth every morning.    Yes Historical Provider, MD  diltiazem (CARDIZEM CD) 180 MG 24 hr capsule Take 180 mg by mouth daily.   Yes Historical Provider, MD  FeFum-FePoly-FA-B Cmp-C-Biot (FOLIVANE-PLUS) CAPS TAKE 1 CAPSULE BY MOUTH ONCE DAILY WITH DINNER 12/26/13  Yes Reather LittlerAjay Kumar, MD  fluocinonide cream (LIDEX) 0.05 % Apply 1 application topically 2 (two) times daily. To legs   Yes Historical Provider, MD  furosemide (LASIX) 40 MG tablet TAKE 1 TABLET BY MOUTH TWICE DAILY 09/22/13  Yes Reather LittlerAjay Kumar, MD  glucosamine-chondroitin 500-400 MG tablet Take 1 tablet by mouth 2 (two) times daily.    Yes Historical Provider, MD  HYDROcodone-acetaminophen (NORCO) 10-325 MG per tablet Take 1 tablet by mouth 4 (four) times daily as needed for severe pain.   Yes Historical Provider, MD  levothyroxine (  SYNTHROID, LEVOTHROID) 150 MCG tablet TAKE 1 TABLET BY MOUTH EVERY MORNING ON AN EMPTY STOMACH. 09/22/13  Yes [name], MD  Melatonin 3 MG CAPS Take 1 capsule by mouth as needed (sleep).    Yes Historical Provider, MD  meloxicam (MOBIC) 7.5 MG tablet Take 1 tablet by mouth daily.   Yes Historical Provider, MD  Multiple Vitamin (MULITIVITAMIN WITH MINERALS) TABS Take 1 tablet by mouth every morning.    Yes Historical Provider, MD  omeprazole (PRILOSEC) 40 MG capsule TAKE 1 CAPSULE BY MOUTH ONCE DAILY 09/22/13  Yes [name], MD  simvastatin (ZOCOR) 10 MG tablet Take 10 mg by mouth at bedtime.   Yes Historical  Provider, MD  spironolactone (ALDACTONE) 25 MG tablet Take 1 tablet (25 mg total) by mouth daily. 03/26/14  Yes Nyoka Cowden, MD  desoximetasone (TOPICORT) 0.05 % cream APPLY TO AFFECTED AREA(S) ONCE DAILY AS DIRECTED 12/29/13   [name], MD  traMADol (ULTRAM) 50 MG tablet TAKE 1 TABLET BY MOUTH 3 TIMES DAILY AS NEEDED FOR PAIN Patient not taking: Reported on 2014-05-07 09/25/13   [name], MD  triamcinolone ointment (KENALOG) 0.5 % Apply 1 application topically 2 (two) times daily. Use as directed Patient not taking: Reported on 2014-05-07 12/30/13   [name], MD   BP 136/80 mmHg  Pulse 137  Temp(Src) 98.2 F (36.8 C) (Oral)  Resp 27  SpO2 97% Physical Exam  Constitutional: She is oriented to person, place, and time. She appears well-developed and well-nourished. No distress.  Tearful  HENT:  Head: Normocephalic and atraumatic.  Mouth/Throat: Oropharynx is clear and moist.  Eyes: Conjunctivae are normal. Pupils are equal, round, and reactive to light. No scleral icterus.  Neck: Neck supple.  Cardiovascular: Regular rhythm, normal heart sounds and intact distal pulses.  Tachycardia present.   No murmur heard. Pulmonary/Chest: Effort normal. No stridor. No respiratory distress. She has decreased breath sounds (righ tbase). She has rales (r base worse than left base).  Abdominal: Soft. Bowel sounds are normal. She exhibits no distension. There is no tenderness.  Musculoskeletal: Normal range of motion.  No back tenderness to palpation  Neurological: She is alert and oriented to person, place, and time.  Skin: Skin is warm and dry. No rash noted.  Psychiatric: She has a normal mood and affect. Her behavior is normal.  Nursing note and vitals reviewed.   ED Course  CRITICAL CARE Performed by: Warnell Forester Authorized by: Warnell Forester Total critical care time: 45 minutes Critical care time was exclusive of separately billable procedures and treating other patients. Critical  care was necessary to treat or prevent imminent or life-threatening deterioration of the following conditions: circulatory failure and respiratory failure. Critical care was time spent personally by me on the following activities: development of treatment plan with patient or surrogate, discussions with consultants, evaluation of patient's response to treatment, examination of patient, obtaining history from patient or surrogate, ordering and performing treatments and interventions, ordering and review of laboratory studies, ordering and review of radiographic studies, pulse oximetry, re-evaluation of patient's condition and review of old charts.   (including critical care time) Labs Review Labs Reviewed  CBC WITH DIFFERENTIAL - Abnormal; Notable for the following:    RBC 3.41 (*)    Hemoglobin 9.3 (*)    HCT 30.0 (*)    RDW 17.2 (*)    All other components within normal limits  COMPREHENSIVE METABOLIC PANEL - Abnormal; Notable for the following:    Potassium 6.2 (*)  Glucose, Bld 126 (*)    BUN 79 (*)    Creatinine, Ser 2.69 (*)    Albumin 3.3 (*)    AST 39 (*)    Alkaline Phosphatase 120 (*)    Total Bilirubin 0.2 (*)    GFR calc non Af Amer 15 (*)    GFR calc Af Amer 17 (*)    Anion gap 16 (*)    All other components within normal limits  URINALYSIS, ROUTINE W REFLEX MICROSCOPIC - Abnormal; Notable for the following:    Leukocytes, UA SMALL (*)    All other components within normal limits  PRO B NATRIURETIC PEPTIDE - Abnormal; Notable for the following:    Pro B Natriuretic peptide (BNP) 15788.0 (*)    All other components within normal limits  URINE MICROSCOPIC-ADD ON - Abnormal; Notable for the following:    Casts HYALINE CASTS (*)    All other components within normal limits  POTASSIUM - Abnormal; Notable for the following:    Potassium 5.7 (*)    All other components within normal limits  D-DIMER, QUANTITATIVE - Abnormal; Notable for the following:    D-Dimer, Quant  3.57 (*)    All other components within normal limits  TROPONIN I  TSH    Imaging Review Ct Chest Wo Contrast  05/02/2014   CLINICAL DATA:  Increasing shortness of breath, elevated heart rate for 2 days, renal insufficiency with known right pleural effusion  EXAM: CT CHEST WITHOUT CONTRAST  TECHNIQUE: Multidetector CT imaging of the chest was performed following the standard protocol without IV contrast.  COMPARISON:  Chest radiograph 05/05/2014, chest CT 04/15/2012  FINDINGS: There is a small right pleural effusion at the right lung base. It shows a thick rim suggesting that it is at least partially loculated. Consolidation in the right lower lobe measuring 6 x 4 cm.  Irregular apical pleural thickening right greater than left, mild bilaterally, stable from 2013. There are multiple bands of well-defined linear opacity throughout the anterior right lower lobe and right middle lobe consistent with discoid atelectasis.  On the left, there is atelectasis or scarring in the inferior lingula and in the lung base. There is a very small left pleural effusion.  There is patchy bilateral ground-glass attenuation. There are increased bilateral perihilar markings. There is mild diffuse interstitial prominence.  There is calcification of the aorta. There are numerous nonpathologic mediastinal lymph nodes. There is heavy coronary arterial calcification and mitral valve calcification. There is also milder aortic valve calcification.  There are no acute musculoskeletal findings. Pre images through the upper abdomen are unremarkable.  IMPRESSION: Small bilateral pleural effusions right larger than left. The right effusion demonstrates an appearance suggesting loculation.  There is moderately extensive consolidation in the right lower lobe. Air bronchograms are seen through much of this area. This could represent atelectasis or pneumonia.  Nonspecific ground-glass attenuation bilaterally possibly indicative of pulmonary  edema. There does appear to be mild interstitial pulmonary edema.   Electronically Signed   By: Esperanza Heir M.D.   On: 05/03/2014 22:01   Dg Chest Port 1 View  05/09/2014   CLINICAL DATA:  Shortness of breath with weakness. Initial encounter.  EXAM: PORTABLE CHEST - 1 VIEW  COMPARISON:  03/26/2014 and 01/12/2014 radiographs.  FINDINGS: 1857 hr. There is mild patient rotation to the right. Chronic loculated right pleural effusion and adjacent parenchymal opacity are stable. The pulmonary vasculature is less distinct with increased interstitial prominence suspicious for mild edema. There is  probable mild left basilar atelectasis. No acute osseous findings evident.  IMPRESSION: Ill-defined pulmonary vasculature with increased interstitial prominence suspicious for mild edema/congestive heart failure. Stable loculated right pleural effusion.   Electronically Signed   By: Roxy HorsemanBill  Veazey M.D.   On: 2014-03-23 19:14  All radiology studies independently viewed by me.      EKG Interpretation   Date/Time:  Sunday April 26 2014 17:30:01 EST Ventricular Rate:  135 PR Interval:  135 QRS Duration: 102 QT Interval:  421 QTC Calculation: 631 R Axis:   -60 Text Interpretation:  Narrow QRS tachycardia Abnormal R-wave progression,  late transition Inferior infarct, old Repolarization abnormality, prob  rate related Prolonged QT interval compared to prior, rate increased  Confirmed by [facility] IncWOFFORD  MD, TREY ([id number]) on 2014-03-23 6:43:24 PM      MDM   Final diagnoses:  Shortness of breath  Pleural effusion  Tachycardia    He 80 year old female with a history of recurrent right pleural effusions who presents with shortness of breath and right back pain. She is also found to be very tachycardic. She states that she's been checking her heart rate at home for the past 2 days and has been intermittent between 70 and 130.  She is on toxic and not septic appearing. She is afebrile and denies fevers at home.   Source of tachycardia is unclear, however it does not appear that she has an active infection. Plan pain medication, as this may be contributing to her tachycardia. (She complains of severe right thoracic back pain). Plan labs, chest x-ray, consider chest CT.  Labs concerning for worsening renal function.  Potassium slightly elevated at 5.7.  She also appears fluid overloaded.  Meanwhile, we were unable to get a CTA PE study.  Due to her tachycardia, shortness of breath, I think she needs to be on a heparin drip until PE can be ruled out with VQ scan.  I discussed her care with Dr. Toniann FailKakrakandy who will admit to the stepdown unit.    Warnell Foresterrey Chikita Dogan, MD 04/27/14 [phone]

## 2014-04-26 NOTE — H&P (Signed)
[facility] History and Physical  [name] AVW:[id number] DOB: 02-16-1925 DOA: 05/06/2014  Referring physician: ER physician. PCP:  Jillian Lopeoss, Alan, MD   Chief Complaint: Shortness of breath.  HPI: [name] is a 80 y.o. female with history of CHF last year measured as 50%, atrial fibrillation, hypertension, hypothyroidism, chronic anemia, chronic kidney disease stage III presents to the ER because of shortness of breath. Patient states that she been short of breath last few weeks but acutely worsened last few days. In addition patient has been experiencing palpitations off and on for last 2-3 days. Denies any chest pain fever chills nausea vomiting abdominal pain or diarrhea. Patient states she has not missed her medications. Patient states that she becomes short of breath with minimal exertion and also has noted increasing swelling in the lower extremities. Her left extremity is more swollen than the right which patient states is the usual case. In the ER patient was found to be tachycardic with heart rates around 140 bpm. EKG shows sinus tachycardia. Labs revealed worsening of the creatinine from her baseline. Patient has been admitted for acute respiratory failure. CT chest is pending to rule out any worsening of her pleural effusion and patient has recurrent pleural effusion usually transudative which has required thoracentesis previously. In addition patient has been complaining of increasing pain in the right upper back around the right scapular area. Patient does have pain around that area previously but patient stated as it has worsened last 2 days.  Review of Systems: As presented in the history of presenting illness, rest negative.  Past Medical History  Diagnosis Date  . Stroke   . Anemia   . Hyperlipidemia   . Vitamin D deficiency   . Hypothyroid   . Pulmonary hypertension   . Chronic respiratory failure with hypoxia   . Blindness of right eye     Macular degeneration   . Macular degeneration   . Hypertensive heart disease 04/16/2012  . CHF (congestive heart failure)   . Hypertension   . UTI (urinary tract infection)   . Arthritis     KNEES   Past Surgical History  Procedure Laterality Date  . Cataract extraction, bilateral     Social History:  reports that she quit smoking about 35 years ago. Her smoking use included Cigarettes. She has a 25 pack-year smoking history. She has never used smokeless tobacco. She reports that she does not drink alcohol or use illicit drugs. Where does patient live home. Can patient participate in ADLs? Yes.  Allergies  Allergen Reactions  . Lisinopril Other (See Comments)    pancreatitis    Family History:  Family History  Problem Relation Age of Onset  . Heart disease Mother   . Heart disease Father   . Heart disease Brother   . Breast cancer Daughter   . Lymphoma Brother       Prior to Admission medications   Medication Sig Start Date End Date Taking? Authorizing Provider  beta carotene w/minerals (OCUVITE) tablet Take 1 tablet by mouth 2 (two) times daily.    Yes Historical Provider, MD  calcium carbonate 1250 MG capsule Take 1,250 mg by mouth 2 (two) times daily with a meal.   Yes Historical Provider, MD  cholecalciferol (VITAMIN D) 1000 UNITS tablet Take 1,000 Units by mouth every morning.    Yes Historical Provider, MD  diltiazem (CARDIZEM CD) 180 MG 24 hr capsule Take 180 mg by mouth daily.   Yes Historical Provider, MD  FeFum-FePoly-FA-B Cmp-C-Biot (FOLIVANE-PLUS) CAPS TAKE 1 CAPSULE BY MOUTH ONCE DAILY WITH DINNER 12/26/13  Yes Reather LittlerAjay Kumar, MD  fluocinonide cream (LIDEX) 0.05 % Apply 1 application topically 2 (two) times daily. To legs   Yes Historical Provider, MD  furosemide (LASIX) 40 MG tablet TAKE 1 TABLET BY MOUTH TWICE DAILY 09/22/13  Yes Reather LittlerAjay Kumar, MD  glucosamine-chondroitin 500-400 MG tablet Take 1 tablet by mouth 2 (two) times daily.    Yes Historical Provider, MD  HYDROcodone-acetaminophen  (NORCO) 10-325 MG per tablet Take 1 tablet by mouth 4 (four) times daily as needed for severe pain.   Yes Historical Provider, MD  levothyroxine (SYNTHROID, LEVOTHROID) 150 MCG tablet TAKE 1 TABLET BY MOUTH EVERY MORNING ON AN EMPTY STOMACH. 09/22/13  Yes Reather LittlerAjay Kumar, MD  Melatonin 3 MG CAPS Take 1 capsule by mouth as needed (sleep).    Yes Historical Provider, MD  meloxicam (MOBIC) 7.5 MG tablet Take 1 tablet by mouth daily.   Yes Historical Provider, MD  Multiple Vitamin (MULITIVITAMIN WITH MINERALS) TABS Take 1 tablet by mouth every morning.    Yes Historical Provider, MD  omeprazole (PRILOSEC) 40 MG capsule TAKE 1 CAPSULE BY MOUTH ONCE DAILY 09/22/13  Yes Reather LittlerAjay Kumar, MD  simvastatin (ZOCOR) 10 MG tablet Take 10 mg by mouth at bedtime.   Yes Historical Provider, MD  spironolactone (ALDACTONE) 25 MG tablet Take 1 tablet (25 mg total) by mouth daily. 03/26/14  Yes Nyoka CowdenMichael B Wert, MD  desoximetasone (TOPICORT) 0.05 % cream APPLY TO AFFECTED AREA(S) ONCE DAILY AS DIRECTED 12/29/13   Reather LittlerAjay Kumar, MD  traMADol (ULTRAM) 50 MG tablet TAKE 1 TABLET BY MOUTH 3 TIMES DAILY AS NEEDED FOR PAIN Patient not taking: Reported on 2013-10-14 09/25/13   Reather LittlerAjay Kumar, MD  triamcinolone ointment (KENALOG) 0.5 % Apply 1 application topically 2 (two) times daily. Use as directed Patient not taking: Reported on 2013-10-14 12/30/13   Reather LittlerAjay Kumar, MD    Physical Exam: Filed Vitals:   April 13, 2014 1842 April 13, 2014 1900 April 13, 2014 1931 April 13, 2014 2112  BP: 136/80 131/72 132/72 122/75  Pulse: 137 138 141 144  Temp: 98.2 F (36.8 C)     TempSrc: Oral     Resp: 26 29 24 17   SpO2: 97% 96% 97% 99%     General:  Well-developed and nourished.  Eyes: Anicteric no pallor.  ENT: No discharge from the ears eyes nose and mouth.  Neck: No JVD appreciated. No mass felt.  Cardiovascular: S1-S2 heard.  Respiratory: No rhonchi or crepitations.  Abdomen: Soft nontender bowel sounds present.  Skin: No obvious rash.  Musculoskeletal:  Bilateral lower extremity edema more on the left side.  Psychiatric: Appears normal.  Neurologic: Alert awake oriented to time place and person. Moves all activities.  Labs on Admission:  Basic Metabolic Panel:  Recent Labs Lab April 13, 2014 1851 April 13, 2014 1954  NA 137  --   K 6.2* 5.7*  CL 96  --   CO2 25  --   GLUCOSE 126*  --   BUN 79*  --   CREATININE 2.69*  --   CALCIUM 9.5  --    Liver Function Tests:  Recent Labs Lab April 13, 2014 1851  AST 39*  ALT 18  ALKPHOS 120*  BILITOT 0.2*  PROT 7.9  ALBUMIN 3.3*   No results for input(s): LIPASE, AMYLASE in the last 168 hours. No results for input(s): AMMONIA in the last 168 hours. CBC:  Recent Labs Lab April 13, 2014 1851  WBC 7.4  NEUTROABS 5.5  HGB  9.3*  HCT 30.0*  MCV 88.0  PLT 378   Cardiac Enzymes:  Recent Labs Lab 05/11/2014 1851  TROPONINI <0.30    BNP (last 3 results)  Recent Labs  12/01/13 1507 03/26/14 1504 05-11-14 1851  PROBNP 98.0 186.0* 15788.0*   CBG: No results for input(s): GLUCAP in the last 168 hours.  Radiological Exams on Admission: Dg Chest Port 1 View  05/11/2014   CLINICAL DATA:  Shortness of breath with weakness. Initial encounter.  EXAM: PORTABLE CHEST - 1 VIEW  COMPARISON:  03/26/2014 and 01/12/2014 radiographs.  FINDINGS: 1857 hr. There is mild patient rotation to the right. Chronic loculated right pleural effusion and adjacent parenchymal opacity are stable. The pulmonary vasculature is less distinct with increased interstitial prominence suspicious for mild edema. There is probable mild left basilar atelectasis. No acute osseous findings evident.  IMPRESSION: Ill-defined pulmonary vasculature with increased interstitial prominence suspicious for mild edema/congestive heart failure. Stable loculated right pleural effusion.   Electronically Signed   By: Roxy Horseman M.D.   On: 2014/05/11 19:14    EKG: Independently reviewed. Sinus tachycardia.  Assessment/Plan Principal Problem:    Acute respiratory failure with hypoxia Active Problems:   Pleural effusion   Chronic diastolic CHF (congestive heart failure)   CKD (chronic kidney disease) stage 3, GFR 30-59 ml/min   Anemia of renal disease   Shortness of breath   Tachycardia   Acute respiratory failure   1. Acute respiratory failure with hypoxia - differentials include decompensated CHF versus pneumonia versus pulmonary embolism. Check BNP and d-dimer. I have ordered 1 dose of Lasix 40 mg IV for now despite patient's creatinine being high as patient has obvious fluid overload. Check CT chest without contrast to make sure there is no worsening pleural effusion or infiltrates. Closely follow intake output and metabolic panel. Patient's last EF measured was in May of this year was 50%. 2. Sinus tachycardia with history of paroxysmal atrial fibrillation - at this time I have ordered 1 dose of Cardizem 10 mg IV bolus and if patient's heart rate tends to remain high may start Cardizem drip since patient has history of atrial fibrillation. Check d-dimer and TSH and Procalcitonin levels. If d-dimer is high will need VQ scan to rule out PE. Patient does have lower extremity edema more on the left side for which I have ordered Dopplers of the lower extremity. Patient takes Cardizem daily which patient has stated she did not miss medications. 3. Acute on chronic renal failure stage 3-4 with mild hyperkalemia - I have ordered Lasix 40 mg IV 1 dose since patient looks obviously fluid overloaded. Based on patient's creatinine levels and respiratory status further dose of Lasix and patient is also on spironolactone has to be decided. Closely follow intake output and metabolic panel. Hold NSAIDs. 4. History of recurrent pleural effusion - CT chest is pending. 5. Chronic anemia - follow CBC. 6. Hyperlipidemia - on statins. 7. Hypothyroidism - check TSH.    Code Status: Full code.  Family Communication: Patient's sister at the bedside.   Disposition Plan: Admit to inpatient.    Danial Hlavac N. [facility] Pager [phone].  If 7PM-7AM, please contact night-coverage [email or web address] Password [email or web address] 2014-05-11, 10:01 PM

## 2014-04-26 NOTE — ED Notes (Signed)
Bed: WA21 Expected date:  Expected time:  Means of arrival:  Comments: EMS- breathing difficulty

## 2014-04-27 ENCOUNTER — Inpatient Hospital Stay (HOSPITAL_COMMUNITY): Payer: Medicare Other

## 2014-04-27 ENCOUNTER — Encounter (HOSPITAL_COMMUNITY): Payer: Self-pay | Admitting: Cardiology

## 2014-04-27 DIAGNOSIS — R0602 Shortness of breath: Secondary | ICD-10-CM

## 2014-04-27 DIAGNOSIS — R609 Edema, unspecified: Secondary | ICD-10-CM

## 2014-04-27 DIAGNOSIS — D631 Anemia in chronic kidney disease: Secondary | ICD-10-CM

## 2014-04-27 DIAGNOSIS — I4892 Unspecified atrial flutter: Secondary | ICD-10-CM

## 2014-04-27 DIAGNOSIS — K819 Cholecystitis, unspecified: Secondary | ICD-10-CM

## 2014-04-27 DIAGNOSIS — R6521 Severe sepsis with septic shock: Secondary | ICD-10-CM

## 2014-04-27 DIAGNOSIS — A419 Sepsis, unspecified organism: Secondary | ICD-10-CM

## 2014-04-27 DIAGNOSIS — I4891 Unspecified atrial fibrillation: Secondary | ICD-10-CM

## 2014-04-27 DIAGNOSIS — I5032 Chronic diastolic (congestive) heart failure: Secondary | ICD-10-CM

## 2014-04-27 LAB — CBC WITH DIFFERENTIAL/PLATELET
Basophils Absolute: 0 10*3/uL (ref 0.0–0.1)
Basophils Relative: 1 % (ref 0–1)
Eosinophils Absolute: 0.2 10*3/uL (ref 0.0–0.7)
Eosinophils Relative: 3 % (ref 0–5)
HEMATOCRIT: 30.6 % — AB (ref 36.0–46.0)
HEMOGLOBIN: 9.4 g/dL — AB (ref 12.0–15.0)
Lymphocytes Relative: 9 % — ABNORMAL LOW (ref 12–46)
Lymphs Abs: 0.7 10*3/uL (ref 0.7–4.0)
MCH: 27.2 pg (ref 26.0–34.0)
MCHC: 30.7 g/dL (ref 30.0–36.0)
MCV: 88.7 fL (ref 78.0–100.0)
MONO ABS: 0.8 10*3/uL (ref 0.1–1.0)
MONOS PCT: 11 % (ref 3–12)
Neutro Abs: 5.4 10*3/uL (ref 1.7–7.7)
Neutrophils Relative %: 76 % (ref 43–77)
Platelets: 359 10*3/uL (ref 150–400)
RBC: 3.45 MIL/uL — ABNORMAL LOW (ref 3.87–5.11)
RDW: 17.2 % — ABNORMAL HIGH (ref 11.5–15.5)
WBC: 7.1 10*3/uL (ref 4.0–10.5)

## 2014-04-27 LAB — COMPREHENSIVE METABOLIC PANEL
ALBUMIN: 3.2 g/dL — AB (ref 3.5–5.2)
ALK PHOS: 114 U/L (ref 39–117)
ALK PHOS: 186 U/L — AB (ref 39–117)
ALT: 14 U/L (ref 0–35)
ALT: 462 U/L — AB (ref 0–35)
AST: 20 U/L (ref 0–37)
AST: 511 U/L — ABNORMAL HIGH (ref 0–37)
Albumin: 2.8 g/dL — ABNORMAL LOW (ref 3.5–5.2)
Anion gap: 14 (ref 5–15)
Anion gap: 18 — ABNORMAL HIGH (ref 5–15)
BUN: 73 mg/dL — ABNORMAL HIGH (ref 6–23)
BUN: 70 mg/dL — ABNORMAL HIGH (ref 6–23)
CO2: 28 mEq/L (ref 19–32)
CO2: 22 mEq/L (ref 19–32)
Calcium: 9.5 mg/dL (ref 8.4–10.5)
Calcium: 8.6 mg/dL (ref 8.4–10.5)
Chloride: 98 mEq/L (ref 96–112)
Chloride: 94 mEq/L — ABNORMAL LOW (ref 96–112)
Creatinine, Ser: 2.57 mg/dL — ABNORMAL HIGH (ref 0.50–1.10)
Creatinine, Ser: 2.84 mg/dL — ABNORMAL HIGH (ref 0.50–1.10)
GFR calc Af Amer: 18 mL/min — ABNORMAL LOW (ref >90)
GFR calc Af Amer: 16 mL/min — ABNORMAL LOW (ref >90)
GFR calc non Af Amer: 16 mL/min — ABNORMAL LOW (ref >90)
GFR calc non Af Amer: 14 mL/min — ABNORMAL LOW (ref >90)
GLUCOSE: 193 mg/dL — AB (ref 70–99)
Glucose, Bld: 157 mg/dL — ABNORMAL HIGH (ref 70–99)
POTASSIUM: 4.6 meq/L (ref 3.7–5.3)
POTASSIUM: 6 meq/L — AB (ref 3.7–5.3)
SODIUM: 134 meq/L — AB (ref 137–147)
Sodium: 140 mEq/L (ref 137–147)
TOTAL PROTEIN: 7.5 g/dL (ref 6.0–8.3)
TOTAL PROTEIN: 7 g/dL (ref 6.0–8.3)
Total Bilirubin: 0.2 mg/dL — ABNORMAL LOW (ref 0.3–1.2)
Total Bilirubin: 0.4 mg/dL (ref 0.3–1.2)

## 2014-04-27 LAB — MRSA PCR SCREENING: MRSA by PCR: NEGATIVE

## 2014-04-27 LAB — PROTIME-INR
INR: 1.02 (ref 0.00–1.49)
PROTHROMBIN TIME: 13.5 s (ref 11.6–15.2)

## 2014-04-27 LAB — CBC
HCT: 29.9 % — ABNORMAL LOW (ref 36.0–46.0)
HEMOGLOBIN: 9.2 g/dL — AB (ref 12.0–15.0)
MCH: 27.5 pg (ref 26.0–34.0)
MCHC: 30.8 g/dL (ref 30.0–36.0)
MCV: 89.3 fL (ref 78.0–100.0)
Platelets: 399 10*3/uL (ref 150–400)
RBC: 3.35 MIL/uL — AB (ref 3.87–5.11)
RDW: 17.4 % — ABNORMAL HIGH (ref 11.5–15.5)
WBC: 9.3 10*3/uL (ref 4.0–10.5)

## 2014-04-27 LAB — BLOOD GAS, ARTERIAL
Acid-base deficit: 4.8 mmol/L — ABNORMAL HIGH (ref 0.0–2.0)
Bicarbonate: 20.8 mEq/L (ref 20.0–24.0)
Drawn by: 232811
O2 Content: 3 L/min
O2 Saturation: 91.3 %
PH ART: 7.31 — AB (ref 7.350–7.450)
PO2 ART: 70.6 mmHg — AB (ref 80.0–100.0)
Patient temperature: 97.6
TCO2: 19.7 mmol/L (ref 0–100)
pCO2 arterial: 42.2 mmHg (ref 35.0–45.0)

## 2014-04-27 LAB — PROCALCITONIN: PROCALCITONIN: 0.35 ng/mL

## 2014-04-27 LAB — APTT: APTT: 32 s (ref 24–37)

## 2014-04-27 LAB — HEPARIN LEVEL (UNFRACTIONATED): HEPARIN UNFRACTIONATED: 0.55 [IU]/mL (ref 0.30–0.70)

## 2014-04-27 LAB — TSH: TSH: 3.35 u[IU]/mL (ref 0.350–4.500)

## 2014-04-27 LAB — SODIUM, URINE, RANDOM: Sodium, Ur: 103 mEq/L

## 2014-04-27 LAB — TROPONIN I

## 2014-04-27 LAB — LACTIC ACID, PLASMA
Lactic Acid, Venous: 1.1 mmol/L (ref 0.5–2.2)
Lactic Acid, Venous: 2.7 mmol/L — ABNORMAL HIGH (ref 0.5–2.2)

## 2014-04-27 MED ORDER — SODIUM CHLORIDE 0.9 % IV BOLUS (SEPSIS)
250.0000 mL | Freq: Once | INTRAVENOUS | Status: AC
  Administered 2014-04-27: 250 mL via INTRAVENOUS

## 2014-04-27 MED ORDER — SIMVASTATIN 10 MG PO TABS
10.0000 mg | ORAL_TABLET | Freq: Every day | ORAL | Status: DC
  Filled 2014-04-27: qty 1

## 2014-04-27 MED ORDER — AMIODARONE LOAD VIA INFUSION: 150.0000 mg | Freq: Once | INTRAVENOUS | Status: DC

## 2014-04-27 MED ORDER — MELATONIN 3 MG PO CAPS: 1.0000 | ORAL_CAPSULE | ORAL | Status: DC | PRN

## 2014-04-27 MED ORDER — TRIAMCINOLONE ACETONIDE 0.5 % EX OINT
1.0000 "application " | TOPICAL_OINTMENT | Freq: Two times a day (BID) | CUTANEOUS | Status: DC
  Administered 2014-04-27: 1 via TOPICAL
  Filled 2014-04-27: qty 15

## 2014-04-27 MED ORDER — METOPROLOL TARTRATE 1 MG/ML IV SOLN
5.0000 mg | Freq: Once | INTRAVENOUS | Status: AC
  Administered 2014-04-27: 5 mg via INTRAVENOUS
  Filled 2014-04-27: qty 5

## 2014-04-27 MED ORDER — AMIODARONE LOAD VIA INFUSION
150.0000 mg | Freq: Once | INTRAVENOUS | Status: AC
  Administered 2014-04-27: 150 mg via INTRAVENOUS
  Filled 2014-04-27: qty 83.34

## 2014-04-27 MED ORDER — PHENYLEPHRINE HCL 10 MG/ML IJ SOLN: 30.0000 ug/min | INTRAVENOUS | Status: DC

## 2014-04-27 MED ORDER — DEXTROSE 5 % IV SOLN
2.0000 g | Freq: Once | INTRAVENOUS | Status: DC
  Filled 2014-04-27: qty 2

## 2014-04-27 MED ORDER — ACETAMINOPHEN 325 MG PO TABS: 650.0000 mg | ORAL_TABLET | Freq: Four times a day (QID) | ORAL | Status: DC | PRN

## 2014-04-27 MED ORDER — GLUCOSAMINE-CHONDROITIN 500-400 MG PO TABS: 1.0000 | ORAL_TABLET | Freq: Two times a day (BID) | ORAL | Status: DC

## 2014-04-27 MED ORDER — DILTIAZEM HCL ER COATED BEADS 180 MG PO CP24: 180.0000 mg | ORAL_CAPSULE | Freq: Every day | ORAL | Status: DC

## 2014-04-27 MED ORDER — FUROSEMIDE 10 MG/ML IJ SOLN: 40.0000 mg | Freq: Two times a day (BID) | INTRAMUSCULAR | Status: DC

## 2014-04-27 MED ORDER — SODIUM CHLORIDE 0.9 % IJ SOLN
3.0000 mL | Freq: Two times a day (BID) | INTRAMUSCULAR | Status: DC
  Administered 2014-04-27 (×3): 3 mL via INTRAVENOUS

## 2014-04-27 MED ORDER — DILTIAZEM LOAD VIA INFUSION
10.0000 mg | Freq: Once | INTRAVENOUS | Status: AC
  Administered 2014-04-27: 10 mg via INTRAVENOUS
  Filled 2014-04-27: qty 10

## 2014-04-27 MED ORDER — CEFTRIAXONE SODIUM 1 G IJ SOLR
1.0000 g | Freq: Every day | INTRAMUSCULAR | Status: DC
  Administered 2014-04-27: 1 g via INTRAVENOUS
  Filled 2014-04-27: qty 10

## 2014-04-27 MED ORDER — VANCOMYCIN HCL IN DEXTROSE 1-5 GM/200ML-% IV SOLN
1000.0000 mg | INTRAVENOUS | Status: DC
  Administered 2014-04-27: 1000 mg via INTRAVENOUS
  Filled 2014-04-27: qty 200

## 2014-04-27 MED ORDER — ONDANSETRON HCL 4 MG/2ML IJ SOLN
4.0000 mg | Freq: Four times a day (QID) | INTRAMUSCULAR | Status: DC | PRN
  Administered 2014-04-27: 4 mg via INTRAVENOUS
  Filled 2014-04-27: qty 2

## 2014-04-27 MED ORDER — AMIODARONE HCL IN DEXTROSE 360-4.14 MG/200ML-% IV SOLN
60.0000 mg/h | INTRAVENOUS | Status: AC
  Administered 2014-04-27 (×2): 60 mg/h via INTRAVENOUS
  Filled 2014-04-27 (×2): qty 200

## 2014-04-27 MED ORDER — HEPARIN SODIUM (PORCINE) 5000 UNIT/ML IJ SOLN
5000.0000 [IU] | Freq: Three times a day (TID) | INTRAMUSCULAR | Status: DC
  Administered 2014-04-27 (×2): 5000 [IU] via SUBCUTANEOUS
  Filled 2014-04-27 (×2): qty 1

## 2014-04-27 MED ORDER — SODIUM CHLORIDE 0.9 % IJ SOLN
10.0000 mL | Freq: Two times a day (BID) | INTRAMUSCULAR | Status: DC
  Administered 2014-04-27: 10 mL

## 2014-04-27 MED ORDER — SODIUM CHLORIDE 0.9 % IJ SOLN: 10.0000 mL | INTRAMUSCULAR | Status: DC | PRN

## 2014-04-27 MED ORDER — DEXTROSE 5 % IV SOLN
500.0000 mg | INTRAVENOUS | Status: DC
  Administered 2014-04-27: 500 mg via INTRAVENOUS
  Filled 2014-04-27: qty 500

## 2014-04-27 MED ORDER — HYDROCODONE-ACETAMINOPHEN 10-325 MG PO TABS
1.0000 | ORAL_TABLET | Freq: Four times a day (QID) | ORAL | Status: DC | PRN
  Administered 2014-04-27 (×2): 1 via ORAL
  Filled 2014-04-27 (×2): qty 1

## 2014-04-27 MED ORDER — AMIODARONE HCL IN DEXTROSE 360-4.14 MG/200ML-% IV SOLN: 30.0000 mg/h | INTRAVENOUS | Status: DC

## 2014-04-27 MED ORDER — FENTANYL CITRATE 0.05 MG/ML IJ SOLN
25.0000 ug | INTRAMUSCULAR | Status: DC | PRN
  Filled 2014-04-27: qty 2

## 2014-04-27 MED ORDER — PANTOPRAZOLE SODIUM 40 MG PO TBEC
40.0000 mg | DELAYED_RELEASE_TABLET | Freq: Every day | ORAL | Status: DC
  Administered 2014-04-27: 40 mg via ORAL
  Filled 2014-04-27: qty 1

## 2014-04-27 MED ORDER — HEPARIN (PORCINE) IN NACL 100-0.45 UNIT/ML-% IJ SOLN
950.0000 [IU]/h | INTRAMUSCULAR | Status: DC
  Administered 2014-04-27: 950 [IU]/h via INTRAVENOUS
  Filled 2014-04-27: qty 250

## 2014-04-27 MED ORDER — HEPARIN SODIUM (PORCINE) 5000 UNIT/ML IJ SOLN: 5000.0000 [IU] | Freq: Three times a day (TID) | INTRAMUSCULAR | Status: DC

## 2014-04-27 MED ORDER — ACETAMINOPHEN 650 MG RE SUPP: 650.0000 mg | Freq: Four times a day (QID) | RECTAL | Status: DC | PRN

## 2014-04-27 MED ORDER — TECHNETIUM TC 99M DIETHYLENETRIAME-PENTAACETIC ACID: 44.0000 | Freq: Once | INTRAVENOUS | Status: AC | PRN

## 2014-04-27 MED ORDER — SODIUM CHLORIDE 0.9 % IV BOLUS (SEPSIS)
1000.0000 mL | Freq: Once | INTRAVENOUS | Status: AC
  Administered 2014-04-27: 1000 mL via INTRAVENOUS

## 2014-04-27 MED ORDER — PHENYLEPHRINE HCL 10 MG/ML IJ SOLN
0.0000 ug/min | INTRAMUSCULAR | Status: DC
  Administered 2014-04-27: 40 ug/min via INTRAVENOUS
  Administered 2014-04-27: 50 ug/min via INTRAVENOUS
  Administered 2014-04-27: 20 ug/min via INTRAVENOUS
  Filled 2014-04-27 (×4): qty 1

## 2014-04-27 MED ORDER — SENNOSIDES-DOCUSATE SODIUM 8.6-50 MG PO TABS
2.0000 | ORAL_TABLET | Freq: Every day | ORAL | Status: DC
  Filled 2014-04-27: qty 2

## 2014-04-27 MED ORDER — ONDANSETRON HCL 4 MG PO TABS: 4.0000 mg | ORAL_TABLET | Freq: Four times a day (QID) | ORAL | Status: DC | PRN

## 2014-04-27 MED ORDER — AMIODARONE HCL IN DEXTROSE 360-4.14 MG/200ML-% IV SOLN
30.0000 mg/h | INTRAVENOUS | Status: DC
  Administered 2014-04-27 – 2014-04-28 (×2): 30 mg/h via INTRAVENOUS
  Filled 2014-04-27: qty 200

## 2014-04-27 MED ORDER — AMIODARONE HCL IN DEXTROSE 360-4.14 MG/200ML-% IV SOLN: 60.0000 mg/h | INTRAVENOUS | Status: DC

## 2014-04-27 MED ORDER — DILTIAZEM HCL 100 MG IV SOLR
5.0000 mg/h | Freq: Once | INTRAVENOUS | Status: AC
  Administered 2014-04-27: 5 mg/h via INTRAVENOUS
  Filled 2014-04-27: qty 100

## 2014-04-27 MED ORDER — DILTIAZEM HCL 100 MG IV SOLR
5.0000 mg/h | INTRAVENOUS | Status: DC
  Administered 2014-04-27: 5 mg/h via INTRAVENOUS
  Filled 2014-04-27: qty 100

## 2014-04-27 MED ORDER — HYDROCORTISONE NA SUCCINATE PF 100 MG IJ SOLR
50.0000 mg | Freq: Four times a day (QID) | INTRAMUSCULAR | Status: DC
  Administered 2014-04-27 – 2014-04-28 (×4): 50 mg via INTRAVENOUS
  Filled 2014-04-27 (×4): qty 2

## 2014-04-27 MED ORDER — SPIRONOLACTONE 25 MG PO TABS: 25.0000 mg | ORAL_TABLET | Freq: Every day | ORAL | Status: DC

## 2014-04-27 MED ORDER — DEXTROSE 5 % IV SOLN: 1.0000 g | INTRAVENOUS | Status: DC

## 2014-04-27 MED ORDER — LEVOTHYROXINE SODIUM 75 MCG PO TABS
150.0000 ug | ORAL_TABLET | Freq: Every day | ORAL | Status: DC
  Administered 2014-04-27 – 2014-04-28 (×2): 150 ug via ORAL
  Filled 2014-04-27: qty 2
  Filled 2014-04-27 (×2): qty 1

## 2014-04-27 MED ORDER — TECHNETIUM TO 99M ALBUMIN AGGREGATED: 5.1000 | Freq: Once | INTRAVENOUS | Status: AC | PRN

## 2014-04-27 MED ORDER — FENTANYL CITRATE 0.05 MG/ML IJ SOLN
12.5000 ug | Freq: Once | INTRAMUSCULAR | Status: AC
  Administered 2014-04-27: 25 ug via INTRAVENOUS
  Filled 2014-04-27: qty 2

## 2014-04-27 MED ORDER — PIPERACILLIN-TAZOBACTAM IN DEX 2-0.25 GM/50ML IV SOLN
2.2500 g | Freq: Three times a day (TID) | INTRAVENOUS | Status: DC
  Administered 2014-04-27 – 2014-04-28 (×3): 2.25 g via INTRAVENOUS
  Filled 2014-04-27 (×4): qty 50

## 2014-04-27 MED ORDER — HEPARIN BOLUS VIA INFUSION
2000.0000 [IU] | Freq: Once | INTRAVENOUS | Status: AC
  Administered 2014-04-27: 2000 [IU] via INTRAVENOUS
  Filled 2014-04-27: qty 2000

## 2014-04-27 MED ORDER — TRAMADOL HCL 50 MG PO TABS
25.0000 mg | ORAL_TABLET | Freq: Four times a day (QID) | ORAL | Status: DC | PRN
  Administered 2014-04-27: 25 mg via ORAL
  Filled 2014-04-27: qty 1

## 2014-04-27 NOTE — Consult Note (Signed)
Cardiology Consult Note  Admit date: 04/23/2014 Name: [name] 80 y.o.  female DOB:  [birth year] MRN:  [id number]  Today's date:  04/27/2014  Referring Physician:    [facility]  Primary Physician:   Dr. Lorella Nimrod  Reason for Consultation:   Atrial flutter  IMPRESSIONS: 1.  Atrial flutter in a patient with previous cardiac arrhythmias, likely contributing to her hypotension and decompensation. 2.  Right lower lobe pneumonia. 3.  Acute on chronic diastolic congestive heart failure. 4.  Combined mitral stenosis, mitral regurgitation, not candidate for surgery. 5.  Chronic right pleural effusion. 6.  Prior history of atrial fibrillation with prior history of GI bleeding on anticoagulation. 7.  Prior use of amiodarone. 8.  Significant chronic back pain 9.  Stage IV chronic kidney disease with worsening 10.  Chronic anemia  RECOMMENDATION: This is a difficult situation.  She has had atrial fibrillation and was on amiodarone in the past but this was stopped by pulmonary.  She has had anticoagulation in the past as she has a high CHADS2VASC score of 6, but this was stopped because of gastrointestinal bleeding.  She also has worsening renal insufficiency.  This makes things difficult to manage.  I will check her echocardiogram and in the meantime, would initiate amiodarone intravenously.  Digoxin may also help control her rate.  Anticoagulation is can be difficult because of a prior history of GI bleeding.  She might do better in sinus rhythm in terms of rate control, but let us see how she does in response to the amiodarone.    HISTORY: This 80 year old female is well-known to me and has a history of recurrent right pleural effusions and every respiratory distress.  She has been on oxygen at home and had multiple admissions with chronic diastolic heart failure.  At one point she was having paroxysmal atrial fibrillation and was anticoagulated and on amiodarone but developed GI  bleeding in the anticoagulation had to be stopped.  In addition, the pulmonologist were concerned about the amiodarone and this was later stopped.  She had been getting along fairly well at home without any recent admissions, but had some re-tap of the pleural effusion.  This summer and had some worsening dyspnea recently.  She was in sinus rhythm for a long time.  Her daughter died about 3 weeks ago and prior to that she was having some worsening shortness of breath with her Lasix having been increased by the pulmonary physician recently.  After her daughter's death.  She has had worsening shortness of breath and came to the emergency room yesterday with increasing shortness of breath, was found to be in rapid atrial flutter with 2 to one block.  She had some hypotension on diltiazem and this was stopped and she was placed on phenylephrine.  She does not have any chest pain and her cardiac enzymes have been negative.  She also has significant anxiety and has significant chronic back pain.   Past Medical History  Diagnosis Date  . Stroke   . Anemia   . Hyperlipidemia   . Vitamin D deficiency   . Hypothyroid   . Pulmonary hypertension   . Chronic respiratory failure with hypoxia   . Blindness of right eye     Macular degeneration  . Macular degeneration   . Hypertensive heart disease   . Chronic diastolic CHF (congestive heart failure)   . Hypertensive heart disease   . UTI (urinary tract infection)   . Osteoarthritis   . Pleural  effusion   . Mitral stenosis and regurgitation   . Chronic kidney disease (CKD), stage IV (severe)   . Hypothyroidism  04/16/2012  . History of CVA (cerebrovascular accident)   . Anemia of renal disease 09/08/2012      Past Surgical History  Procedure Laterality Date  . Cataract extraction, bilateral       Allergies:  is allergic to lisinopril.   Medications: Prior to Admission medications   Medication Sig Start Date End Date Taking? Authorizing Provider   beta carotene w/minerals (OCUVITE) tablet Take 1 tablet by mouth 2 (two) times daily.    Yes Historical Provider, MD  calcium carbonate 1250 MG capsule Take 1,250 mg by mouth 2 (two) times daily with a meal.   Yes Historical Provider, MD  cholecalciferol (VITAMIN D) 1000 UNITS tablet Take 1,000 Units by mouth every morning.    Yes Historical Provider, MD  diltiazem (CARDIZEM CD) 180 MG 24 hr capsule Take 180 mg by mouth daily.   Yes Historical Provider, MD  FeFum-FePoly-FA-B Cmp-C-Biot (FOLIVANE-PLUS) CAPS TAKE 1 CAPSULE BY MOUTH ONCE DAILY WITH DINNER 12/26/13  Yes Reather LittlerAjay Kumar, MD  fluocinonide cream (LIDEX) 0.05 % Apply 1 application topically 2 (two) times daily. To legs   Yes Historical Provider, MD  furosemide (LASIX) 40 MG tablet TAKE 1 TABLET BY MOUTH TWICE DAILY 09/22/13  Yes Reather LittlerAjay Kumar, MD  glucosamine-chondroitin 500-400 MG tablet Take 1 tablet by mouth 2 (two) times daily.    Yes Historical Provider, MD  HYDROcodone-acetaminophen (NORCO) 10-325 MG per tablet Take 1 tablet by mouth 4 (four) times daily as needed for severe pain.   Yes Historical Provider, MD  levothyroxine (SYNTHROID, LEVOTHROID) 150 MCG tablet TAKE 1 TABLET BY MOUTH EVERY MORNING ON AN EMPTY STOMACH. 09/22/13  Yes Reather LittlerAjay Kumar, MD  Melatonin 3 MG CAPS Take 1 capsule by mouth as needed (sleep).    Yes Historical Provider, MD  meloxicam (MOBIC) 7.5 MG tablet Take 1 tablet by mouth daily.   Yes Historical Provider, MD  Multiple Vitamin (MULITIVITAMIN WITH MINERALS) TABS Take 1 tablet by mouth every morning.    Yes Historical Provider, MD  omeprazole (PRILOSEC) 40 MG capsule TAKE 1 CAPSULE BY MOUTH ONCE DAILY 09/22/13  Yes Reather LittlerAjay Kumar, MD  simvastatin (ZOCOR) 10 MG tablet Take 10 mg by mouth at bedtime.   Yes Historical Provider, MD  spironolactone (ALDACTONE) 25 MG tablet Take 1 tablet (25 mg total) by mouth daily. 03/26/14  Yes Nyoka CowdenMichael B Wert, MD  desoximetasone (TOPICORT) 0.05 % cream APPLY TO AFFECTED AREA(S) ONCE DAILY AS  DIRECTED 12/29/13   Reather LittlerAjay Kumar, MD  traMADol (ULTRAM) 50 MG tablet TAKE 1 TABLET BY MOUTH 3 TIMES DAILY AS NEEDED FOR PAIN Patient not taking: Reported on 05/10/2014 09/25/13   Reather LittlerAjay Kumar, MD  triamcinolone ointment (KENALOG) 0.5 % Apply 1 application topically 2 (two) times daily. Use as directed Patient not taking: Reported on 04/24/2014 12/30/13   Reather LittlerAjay Kumar, MD    Family History: Family Status  Relation Status Death Age  . Mother Deceased     died of MI  . Father Deceased [id number]    heart disease  . Brother Deceased     died of MI    Social History:   reports that she quit smoking about 35 years ago. Her smoking use included Cigarettes. She has a 25 pack-year smoking history. She has never used smokeless tobacco. She reports that she does not drink alcohol or use illicit drugs.   History  Social History Narrative    Review of Systems: Significant chronic back pain.  She has history of macular degeneration and also has blindness in her right eye previously.  She has a prior history of GI bleeding and had some worsening urinary frequency.  She does have significant arthritis.  Other than as noted above the remainder of the review of systems is unremarkable.  Physical Exam: BP 57/28 mmHg  Pulse 65  Temp(Src) 97.6 F (36.4 C) (Oral)  Resp 22  Ht 5\' 5"  (1.651 m)  Wt 70.9 kg (156 lb 4.9 oz)  BMI 26.01 kg/m2  SpO2 99%  General appearance: Elderly pleasant female currently in no acute distress Head: Normocephalic, without obvious abnormality, atraumatic Eyes: The right eye is opaque, the left eye is clear, the pupil is reactive in this eye and extraocular movements are intact there. Neck: no adenopathy, no carotid bruit, supple, symmetrical, trachea midline and JVD not increased Lungs: Reduced breath sounds present at the right base. Heart: Rapid rhythm, normal S1, S2, 2/6 systolic murmur at apex Abdomen: soft, non-tender; bowel sounds normal; no masses,  no organomegaly Pelvic:  deferred Extremities: 1+ edema noted Pulses: 2+ and symmetric Skin: Skin color, texture, turgor normal. No rashes or lesions Neurologic: Grossly normal  Labs: CBC  Recent Labs  04/27/14 0333  WBC 7.1  RBC 3.45*  HGB 9.4*  HCT 30.6*  PLT 359  MCV 88.7  MCH 27.2  MCHC 30.7  RDW 17.2*  LYMPHSABS 0.7  MONOABS 0.8  EOSABS 0.2  BASOSABS 0.0   CMP   Recent Labs  04/27/14 0333  NA 140  K 4.6  CL 98  CO2 28  GLUCOSE 157*  BUN 73*  CREATININE 2.57*  CALCIUM 9.5  PROT 7.5  ALBUMIN 3.2*  AST 20  ALT 14  ALKPHOS 114  BILITOT 0.2*  GFRNONAA 16*  GFRAA 18*   BNP (last 3 results)  Recent Labs  12/01/13 1507 03/26/14 1504 05/10/2014 1851  PROBNP 98.0 186.0* 15788.0*   Cardiac Panel (last 3 results)  Recent Labs  04/27/14 0142 04/27/14 0650 04/27/14 1216  TROPONINI <0.30 <0.30 <0.30     Radiology: Mild interstitial congestion as well as loculated right pleural effusion, rotated view  EKG: Typical atrial flutter with rapid response and 2-1 conduction  Signed:  W. Ashley RoyaltySpencer Tilley, Jr. MD [facility]ervilleFACC   Cardiology Consultant  04/27/2014, 5:04 PM

## 2014-04-27 NOTE — Progress Notes (Signed)
Patient's HR continues to be in the 130s, last BP was 68/39 MAP 47. Patient having pain in her back. Text-paged MD to make aware of current status. MD advised to stop Cardizem, 250 mL bolus and to begin Amiodarone.

## 2014-04-27 NOTE — Progress Notes (Signed)
ANTIBIOTIC CONSULT NOTE - INITIAL  Pharmacy Consult for ceftriaxone Indication: CAP  Allergies  Allergen Reactions  . Lisinopril Other (See Comments)    pancreatitis    Patient Measurements: Height: 5\' 3"  (160 cm) Weight: 165 lb (74.844 kg) IBW/kg (Calculated) : 52.4 Adjusted Body Weight:   Vital Signs: Temp: 97.5 F (36.4 C) (12/13 2344) Temp Source: Oral (12/13 2344) BP: 100/56 mmHg (12/14 0100) Pulse Rate: 146 (12/14 0100) Intake/Output from previous day:   Intake/Output from this shift:    Labs:  Recent Labs  2013/06/12 1851  WBC 7.4  HGB 9.3*  PLT 378  CREATININE 2.69*   Estimated Creatinine Clearance: 14.3 mL/min (by C-G formula based on Cr of 2.69). No results for input(s): VANCOTROUGH, VANCOPEAK, VANCORANDOM, GENTTROUGH, GENTPEAK, GENTRANDOM, TOBRATROUGH, TOBRAPEAK, TOBRARND, AMIKACINPEAK, AMIKACINTROU, AMIKACIN in the last 72 hours.   Microbiology: No results found for this or any previous visit (from the past 720 hour(s)).  Medical History: Past Medical History  Diagnosis Date  . Stroke   . Anemia   . Hyperlipidemia   . Vitamin D deficiency   . Hypothyroid   . Pulmonary hypertension   . Chronic respiratory failure with hypoxia   . Blindness of right eye     Macular degeneration  . Macular degeneration   . Hypertensive heart disease 04/16/2012  . CHF (congestive heart failure)   . Hypertension   . UTI (urinary tract infection)   . Arthritis     KNEES    Medications:  Anti-infectives    Start     Dose/Rate Route Frequency Ordered Stop   04/27/14 0215  cefTRIAXone (ROCEPHIN) 1 g in dextrose 5 % 50 mL IVPB     1 g100 mL/hr over 30 Minutes Intravenous Daily at bedtime 04/27/14 0212     04/27/14 0200  azithromycin (ZITHROMAX) 500 mg in dextrose 5 % 250 mL IVPB     500 mg250 mL/hr over 60 Minutes Intravenous Every 24 hours 04/27/14 0154       Assessment: Patient with CAP.   Goal of Therapy:  Rocephin based on manufacturer dosing  recommendations.   Plan: Ceftriaxone 1gm iv q24hr   Darlina GuysGrimsley Jr, Jacquenette ShoneJulian Crowford 04/27/2014,2:45 AM

## 2014-04-27 NOTE — Progress Notes (Signed)
ANTIBIOTIC CONSULT NOTE - INITIAL  See previous note from Greer PickerelJigna Gadhia PharmD for full details.  Pharmacy is now consulted to dc cefepime and dose Zosyn for intra-abdominal infection.  Plan:  Zosyn 2.25g IV q8h for CrCl > 20 ml/min Follow up renal function & cultures  Loralee PacasErin Yuki Purves, PharmD, BCPS Pager: [phone] 04/27/2014 3:21 PM

## 2014-04-27 NOTE — Progress Notes (Addendum)
Patient with increased heart rate into 130s despite cardizem, hypotesive without much room to go on her drip. Cardiology already consulted, consult pending - Will start Amiodarone and discontinue cardizem given hypotension.  - PCCM consulted, appreciate input.    Costin M. Elvera LennoxGherghe, MD [facility] [phone]  Addendum: Patient received Norco for back pain, now heart rate better, hold Amiodarone until cardiology evaluation. Also with elevated QTc.

## 2014-04-27 NOTE — Consult Note (Addendum)
PULMONARY / CRITICAL CARE MEDICINE   Name: [name] MRN: [id number] DOB: 09/13/1924    ADMISSION DATE:  May 22, 2014 CONSULTATION DATE:  04/27/14  REFERRING MD :  Dr. Elvera Lennox   CHIEF COMPLAINT:  Hypotension   INITIAL PRESENTATION: 80 y/o F admitted 12/13 with SOB.  Found to have a loculated R pleural effusion.  She further developed hypotension on 12/14 and PCCM consulted for evaluation.   STUDIES:  12/13  CT Chest >> small bilateral pleural effusions R>L, R appears loculated, extensive consolidation in RLL with air bronchograms, mild interstitial edema  12/14  VQ Scan >> low probability for PE 12/14  LE Doppler >> prelim neg for DVT >>   SIGNIFICANT EVENTS: 12/13  Admit with SOB, found to have loculated R pleural effusion    HISTORY OF PRESENT ILLNESS:  80 y/o F with a PMH of hyperlipidemia, hypothyroidism, anemia, macular degeneration, hypertension, chronic kidney disease (baseline creatinine ~1.7), congestive heart failure (EF 50% in 08/2012), paroxysmal atrial fibrillation, recurrent pleural effusion (historically transudative), pulmonary hypertension with chronic respiratory failure and hypoxemia who was admitted 12/13 with SOB. On admission the patient reported she had been experiencing worsening shortness of breath and palpitations for approximately 2-3 days. She reported medication compliance.  However she had noted swelling of the lower extremities and decreased activity tolerance. Initial evaluation noted her to have sinus tachycardia in the 140s & acute kidney injury.  Initial chest x-ray showed a stable loculated right-sided pleural effusion & increased interstitial prominence. She was admitted per [facility] to the stepdown unit. She was treated with Lasix & Cardizem.  Troponins were cycled and negative. TSH 3.350 and proBNP 15,788. CT of the chest was assessed to review pleural effusion which demonstrated small bilateral effusions right greater than left with the right  appearing loculated. Right lower lobe was notable for extensive consolidation with air bronchograms. VQ scan was further evaluated to rule out pulmonary embolism which was consistent with low probability for PE. Lower extremity Dopplers were negative on preliminary reading. On 12/14, the patient developed hypotension and PCCM consulted for evaluation.    Of note, her daughter passed away approximately 3 weeks prior to her admission at [facility].     PAST MEDICAL HISTORY :   has a past medical history of Stroke; Anemia; Hyperlipidemia; Vitamin D deficiency; Hypothyroid; Pulmonary hypertension; Chronic respiratory failure with hypoxia; Blindness of right eye; Macular degeneration; Hypertensive heart disease (04/16/2012); CHF (congestive heart failure); Hypertension; UTI (urinary tract infection); and Arthritis.  has past surgical history that includes Cataract extraction, bilateral.    Prior to Admission medications   Medication Sig Start Date End Date Taking? Authorizing Provider  beta carotene w/minerals (OCUVITE) tablet Take 1 tablet by mouth 2 (two) times daily.    Yes Historical Provider, MD  calcium carbonate 1250 MG capsule Take 1,250 mg by mouth 2 (two) times daily with a meal.   Yes Historical Provider, MD  cholecalciferol (VITAMIN D) 1000 UNITS tablet Take 1,000 Units by mouth every morning.    Yes Historical Provider, MD  diltiazem (CARDIZEM CD) 180 MG 24 hr capsule Take 180 mg by mouth daily.   Yes Historical Provider, MD  FeFum-FePoly-FA-B Cmp-C-Biot (FOLIVANE-PLUS) CAPS TAKE 1 CAPSULE BY MOUTH ONCE DAILY WITH DINNER 12/26/13  Yes [name], MD  fluocinonide cream (LIDEX) 0.05 % Apply 1 application topically 2 (two) times daily. To legs   Yes Historical Provider, MD  furosemide (LASIX) 40 MG tablet TAKE 1 TABLET BY MOUTH TWICE DAILY  09/22/13  Yes Reather LittlerAjay Kumar, MD  glucosamine-chondroitin 500-400 MG tablet Take 1 tablet by mouth 2 (two) times daily.    Yes Historical Provider, MD   HYDROcodone-acetaminophen (NORCO) 10-325 MG per tablet Take 1 tablet by mouth 4 (four) times daily as needed for severe pain.   Yes Historical Provider, MD  levothyroxine (SYNTHROID, LEVOTHROID) 150 MCG tablet TAKE 1 TABLET BY MOUTH EVERY MORNING ON AN EMPTY STOMACH. 09/22/13  Yes Reather LittlerAjay Kumar, MD  Melatonin 3 MG CAPS Take 1 capsule by mouth as needed (sleep).    Yes Historical Provider, MD  meloxicam (MOBIC) 7.5 MG tablet Take 1 tablet by mouth daily.   Yes Historical Provider, MD  Multiple Vitamin (MULITIVITAMIN WITH MINERALS) TABS Take 1 tablet by mouth every morning.    Yes Historical Provider, MD  omeprazole (PRILOSEC) 40 MG capsule TAKE 1 CAPSULE BY MOUTH ONCE DAILY 09/22/13  Yes Reather LittlerAjay Kumar, MD  simvastatin (ZOCOR) 10 MG tablet Take 10 mg by mouth at bedtime.   Yes Historical Provider, MD  spironolactone (ALDACTONE) 25 MG tablet Take 1 tablet (25 mg total) by mouth daily. 03/26/14  Yes Nyoka CowdenMichael B Wert, MD  desoximetasone (TOPICORT) 0.05 % cream APPLY TO AFFECTED AREA(S) ONCE DAILY AS DIRECTED 12/29/13   Reather LittlerAjay Kumar, MD  traMADol (ULTRAM) 50 MG tablet TAKE 1 TABLET BY MOUTH 3 TIMES DAILY AS NEEDED FOR PAIN Patient not taking: Reported on 04/19/2014 09/25/13   Reather LittlerAjay Kumar, MD  triamcinolone ointment (KENALOG) 0.5 % Apply 1 application topically 2 (two) times daily. Use as directed Patient not taking: Reported on 04/23/2014 12/30/13   Reather LittlerAjay Kumar, MD   Allergies  Allergen Reactions  . Lisinopril Other (See Comments)    pancreatitis    FAMILY HISTORY:  indicated that her mother is deceased. She indicated that her father is deceased. She indicated that her brother is deceased.    SOCIAL HISTORY:  reports that she quit smoking about 35 years ago. Her smoking use included Cigarettes. She has a 25 pack-year smoking history. She has never used smokeless tobacco. She reports that she does not drink alcohol or use illicit drugs.  REVIEW OF SYSTEMS:  Unable to complete with patient as she is  hypotensive.  SUBJECTIVE:   VITAL SIGNS: Temp:  [97.3 F (36.3 C)-98.2 F (36.8 C)] 97.3 F (36.3 C) (12/14 1200) Pulse Rate:  [36-147] 71 (12/14 0900) Resp:  [12-29] 12 (12/14 0900) BP: (78-136)/(32-80) 87/41 mmHg (12/14 0900) SpO2:  [90 %-100 %] 100 % (12/14 0900) Weight:  [156 lb 4.9 oz (70.9 kg)-165 lb (74.844 kg)] 156 lb 4.9 oz (70.9 kg) (12/14 0225)   HEMODYNAMICS:     VENTILATOR SETTINGS:     INTAKE / OUTPUT:  Intake/Output Summary (Last 24 hours) at 04/27/14 1325 Last data filed at 04/27/14 0800  Gross per 24 hour  Intake 630.38 ml  Output    310 ml  Net 320.38 ml    PHYSICAL EXAMINATION: General:  Frail elderly female in no acute distress Neuro:  Drowsy but awakens to voice, speech clear and generalized weakness HEENT:  Mucous membranes pink and dry Cardiovascular:  S1-S2 irregular, atrial flutter in the 80s on monitor Lungs:  Respirations even and nonlabored, lungs bilaterally distant but clear Abdomen:  Tender to palpation on right side, abdomen soft, bowel sounds 4 active Musculoskeletal:  No acute deformities Skin:  Pink and dry, minimal lower extremity edema  LABS:  CBC  Recent Labs Lab 04/22/2014 1851 04/27/14 0333  WBC 7.4 7.1  HGB 9.3*  9.4*  HCT 30.0* 30.6*  PLT 378 359   Coag's  Recent Labs Lab 04/27/14 0142  APTT 32  INR 1.02   BMET  Recent Labs Lab 04/27/2014 1851 04/21/2014 1954 04/27/14 0333  NA 137  --  140  K 6.2* 5.7* 4.6  CL 96  --  98  CO2 25  --  28  BUN 79*  --  73*  CREATININE 2.69*  --  2.57*  GLUCOSE 126*  --  157*   Electrolytes  Recent Labs Lab 04/30/2014 1851 04/27/14 0333  CALCIUM 9.5 9.5   Sepsis Markers  Recent Labs Lab 04/27/14 0142 04/27/14 0650  LATICACIDVEN  --  1.1  PROCALCITON 0.35  --    ABG No results for input(s): PHART, PCO2ART, PO2ART in the last 168 hours.   Liver Enzymes  Recent Labs Lab 05/03/2014 1851 04/27/14 0333  AST 39* 20  ALT 18 14  ALKPHOS 120* 114  BILITOT 0.2*  0.2*  ALBUMIN 3.3* 3.2*   Cardiac Enzymes  Recent Labs Lab 05/02/2014 1851 04/27/14 0142 04/27/14 0650 04/27/14 1216  TROPONINI <0.30 <0.30 <0.30 <0.30  PROBNP 15788.0*  --   --   --    Glucose No results for input(s): GLUCAP in the last 168 hours.  Imaging Ct Chest Wo Contrast  05/02/2014   CLINICAL DATA:  Increasing shortness of breath, elevated heart rate for 2 days, renal insufficiency with known right pleural effusion  EXAM: CT CHEST WITHOUT CONTRAST  TECHNIQUE: Multidetector CT imaging of the chest was performed following the standard protocol without IV contrast.  COMPARISON:  Chest radiograph 04/29/2014, chest CT 04/15/2012  FINDINGS: There is a small right pleural effusion at the right lung base. It shows a thick rim suggesting that it is at least partially loculated. Consolidation in the right lower lobe measuring 6 x 4 cm.  Irregular apical pleural thickening right greater than left, mild bilaterally, stable from 2013. There are multiple bands of well-defined linear opacity throughout the anterior right lower lobe and right middle lobe consistent with discoid atelectasis.  On the left, there is atelectasis or scarring in the inferior lingula and in the lung base. There is a very small left pleural effusion.  There is patchy bilateral ground-glass attenuation. There are increased bilateral perihilar markings. There is mild diffuse interstitial prominence.  There is calcification of the aorta. There are numerous nonpathologic mediastinal lymph nodes. There is heavy coronary arterial calcification and mitral valve calcification. There is also milder aortic valve calcification.  There are no acute musculoskeletal findings. Pre images through the upper abdomen are unremarkable.  IMPRESSION: Small bilateral pleural effusions right larger than left. The right effusion demonstrates an appearance suggesting loculation.  There is moderately extensive consolidation in the right lower lobe. Air  bronchograms are seen through much of this area. This could represent atelectasis or pneumonia.  Nonspecific ground-glass attenuation bilaterally possibly indicative of pulmonary edema. There does appear to be mild interstitial pulmonary edema.   Electronically Signed   By: Esperanza Heir M.D.   On: 05/07/2014 22:01   Dg Chest Port 1 View  05/01/2014   CLINICAL DATA:  Shortness of breath with weakness. Initial encounter.  EXAM: PORTABLE CHEST - 1 VIEW  COMPARISON:  03/26/2014 and 01/12/2014 radiographs.  FINDINGS: 1857 hr. There is mild patient rotation to the right. Chronic loculated right pleural effusion and adjacent parenchymal opacity are stable. The pulmonary vasculature is less distinct with increased interstitial prominence suspicious for mild edema. There is probable  mild left basilar atelectasis. No acute osseous findings evident.  IMPRESSION: Ill-defined pulmonary vasculature with increased interstitial prominence suspicious for mild edema/congestive heart failure. Stable loculated right pleural effusion.   Electronically Signed   By: Roxy HorsemanBill  Veazey M.D.   On: 2013/09/19 19:14     ASSESSMENT / PLAN:  PULMONARY OETT A: Loculated right-sided effusion - s/p repeat thora's (last ~ 5 months ago) Probable right PNA Small left effusion Chronic respiratory failure with hypoxemia Pulmonary hypertension P:   Oxygen to support saturations greater than 92% Pulmonary hygiene as able See ID   CARDIOVASCULAR CVL A:  Hypotension - DDX includes medication effect (cardizem + narc's), pneumonia with sepsis (however lactate negative), infected right pleural space, adrenal insufficiency, possible blood loss with heparin drip (? RP Hemorrhage with abd pain) Atrial Flutter  Hx hypertension - BP runs 95-100 systolic at baseline on BP medications per husband CHF - with acute decompensation, pBNP elevated on admission P:  Neosynephrine gtt for MAP > 65 Assess Cortisol  Stress steroids  Repeat ECHO,  concern for Tak[place]o's given recent death of daughter (3 wks PTA) Cardiology consulted by primary service, consider amio NS bolus x1  Hold anti-hypertensives  RENAL A:   Acute on chronic kidney disease - in the setting of hypotension P:   Trend BMP Avoid nephrotoxic agents Enure adequate renal perfusion  GASTROINTESTINAL A:   R Sided Abdominal Pain  -  Concern for acalculous cholecystitis on US (called by Dr. Lowella DandyHenn) P:   Assess abdominal US >> Assess CT ABD / Pelvis once more stable  Not likely a candidate for surgery NPO x meds / sips  HEMATOLOGIC A:   Chronic Anemia  P:  Trend CBC  INFECTIOUS A:   RLL PNA  R Loculated Effusion  P:   Hold cultures as patient has been on abx x 48 hours  Agree with expansion of antibiotics, d/c cefepime with concern for acalculous cholecystitis. Zosyn for abd coverage.   Abx:  Vanco, start date 12/14, day 2/x Abx: Zosyn, start date 12/14, day 1/x      ENDOCRINE A:   Mild Hyperglycemia  Rule out Adrenal Insufficiency  P:   Assess cortisol Stress steroids Monitor glucose on BMP, if consistently > 180, add SSI   NEUROLOGIC A:   Hx CVA - ? Residual deficits, apparently able to perform all ADL's at home  P:   Monitor mental status, supportive care   FAMILY  - Updates: Updated husband and daughter via phone.  They indicate she had wanted to be on mechanical ventilation if needed for a couple of days but would not want prolonged artifical support.  The husband indicates he does not want "anything that would brutalize her" but the family will talk about it and get back to staff.    - Inter-disciplinary family meet or Palliative Care meeting due by:      Canary BrimBrandi Ollis, NP-C [facility] Pgr: [phone] or [phone]   PCCM ATTENDING: Pt seen on work rounds with care provider noted above. We reviewed pt's initial presentation, consultants notes and hospital database in detail.  The above assessment and plan  was formulated under my direction.  In summary: Somewhat frail elderly woman admitted with dyspnea and developed hypotension while on diltiazem gtt for PAF/flutter with RVR. PCCM asked to see specifically for hypotension. BP better of diltiazem but still low. CXR reveals chronic loculated R effusion and vasc congestion pattern. Doubt PNA. V/Q scan and LE venous duplex scanning do  not support the dx of PE. She has RUQ tenderness on exam and [place] of abd seems to suggest acalculous cholecystitis. This, in conjunction with her rhythm disturbance and the diltiazem probably fully account for the hypotension.   She is not a candidate for surgical intervention @ the present time and perc drainage of the gall bladder should not be required if there is no apparent obstruction of the biliary sytem. Therefore will support with vasopressors and antibiotics.  We have begun discussion with pt's daughter re: limitations of care. She is a very poor candidate for ACLS or mechanical ventilation as the outcome of these interventions in this woman would not be a favorable one. She is not a candidate for hemodialysis for the same reason  We have asked IV team to place PICC as she has poor venous access  Dr Donnie Aho is to see for management of atrial fib. Would consider amiodarone gtt  For now, will leave on [facility] service and depending on how the next 24 hrs go, we cn decide whether she should be transferred to [facility] service   Billy Fischer, MD;  PCCM service; [place] [phone]  04/27/2014, 1:25 PM

## 2014-04-27 NOTE — Progress Notes (Signed)
MD Gherghe advised to hold on orders unless SBP drops to 70s and/or has symptoms. Patient currently in no distress and asleep. Will continue to monitor.

## 2014-04-27 NOTE — Progress Notes (Signed)
*  PRELIMINARY RESULTS* Vascular Ultrasound Lower extremity venous duplex has been completed.  Preliminary findings: no evidence of DVT  Farrel DemarkJill Eunice, RDMS, RVT  04/27/2014, 10:11 AM

## 2014-04-27 NOTE — Progress Notes (Signed)
ANTIBIOTIC CONSULT NOTE - INITIAL  Pharmacy Consult for Cefepime, Vancomycin Indication: Sepsis  Allergies  Allergen Reactions  . Lisinopril Other (See Comments)    pancreatitis    Patient Measurements: Height: 5\' 5"  (165.1 cm) Weight: 156 lb 4.9 oz (70.9 kg) IBW/kg (Calculated) : 57  Vital Signs: Temp: 97.3 F (36.3 C) (12/14 1200) Temp Source: Oral (12/14 1200) BP: 87/41 mmHg (12/14 0900) Pulse Rate: 71 (12/14 0900) Intake/Output from previous day: 12/13 0701 - 12/14 0700 In: 615.9 [P.O.:390; I.V.:225.9] Out: 310 [Urine:310] Intake/Output from this shift: Total I/O In: 14.5 [I.V.:14.5] Out: -   Labs:  Recent Labs  04/27/2014 1851 04/27/14 0333  WBC 7.4 7.1  HGB 9.3* 9.4*  PLT 378 359  CREATININE 2.69* 2.57*   Estimated Creatinine Clearance: 15.2 mL/min (by C-G formula based on Cr of 2.57). No results for input(s): VANCOTROUGH, VANCOPEAK, VANCORANDOM, GENTTROUGH, GENTPEAK, GENTRANDOM, TOBRATROUGH, TOBRAPEAK, TOBRARND, AMIKACINPEAK, AMIKACINTROU, AMIKACIN in the last 72 hours.   Microbiology: Recent Results (from the past 720 hour(s))  MRSA PCR Screening     Status: None   Collection Time: 04/27/14  4:12 AM  Result Value Ref Range Status   MRSA by PCR NEGATIVE NEGATIVE Final    Comment:        The GeneXpert MRSA Assay (FDA approved for NASAL specimens only), is one component of a comprehensive MRSA colonization surveillance program. It is not intended to diagnose MRSA infection nor to guide or monitor treatment for MRSA infections.     Medical History: Past Medical History  Diagnosis Date  . Stroke   . Anemia   . Hyperlipidemia   . Vitamin D deficiency   . Hypothyroid   . Pulmonary hypertension   . Chronic respiratory failure with hypoxia   . Blindness of right eye     Macular degeneration  . Macular degeneration   . Hypertensive heart disease 04/16/2012  . CHF (congestive heart failure)   . Hypertension   . UTI (urinary tract infection)    . Arthritis     KNEES   Assessment: 80 y/o F with PMH of CHF, atrial fibrillation, HTN, hypothyroidism, chronic anemia, CKD stage III who presents with worsening SOB, palpitations, and increasing swelling in lower extremities.  Patient initiated on Azithromycin and Ceftriaxone for possible CAP, now broadened to Vancomycin and Cefepime for sepsis, with pharmacy to assist with dosing.  12/14 >> Cefriaxone >> 12/14 12/14 >> Azithromycin >> 12/14 12/14 >> Cefepime >> 12/14 >> Vancomycin >>    Tmax: afebrile WBCs: WNL Renal: AKI, SCr 2.69 > 2.57, CrCl ~ 15 mL/min CG PCT: 0.35  12/14 MRSA PCR: negative  Goal of Therapy:  Vancomycin trough level 15-20 mcg/ml Appropriate antibiotic dosing for renal function and indication Eradication of infection  Plan:   Cefepime 2 grams IV x 1, then 1 gram IV q24h.  Vancomycin 1 gram IV q48h.  Plan for Vancomycin trough level at steady state.  BMET in AM to evaluate renal indices.  Monitor renal function, cultures, clinical course.   Greer PickerelJigna Astria Jordahl, PharmD, BCPS Pager: [phone] 04/27/2014 1:13 PM

## 2014-04-27 NOTE — Progress Notes (Addendum)
PROGRESS NOTE  [name] XLK:[id number] DOB: [birth year] DOA: 05/08/2014 PCP:  [name], Alan, MD  HPI: 80 y.o. female with history of CHF last year measured as 50%, atrial fibrillation, hypertension, hypothyroidism, chronic anemia, chronic kidney disease stage III presents to the ER because of shortness of breath. Patient states that she been short of breath last few weeks but acutely worsened last few days. In addition patient has been experiencing palpitations off and on for last 2-3 days.  Subjective / 24 H Interval events Feeling much better this morning  Assessment/Plan: Principal Problem:   Acute respiratory failure with hypoxia Active Problems:   Pleural effusion   Chronic diastolic CHF (congestive heart failure)   CKD (chronic kidney disease) stage 3, GFR 30-59 ml/min   Anemia of renal disease   Shortness of breath   Tachycardia   Acute respiratory failure    Acute respiratory failure with hypoxia - differentials include decompensated CHF versus pneumonia versus pulmonary embolism.  - stable on nasal cannula this morning, continue supplemental oxygen as needed  A flutter with variable conduction rate - I have spoken with Dr. Donnie Ahoilley from cardiology this morning and will consult on the patient - she is now rate controlled on cardizem however blood pressure is on the low side, she is asymptomatic. - she used to be on Amiodarone in the past up until about 2 years ago, will defer to cardiology decision to restart it.   Hypotension - started being more hypotensive throughout the morning - PCCM consulted - RUQ US showed possible acalculous cholecystitis, continue broad spectruma IV antibiotics, poor candidate for interventions at this point.   Acute on chronic diastolic heart failure - precipitated likely by the A flutter, BNP elevated - s/p Lasix IV overnight however became hypotensive and required small bolus - repeat a 2D echo today  Elevated D dimer - she is on heparin  gtt, will obtain VQ scan and LE DVT US  Recurrent pleural effusion - CT scan without evidence of worsening, effusions are small   Acute on chronic renal failure stage 3-4 with mild hyperkalemia - her baseline Cr is ~1.6-1.7, 2.69 on admission and stable to 2.57 this morning. Continue supportive treatment, avoid nephrotoxic agents - continue to monitor.   Chronic anemia - follow CBC. - no clinical evidence of bleeding  Hyperlipidemia - on statins.  Hypothyroidism - check TSH.   Diet: Diet Heart Fluids: none DVT Prophylaxis: heparin gtt  Code Status: Full Code Family Communication: none  Disposition Plan: remain in SDU  Consultants:  Cardiology   Procedures:  None    Antibiotics  Anti-infectives    Start     Dose/Rate Route Frequency Ordered Stop   04/27/14 0215  cefTRIAXone (ROCEPHIN) 1 g in dextrose 5 % 50 mL IVPB     1 g100 mL/hr over 30 Minutes Intravenous Daily at bedtime 04/27/14 0212     04/27/14 0200  azithromycin (ZITHROMAX) 500 mg in dextrose 5 % 250 mL IVPB     500 mg250 mL/hr over 60 Minutes Intravenous Every 24 hours 04/27/14 0154        Studies  Ct Chest Wo Contrast 04/20/2014 Small bilateral pleural effusions right larger than left. The right effusion demonstrates an appearance suggesting loculation.  There is moderately extensive consolidation in the right lower lobe. Air bronchograms are seen through much of this area. This could represent atelectasis or pneumonia.  Nonspecific ground-glass attenuation bilaterally possibly indicative of pulmonary edema. There does appear to be mild interstitial  pulmonary edema.  Dg Chest Port 1 View August 01, 2013 Ill-defined pulmonary vasculature with increased interstitial prominence suspicious for mild edema/congestive heart failure. Stable loculated right pleural effusion.   Objective  Filed Vitals:   04/27/14 0439 04/27/14 0531 04/27/14 0550 04/27/14 0552  BP:  80/50 88/42 92/50   Pulse: 36     Temp:        TempSrc:      Resp: 21     Height:      Weight:      SpO2: 96%       Intake/Output Summary (Last 24 hours) at 04/27/14 [id number] Last data filed at 04/27/14 0538  Gross per 24 hour  Intake    390 ml  Output    310 ml  Net     80 ml   Filed Weights   04/27/14 0045 04/27/14 0225  Weight: 74.844 kg (165 lb) 70.9 kg (156 lb 4.9 oz)    Exam:  General:  NAD  Cardiovascular: irregular  Respiratory: no wheezing, moves air well  Abdomen: soft, non tender  MSK: 1+ LE edema  Neuro: non focal  Data Reviewed: Basic Metabolic Panel:  Recent Labs Lab 02-28-2014 1851 02-28-2014 1954 04/27/14 0333  NA 137  --  140  K 6.2* 5.7* 4.6  CL 96  --  98  CO2 25  --  28  GLUCOSE 126*  --  157*  BUN 79*  --  73*  CREATININE 2.69*  --  2.57*  CALCIUM 9.5  --  9.5   Liver Function Tests:  Recent Labs Lab 02-28-2014 1851 04/27/14 0333  AST 39* 20  ALT 18 14  ALKPHOS 120* 114  BILITOT 0.2* 0.2*  PROT 7.9 7.5  ALBUMIN 3.3* 3.2*   CBC:  Recent Labs Lab 02-28-2014 1851 04/27/14 0333  WBC 7.4 7.1  NEUTROABS 5.5 5.4  HGB 9.3* 9.4*  HCT 30.0* 30.6*  MCV 88.0 88.7  PLT 378 359   Cardiac Enzymes:  Recent Labs Lab 02-28-2014 1851 04/27/14 0142  TROPONINI <0.30 <0.30   BNP (last 3 results)  Recent Labs  12/01/13 1507 03/26/14 1504 02-28-2014 1851  PROBNP 98.0 186.0* 15788.0*   CBG: No results for input(s): GLUCAP in the last 168 hours.  Recent Results (from the past 240 hour(s))  MRSA PCR Screening     Status: None   Collection Time: 04/27/14  4:12 AM  Result Value Ref Range Status   MRSA by PCR NEGATIVE NEGATIVE Final    Comment:        The GeneXpert MRSA Assay (FDA approved for NASAL specimens only), is one component of a comprehensive MRSA colonization surveillance program. It is not intended to diagnose MRSA infection nor to guide or monitor treatment for MRSA infections.      Scheduled Meds: . azithromycin  500 mg Intravenous Q24H  . cefTRIAXone  (ROCEPHIN)  IV  1 g Intravenous QHS  . levothyroxine  150 mcg Oral QAC breakfast  . pantoprazole  40 mg Oral Daily  . simvastatin  10 mg Oral QHS  . sodium chloride  3 mL Intravenous Q12H  . triamcinolone ointment  1 application Topical BID   Continuous Infusions: . diltiazem (CARDIZEM) infusion 5 mg/hr (04/27/14 0630)  . heparin 950 Units/hr (04/27/14 0315)    Time spent: 45 minutes  Pamella Pertostin Anthony Roland, MD [facility] Pager [phone]. If 7 PM - 7 AM, please contact night-coverage at [email or web address], password [email or web address] 04/27/2014, 7:12 AM  LOS: 1 day

## 2014-04-27 NOTE — Progress Notes (Signed)
eLink Physician-Brief Progress Note Patient Name: Jillian ServiceLila J Clay DOB: [birth year] MRN: [id number]   Date of Clay  04/27/2014  HPI/Events of Note  C/o severe rt shoulder blade pain On camera - in extremis 'sensitive' to narcotics  eICU Interventions  Fentnayl 12.5-25 mcg x 1 Considering RLL pna vs cholecystitis as causes - not tender abdomen     Intervention Category Intermediate Interventions: Pain - evaluation and management  Orvis Stann V. 04/27/2014, 9:32 PM

## 2014-04-27 NOTE — Progress Notes (Signed)
ANTICOAGULATION CONSULT NOTE - Initial Consult  Pharmacy Consult for Heparin Indication: pulmonary embolus  Allergies  Allergen Reactions  . Lisinopril Other (See Comments)    pancreatitis    Patient Measurements: Height: 5\' 3"  (160 cm) Weight: 165 lb (74.844 kg) IBW/kg (Calculated) : 52.4 Heparin Dosing Weight:   Vital Signs: Temp: 97.5 F (36.4 C) (12/13 2344) Temp Source: Oral (12/13 2344) BP: 100/56 mmHg (12/14 0100) Pulse Rate: 146 (12/14 0100)  Labs:  Recent Labs  05/08/2014 1851  HGB 9.3*  HCT 30.0*  PLT 378  CREATININE 2.69*  TROPONINI <0.30    Estimated Creatinine Clearance: 14.3 mL/min (by C-G formula based on Cr of 2.69).   Medical History: Past Medical History  Diagnosis Date  . Stroke   . Anemia   . Hyperlipidemia   . Vitamin D deficiency   . Hypothyroid   . Pulmonary hypertension   . Chronic respiratory failure with hypoxia   . Blindness of right eye     Macular degeneration  . Macular degeneration   . Hypertensive heart disease 04/16/2012  . CHF (congestive heart failure)   . Hypertension   . UTI (urinary tract infection)   . Arthritis     KNEES    Medications:  Infusions:  . diltiazem (CARDIZEM) infusion    . heparin    . heparin      Assessment: Patient with PE, MD has ordered heparin per pharmacy for PE.  Patient with poor renal function.  Goal of Therapy:  Heparin level 0.3-0.7 units/ml Monitor platelets by anticoagulation protocol: Yes   Plan:  Heparin bolus 2000 units iv x1 Heparin drip at 950  units/hr Daily heparin level and CBC Next heparin level at  1100    [address]Johnella Crumm Jr, [place] SpringsJulian Crowford 04/27/2014,1:34 AM

## 2014-04-27 NOTE — Progress Notes (Signed)
Peripherally Inserted Central Catheter/Midline Placement  The IV Nurse has discussed with the patient and/or persons authorized to consent for the patient, the purpose of this procedure and the potential benefits and risks involved with this procedure.  The benefits include less needle sticks, lab draws from the catheter and patient may be discharged home with the catheter.  Risks include, but not limited to, infection, bleeding, blood clot (thrombus formation), and puncture of an artery; nerve damage and irregular heat beat.  Alternatives to this procedure were also discussed.  PICC/Midline Placement Documentation  PICC / Midline Double Lumen 04/27/14 PICC Right Basilic 38 cm 0 cm (Active)  Indication for Insertion or Continuance of Line Poor Vasculature-patient has had multiple peripheral attempts or PIVs lasting less than 24 hours 04/27/2014  6:00 PM  Exposed Catheter (cm) 0 cm 04/27/2014  6:00 PM  Dressing Change Due 05/04/14 04/27/2014  6:00 PM       Stacie GlazeJoyce, Tnia Anglada Horton 04/27/2014, 6:34 PM

## 2014-04-27 NOTE — Progress Notes (Signed)
MD Donnie Ahoilley at bedside speaking with patient.

## 2014-04-27 NOTE — Progress Notes (Signed)
Noticed rhythm change on monitor. Shot EKG. Patient asymptomatic and resting. Called the box and spoke with MD Vassie LollAlva to make aware. No new orders.

## 2014-04-27 NOTE — Progress Notes (Signed)
Text-paged MD about patient's BP 87/39 MAP 56, HR 93 in a. Flutter and cardizem at 5.

## 2014-04-27 NOTE — Progress Notes (Signed)
Placed order for IV team

## 2014-04-28 ENCOUNTER — Encounter (HOSPITAL_COMMUNITY): Payer: Self-pay | Admitting: Anesthesiology

## 2014-04-28 ENCOUNTER — Other Ambulatory Visit (HOSPITAL_COMMUNITY): Payer: Medicare Other

## 2014-04-28 ENCOUNTER — Encounter (HOSPITAL_COMMUNITY): Payer: Self-pay

## 2014-04-28 ENCOUNTER — Inpatient Hospital Stay (HOSPITAL_COMMUNITY): Payer: Medicare Other

## 2014-04-28 DIAGNOSIS — E875 Hyperkalemia: Secondary | ICD-10-CM

## 2014-04-28 DIAGNOSIS — N179 Acute kidney failure, unspecified: Secondary | ICD-10-CM

## 2014-04-28 LAB — CBC
HCT: 31.6 % — ABNORMAL LOW (ref 36.0–46.0)
HEMOGLOBIN: 9.9 g/dL — AB (ref 12.0–15.0)
MCH: 27.6 pg (ref 26.0–34.0)
MCHC: 31.3 g/dL (ref 30.0–36.0)
MCV: 88 fL (ref 78.0–100.0)
Platelets: 312 10*3/uL (ref 150–400)
RBC: 3.59 MIL/uL — ABNORMAL LOW (ref 3.87–5.11)
RDW: 17.3 % — ABNORMAL HIGH (ref 11.5–15.5)
WBC: 15.3 10*3/uL — ABNORMAL HIGH (ref 4.0–10.5)

## 2014-04-28 LAB — BASIC METABOLIC PANEL
Anion gap: 23 — ABNORMAL HIGH (ref 5–15)
BUN: 69 mg/dL — ABNORMAL HIGH (ref 6–23)
CHLORIDE: 91 meq/L — AB (ref 96–112)
CO2: 18 mEq/L — ABNORMAL LOW (ref 19–32)
CREATININE: 3.16 mg/dL — AB (ref 0.50–1.10)
Calcium: 8.6 mg/dL (ref 8.4–10.5)
GFR calc Af Amer: 14 mL/min — ABNORMAL LOW (ref >90)
GFR calc non Af Amer: 12 mL/min — ABNORMAL LOW (ref >90)
GLUCOSE: 252 mg/dL — AB (ref 70–99)
POTASSIUM: 6.2 meq/L — AB (ref 3.7–5.3)
Sodium: 132 mEq/L — ABNORMAL LOW (ref 137–147)

## 2014-04-28 LAB — BLOOD GAS, ARTERIAL
ACID-BASE DEFICIT: 6.9 mmol/L — AB (ref 0.0–2.0)
ACID-BASE DEFICIT: 6.5 mmol/L — AB (ref 0.0–2.0)
Bicarbonate: 20.3 mEq/L (ref 20.0–24.0)
Bicarbonate: 18.6 mEq/L — ABNORMAL LOW (ref 20.0–24.0)
DRAWN BY: 232811
Drawn by: 31814
FIO2: 1 %
FIO2: 1 %
LHR: 28 {breaths}/min
O2 SAT: 97.8 %
O2 Saturation: 98.2 %
PEEP: 5 cmH2O
PO2 ART: 183 mmHg — AB (ref 80.0–100.0)
Patient temperature: 98.3
Patient temperature: 97.5
TCO2: 20 mmol/L (ref 0–100)
TCO2: 17.8 mmol/L (ref 0–100)
VT: 450 mL
pCO2 arterial: 52.5 mmHg — ABNORMAL HIGH (ref 35.0–45.0)
pCO2 arterial: 36.4 mmHg (ref 35.0–45.0)
pH, Arterial: 7.212 — ABNORMAL LOW (ref 7.350–7.450)
pH, Arterial: 7.325 — ABNORMAL LOW (ref 7.350–7.450)
pO2, Arterial: 196 mmHg — ABNORMAL HIGH (ref 80.0–100.0)

## 2014-04-28 LAB — TROPONIN I
TROPONIN I: 0.31 ng/mL — AB (ref <0.30)
Troponin I: 1.11 ng/mL (ref <0.30)

## 2014-04-28 LAB — PROTIME-INR
INR: 1.43 (ref 0.00–1.49)
PROTHROMBIN TIME: 17.6 s — AB (ref 11.6–15.2)

## 2014-04-28 LAB — LIPASE, BLOOD: LIPASE: 39 U/L (ref 11–59)

## 2014-04-28 LAB — CORTISOL: CORTISOL PLASMA: 33.4 ug/dL

## 2014-04-28 MED ORDER — DEXTROSE 50 % IV SOLN
1.0000 | Freq: Once | INTRAVENOUS | Status: AC
  Administered 2014-04-28: 50 mL via INTRAVENOUS
  Filled 2014-04-28: qty 50

## 2014-04-28 MED ORDER — SODIUM POLYSTYRENE SULFONATE 15 GM/60ML PO SUSP
30.0000 g | Freq: Once | ORAL | Status: AC
  Administered 2014-04-28: 30 g via ORAL
  Filled 2014-04-28: qty 120

## 2014-04-28 MED ORDER — PHENYLEPHRINE HCL 10 MG/ML IJ SOLN
0.0000 ug/min | INTRAVENOUS | Status: DC
  Administered 2014-04-28: 200 ug/min via INTRAVENOUS
  Administered 2014-04-28: 360 ug/min via INTRAVENOUS
  Administered 2014-04-28: 300 ug/min via INTRAVENOUS
  Filled 2014-04-28 (×3): qty 4

## 2014-04-28 MED ORDER — DEXTROSE 50 % IV SOLN
50.0000 mL | Freq: Once | INTRAVENOUS | Status: AC
  Administered 2014-04-28: 50 mL via INTRAVENOUS

## 2014-04-28 MED ORDER — SODIUM BICARBONATE 8.4 % IV SOLN
INTRAVENOUS | Status: AC
  Filled 2014-04-28: qty 50

## 2014-04-28 MED ORDER — SODIUM BICARBONATE 8.4 % IV SOLN
100.0000 meq | Freq: Once | INTRAVENOUS | Status: AC
  Administered 2014-04-28: 100 meq via INTRAVENOUS
  Filled 2014-04-28: qty 100

## 2014-04-28 MED ORDER — PROMETHAZINE HCL 25 MG/ML IJ SOLN
12.5000 mg | Freq: Once | INTRAMUSCULAR | Status: AC
  Administered 2014-04-28: 12.5 mg via INTRAVENOUS
  Filled 2014-04-28: qty 1

## 2014-04-28 MED ORDER — INSULIN ASPART 100 UNIT/ML ~~LOC~~ SOLN
10.0000 [IU] | Freq: Once | SUBCUTANEOUS | Status: AC
  Administered 2014-04-28: 10 [IU] via INTRAVENOUS

## 2014-04-28 MED ORDER — INSULIN ASPART 100 UNIT/ML IV SOLN
5.0000 [IU] | Freq: Once | INTRAVENOUS | Status: AC
  Administered 2014-04-28: 5 [IU] via INTRAVENOUS

## 2014-04-28 MED ORDER — FENTANYL CITRATE 0.05 MG/ML IJ SOLN
50.0000 ug | INTRAMUSCULAR | Status: DC | PRN
  Filled 2014-04-28: qty 2

## 2014-04-28 MED ORDER — FENTANYL CITRATE 0.05 MG/ML IJ SOLN
50.0000 ug | INTRAMUSCULAR | Status: DC | PRN
  Administered 2014-04-28: 50 ug via INTRAVENOUS

## 2014-04-28 MED ORDER — CALCIUM GLUCONATE 10 % IV SOLN
1.0000 g | Freq: Once | INTRAVENOUS | Status: AC
  Administered 2014-04-28: 1 g via INTRAVENOUS
  Filled 2014-04-28: qty 10

## 2014-04-28 MED ORDER — PANTOPRAZOLE SODIUM 40 MG IV SOLR: 40.0000 mg | Freq: Every day | INTRAVENOUS | Status: DC

## 2014-04-28 MED ORDER — FENTANYL CITRATE 0.05 MG/ML IJ SOLN
INTRAMUSCULAR | Status: AC
  Filled 2014-04-28: qty 2

## 2014-04-28 MED ORDER — FENTANYL CITRATE 0.05 MG/ML IJ SOLN: 50.0000 ug | INTRAMUSCULAR | Status: DC | PRN

## 2014-04-28 MED ORDER — ALBUTEROL SULFATE (2.5 MG/3ML) 0.083% IN NEBU: 2.5000 mg | INHALATION_SOLUTION | RESPIRATORY_TRACT | Status: DC | PRN

## 2014-04-28 MED ORDER — VASOPRESSIN 20 UNIT/ML IV SOLN
0.0300 [IU]/min | INTRAVENOUS | Status: DC
  Administered 2014-04-28: 0.03 [IU]/min via INTRAVENOUS
  Filled 2014-04-28: qty 2

## 2014-04-28 MED ORDER — SODIUM BICARBONATE 8.4 % IV SOLN
INTRAVENOUS | Status: DC
  Administered 2014-04-28: 06:00:00 via INTRAVENOUS
  Filled 2014-04-28 (×3): qty 150

## 2014-04-28 MED FILL — Medication: Qty: 1 | Status: AC

## 2014-05-02 NOTE — Discharge Summary (Signed)
NAMB[place]bye [place]:  Mast, Micky                  ACCOUNT NO.:  [id number]  MEDICAL RECORD NO.:  [id number]  LOCATION:  [id number]                         [facility]  PHYSICIAN:  Veto KempsUGLAS BRENT MCQUAID, MDDATE OF BIRTH:  05-27-1924  DATE OF ADMISSION:  04/15/2014 DATE OF DISCHARGE:  Oct 23, 2013                              DISCHARGE SUMMARY   DEATH SUMMARY  DATE OF DEATH:  April 28, 2014.  CAUSE OF DEATH: 1. Septic shock. 2. Acalculous cholecystitis.  HISTORY OF PRESENT ILLNESS:  This is an 80 year old female, who was admitted on April 26, 2014, with shortness of breath.  She was found to have a loculated right pleural effusion and she further developed hypotension and septic shock on April 27, 2014, and Pulmonary and Critical Care Medicine was consulted.  PAST MEDICAL HISTORY, FAMILY HISTORY, SOCIAL HISTORY, REVIEW OF SYSTEMS: See the admission H and P.  PHYSICAL EXAMINATION:  VITAL SIGNS:  On the day of death temperature 98.3, heart rate 98, respirations 29, blood pressure 140/60 on vasopressors, SpO2 99%. GENERAL:  Frail, elderly female on vent. NEURO:  Intermittently awake on vent. HEENT:  [place]/AT.  Extraocular movements are intact.  ET tube is in place. CARDIOVASCULAR:  Irregularly irregular.  No murmurs, gallops, or rubs. LUNGS:  Rhonchi bilaterally. ABDOMEN:  There is some tenderness to palpation all over. MUSCULOSKELETAL:  Normal bulk and tone as expected for her age. SKIN:  No rash.  HOSPITAL COURSE:  The patient was admitted by the Hospitalist Service on April 26, 2014, for further evaluation of sepsis.  Her sepsis worsened and it became clear that she had pain in her right upper quadrant.  Initially, there was some concern that this could be pneumonia, but further evaluation was strongly suggestive that this was an acalculous cholecystitis.  The patient was treated with broad- spectrum antibiotics for further evaluation of this pain.  PICC line was placed for  vasopressor support and a Pulmonary and Critical Care service was consulted.  At the time of initial consultation, it was felt by the Pulmonary and Critical Care team that the patient was quite frail and with her multiple comorbid illnesses, her predicted mortality from this illness was exceedingly high.  It was also felt that it would be very difficult for the patient to survive this illness or undergo cardiopulmonary resuscitation.  Discussions of limitation of care were begun with the patient's family on April 27, 2014.  Unfortunately, she deteriorated overnight and early in the morning on April 28, 2014.  She did have a cardiac arrest.  CPR was performed briefly for approximately 3 minutes and she was intubated and placed on the ventilator.  The case was discussed with the patient's family by Pulmonary and Critical Care Medicine at that time, and the overall situation was reviewed at length.  It was determined at that point, that it would be appropriate to limit her care and to change her code status to DNR.  Unfortunately, her septic shock continued to progress and she died with her family at the bedside on May 01, 2014.          ______________________________ Veto KempsUGLAS BRENT MCQUAID, MD     DBM/MEDQ  D:  05/01/2014  T:  05/02/2014  Job:  [id number]

## 2014-05-15 NOTE — Progress Notes (Signed)
Bladder scan performed. No volume scanned in bladder.

## 2014-05-15 NOTE — Progress Notes (Signed)
RT Note: RT was unable to get into patients room to assess her prior to her expiring due to her RN asking us to wait and come back to see her. Her RN was at bedside as well as her family. Rt will continue to monitor.

## 2014-05-15 NOTE — Progress Notes (Signed)
PULMONARY / CRITICAL CARE MEDICINE   Name: [name] MRN: [id number] DOB: 08/23/1924    ADMISSION DATE:  2013/07/22 CONSULTATION DATE:  04/27/14  REFERRING MD :  Dr. Elvera LennoxGherghe   CHIEF COMPLAINT:  Hypotension   INITIAL PRESENTATION:  81 y/o F admitted 12/13 with SOB.  Found to have a loculated R pleural effusion.  She further developed hypotension on 12/14 and PCCM consulted for evaluation.   STUDIES:  12/13  CT Chest >> small bilateral pleural effusions R>L, R appears loculated, extensive consolidation in RLL with air bronchograms, mild interstitial edema  12/14  VQ Scan >> low probability for PE 12/14  LE Doppler >> prelim neg for DVT >>  12/14: Distended gallbladder with gallbladder wall thickening andpericholecystic fluid. There is also a small amount of perihepatic ascites. Findings are concerning for acalculous cholecystitis 12/14 CT abd: 1. Gallbladder distention and wall thickening which suggests acute cholecystitis.  SIGNIFICANT EVENTS: 12/13  Admit with SOB, found to have loculated R pleural effusion  12/15 early AM PEA arrest-->felt to be d/t septic shock and profound metabolic acidosis in setting of acute Cholecystitis   SUBJECTIVE:  Cardiac arrest early AM in setting of worsening septic shock  VITAL SIGNS: Temp:  [97.3 F (36.3 C)-98.3 F (36.8 C)] 98.3 F (36.8 C) (12/14 2000) Pulse Rate:  [30-138] 86 (12/15 0655) Resp:  [11-36] 29 (12/15 0800) BP: (49-151)/(20-93) 140/60 mmHg (12/15 0559) SpO2:  [82 %-100 %] 100 % (12/15 0800) FiO2 (%):  [100 %] 100 % (12/15 0559) Weight:  [73.8 kg (162 lb 11.2 oz)] 73.8 kg (162 lb 11.2 oz) (12/15 0448)   HEMODYNAMICS:     VENTILATOR SETTINGS: Vent Mode:  [-] PRVC FiO2 (%):  [100 %] 100 % Set Rate:  [28 bmp] 28 bmp Vt Set:  [450 mL] 450 mL PEEP:  [5 cmH20] 5 cmH20 Plateau Pressure:  [30 cmH20] 30 cmH20   INTAKE / OUTPUT:  Intake/Output Summary (Last 24 hours) at 05/07/2014 [id number] Last data filed at 04/19/2014 0800  Gross  per 24 hour  Intake 1905.59 ml  Output    135 ml  Net 1770.59 ml    PHYSICAL EXAMINATION: General:  Frail elderly female on vent Neuro:  Unresponsive  HEENT:  NCAT, EOMi, ETT in place Cardiovascular:  Irreg irreg, no mgr Lungs:  Rhonchi bilaterally Abdomen:  Some tenderness to palpation Musculoskeletal:  Normal bulk and tone as expected for her age Skin:  No rash  LABS:  CBC  Recent Labs Lab 04/27/14 0333 04/27/14 2300 05/09/2014 0500  WBC 7.1 9.3 15.3*  HGB 9.4* 9.2* 9.9*  HCT 30.6* 29.9* 31.6*  PLT 359 399 312   Coag's  Recent Labs Lab 04/27/14 0142 05/01/2014 0500  APTT 32  --   INR 1.02 1.43   BMET  Recent Labs Lab 04/27/14 0333 04/27/14 2300 04/20/2014 0500  NA 140 134* 132*  K 4.6 6.0* 6.2*  CL 98 94* 91*  CO2 28 22 18*  BUN 73* 70* 69*  CREATININE 2.57* 2.84* 3.16*  GLUCOSE 157* 193* 252*   Electrolytes  Recent Labs Lab 04/27/14 0333 04/27/14 2300 04/19/2014 0500  CALCIUM 9.5 8.6 8.6   Sepsis Markers  Recent Labs Lab 04/27/14 0142 04/27/14 0650 04/27/14 2300  LATICACIDVEN  --  1.1 2.7*  PROCALCITON 0.35  --   --    ABG  Recent Labs Lab 04/27/14 2305 05/06/2014 0537 04/27/2014 0650  PHART 7.310* 7.212* 7.325*  PCO2ART 42.2 52.5* 36.4  PO2ART 70.6* 183.0* 196.0*  Liver Enzymes  Recent Labs Lab September 10, 2013 1851 04/27/14 0333 04/27/14 2300  AST 39* 20 511*  ALT 18 14 462*  ALKPHOS 120* 114 186*  BILITOT 0.2* 0.2* 0.4  ALBUMIN 3.3* 3.2* 2.8*   Cardiac Enzymes  Recent Labs Lab September 10, 2013 1851  04/27/14 1216 04/27/14 2300 05/04/2014 0500  TROPONINI <0.30  < > <0.30 0.31* 1.11*  PROBNP 15788.0*  --   --   --   --   < > = values in this interval not displayed. Glucose No results for input(s): GLUCAP in the last 168 hours.  Imaging [place] Abdomen Complete  04/27/2014   CLINICAL DATA:  Right upper quadrant abdominal pain.  EXAM: ULTRASOUND ABDOMEN COMPLETE  COMPARISON:  CT 11/02/2009  FINDINGS: Gallbladder: The gallbladder is  distended with an edematous wall and pericholecystic fluid. Wall thickening roughly measures up to the 0.9 cm and appears to be asymmetric. No evidence for gallstones. Patient reportedly does not have a sonographic Murphy's sign.  Common bile duct: Diameter: 0.2 cm.  Liver: Normal echogenicity without focal lesions. There is a small amount of perihepatic ascites.  IVC: No abnormality visualized.  Pancreas: Visualized portion unremarkable.  Spleen: Size and appearance within normal limits.  Right Kidney: Length: 9.1 cm. Negative for hydronephrosis. No suspicious renal lesions.  Left Kidney: Length: 9.7 cm. Normal echogenicity with mild cortical thinning. Negative for hydronephrosis.  Abdominal aorta: No aneurysm visualized.  Other findings: Left pleural effusion.  IMPRESSION: Distended gallbladder with gallbladder wall thickening and pericholecystic fluid. There is also a small amount of perihepatic ascites. Findings are concerning for acalculous cholecystitis. The wall thickening and/or pericholecystic fluid is asymmetric and CT imaging may be useful to exclude additional fluid collections.  These results were called by telephone at the time of interpretation on 04/27/2014 at 3:12 pm to [facility]I OLLIS , who verbally acknowledged these results.   Electronically Signed   By: Richarda OverlieAdam  Henn M.D.   On: 04/27/2014 15:13   Nm Pulmonary Perf And Vent  04/27/2014   CLINICAL DATA:  Shortness of breath and back pain.  EXAM: NUCLEAR MEDICINE VENTILATION - PERFUSION LUNG SCAN  TECHNIQUE: Ventilation images were obtained in multiple projections using inhaled aerosol technetium 99 M DTPA. Perfusion images were obtained in multiple projections after intravenous injection of Tc-7630m MAA.  RADIOPHARMACEUTICALS:  45.0 mCi Tc-2330m DTPA aerosol and 5.1 mCi Tc-6730m MAA  COMPARISON:  Chest x-ray Jan 05, 2014 and noncontrast chest CT Jan 05, 2014  FINDINGS: Ventilation: Diffuse bilateral heterogeneous uptake. Focal increased uptake over the  central left bronchi. Matched to defect over the posterior right base compatible known effusion. Mass small focal decreased uptake over the posterior basilar left lower lobe.  Perfusion: Mild nonsegmental decreased uptake over the posterior right base compatible patient's known the fusion. Small peripheral wedge-shaped perfusion defect over the posterior basilar segment of the left lower lobe.  IMPRESSION: Findings compatible with very low probability for pulmonary embolism.   Electronically Signed   By: Elberta Fortisaniel  Boyle M.D.   On: 04/27/2014 12:34   Dg Chest Port 1 View  04/27/2014   CLINICAL DATA:  PICC line placement  EXAM: PORTABLE CHEST - 1 VIEW  COMPARISON:  CT chest Jan 05, 2014  FINDINGS: Right-sided PICC line with the tip projecting over the SVC.  Bilateral small pleural effusions, right greater than left. Right basilar airspace disease likely reflecting atelectasis versus pneumonia. Mild bilateral interstitial thickening. No pneumothorax. Stable cardiomediastinal silhouette. No acute osseous abnormality.  IMPRESSION: 1. Right-sided PICC line with the tip projecting over the SVC.  2. Bilateral pleural effusions, right greater than left, with mild bilateral interstitial thickening concerning for mild pulmonary edema.   Electronically Signed   By: Elige Ko   On: 04/27/2014 18:59     ASSESSMENT / PLAN:   Acute respiratory failure due to poor compensation for metabolic acidosis Loculated right-sided effusion - s/p repeat thora's (last ~ 5 months ago) Probable right PNA Small left effusion Pulmonary hypertension Septic shock complicated by pulmonary hypertension and arrhythmia Demand ischemia Atrial Flutter  Systolic CHF - with acute decompensation, pBNP elevated on admission   Acute on chronic kidney disease - in the setting of hypotension Hyperkalemia Metabolic acidosis> due to lactic acidosis and renal failure Not a candidate for HD considering multiple medical comorbid  illnesses Acalculous cholecystitis on [place] Poor surgical candidate Chronic Anemia  RLL PNA  R Loculated Effusion  Acute cholecystis w/ septic shock Mild Hyperglycemia  Rule out Adrenal Insufficiency  Acute encephalopathy Sedation needs for vent  Now actively dying. Nothing else to add. Family at bedside  Plan Supportive family care No further intervention   Simonne Martinet ACNP-BC [facility] Pulmonary/Critical Care Pager # [phone] OR # [phone] if no answer   05/17/14, 8:26 AM

## 2014-05-15 NOTE — Progress Notes (Signed)
RT Note: Rt attempted to see patient and look at her ventilator. But RT was asked to come back by the patients nurse due to the decline in the patients status and the family being at bedside. Pt does remain on her initial vent settings that she was placed on post intubation this AM. Rt has made no changes. Rt will assess patient whenever it is possible.  Rt will continue to monitor.

## 2014-05-15 NOTE — Progress Notes (Signed)
Morgue care done.  Pt placed on stretcher to go to morgue.  Jaicee Michelotti RN

## 2014-05-15 NOTE — Progress Notes (Signed)
Nutrition Brief Note  Pt identified as ventilated patient Chart reviewed. Pt now transitioning to comfort care.  No further nutrition interventions warranted at this time.  Please consult as needed.   Lloyd HugerSarah F Bekim Werntz MS RD LDN Clinical Dietitian [phone]

## 2014-05-15 NOTE — Progress Notes (Signed)
Called to pt's room after pt passed. Pt's husband, daughters, sons-in-law were bedside and very (approprirately) tearful. Pt's husband said he was in shock. I learned during visit the pt's daughter passed a few weeks ago. Pt's husband affirmed that prayer was comforting and I was also told [facility]. Family gathered around bedside and had prayer w/family. During visit following prayer family continued to speak of what a great wife and mother she was. Husband just sat at bedside staring. Granddaughters and other family members arrived a few minutes later. Provided emotional support as needed. Marjory Liesamela Carrington Holder Chaplain   05/13/2014 0900  Clinical Encounter Type  Visited With Family

## 2014-05-15 NOTE — Progress Notes (Signed)
PULMONARY / CRITICAL CARE MEDICINE   Name: [name] MRN: [id number] DOB: Oct 07, 1924    ADMISSION DATE:  05/04/2014 CONSULTATION DATE:  04/27/14  REFERRING MD :  Dr. Elvera LennoxGherghe   CHIEF COMPLAINT:  Hypotension   INITIAL PRESENTATION: 81 y/o F admitted 12/13 with SOB.  Found to have a loculated R pleural effusion.  She further developed hypotension on 12/14 and PCCM consulted for evaluation.   STUDIES:  12/13  CT Chest >> small bilateral pleural effusions R>L, R appears loculated, extensive consolidation in RLL with air bronchograms, mild interstitial edema  12/14  VQ Scan >> low probability for PE 12/14  LE Doppler >> prelim neg for DVT >>   SIGNIFICANT EVENTS: 12/13  Admit with SOB, found to have loculated R pleural effusion  12/15 early AM PEA arrest  SUBJECTIVE:  Cardiac arrest early AM in setting of worsening septic shock  VITAL SIGNS: Temp:  [97.3 F (36.3 C)-98.3 F (36.8 C)] 98.3 F (36.8 C) (12/14 2000) Pulse Rate:  [30-138] 98 (12/15 0559) Resp:  [11-36] 29 (12/15 0559) BP: (49-140)/(20-93) 140/60 mmHg (12/15 0559) SpO2:  [86 %-100 %] 99 % (12/15 0559) FiO2 (%):  [100 %] 100 % (12/15 0559) Weight:  [73.8 kg (162 lb 11.2 oz)] 73.8 kg (162 lb 11.2 oz) (12/15 0448)   HEMODYNAMICS:     VENTILATOR SETTINGS: Vent Mode:  [-] PRVC FiO2 (%):  [100 %] 100 % Set Rate:  [28 bmp] 28 bmp Vt Set:  [450 mL] 450 mL PEEP:  [5 cmH20] 5 cmH20 Plateau Pressure:  [30 cmH20] 30 cmH20   INTAKE / OUTPUT:  Intake/Output Summary (Last 24 hours) at 05/14/14 [id number] Last data filed at 05/14/14 0400  Gross per 24 hour  Intake 1722.17 ml  Output    235 ml  Net 1487.17 ml    PHYSICAL EXAMINATION: General:  Frail elderly female on vent Neuro:  Intermittently awake on vent HEENT:  NCAT, EOMi, ETT in place Cardiovascular:  Irreg irreg, no mgr Lungs:  Rhonchi bilaterally Abdomen:  Some tenderness to palpation Musculoskeletal:  Normal bulk and tone as expected for her age Skin:  No  rash  LABS:  CBC  Recent Labs Lab 04/27/14 0333 04/27/14 2300 05/14/14 0500  WBC 7.1 9.3 15.3*  HGB 9.4* 9.2* 9.9*  HCT 30.6* 29.9* 31.6*  PLT 359 399 312   Coag's  Recent Labs Lab 04/27/14 0142 05/14/14 0500  APTT 32  --   INR 1.02 1.43   BMET  Recent Labs Lab 04/27/14 0333 04/27/14 2300 05/14/14 0500  NA 140 134* 132*  K 4.6 6.0* 6.2*  CL 98 94* 91*  CO2 28 22 18*  BUN 73* 70* 69*  CREATININE 2.57* 2.84* 3.16*  GLUCOSE 157* 193* 252*   Electrolytes  Recent Labs Lab 04/27/14 0333 04/27/14 2300 05/14/14 0500  CALCIUM 9.5 8.6 8.6   Sepsis Markers  Recent Labs Lab 04/27/14 0142 04/27/14 0650 04/27/14 2300  LATICACIDVEN  --  1.1 2.7*  PROCALCITON 0.35  --   --    ABG  Recent Labs Lab 04/27/14 2305 05/14/14 0537  PHART 7.310* 7.212*  PCO2ART 42.2 52.5*  PO2ART 70.6* 183.0*     Liver Enzymes  Recent Labs Lab 04/15/2014 1851 04/27/14 0333 04/27/14 2300  AST 39* 20 511*  ALT 18 14 462*  ALKPHOS 120* 114 186*  BILITOT 0.2* 0.2* 0.4  ALBUMIN 3.3* 3.2* 2.8*   Cardiac Enzymes  Recent Labs Lab 04/22/2014 1851  04/27/14 1216 04/27/14 2300 05/14/14  0500  TROPONINI <0.30  < > <0.30 0.31* 1.11*  PROBNP 15788.0*  --   --   --   --   < > = values in this interval not displayed. Glucose No results for input(s): GLUCAP in the last 168 hours.  Imaging Us Abdomen Complete  04/27/2014   CLINICAL DATA:  Right upper quadrant abdomiKoreanal pain.  EXAM: ULTRASOUND ABDOMEN COMPLETE  COMPARISON:  CT 11/02/2009  FINDINGS: Gallbladder: The gallbladder is distended with an edematous wall and pericholecystic fluid. Wall thickening roughly measures up to the 0.9 cm and appears to be asymmetric. No evidence for gallstones. Patient reportedly does not have a sonographic Murphy's sign.  Common bile duct: Diameter: 0.2 cm.  Liver: Normal echogenicity without focal lesions. There is a small amount of perihepatic ascites.  IVC: No abnormality visualized.  Pancreas:  Visualized portion unremarkable.  Spleen: Size and appearance within normal limits.  Right Kidney: Length: 9.1 cm. Negative for hydronephrosis. No suspicious renal lesions.  Left Kidney: Length: 9.7 cm. Normal echogenicity with mild cortical thinning. Negative for hydronephrosis.  Abdominal aorta: No aneurysm visualized.  Other findings: Left pleural effusion.  IMPRESSION: Distended gallbladder with gallbladder wall thickening and pericholecystic fluid. There is also a small amount of perihepatic ascites. Findings are concerning for acalculous cholecystitis. The wall thickening and/or pericholecystic fluid is asymmetric and CT imaging may be useful to exclude additional fluid collections.  These results were called by telephone at the time of interpretation on 04/27/2014 at 3:12 pm to [facility]LLIS , who verbally acknowledged these results.   Electronically Signed   By: Richarda OverlieAdam  Henn M.D.   On: 04/27/2014 15:13   Nm Pulmonary Perf And Vent  04/27/2014   CLINICAL DATA:  Shortness of breath and back pain.  EXAM: NUCLEAR MEDICINE VENTILATION - PERFUSION LUNG SCAN  TECHNIQUE: Ventilation images were obtained in multiple projections using inhaled aerosol technetium 99 M DTPA. Perfusion images were obtained in multiple projections after intravenous injection of Tc-1447m MAA.  RADIOPHARMACEUTICALS:  45.0 mCi Tc-2247m DTPA aerosol and 5.1 mCi Tc-2447m MAA  COMPARISON:  Chest x-ray 04/24/2014 and noncontrast chest CT 04/25/2014  FINDINGS: Ventilation: Diffuse bilateral heterogeneous uptake. Focal increased uptake over the central left bronchi. Matched to defect over the posterior right base compatible known effusion. Mass small focal decreased uptake over the posterior basilar left lower lobe.  Perfusion: Mild nonsegmental decreased uptake over the posterior right base compatible patient's known the fusion. Small peripheral wedge-shaped perfusion defect over the posterior basilar segment of the left lower lobe.  IMPRESSION:  Findings compatible with very low probability for pulmonary embolism.   Electronically Signed   By: Elberta Fortisaniel  Boyle M.D.   On: 04/27/2014 12:34   Dg Chest Port 1 View  04/27/2014   CLINICAL DATA:  PICC line placement  EXAM: PORTABLE CHEST - 1 VIEW  COMPARISON:  CT chest 05/06/2014  FINDINGS: Right-sided PICC line with the tip projecting over the SVC.  Bilateral small pleural effusions, right greater than left. Right basilar airspace disease likely reflecting atelectasis versus pneumonia. Mild bilateral interstitial thickening. No pneumothorax. Stable cardiomediastinal silhouette. No acute osseous abnormality.  IMPRESSION: 1. Right-sided PICC line with the tip projecting over the SVC. 2. Bilateral pleural effusions, right greater than left, with mild bilateral interstitial thickening concerning for mild pulmonary edema.   Electronically Signed   By: Elige KoHetal  Patel   On: 04/27/2014 18:59     ASSESSMENT / PLAN:  PULMONARY OETT A: Acute respiratory failure due to poor compensation for metabolic  acidosis Loculated right-sided effusion - s/p repeat thora's (last ~ 5 months ago) Probable right PNA Small left effusion Pulmonary hypertension P:   Full vent support, high Minute ventilation strategy for now  Pulmonary hygiene as able See ID   CARDIOVASCULAR R PICC 12/14 >> A:  Septic shock complicated by pulmonary hypertension and arrhythmia Demand ischemia Atrial Flutter  Systolic CHF - with acute decompensation, pBNP elevated on admission P:  Neosynephrine gtt for MAP > 65 Stress steroids  F/U repeat Echo Amiodarone per cardiology Hold anti-hypertensives  RENAL A:   Acute on chronic kidney disease - in the setting of hypotension Hyperkalemia Metabolic acidosis> due to lactic acidosis and renal failure Not a candidate for HD considering multiple medical comorbid illnesses P:   Bicarb gtt Trend BMP Calcium, glucose, insulin now Kayexelate now  GASTROINTESTINAL A:   Acalculous  cholecystitis on [place] Poor surgical candidate P:   IR to place percutaneous drain NPO OG tube  HEMATOLOGIC A:   Chronic Anemia  P:  Trend CBC  INFECTIOUS A:   RLL PNA  R Loculated Effusion  P:   Hold cultures as patient has been on abx x 48 hours   Abx:  Vanco, start date 12/14, day 3/x Abx: Zosyn, start date 12/14, day 2/x     ENDOCRINE A:   Mild Hyperglycemia  Rule out Adrenal Insufficiency  P:   Stress steroids Monitor glucose on BMP, if consistently > 180, add SSI   NEUROLOGIC A:   Acute encephalopathy Sedation needs for vent P:   RASS goal -1 PAD protocol, intermittent fentanlyl   FAMILY  - Updates: Updated husband and daughter via phone early AM and then again at bedside 12/15 by [facility].  They understand the severity of the situtation.  I explained that she already underwent CPR and that was likely too aggressive considering the complexity of her chronic medical illnesses compounded by the severity of this acute illness.  I explained that the likelihood of survival is low and that she is not a candidate for dialysis based on her advanced age, multiple comorbid problems and low likelihood of survival.  Further, I recommended that we not put her through more CPR for the same reason.  The husband and daughters agreed to this strategy.   CODE STATUS: LIMITED CODE DNR, no more chest compressions, no ACLS medications, pressors for shock OK for now  - Inter-disciplinary family meet or Palliative Care meeting due by:     CC time by me 60 minutes  Heber [place], MD  [facility] PCCM Pager: [phone] Cell: [phone] If no response, call [phone]    May 26, 2014, 6:26 AM

## 2014-05-15 NOTE — Progress Notes (Signed)
Attempted radial aline x2 but unable to thread catheter.  Pt refuses any more attempts at this time.  RN aware.  Dr Deterding notified and may call anesthesia for placement later if still needed.

## 2014-05-15 NOTE — Progress Notes (Signed)
CRITICAL VALUE ALERT  Critical value received:  Troponin   Date of notification: 04/24/2014  Time of notification:  0029  Critical value read back:Yes.    Nurse who received alert:  TBenjamin,RN   MD notified (1st page):  Dr. Roselyn Reefederdine  Time of first page:  0033  MD notified (2nd page):  Time of second page:  Responding MD:  Dr. Roselyn Reefederdine  Time MD responded:  [phone]

## 2014-05-15 NOTE — Consult Note (Signed)
Reason for Consult:  Acute cholecystitis Referring Physician:  Dr. Adele Schilder is an 81 y.o. female.  HPI: she was admitted to the hospital 2 days ago because of increasing shortness of breath. She also complained of increasing right back and subscapular pain. She is found to be in rapid A. Fib with elevated troponins. She was noted to have elevated liver function tests. Ultrasound demonstrated thickened gallbladder wall consistent with acute cholecystitis. CT scan of the abdomen also demonstrated this. Early this morning, she had a cardiopulmonary arrest. Currently she is on high ventilator support and high doses of vasoactive agents.  She is sedated.  Past Medical History  Diagnosis Date  . Stroke   . Anemia   . Hyperlipidemia   . Vitamin D deficiency   . Hypothyroid   . Pulmonary hypertension   . Chronic respiratory failure with hypoxia   . Blindness of right eye     Macular degeneration  . Macular degeneration   . Hypertensive heart disease   . Chronic diastolic CHF (congestive heart failure)   . Hypertensive heart disease   . UTI (urinary tract infection)   . Osteoarthritis   . Pleural effusion   . Mitral stenosis and regurgitation   . Chronic kidney disease (CKD), stage IV (severe)   . Hypothyroidism  04/16/2012  . History of CVA (cerebrovascular accident)   . Anemia of renal disease 09/08/2012    Past Surgical History  Procedure Laterality Date  . Cataract extraction, bilateral      Family History  Problem Relation Age of Onset  . Heart disease Mother   . Heart disease Father   . Heart disease Brother   . Breast cancer Daughter   . Lymphoma Brother     Social History:  reports that she quit smoking about 35 years ago. Her smoking use included Cigarettes. She has a 25 pack-year smoking history. She has never used smokeless tobacco. She reports that she does not drink alcohol or use illicit drugs.   Prior to Admission medications   Medication Sig Start Date  End Date Taking? Authorizing Provider  beta carotene w/minerals (OCUVITE) tablet Take 1 tablet by mouth 2 (two) times daily.    Yes Historical Provider, MD  calcium carbonate 1250 MG capsule Take 1,250 mg by mouth 2 (two) times daily with a meal.   Yes Historical Provider, MD  cholecalciferol (VITAMIN D) 1000 UNITS tablet Take 1,000 Units by mouth every morning.    Yes Historical Provider, MD  diltiazem (CARDIZEM CD) 180 MG 24 hr capsule Take 180 mg by mouth daily.   Yes Historical Provider, MD  FeFum-FePoly-FA-B Cmp-C-Biot (FOLIVANE-PLUS) CAPS TAKE 1 CAPSULE BY MOUTH ONCE DAILY WITH DINNER 12/26/13  Yes Elayne Snare, MD  fluocinonide cream (LIDEX) 9.50 % Apply 1 application topically 2 (two) times daily. To legs   Yes Historical Provider, MD  furosemide (LASIX) 40 MG tablet TAKE 1 TABLET BY MOUTH TWICE DAILY 09/22/13  Yes Elayne Snare, MD  glucosamine-chondroitin 500-400 MG tablet Take 1 tablet by mouth 2 (two) times daily.    Yes Historical Provider, MD  HYDROcodone-acetaminophen (NORCO) 10-325 MG per tablet Take 1 tablet by mouth 4 (four) times daily as needed for severe pain.   Yes Historical Provider, MD  levothyroxine (SYNTHROID, LEVOTHROID) 150 MCG tablet TAKE 1 TABLET BY MOUTH EVERY MORNING ON AN EMPTY STOMACH. 09/22/13  Yes Elayne Snare, MD  Melatonin 3 MG CAPS Take 1 capsule by mouth as needed (sleep).  Yes Historical Provider, MD  meloxicam (MOBIC) 7.5 MG tablet Take 1 tablet by mouth daily.   Yes Historical Provider, MD  Multiple Vitamin (MULITIVITAMIN WITH MINERALS) TABS Take 1 tablet by mouth every morning.    Yes Historical Provider, MD  omeprazole (PRILOSEC) 40 MG capsule TAKE 1 CAPSULE BY MOUTH ONCE DAILY 09/22/13  Yes Elayne Snare, MD  simvastatin (ZOCOR) 10 MG tablet Take 10 mg by mouth at bedtime.   Yes Historical Provider, MD  spironolactone (ALDACTONE) 25 MG tablet Take 1 tablet (25 mg total) by mouth daily. 03/26/14  Yes Tanda Rockers, MD  desoximetasone (TOPICORT) 0.05 % cream APPLY  TO AFFECTED AREA(S) ONCE DAILY AS DIRECTED 12/29/13   Elayne Snare, MD  traMADol (ULTRAM) 50 MG tablet TAKE 1 TABLET BY MOUTH 3 TIMES DAILY AS NEEDED FOR PAIN Patient not taking: Reported on 04/14/2014 09/25/13   Elayne Snare, MD  triamcinolone ointment (KENALOG) 0.5 % Apply 1 application topically 2 (two) times daily. Use as directed Patient not taking: Reported on 04/25/2014 12/30/13   Elayne Snare, MD    Allergies:  Allergies  Allergen Reactions  . Lisinopril Other (See Comments)    pancreatitis     Results for orders placed or performed during the hospital encounter of 05/11/2014 (from the past 48 hour(s))  CBC with Differential     Status: Abnormal   Collection Time: 04/29/2014  6:51 PM  Result Value Ref Range   WBC 7.4 4.0 - 10.5 K/uL   RBC 3.41 (L) 3.87 - 5.11 MIL/uL   Hemoglobin 9.3 (L) 12.0 - 15.0 g/dL   HCT 30.0 (L) 36.0 - 46.0 %   MCV 88.0 78.0 - 100.0 fL   MCH 27.3 26.0 - 34.0 pg   MCHC 31.0 30.0 - 36.0 g/dL   RDW 17.2 (H) 11.5 - 15.5 %   Platelets 378 150 - 400 K/uL   Neutrophils Relative % 75 43 - 77 %   Neutro Abs 5.5 1.7 - 7.7 K/uL   Lymphocytes Relative 12 12 - 46 %   Lymphs Abs 0.9 0.7 - 4.0 K/uL   Monocytes Relative 11 3 - 12 %   Monocytes Absolute 0.8 0.1 - 1.0 K/uL   Eosinophils Relative 2 0 - 5 %   Eosinophils Absolute 0.2 0.0 - 0.7 K/uL   Basophils Relative 0 0 - 1 %   Basophils Absolute 0.0 0.0 - 0.1 K/uL  Comprehensive metabolic panel     Status: Abnormal   Collection Time: 04/29/2014  6:51 PM  Result Value Ref Range   Sodium 137 137 - 147 mEq/L   Potassium 6.2 (H) 3.7 - 5.3 mEq/L    Comment: SLIGHT HEMOLYSIS HEMOLYSIS AT THIS LEVEL MAY AFFECT RESULT    Chloride 96 96 - 112 mEq/L   CO2 25 19 - 32 mEq/L   Glucose, Bld 126 (H) 70 - 99 mg/dL   BUN 79 (H) 6 - 23 mg/dL   Creatinine, Ser 2.69 (H) 0.50 - 1.10 mg/dL   Calcium 9.5 8.4 - 10.5 mg/dL   Total Protein 7.9 6.0 - 8.3 g/dL   Albumin 3.3 (L) 3.5 - 5.2 g/dL   AST 39 (H) 0 - 37 U/L    Comment: SLIGHT  HEMOLYSIS HEMOLYSIS AT THIS LEVEL MAY AFFECT RESULT    ALT 18 0 - 35 U/L    Comment: SLIGHT HEMOLYSIS HEMOLYSIS AT THIS LEVEL MAY AFFECT RESULT    Alkaline Phosphatase 120 (H) 39 - 117 U/L   Total Bilirubin 0.2 (L)  0.3 - 1.2 mg/dL   GFR calc non Af Amer 15 (L) >90 mL/min   GFR calc Af Amer 17 (L) >90 mL/min    Comment: (NOTE) The eGFR has been calculated using the CKD EPI equation. This calculation has not been validated in all clinical situations. eGFR's persistently <90 mL/min signify possible Chronic Kidney Disease.    Anion gap 16 (H) 5 - 15  Troponin I     Status: None   Collection Time: 05/01/2014  6:51 PM  Result Value Ref Range   Troponin I <0.30 <0.30 ng/mL    Comment:        Due to the release kinetics of cTnI, a negative result within the first hours of the onset of symptoms does not rule out myocardial infarction with certainty. If myocardial infarction is still suspected, repeat the test at appropriate intervals.   Pro b natriuretic peptide (BNP)     Status: Abnormal   Collection Time: 04/30/2014  6:51 PM  Result Value Ref Range   Pro B Natriuretic peptide (BNP) 15788.0 (H) 0 - 450 pg/mL  Urinalysis, Routine w reflex microscopic     Status: Abnormal   Collection Time: 04/20/2014  6:58 PM  Result Value Ref Range   Color, Urine YELLOW YELLOW   APPearance CLEAR CLEAR   Specific Gravity, Urine 1.015 1.005 - 1.030   pH 5.0 5.0 - 8.0   Glucose, UA NEGATIVE NEGATIVE mg/dL   Hgb urine dipstick NEGATIVE NEGATIVE   Bilirubin Urine NEGATIVE NEGATIVE   Ketones, ur NEGATIVE NEGATIVE mg/dL   Protein, ur NEGATIVE NEGATIVE mg/dL   Urobilinogen, UA 0.2 0.0 - 1.0 mg/dL   Nitrite NEGATIVE NEGATIVE   Leukocytes, UA SMALL (A) NEGATIVE  Urine microscopic-add on     Status: Abnormal   Collection Time: 04/22/2014  6:58 PM  Result Value Ref Range   Squamous Epithelial / LPF RARE RARE   WBC, UA 0-2 <3 WBC/hpf   Casts HYALINE CASTS (A) NEGATIVE  Potassium     Status: Abnormal    Collection Time: 05/02/2014  7:54 PM  Result Value Ref Range   Potassium 5.7 (H) 3.7 - 5.3 mEq/L  D-dimer, quantitative     Status: Abnormal   Collection Time: 05/07/2014  9:21 PM  Result Value Ref Range   D-Dimer, Quant 3.57 (H) 0.00 - 0.48 ug/mL-FEU    Comment:        AT THE INHOUSE ESTABLISHED CUTOFF VALUE OF 0.48 ug/mL FEU, THIS ASSAY HAS BEEN DOCUMENTED IN THE LITERATURE TO HAVE A SENSITIVITY AND NEGATIVE PREDICTIVE VALUE OF AT LEAST 98 TO 99%.  THE TEST RESULT SHOULD BE CORRELATED WITH AN ASSESSMENT OF THE CLINICAL PROBABILITY OF DVT / VTE.   TSH     Status: None   Collection Time: 05/01/2014  9:22 PM  Result Value Ref Range   TSH 3.350 0.350 - 4.500 uIU/mL    Comment: Performed at [facility]  Troponin I (q 6hr x 3)     Status: None   Collection Time: 04/27/14  1:42 AM  Result Value Ref Range   Troponin I <0.30 <0.30 ng/mL    Comment:        Due to the release kinetics of cTnI, a negative result within the first hours of the onset of symptoms does not rule out myocardial infarction with certainty. If myocardial infarction is still suspected, repeat the test at appropriate intervals.   Protime-INR     Status: None   Collection Time: 04/27/14  1:42 AM  Result Value Ref Range   Prothrombin Time 13.5 11.6 - 15.2 seconds   INR 1.02 0.00 - 1.49  APTT     Status: None   Collection Time: 04/27/14  1:42 AM  Result Value Ref Range   aPTT 32 24 - 37 seconds  Procalcitonin - Baseline     Status: None   Collection Time: 04/27/14  1:42 AM  Result Value Ref Range   Procalcitonin 0.35 ng/mL    Comment:        Interpretation: PCT (Procalcitonin) <= 0.5 ng/mL: Systemic infection (sepsis) is not likely. Local bacterial infection is possible. (NOTE)         ICU PCT Algorithm               Non ICU PCT Algorithm    ----------------------------     ------------------------------         PCT < 0.25 ng/mL                 PCT < 0.1 ng/mL     Stopping of antibiotics             Stopping of antibiotics       strongly encouraged.               strongly encouraged.    ----------------------------     ------------------------------       PCT level decrease by               PCT < 0.25 ng/mL       >= 80% from peak PCT       OR PCT 0.25 - 0.5 ng/mL          Stopping of antibiotics                                             encouraged.     Stopping of antibiotics           encouraged.    ----------------------------     ------------------------------       PCT level decrease by              PCT >= 0.25 ng/mL       < 80% from peak PCT        AND PCT >= 0.5 ng/mL            Continuin g antibiotics                                              encouraged.       Continuing antibiotics            encouraged.    ----------------------------     ------------------------------     PCT level increase compared          PCT > 0.5 ng/mL         with peak PCT AND          PCT >= 0.5 ng/mL             Escalation of antibiotics  strongly encouraged.      Escalation of antibiotics        strongly encouraged.   Comprehensive metabolic panel     Status: Abnormal   Collection Time: 04/27/14  3:33 AM  Result Value Ref Range   Sodium 140 137 - 147 mEq/L   Potassium 4.6 3.7 - 5.3 mEq/L    Comment: DELTA CHECK NOTED REPEATED TO VERIFY    Chloride 98 96 - 112 mEq/L   CO2 28 19 - 32 mEq/L   Glucose, Bld 157 (H) 70 - 99 mg/dL   BUN 73 (H) 6 - 23 mg/dL   Creatinine, Ser 2.57 (H) 0.50 - 1.10 mg/dL   Calcium 9.5 8.4 - 10.5 mg/dL   Total Protein 7.5 6.0 - 8.3 g/dL   Albumin 3.2 (L) 3.5 - 5.2 g/dL   AST 20 0 - 37 U/L   ALT 14 0 - 35 U/L   Alkaline Phosphatase 114 39 - 117 U/L   Total Bilirubin 0.2 (L) 0.3 - 1.2 mg/dL   GFR calc non Af Amer 16 (L) >90 mL/min   GFR calc Af Amer 18 (L) >90 mL/min    Comment: (NOTE) The eGFR has been calculated using the CKD EPI equation. This calculation has not been validated in all clinical  situations. eGFR's persistently <90 mL/min signify possible Chronic Kidney Disease.    Anion gap 14 5 - 15  CBC with Differential     Status: Abnormal   Collection Time: 04/27/14  3:33 AM  Result Value Ref Range   WBC 7.1 4.0 - 10.5 K/uL   RBC 3.45 (L) 3.87 - 5.11 MIL/uL   Hemoglobin 9.4 (L) 12.0 - 15.0 g/dL   HCT 30.6 (L) 36.0 - 46.0 %   MCV 88.7 78.0 - 100.0 fL   MCH 27.2 26.0 - 34.0 pg   MCHC 30.7 30.0 - 36.0 g/dL   RDW 17.2 (H) 11.5 - 15.5 %   Platelets 359 150 - 400 K/uL   Neutrophils Relative % 76 43 - 77 %   Neutro Abs 5.4 1.7 - 7.7 K/uL   Lymphocytes Relative 9 (L) 12 - 46 %   Lymphs Abs 0.7 0.7 - 4.0 K/uL   Monocytes Relative 11 3 - 12 %   Monocytes Absolute 0.8 0.1 - 1.0 K/uL   Eosinophils Relative 3 0 - 5 %   Eosinophils Absolute 0.2 0.0 - 0.7 K/uL   Basophils Relative 1 0 - 1 %   Basophils Absolute 0.0 0.0 - 0.1 K/uL  Sodium, urine, random     Status: None   Collection Time: 04/27/14  4:00 AM  Result Value Ref Range   Sodium, Ur 103 mEq/L    Comment: Performed at [facility]  MRSA PCR Screening     Status: None   Collection Time: 04/27/14  4:12 AM  Result Value Ref Range   MRSA by PCR NEGATIVE NEGATIVE    Comment:        The GeneXpert MRSA Assay (FDA approved for NASAL specimens only), is one component of a comprehensive MRSA colonization surveillance program. It is not intended to diagnose MRSA infection nor to guide or monitor treatment for MRSA infections.   Troponin I (q 6hr x 3)     Status: None   Collection Time: 04/27/14  6:50 AM  Result Value Ref Range   Troponin I <0.30 <0.30 ng/mL    Comment:        Due to the release kinetics of cTnI,  a negative result within the first hours of the onset of symptoms does not rule out myocardial infarction with certainty. If myocardial infarction is still suspected, repeat the test at appropriate intervals.   Lactic acid, plasma     Status: None   Collection Time: 04/27/14  6:50 AM  Result  Value Ref Range   Lactic Acid, Venous 1.1 0.5 - 2.2 mmol/L  Troponin I (q 6hr x 3)     Status: None   Collection Time: 04/27/14 12:16 PM  Result Value Ref Range   Troponin I <0.30 <0.30 ng/mL    Comment:        Due to the release kinetics of cTnI, a negative result within the first hours of the onset of symptoms does not rule out myocardial infarction with certainty. If myocardial infarction is still suspected, repeat the test at appropriate intervals.   Heparin level (unfractionated)     Status: None   Collection Time: 04/27/14 12:16 PM  Result Value Ref Range   Heparin Unfractionated 0.55 0.30 - 0.70 IU/mL    Comment:        IF HEPARIN RESULTS ARE BELOW EXPECTED VALUES, AND PATIENT DOSAGE HAS BEEN CONFIRMED, SUGGEST FOLLOW UP TESTING OF ANTITHROMBIN III LEVELS.   Cortisol     Status: None   Collection Time: 04/27/14  2:00 PM  Result Value Ref Range   Cortisol, Plasma 33.4 ug/dL    Comment: (NOTE) AM:  4.3 - 22.4 ug/dL PM:  3.1 - 16.7 ug/dL Performed at [facility]   CBC     Status: Abnormal   Collection Time: 04/27/14 11:00 PM  Result Value Ref Range   WBC 9.3 4.0 - 10.5 K/uL   RBC 3.35 (L) 3.87 - 5.11 MIL/uL   Hemoglobin 9.2 (L) 12.0 - 15.0 g/dL   HCT 29.9 (L) 36.0 - 46.0 %   MCV 89.3 78.0 - 100.0 fL   MCH 27.5 26.0 - 34.0 pg   MCHC 30.8 30.0 - 36.0 g/dL   RDW 17.4 (H) 11.5 - 15.5 %   Platelets 399 150 - 400 K/uL  Comprehensive metabolic panel     Status: Abnormal   Collection Time: 04/27/14 11:00 PM  Result Value Ref Range   Sodium 134 (L) 137 - 147 mEq/L   Potassium 6.0 (H) 3.7 - 5.3 mEq/L    Comment: DELTA CHECK NOTED REPEATED TO VERIFY NO VISIBLE HEMOLYSIS    Chloride 94 (L) 96 - 112 mEq/L   CO2 22 19 - 32 mEq/L   Glucose, Bld 193 (H) 70 - 99 mg/dL   BUN 70 (H) 6 - 23 mg/dL   Creatinine, Ser 2.84 (H) 0.50 - 1.10 mg/dL   Calcium 8.6 8.4 - 10.5 mg/dL   Total Protein 7.0 6.0 - 8.3 g/dL   Albumin 2.8 (L) 3.5 - 5.2 g/dL   AST 511 (H) 0 - 37  U/L   ALT 462 (H) 0 - 35 U/L   Alkaline Phosphatase 186 (H) 39 - 117 U/L   Total Bilirubin 0.4 0.3 - 1.2 mg/dL   GFR calc non Af Amer 14 (L) >90 mL/min   GFR calc Af Amer 16 (L) >90 mL/min    Comment: (NOTE) The eGFR has been calculated using the CKD EPI equation. This calculation has not been validated in all clinical situations. eGFR's persistently <90 mL/min signify possible Chronic Kidney Disease.    Anion gap 18 (H) 5 - 15  Lactic acid, plasma     Status: Abnormal  Collection Time: 04/27/14 11:00 PM  Result Value Ref Range   Lactic Acid, Venous 2.7 (H) 0.5 - 2.2 mmol/L  Troponin I     Status: Abnormal   Collection Time: 04/27/14 11:00 PM  Result Value Ref Range   Troponin I 0.31 (HH) <0.30 ng/mL    Comment:        Due to the release kinetics of cTnI, a negative result within the first hours of the onset of symptoms does not rule out myocardial infarction with certainty. If myocardial infarction is still suspected, repeat the test at appropriate intervals. CRITICAL RESULT CALLED TO, READ BACK BY AND VERIFIED WITH: BENJAMIN,T/2W @0030  ON 05-21-2014 BY KARCZEWSKI,S.   Blood gas, arterial     Status: Abnormal   Collection Time: 04/27/14 11:05 PM  Result Value Ref Range   O2 Content 3.0 L/min   Delivery systems NASAL CANNULA    pH, Arterial 7.310 (L) 7.350 - 7.450   pCO2 arterial 42.2 35.0 - 45.0 mmHg   pO2, Arterial 70.6 (L) 80.0 - 100.0 mmHg   Bicarbonate 20.8 20.0 - 24.0 mEq/L   TCO2 19.7 0 - 100 mmol/L   Acid-base deficit 4.8 (H) 0.0 - 2.0 mmol/L   O2 Saturation 91.3 %   Patient temperature 97.6    Collection site LEFT RADIAL    Drawn by [id number]    Sample type ARTERIAL    Allens test (pass/fail) PASS PASS  Lipase, blood     Status: None   Collection Time: 05/21/14  1:30 AM  Result Value Ref Range   Lipase 39 11 - 59 U/L  CBC     Status: Abnormal   Collection Time: 2014/05/21  5:00 AM  Result Value Ref Range   WBC 15.3 (H) 4.0 - 10.5 K/uL   RBC 3.59 (L) 3.87 -  5.11 MIL/uL   Hemoglobin 9.9 (L) 12.0 - 15.0 g/dL   HCT 31.6 (L) 36.0 - 46.0 %   MCV 88.0 78.0 - 100.0 fL   MCH 27.6 26.0 - 34.0 pg   MCHC 31.3 30.0 - 36.0 g/dL   RDW 17.3 (H) 11.5 - 15.5 %   Platelets 312 150 - 400 K/uL  Basic metabolic panel     Status: Abnormal   Collection Time: May 21, 2014  5:00 AM  Result Value Ref Range   Sodium 132 (L) 137 - 147 mEq/L   Potassium 6.2 (H) 3.7 - 5.3 mEq/L   Chloride 91 (L) 96 - 112 mEq/L   CO2 18 (L) 19 - 32 mEq/L   Glucose, Bld 252 (H) 70 - 99 mg/dL   BUN 69 (H) 6 - 23 mg/dL   Creatinine, Ser 3.16 (H) 0.50 - 1.10 mg/dL   Calcium 8.6 8.4 - 10.5 mg/dL   GFR calc non Af Amer 12 (L) >90 mL/min   GFR calc Af Amer 14 (L) >90 mL/min    Comment: (NOTE) The eGFR has been calculated using the CKD EPI equation. This calculation has not been validated in all clinical situations. eGFR's persistently <90 mL/min signify possible Chronic Kidney Disease.    Anion gap 23 (H) 5 - 15    Comment: RESULT CHECKED  Protime-INR     Status: Abnormal   Collection Time: May 21, 2014  5:00 AM  Result Value Ref Range   Prothrombin Time 17.6 (H) 11.6 - 15.2 seconds   INR 1.43 0.00 - 1.49  Troponin I (q 6hr x 3)     Status: Abnormal   Collection Time: 05-21-14  5:00 AM  Result Value Ref Range   Troponin I 1.11 (HH) <0.30 ng/mL    Comment:        Due to the release kinetics of cTnI, a negative result within the first hours of the onset of symptoms does not rule out myocardial infarction with certainty. If myocardial infarction is still suspected, repeat the test at appropriate intervals. CRITICAL VALUE NOTED.  VALUE IS CONSISTENT WITH PREVIOUSLY REPORTED AND CALLED VALUE.   Blood gas, arterial     Status: Abnormal   Collection Time: 2014/05/09  5:37 AM  Result Value Ref Range   FIO2 1.00 %   Delivery systems MANUAL VENTILATION    pH, Arterial 7.212 (L) 7.350 - 7.450   pCO2 arterial 52.5 (H) 35.0 - 45.0 mmHg   pO2, Arterial 183.0 (H) 80.0 - 100.0 mmHg    Bicarbonate 20.3 20.0 - 24.0 mEq/L   TCO2 20.0 0 - 100 mmol/L   Acid-base deficit 6.9 (H) 0.0 - 2.0 mmol/L   O2 Saturation 97.8 %   Patient temperature 98.3    Collection site ARTERIAL LINE    Drawn by [phone]    Sample type ARTERIAL    Allens test (pass/fail) PASS PASS  Blood gas, arterial     Status: Abnormal   Collection Time: 05/09/2014  6:50 AM  Result Value Ref Range   FIO2 1.00 %   Delivery systems VENTILATOR    Mode PRESSURE REGULATED VOLUME CONTROL    VT 450 mL   Rate 28 resp/min   Peep/cpap 5.0 cm H20   pH, Arterial 7.325 (L) 7.350 - 7.450   pCO2 arterial 36.4 35.0 - 45.0 mmHg   pO2, Arterial 196.0 (H) 80.0 - 100.0 mmHg   Bicarbonate 18.6 (L) 20.0 - 24.0 mEq/L   TCO2 17.8 0 - 100 mmol/L   Acid-base deficit 6.5 (H) 0.0 - 2.0 mmol/L   O2 Saturation 98.2 %   Patient temperature 97.5    Collection site A-LINE    Drawn by [phone]    Sample type ARTERIAL    Allens test (pass/fail) PASS PASS    Ct Abdomen Pelvis Wo Contrast  05-09-2014   CLINICAL DATA:  Right lower quadrant abdominal pain  EXAM: CT ABDOMEN AND PELVIS WITHOUT CONTRAST  TECHNIQUE: Multidetector CT imaging of the abdomen and pelvis was performed following the standard protocol without IV contrast.  COMPARISON:  11/02/2009  FINDINGS: BODY WALL: Unremarkable.  LOWER CHEST: Cardiomegaly. Diffuse coronary atherosclerosis. There is aortic valvular and mitral annular calcification.  Small bilateral pleural effusions with extensive pleural thickening on the right. Rounded opacity in the right lower lobe could reflect pneumonia or round atelectasis.  ABDOMEN/PELVIS:  Liver: No focal abnormality.  Biliary: Distended gallbladder with increased luminal density. There is pericholecystic edema and wall thickening. No calcified gallstone is identified.  Pancreas: Unremarkable.  Spleen: Unremarkable.  Adrenals: Unremarkable.  Kidneys and ureters: Bilateral renal atrophy.  No hydronephrosis.  Bladder: Decompressed by Foley catheter.   Reproductive: Unremarkable.  Bowel: Noninflamed colonic diverticula. No bowel obstruction. No pericecal inflammatory change.  Retroperitoneum: No mass or adenopathy.  Peritoneum: Small volume ascites present within the pelvis and around the liver. No fluid accumulation around the gallbladder suggestive of gallbladder perforation  Vascular: Extensive atheromatous changes. No evidence of acute abnormality.  OSSEOUS: No acute abnormalities.  IMPRESSION: 1. Gallbladder distention and wall thickening which suggests acute cholecystitis. Reference abdominal ultrasound from yesterday. 2. Complex right pleural effusion. Although empyema can have this appearance, the effusion has been present since at least 2013.  Dense opacification in the right lower lobe could reflect pneumonia or round atelectasis. 3. Small ascites.   Electronically Signed   By: Jorje Guild M.D.   On: 05/13/14 02:46   Ct Chest Wo Contrast  04/30/2014   CLINICAL DATA:  Increasing shortness of breath, elevated heart rate for 2 days, renal insufficiency with known right pleural effusion  EXAM: CT CHEST WITHOUT CONTRAST  TECHNIQUE: Multidetector CT imaging of the chest was performed following the standard protocol without IV contrast.  COMPARISON:  Chest radiograph 04/25/2014, chest CT 04/15/2012  FINDINGS: There is a small right pleural effusion at the right lung base. It shows a thick rim suggesting that it is at least partially loculated. Consolidation in the right lower lobe measuring 6 x 4 cm.  Irregular apical pleural thickening right greater than left, mild bilaterally, stable from 2013. There are multiple bands of well-defined linear opacity throughout the anterior right lower lobe and right middle lobe consistent with discoid atelectasis.  On the left, there is atelectasis or scarring in the inferior lingula and in the lung base. There is a very small left pleural effusion.  There is patchy bilateral ground-glass attenuation. There are  increased bilateral perihilar markings. There is mild diffuse interstitial prominence.  There is calcification of the aorta. There are numerous nonpathologic mediastinal lymph nodes. There is heavy coronary arterial calcification and mitral valve calcification. There is also milder aortic valve calcification.  There are no acute musculoskeletal findings. Pre images through the upper abdomen are unremarkable.  IMPRESSION: Small bilateral pleural effusions right larger than left. The right effusion demonstrates an appearance suggesting loculation.  There is moderately extensive consolidation in the right lower lobe. Air bronchograms are seen through much of this area. This could represent atelectasis or pneumonia.  Nonspecific ground-glass attenuation bilaterally possibly indicative of pulmonary edema. There does appear to be mild interstitial pulmonary edema.   Electronically Signed   By: Skipper Cliche M.D.   On: 05/06/2014 22:01   US Abdomen Complete  04/27/2014   CLINICAL DATA:  Right upper quadrant abdominal pain.  EXAM: ULTRASOUND ABDOMEN COMPLETE  COMPARISON:  CT 11/02/2009  FINDINGS: Gallbladder: The gallbladder is distended with an edematous wall and pericholecystic fluid. Wall thickening roughly measures up to the 0.9 cm and appears to be asymmetric. No evidence for gallstones. Patient reportedly does not have a sonographic Murphy's sign.  Common bile duct: Diameter: 0.2 cm.  Liver: Normal echogenicity without focal lesions. There is a small amount of perihepatic ascites.  IVC: No abnormality visualized.  Pancreas: Visualized portion unremarkable.  Spleen: Size and appearance within normal limits.  Right Kidney: Length: 9.1 cm. Negative for hydronephrosis. No suspicious renal lesions.  Left Kidney: Length: 9.7 cm. Normal echogenicity with mild cortical thinning. Negative for hydronephrosis.  Abdominal aorta: No aneurysm visualized.  Other findings: Left pleural effusion.  IMPRESSION: Distended  gallbladder with gallbladder wall thickening and pericholecystic fluid. There is also a small amount of perihepatic ascites. Findings are concerning for acalculous cholecystitis. The wall thickening and/or pericholecystic fluid is asymmetric and CT imaging may be useful to exclude additional fluid collections.  These results were called by telephone at the time of interpretation on 04/27/2014 at 3:12 pm to [facility] , who verbally acknowledged these results.   Electronically Signed   By: Markus Daft M.D.   On: 04/27/2014 15:13   Nm Pulmonary Perf And Vent  04/27/2014   CLINICAL DATA:  Shortness of breath and back pain.  EXAM: NUCLEAR MEDICINE VENTILATION -  PERFUSION LUNG SCAN  TECHNIQUE: Ventilation images were obtained in multiple projections using inhaled aerosol technetium 99 M DTPA. Perfusion images were obtained in multiple projections after intravenous injection of Tc-62m MAA.  RADIOPHARMACEUTICALS:  45.0 mCi Tc-68m DTPA aerosol and 5.1 mCi Tc-87m MAA  COMPARISON:  Chest x-ray 04/30/2014 and noncontrast chest CT 04/30/2014  FINDINGS: Ventilation: Diffuse bilateral heterogeneous uptake. Focal increased uptake over the central left bronchi. Matched to defect over the posterior right base compatible known effusion. Mass small focal decreased uptake over the posterior basilar left lower lobe.  Perfusion: Mild nonsegmental decreased uptake over the posterior right base compatible patient's known the fusion. Small peripheral wedge-shaped perfusion defect over the posterior basilar segment of the left lower lobe.  IMPRESSION: Findings compatible with very low probability for pulmonary embolism.   Electronically Signed   By: Marin Olp M.D.   On: 04/27/2014 12:34   Dg Chest Port 1 View  07-May-2014   CLINICAL DATA:  Check endotracheal tube placement  EXAM: PORTABLE CHEST - 1 VIEW  COMPARISON:  04/27/2014  FINDINGS: An endotracheal tube is now noted 7.8 cm above the carina. Nasogastric catheter is seen  within the stomach. A right-sided PICC line is stable in appearance. Cardiac shadow is stable. The left lung remains clear. Chronic changes in the right lung base are again noted.  IMPRESSION: Tubes and lines as described above.  Stable chronic changes on the right.   Electronically Signed   By: Inez Catalina M.D.   On: 05/07/2014 07:05   Dg Chest Port 1 View  04/27/2014   CLINICAL DATA:  PICC line placement  EXAM: PORTABLE CHEST - 1 VIEW  COMPARISON:  CT chest 05/05/2014  FINDINGS: Right-sided PICC line with the tip projecting over the SVC.  Bilateral small pleural effusions, right greater than left. Right basilar airspace disease likely reflecting atelectasis versus pneumonia. Mild bilateral interstitial thickening. No pneumothorax. Stable cardiomediastinal silhouette. No acute osseous abnormality.  IMPRESSION: 1. Right-sided PICC line with the tip projecting over the SVC. 2. Bilateral pleural effusions, right greater than left, with mild bilateral interstitial thickening concerning for mild pulmonary edema.   Electronically Signed   By: Kathreen Devoid   On: 04/27/2014 18:59   Dg Chest Port 1 View  05/01/2014   CLINICAL DATA:  Shortness of breath with weakness. Initial encounter.  EXAM: PORTABLE CHEST - 1 VIEW  COMPARISON:  03/26/2014 and 01/12/2014 radiographs.  FINDINGS: 1857 hr. There is mild patient rotation to the right. Chronic loculated right pleural effusion and adjacent parenchymal opacity are stable. The pulmonary vasculature is less distinct with increased interstitial prominence suspicious for mild edema. There is probable mild left basilar atelectasis. No acute osseous findings evident.  IMPRESSION: Ill-defined pulmonary vasculature with increased interstitial prominence suspicious for mild edema/congestive heart failure. Stable loculated right pleural effusion.   Electronically Signed   By: Camie Patience M.D.   On: 04/24/2014 19:14    Review of Systems  Unable to perform ROS: critical illness    Blood pressure 140/60, pulse 86, temperature 98.3 F (36.8 C), temperature source Oral, resp. rate 29, height $RemoveBe'5\' 5"'sPDxYblMw$  (1.651 m), weight 162 lb 11.2 oz (73.8 kg), SpO2 100 %. Physical Exam  Constitutional:  Elderly female on the ventilator. She is not responsive at this time area  HENT:  ETT in  Eyes: No scleral icterus.  GI: Soft. She exhibits no mass.  Skin:  Pale and cool.    Assessment/Plan: 1. Acute acalculous cholecystitis. She is not an operative  candidate.  2. Cardiopulmonary arrest earlier this AM  3.  Progressive acute renal failure.  4.  Numerous significant comorbidities.  Recommendation: If she stabilizes out, I would recommend a percutaneous cholecystostomy. Also recommend broad-spectrum intravenous antibiotics. She is not an operative candidate. If she does get the percutaneous cholecystostomy, it would need to stay in 6-8 weeks. Please call us back we can be of further assistance.  Kayra Crowell J May 25, 2014, 7:58 AM

## 2014-05-15 NOTE — Progress Notes (Signed)
Patient with no spont respirations, no BP, no pulse present.  Pronounced per two RN's Zorana Brockwell Debroah LoopArnold RN and Harvest DarkShae Shaffer RN. Family and chaplain at bedside and aware that patient has expired.  Karletta Millay Debroah LoopArnold RN

## 2014-05-15 DEATH — deceased

## 2014-05-29 ENCOUNTER — Ambulatory Visit: Payer: Medicare Other | Admitting: Internal Medicine

## 2014-10-12 IMAGING — CT CT CHEST W/O CM
3 of 5 series · 17 of 46 positions shown, 19 images · non-contrast
Comparison: Chest CT dated 11/13/2008 and chest radiographs
obtained earlier today.

CLINICAL DATA: Cough and shortness of breath.  Right pleural
effusion.  CHF.

CT CHEST WITHOUT CONTRAST
TECHNIQUE: Multidetector CT imaging of the chest was performed
following the standard protocol without IV contrast.

[Series 2: cap w/o w/o st · axial · non-contrast · 0.79mm/px · z∈[-426,-136]mm · 12 of 70 slices shown, 14 images (1 of 2)]
[im 6/70  soft-tissue]
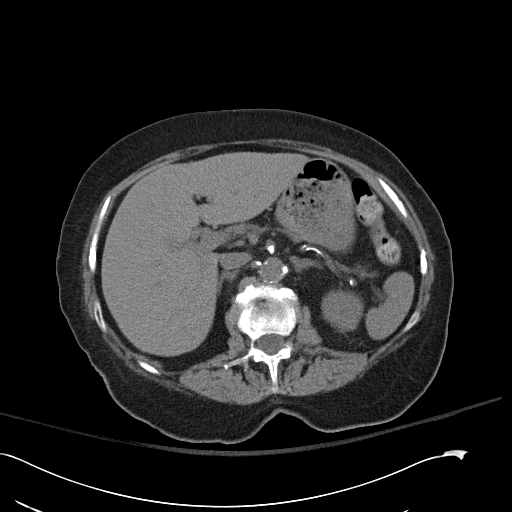
[im 6/70  bone]
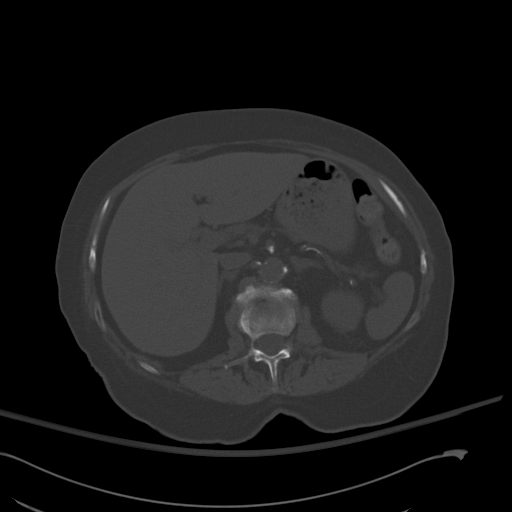
[im 11/70  soft-tissue]
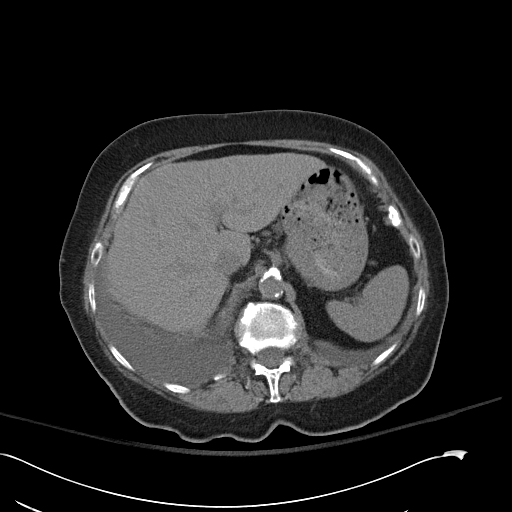
[im 16/70  soft-tissue]
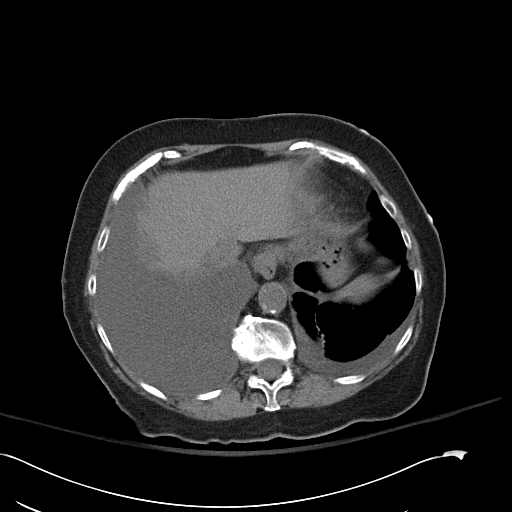
[im 22/70  soft-tissue]
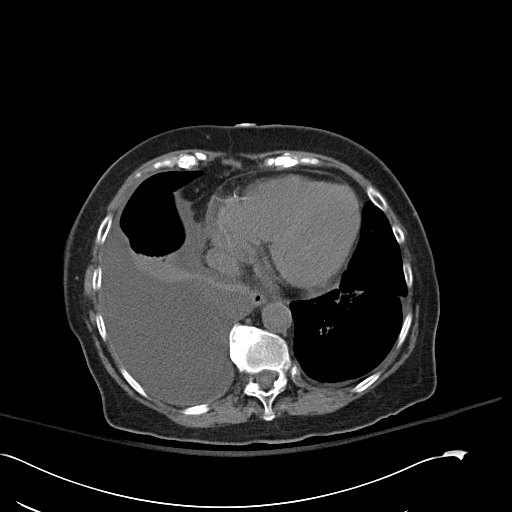
[im 27/70  soft-tissue]
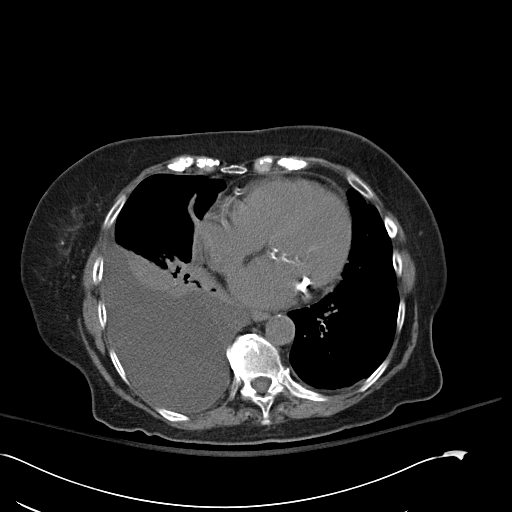
[im 32/70  soft-tissue]
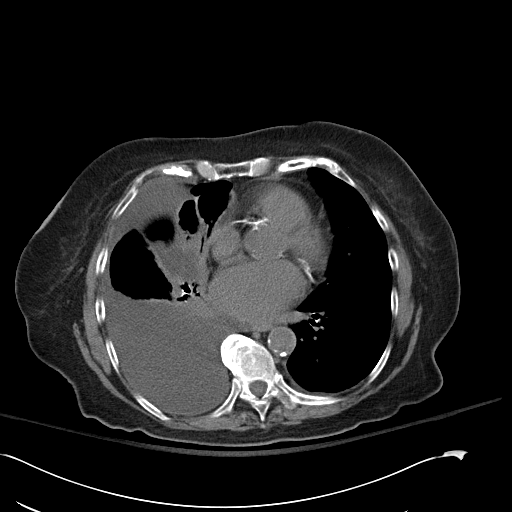
[im 38/70  soft-tissue]
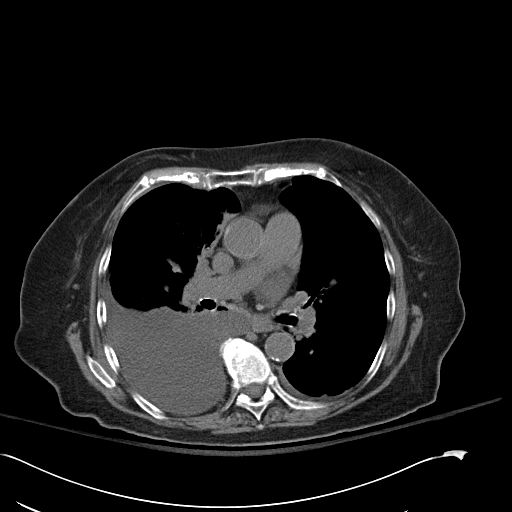
[im 43/70  soft-tissue]
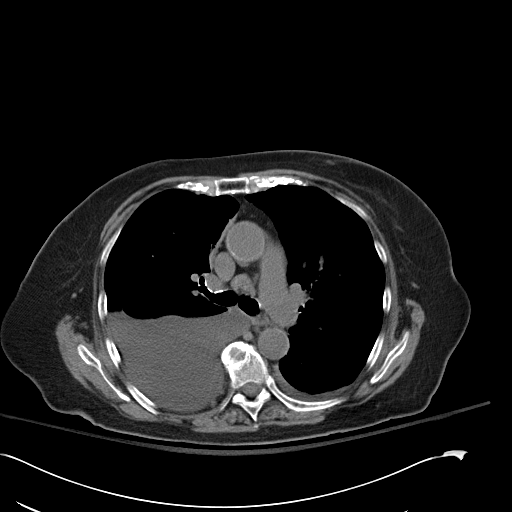
[im 48/70  soft-tissue]
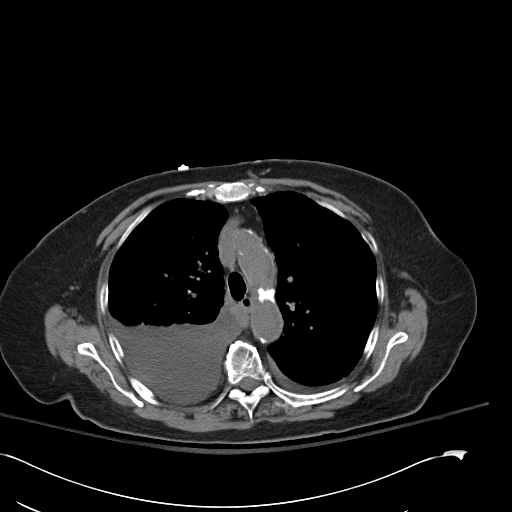
[im 48/70  bone]
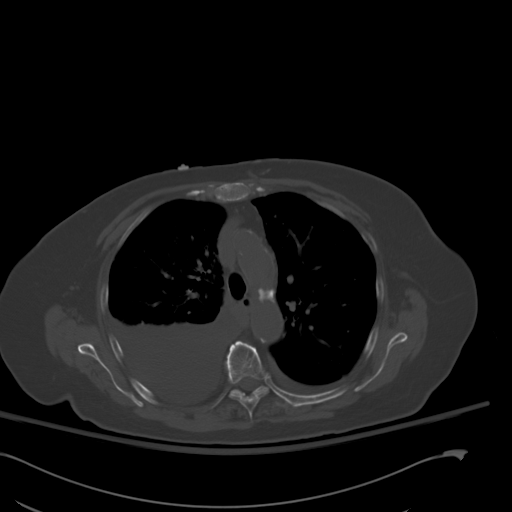
[im 54/70  soft-tissue]
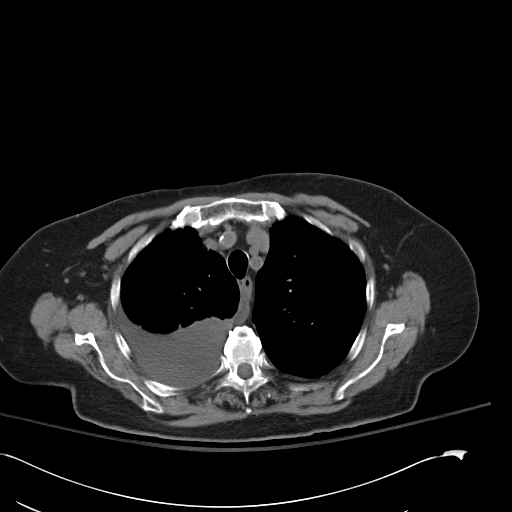
[im 59/70  soft-tissue]
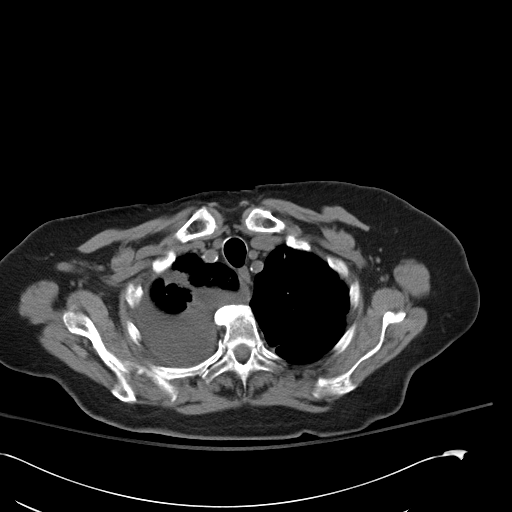
[im 64/70  soft-tissue]
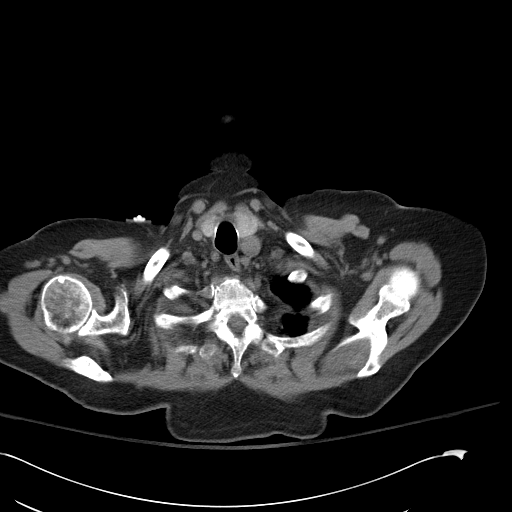

[Series 3: cap w/o w/o st · axial · non-contrast · 0.79mm/px · z∈[-498,-468]mm · 2 of 20 slices shown (2 of 2)]
[im 7/20  soft-tissue]
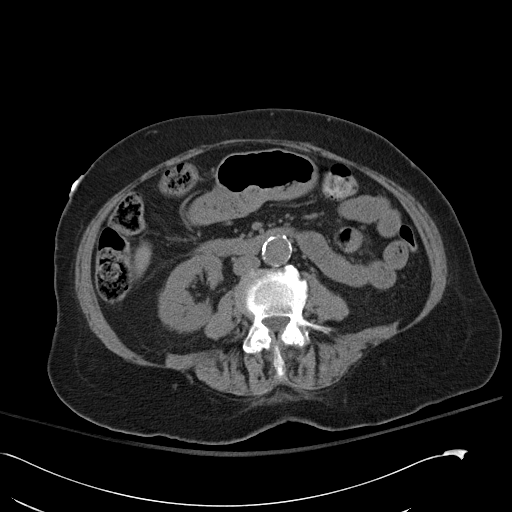
[im 13/20  soft-tissue]
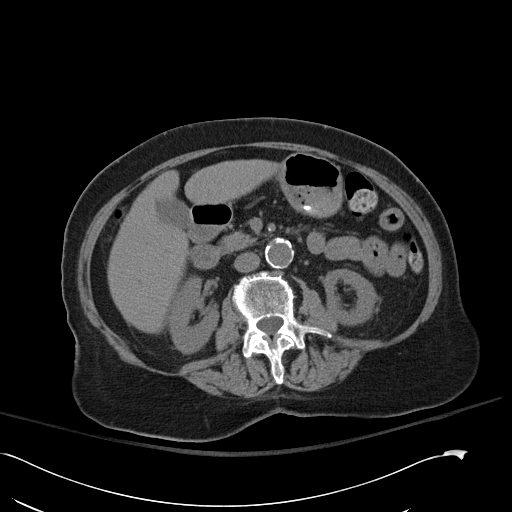

[Series 602: coronals · coronal · 0.79mm/px · 3 of 109 slices shown]
[im 37/109  soft-tissue]
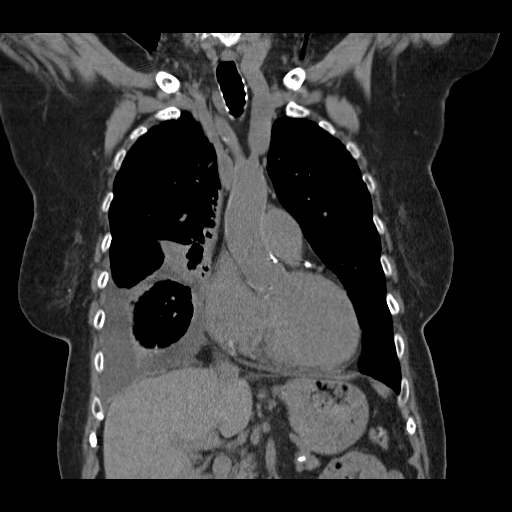
[im 49/109  soft-tissue]
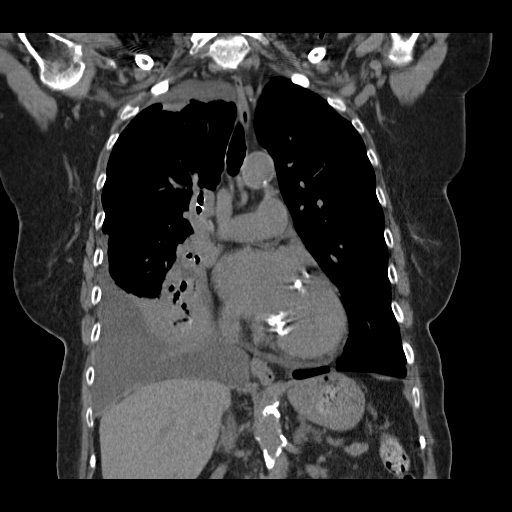
[im 61/109  soft-tissue]
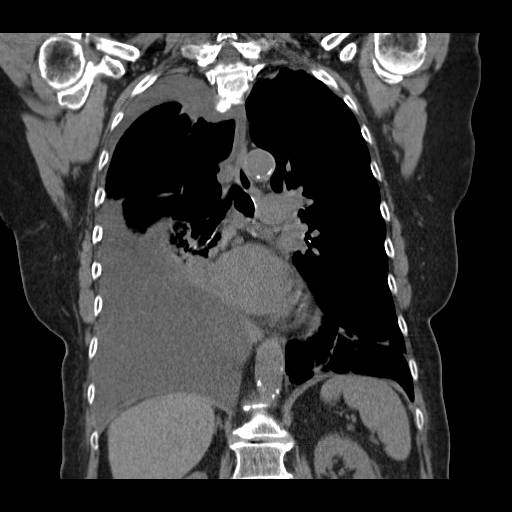

[17 of 46 positions shown; findings below may reference images not displayed]

FINDINGS: Moderate to large sized right pleural effusion.  Small
left pleural effusion.  Mild patchy interstitial prominence in both
lungs.  Compressive atelectasis in the right lung.  Minimal left
basilar atelectasis.  No lung masses or enlarged lymph nodes.
Diffuse low density of the blood relative to the arterial walls.
Atheromatous arterial calcifications, including the coronary
arteries.  Scattered colonic diverticula in the upper abdomen.
Thoracic and upper lumbar spine degenerative changes, including
changes of DISH.
IMPRESSION: 1.  Moderate to large-sized right pleural effusion.
2.  Small left pleural effusion.
3.  Compressive atelectasis in the right lung.
4.  Patchy interstitial prominence in both lungs.  Differential
considerations include interstitial pulmonary edema and
interstitial pneumonitis.
5.  Colonic diverticulosis.
6.  Atheromatous arterial calcifications, including the coronary
arteries.
7.  Evidence of anemia.

## 2014-10-14 IMAGING — CR DG CHEST 2V
2 series · 2 of 2 positions shown · non-contrast
Comparison: 04/15/2012

CLINICAL DATA: Follow up pleural effusion, hypoxia

CHEST - 2 VIEW

[w chest pa *]
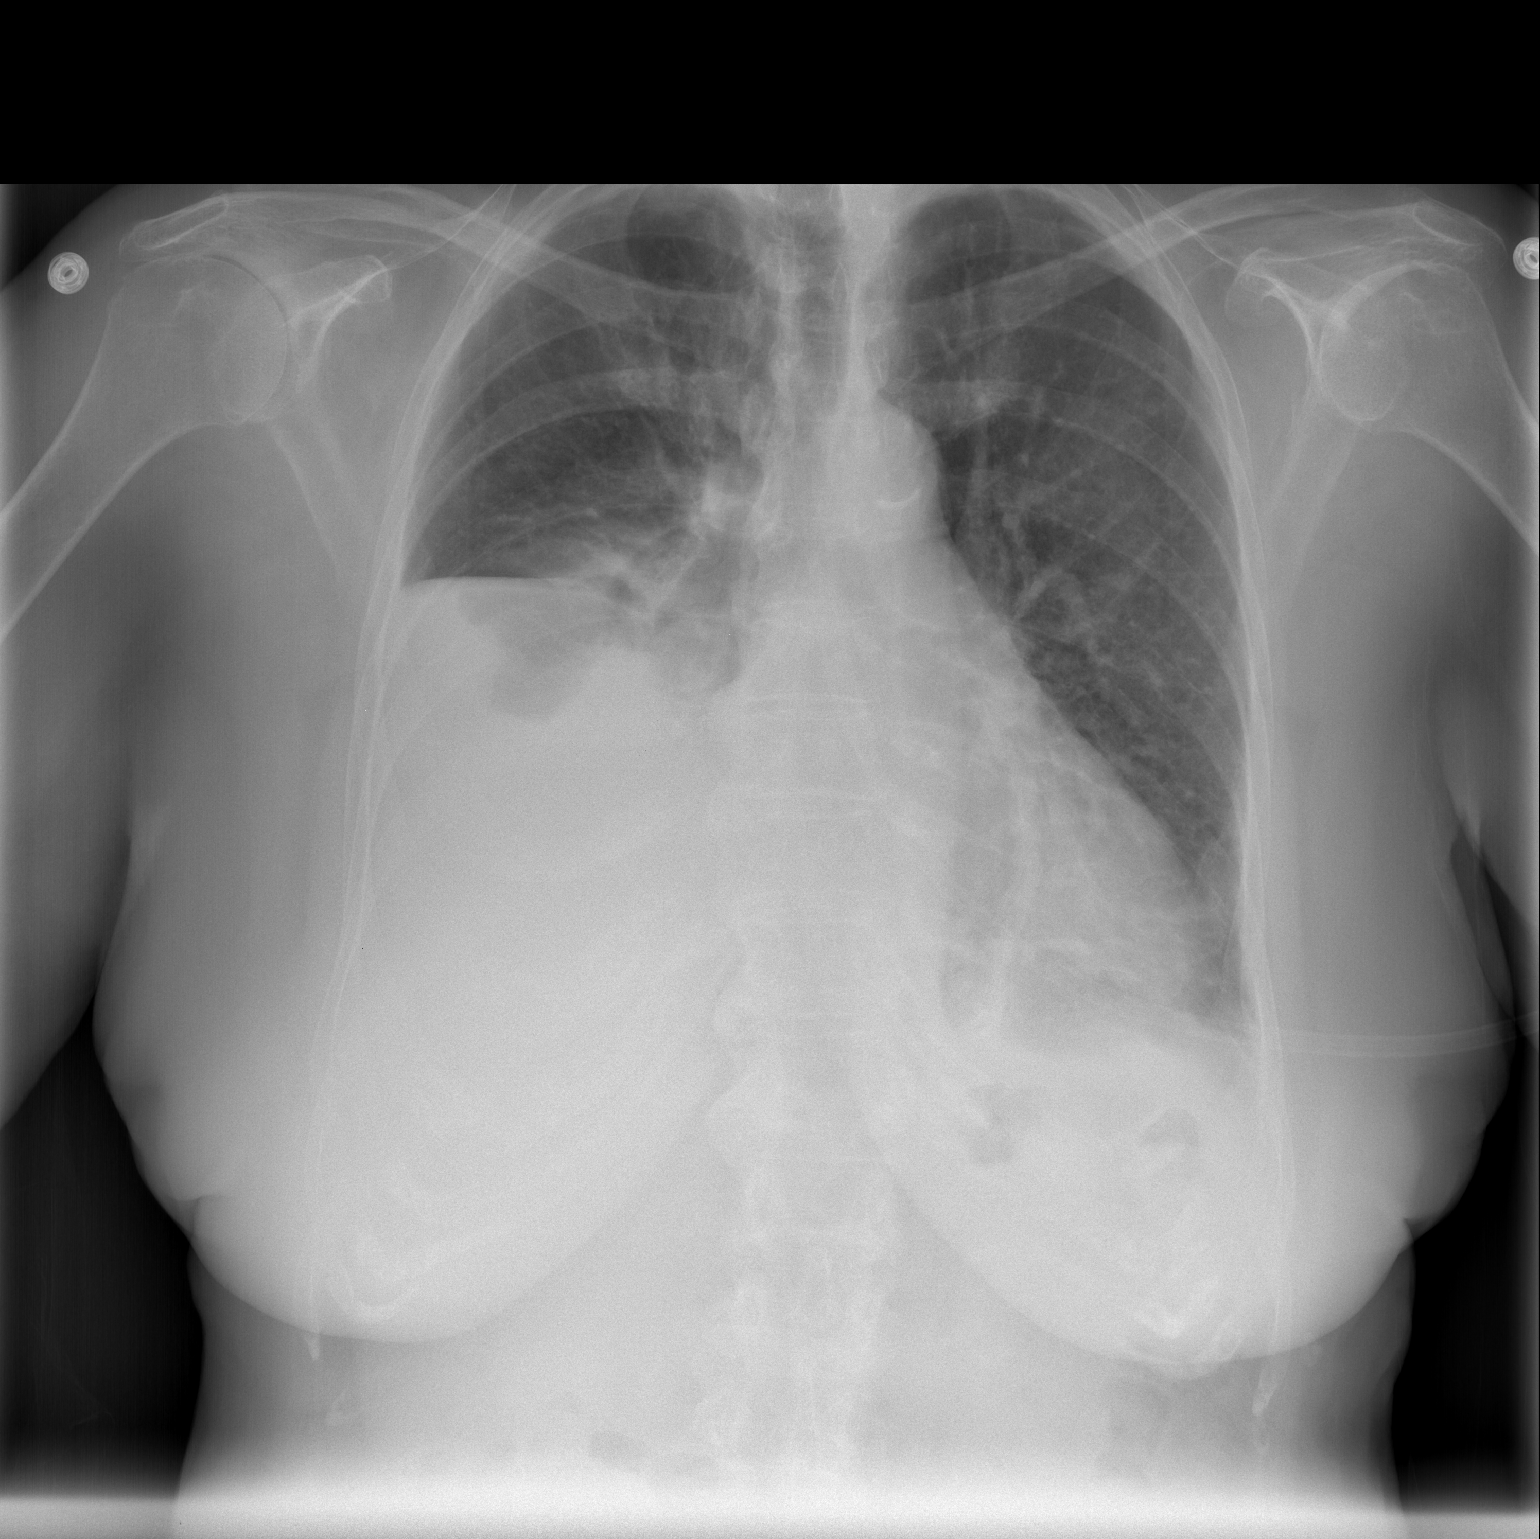

[w chest lat *]
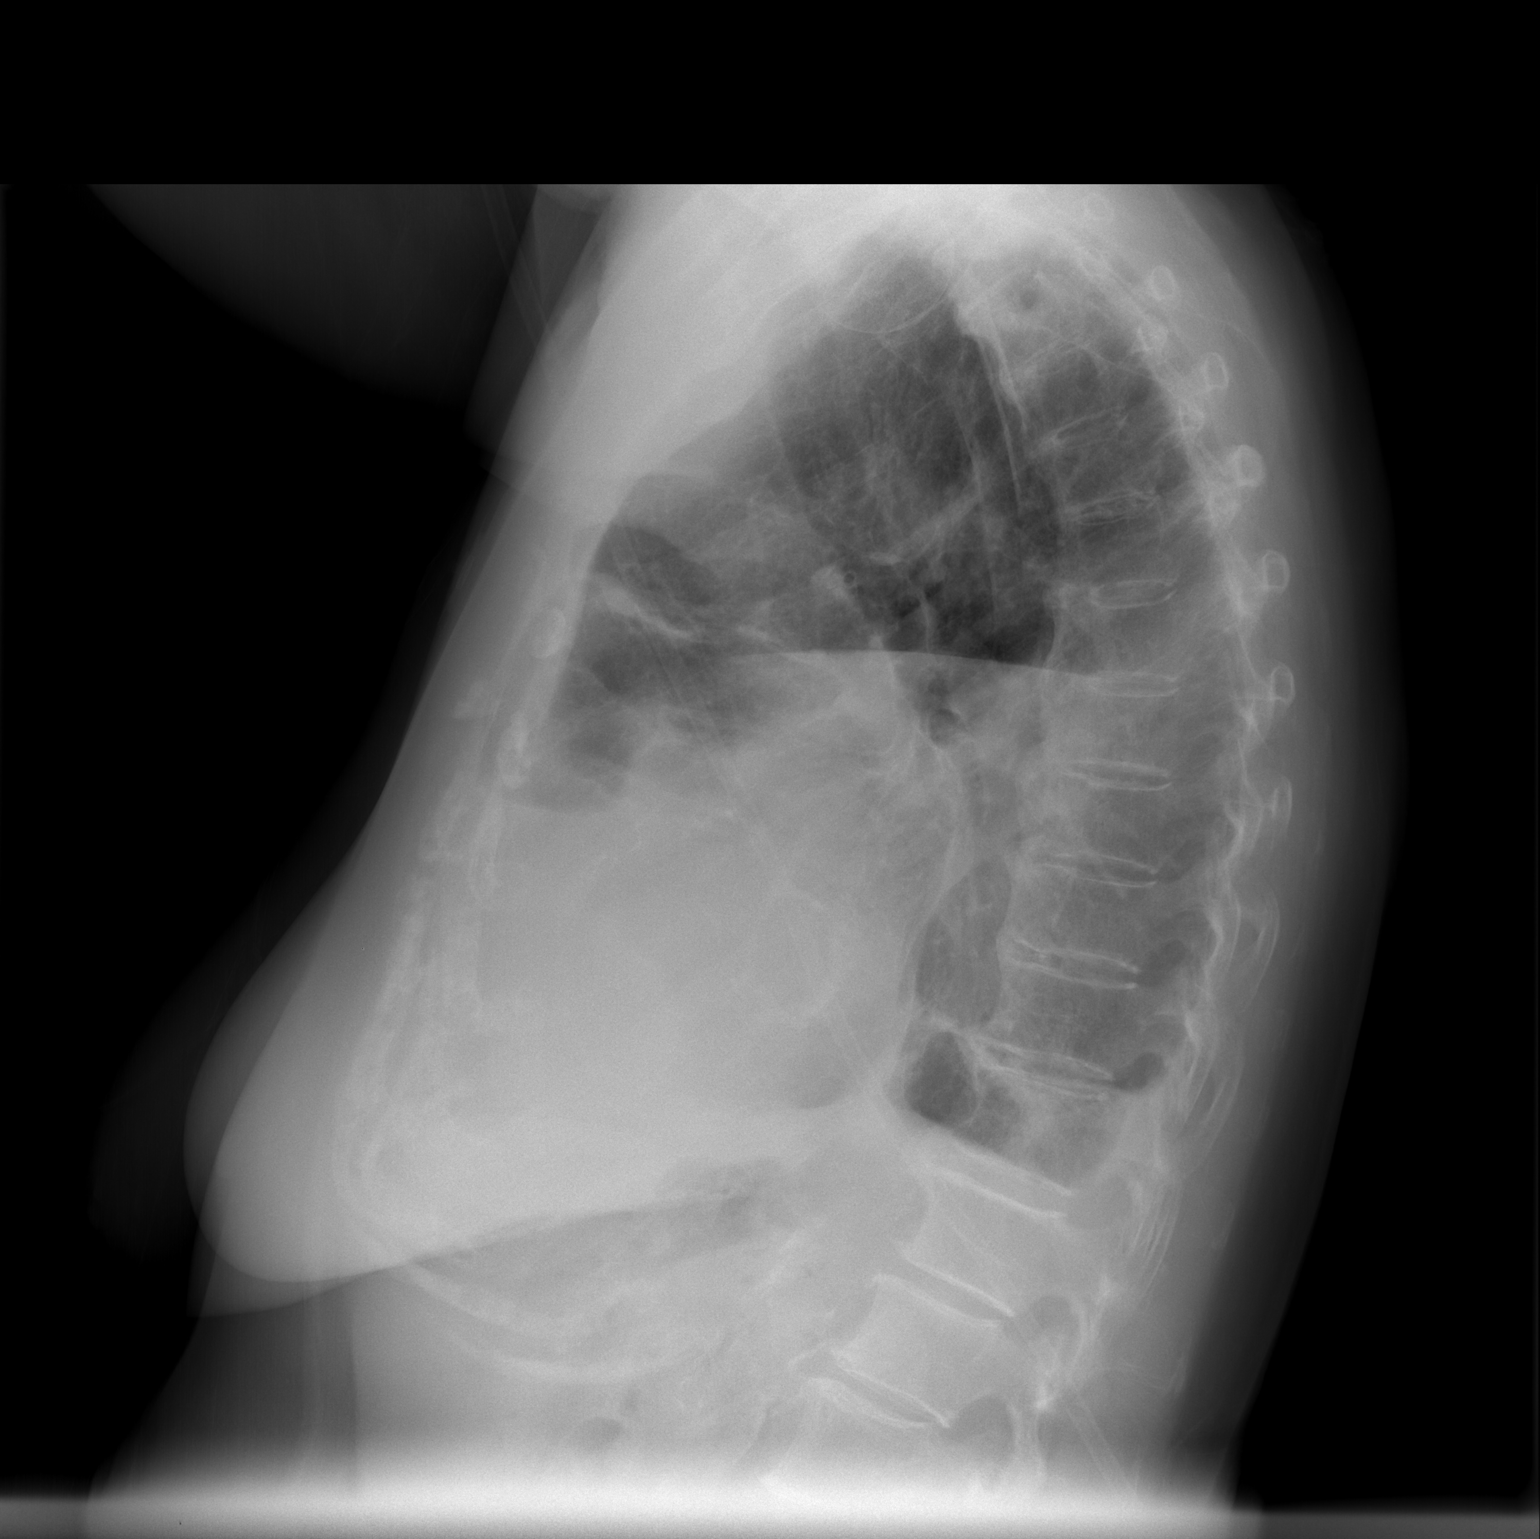

[2 of 2 positions shown; findings below may reference images not displayed]

FINDINGS: Enlargement of cardiac silhouette with pulmonary vascular
congestion.
Atherosclerotic calcification aorta.
Increase in right pleural effusion since previous exam.
Increased right basilar opacification likely atelectasis though
underlying mass and infiltrate not excluded.
Subsegmental atelectasis and tiny effusion at left lung base.
Underlying COPD.
Upper lungs clear.
No pneumothorax.
Scattered endplate spur formation thoracic spine.
IMPRESSION: Increased right pleural effusion and basilar atelectasis.
COPD with minimal left pleural effusion and subsegmental
atelectasis left lower lobe.

## 2014-10-15 IMAGING — CR DG CHEST 1V PORT
1 series · 1 of 1 positions shown · non-contrast
Comparison: 04/17/2012

CLINICAL DATA: Right pleural effusion.  Status post thoracentesis.

PORTABLE CHEST - 1 VIEW

[AP]
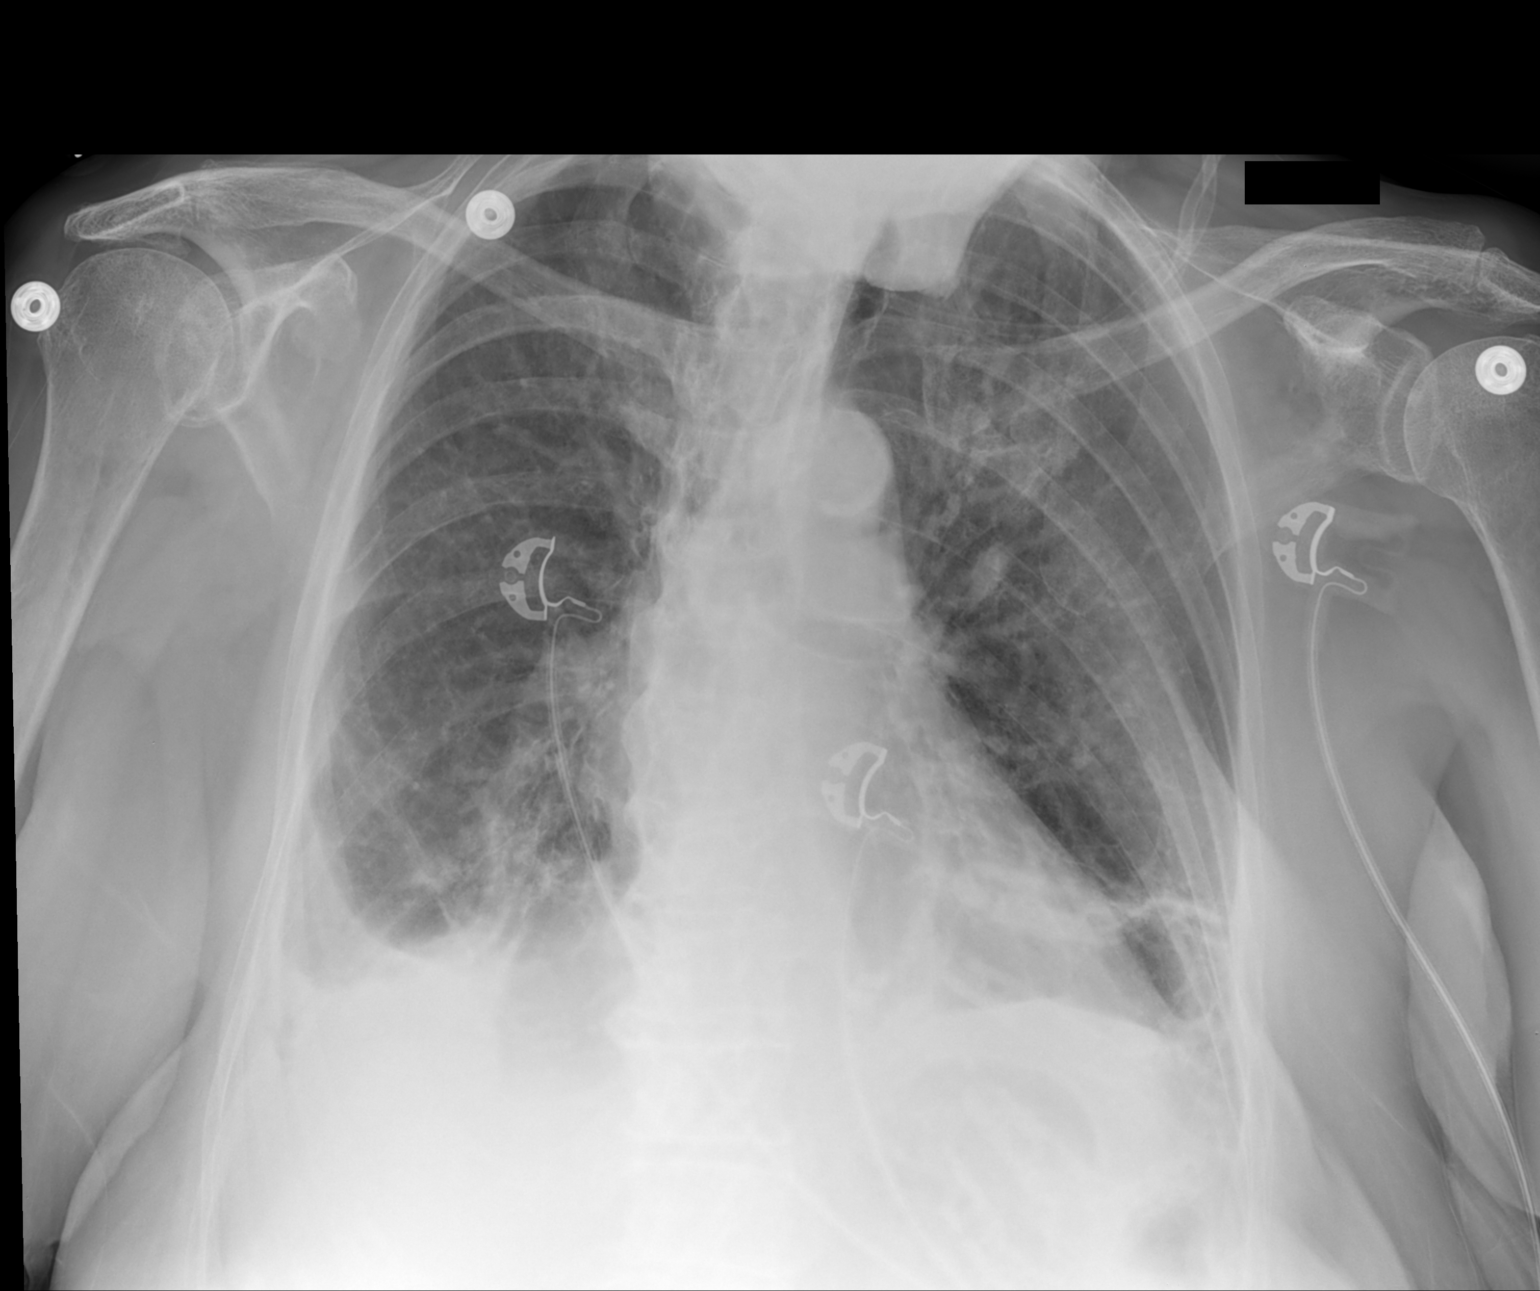

[1 of 1 positions shown; findings below may reference images not displayed]

FINDINGS: There has been marked reduction in the large right
pleural effusion with only a small residual effusion.  No
pneumothorax.  Marked improvement in the aeration of the right
lung.

There is new linear atelectasis at the left lung base.

Heart size is normal.  Slight pulmonary vascular prominence is new.
IMPRESSION: 1.  No pneumothorax after thoracentesis.  Small residual right
effusion.
2.  New linear atelectasis at the left lung base.
3.  New slight pulmonary vascular congestion.
